# Patient Record
Sex: Female | Born: 1950 | Race: Black or African American | Hispanic: No | Marital: Single | Smoking: Never smoker
Health system: Southern US, Community
[De-identification: ages and names within clinical notes are randomized; demographics above are authoritative.]

## PROBLEM LIST (undated history)

## (undated) DIAGNOSIS — N183 Chronic kidney disease, stage 3 unspecified: Secondary | ICD-10-CM

## (undated) DIAGNOSIS — J302 Other seasonal allergic rhinitis: Secondary | ICD-10-CM

## (undated) DIAGNOSIS — K529 Noninfective gastroenteritis and colitis, unspecified: Secondary | ICD-10-CM

## (undated) DIAGNOSIS — E669 Obesity, unspecified: Secondary | ICD-10-CM

## (undated) DIAGNOSIS — Z951 Presence of aortocoronary bypass graft: Secondary | ICD-10-CM

## (undated) DIAGNOSIS — I2510 Atherosclerotic heart disease of native coronary artery without angina pectoris: Secondary | ICD-10-CM

## (undated) DIAGNOSIS — E119 Type 2 diabetes mellitus without complications: Secondary | ICD-10-CM

## (undated) DIAGNOSIS — I10 Essential (primary) hypertension: Secondary | ICD-10-CM

## (undated) DIAGNOSIS — J45909 Unspecified asthma, uncomplicated: Secondary | ICD-10-CM

## (undated) DIAGNOSIS — I214 Non-ST elevation (NSTEMI) myocardial infarction: Secondary | ICD-10-CM

## (undated) DIAGNOSIS — E8881 Metabolic syndrome: Secondary | ICD-10-CM

## (undated) DIAGNOSIS — E785 Hyperlipidemia, unspecified: Secondary | ICD-10-CM

## (undated) DIAGNOSIS — Z794 Long term (current) use of insulin: Secondary | ICD-10-CM

## (undated) HISTORY — DX: Atherosclerotic heart disease of native coronary artery without angina pectoris: I25.10

## (undated) HISTORY — PX: TONSILLECTOMY: SUR1361

## (undated) HISTORY — DX: Non-ST elevation (NSTEMI) myocardial infarction: I21.4

## (undated) HISTORY — DX: Type 2 diabetes mellitus without complications: Z79.4

## (undated) HISTORY — DX: Type 2 diabetes mellitus without complications: E11.9

## (undated) HISTORY — DX: Essential (primary) hypertension: I10

## (undated) HISTORY — DX: Presence of aortocoronary bypass graft: Z95.1

## (undated) HISTORY — PX: CARDIAC SURGERY: SHX584

## (undated) HISTORY — DX: Obesity, unspecified: E66.9

## (undated) HISTORY — DX: Hyperlipidemia, unspecified: E78.5

## (undated) HISTORY — DX: Metabolic syndrome: E88.81

## (undated) HISTORY — PX: ABDOMINAL HYSTERECTOMY: SHX81

---

## 2009-03-16 ENCOUNTER — Emergency Department (HOSPITAL_COMMUNITY): Admission: EM | Admit: 2009-03-16 | Discharge: 2009-03-16 | Payer: Self-pay | Admitting: Family Medicine

## 2009-05-22 ENCOUNTER — Emergency Department (HOSPITAL_COMMUNITY): Admission: EM | Admit: 2009-05-22 | Discharge: 2009-05-22 | Payer: Self-pay | Admitting: Family Medicine

## 2009-06-17 ENCOUNTER — Emergency Department (HOSPITAL_COMMUNITY): Admission: EM | Admit: 2009-06-17 | Discharge: 2009-06-17 | Payer: Self-pay | Admitting: Emergency Medicine

## 2009-07-04 ENCOUNTER — Emergency Department (HOSPITAL_COMMUNITY): Admission: EM | Admit: 2009-07-04 | Discharge: 2009-07-04 | Payer: Self-pay | Admitting: Emergency Medicine

## 2009-10-01 ENCOUNTER — Emergency Department (HOSPITAL_COMMUNITY): Admission: EM | Admit: 2009-10-01 | Discharge: 2009-10-01 | Payer: Self-pay | Admitting: Emergency Medicine

## 2009-12-05 ENCOUNTER — Emergency Department (HOSPITAL_COMMUNITY): Admission: EM | Admit: 2009-12-05 | Discharge: 2009-12-05 | Payer: Self-pay | Admitting: Emergency Medicine

## 2009-12-29 ENCOUNTER — Emergency Department (HOSPITAL_COMMUNITY): Admission: EM | Admit: 2009-12-29 | Discharge: 2009-12-29 | Payer: Self-pay | Admitting: Family Medicine

## 2010-01-13 ENCOUNTER — Ambulatory Visit: Payer: Self-pay | Admitting: Internal Medicine

## 2010-01-15 ENCOUNTER — Ambulatory Visit: Payer: Self-pay | Admitting: Internal Medicine

## 2010-03-01 ENCOUNTER — Emergency Department (HOSPITAL_COMMUNITY): Admission: EM | Admit: 2010-03-01 | Discharge: 2010-03-01 | Payer: Self-pay | Admitting: Family Medicine

## 2010-04-09 HISTORY — PX: NM MYOCAR PERF WALL MOTION: HXRAD629

## 2010-09-12 ENCOUNTER — Emergency Department (HOSPITAL_COMMUNITY): Admission: EM | Admit: 2010-09-12 | Discharge: 2010-09-12 | Payer: Self-pay | Admitting: Family Medicine

## 2011-01-04 ENCOUNTER — Encounter: Payer: Self-pay | Admitting: Internal Medicine

## 2011-01-15 ENCOUNTER — Inpatient Hospital Stay (INDEPENDENT_AMBULATORY_CARE_PROVIDER_SITE_OTHER)
Admission: RE | Admit: 2011-01-15 | Discharge: 2011-01-15 | Disposition: A | Discharge status: Home / Self Care | Payer: BC Managed Care – PPO | Source: Ambulatory Visit | Attending: Family Medicine | Admitting: Family Medicine

## 2011-01-15 ENCOUNTER — Emergency Department (HOSPITAL_COMMUNITY)
Admission: EM | Admit: 2011-01-15 | Discharge: 2011-01-16 | Disposition: A | Discharge status: Home / Self Care | Payer: BC Managed Care – PPO | Attending: Emergency Medicine | Admitting: Emergency Medicine

## 2011-01-15 DIAGNOSIS — E119 Type 2 diabetes mellitus without complications: Secondary | ICD-10-CM | POA: Insufficient documentation

## 2011-01-15 DIAGNOSIS — R198 Other specified symptoms and signs involving the digestive system and abdomen: Secondary | ICD-10-CM

## 2011-01-15 DIAGNOSIS — I10 Essential (primary) hypertension: Secondary | ICD-10-CM | POA: Insufficient documentation

## 2011-01-15 DIAGNOSIS — K921 Melena: Secondary | ICD-10-CM | POA: Insufficient documentation

## 2011-01-15 DIAGNOSIS — K449 Diaphragmatic hernia without obstruction or gangrene: Secondary | ICD-10-CM | POA: Insufficient documentation

## 2011-01-15 DIAGNOSIS — E7800 Pure hypercholesterolemia, unspecified: Secondary | ICD-10-CM | POA: Insufficient documentation

## 2011-01-15 DIAGNOSIS — R197 Diarrhea, unspecified: Secondary | ICD-10-CM | POA: Insufficient documentation

## 2011-01-15 DIAGNOSIS — R109 Unspecified abdominal pain: Secondary | ICD-10-CM | POA: Insufficient documentation

## 2011-01-15 DIAGNOSIS — R110 Nausea: Secondary | ICD-10-CM | POA: Insufficient documentation

## 2011-01-15 DIAGNOSIS — Z79899 Other long term (current) drug therapy: Secondary | ICD-10-CM | POA: Insufficient documentation

## 2011-01-15 LAB — URINE MICROSCOPIC-ADD ON

## 2011-01-15 LAB — DIFFERENTIAL
Basophils Absolute: 0 10*3/uL (ref 0.0–0.1)
Lymphocytes Relative: 34 % (ref 12–46)
Monocytes Absolute: 1 10*3/uL (ref 0.1–1.0)
Neutro Abs: 7.1 10*3/uL (ref 1.7–7.7)
Neutrophils Relative %: 55 % (ref 43–77)

## 2011-01-15 LAB — URINALYSIS, ROUTINE W REFLEX MICROSCOPIC
Hgb urine dipstick: NEGATIVE
Protein, ur: NEGATIVE mg/dL
Specific Gravity, Urine: 1.022 (ref 1.005–1.030)
Urine Glucose, Fasting: NEGATIVE mg/dL

## 2011-01-15 LAB — CBC
HCT: 37.2 % (ref 36.0–46.0)
MCH: 31.9 pg (ref 26.0–34.0)
MCV: 95.6 fL (ref 78.0–100.0)
RBC: 3.89 MIL/uL (ref 3.87–5.11)
WBC: 12.9 10*3/uL — ABNORMAL HIGH (ref 4.0–10.5)

## 2011-01-16 ENCOUNTER — Emergency Department (HOSPITAL_COMMUNITY): Payer: BC Managed Care – PPO

## 2011-01-16 ENCOUNTER — Encounter (HOSPITAL_COMMUNITY): Payer: Self-pay | Admitting: Radiology

## 2011-01-16 LAB — COMPREHENSIVE METABOLIC PANEL
Albumin: 3.7 g/dL (ref 3.5–5.2)
Alkaline Phosphatase: 92 U/L (ref 39–117)
BUN: 14 mg/dL (ref 6–23)
CO2: 27 mEq/L (ref 19–32)
Chloride: 106 mEq/L (ref 96–112)
Creatinine, Ser: 1.18 mg/dL (ref 0.4–1.2)
GFR calc non Af Amer: 47 mL/min — ABNORMAL LOW (ref >60)
Glucose, Bld: 157 mg/dL — ABNORMAL HIGH (ref 70–99)
Potassium: 4.4 mEq/L (ref 3.5–5.1)
Total Bilirubin: 0.2 mg/dL — ABNORMAL LOW (ref 0.3–1.2)

## 2011-01-16 LAB — LIPASE, BLOOD: Lipase: 36 U/L (ref 11–59)

## 2011-01-16 MED ORDER — IOHEXOL 300 MG/ML  SOLN
100.0000 mL | Freq: Once | INTRAMUSCULAR | Status: AC | PRN
  Administered 2011-01-16: 100 mL via INTRAVENOUS

## 2011-02-26 LAB — POCT URINALYSIS DIPSTICK
Bilirubin Urine: NEGATIVE
Glucose, UA: NEGATIVE mg/dL
Hgb urine dipstick: NEGATIVE
Nitrite: NEGATIVE
Urobilinogen, UA: 0.2 mg/dL (ref 0.0–1.0)

## 2011-03-23 LAB — POCT URINALYSIS DIP (DEVICE)
Glucose, UA: NEGATIVE mg/dL
Hgb urine dipstick: NEGATIVE
Nitrite: NEGATIVE
Urobilinogen, UA: 0.2 mg/dL (ref 0.0–1.0)

## 2011-05-06 ENCOUNTER — Other Ambulatory Visit: Payer: Self-pay | Admitting: Internal Medicine

## 2011-05-06 DIAGNOSIS — Z1231 Encounter for screening mammogram for malignant neoplasm of breast: Secondary | ICD-10-CM

## 2011-05-13 ENCOUNTER — Other Ambulatory Visit (HOSPITAL_COMMUNITY): Payer: Self-pay | Admitting: Orthopedic Surgery

## 2011-05-13 DIAGNOSIS — M25512 Pain in left shoulder: Secondary | ICD-10-CM

## 2011-05-17 ENCOUNTER — Inpatient Hospital Stay (INDEPENDENT_AMBULATORY_CARE_PROVIDER_SITE_OTHER)
Admission: RE | Admit: 2011-05-17 | Discharge: 2011-05-17 | Disposition: A | Discharge status: Home / Self Care | Payer: BC Managed Care – PPO | Source: Ambulatory Visit | Attending: Emergency Medicine | Admitting: Emergency Medicine

## 2011-05-17 DIAGNOSIS — J069 Acute upper respiratory infection, unspecified: Secondary | ICD-10-CM

## 2011-05-22 ENCOUNTER — Other Ambulatory Visit (HOSPITAL_COMMUNITY): Payer: BC Managed Care – PPO

## 2011-05-22 ENCOUNTER — Encounter (HOSPITAL_COMMUNITY): Payer: BC Managed Care – PPO

## 2011-05-26 ENCOUNTER — Ambulatory Visit: Payer: BC Managed Care – PPO

## 2011-05-26 ENCOUNTER — Ambulatory Visit (HOSPITAL_COMMUNITY)
Admission: RE | Admit: 2011-05-26 | Discharge: 2011-05-26 | Disposition: A | Discharge status: Home / Self Care | Payer: BC Managed Care – PPO | Source: Ambulatory Visit | Attending: Orthopedic Surgery | Admitting: Orthopedic Surgery

## 2011-05-26 ENCOUNTER — Encounter (HOSPITAL_COMMUNITY)
Admission: RE | Admit: 2011-05-26 | Discharge: 2011-05-26 | Disposition: A | Discharge status: Home / Self Care | Payer: BC Managed Care – PPO | Source: Ambulatory Visit | Attending: Orthopedic Surgery | Admitting: Orthopedic Surgery

## 2011-05-26 DIAGNOSIS — M25512 Pain in left shoulder: Secondary | ICD-10-CM

## 2011-05-26 DIAGNOSIS — M25519 Pain in unspecified shoulder: Secondary | ICD-10-CM | POA: Insufficient documentation

## 2011-05-26 MED ORDER — TECHNETIUM TC 99M MEDRONATE IV KIT
25.0000 | PACK | Freq: Once | INTRAVENOUS | Status: AC | PRN
  Administered 2011-05-26: 25 via INTRAVENOUS

## 2011-05-29 ENCOUNTER — Ambulatory Visit
Admission: RE | Admit: 2011-05-29 | Discharge: 2011-05-29 | Disposition: A | Discharge status: Home / Self Care | Payer: BC Managed Care – PPO | Source: Ambulatory Visit | Attending: Internal Medicine | Admitting: Internal Medicine

## 2011-05-29 ENCOUNTER — Ambulatory Visit: Payer: BC Managed Care – PPO

## 2011-05-29 DIAGNOSIS — Z1231 Encounter for screening mammogram for malignant neoplasm of breast: Secondary | ICD-10-CM

## 2012-05-05 ENCOUNTER — Other Ambulatory Visit: Payer: Self-pay | Admitting: Internal Medicine

## 2012-05-05 DIAGNOSIS — Z1231 Encounter for screening mammogram for malignant neoplasm of breast: Secondary | ICD-10-CM

## 2012-06-01 ENCOUNTER — Ambulatory Visit
Admission: RE | Admit: 2012-06-01 | Discharge: 2012-06-01 | Disposition: A | Discharge status: Home / Self Care | Payer: BC Managed Care – PPO | Source: Ambulatory Visit | Attending: Internal Medicine | Admitting: Internal Medicine

## 2012-06-01 DIAGNOSIS — Z1231 Encounter for screening mammogram for malignant neoplasm of breast: Secondary | ICD-10-CM

## 2013-01-14 ENCOUNTER — Emergency Department (HOSPITAL_COMMUNITY)
Admission: EM | Admit: 2013-01-14 | Discharge: 2013-01-14 | Disposition: A | Discharge status: Home / Self Care | Payer: BC Managed Care – PPO | Attending: Emergency Medicine | Admitting: Emergency Medicine

## 2013-01-14 ENCOUNTER — Encounter (HOSPITAL_COMMUNITY): Payer: Self-pay | Admitting: *Deleted

## 2013-01-14 DIAGNOSIS — R21 Rash and other nonspecific skin eruption: Secondary | ICD-10-CM | POA: Insufficient documentation

## 2013-01-14 DIAGNOSIS — E119 Type 2 diabetes mellitus without complications: Secondary | ICD-10-CM | POA: Insufficient documentation

## 2013-01-14 DIAGNOSIS — L299 Pruritus, unspecified: Secondary | ICD-10-CM | POA: Insufficient documentation

## 2013-01-14 DIAGNOSIS — R0602 Shortness of breath: Secondary | ICD-10-CM | POA: Insufficient documentation

## 2013-01-14 DIAGNOSIS — T4995XA Adverse effect of unspecified topical agent, initial encounter: Secondary | ICD-10-CM | POA: Insufficient documentation

## 2013-01-14 DIAGNOSIS — Z862 Personal history of diseases of the blood and blood-forming organs and certain disorders involving the immune mechanism: Secondary | ICD-10-CM | POA: Insufficient documentation

## 2013-01-14 DIAGNOSIS — I10 Essential (primary) hypertension: Secondary | ICD-10-CM | POA: Insufficient documentation

## 2013-01-14 DIAGNOSIS — Z8639 Personal history of other endocrine, nutritional and metabolic disease: Secondary | ICD-10-CM | POA: Insufficient documentation

## 2013-01-14 DIAGNOSIS — Z90710 Acquired absence of both cervix and uterus: Secondary | ICD-10-CM | POA: Insufficient documentation

## 2013-01-14 DIAGNOSIS — T783XXA Angioneurotic edema, initial encounter: Secondary | ICD-10-CM | POA: Insufficient documentation

## 2013-01-14 DIAGNOSIS — T7840XA Allergy, unspecified, initial encounter: Secondary | ICD-10-CM | POA: Insufficient documentation

## 2013-01-14 MED ORDER — EPINEPHRINE HCL 1 MG/ML IJ SOLN
1.0000 mg | Freq: Once | INTRAMUSCULAR | Status: AC
  Administered 2013-01-14: 1 mg via INTRAMUSCULAR
  Filled 2013-01-14: qty 1

## 2013-01-14 MED ORDER — DIPHENHYDRAMINE HCL 25 MG PO TABS: 25.0000 mg | ORAL_TABLET | Freq: Four times a day (QID) | ORAL | Status: DC

## 2013-01-14 MED ORDER — DIPHENHYDRAMINE HCL 50 MG/ML IJ SOLN
50.0000 mg | Freq: Once | INTRAMUSCULAR | Status: AC
Start: 2013-01-14 — End: 2013-01-14
  Administered 2013-01-14: 50 mg via INTRAVENOUS
  Filled 2013-01-14: qty 1

## 2013-01-14 MED ORDER — PREDNISONE 20 MG PO TABS: 60.0000 mg | ORAL_TABLET | Freq: Every day | ORAL | Status: DC

## 2013-01-14 MED ORDER — FAMOTIDINE 20 MG PO TABS: 20.0000 mg | ORAL_TABLET | Freq: Two times a day (BID) | ORAL | Status: DC

## 2013-01-14 MED ORDER — METHYLPREDNISOLONE SODIUM SUCC 125 MG IJ SOLR
125.0000 mg | Freq: Once | INTRAMUSCULAR | Status: AC
  Administered 2013-01-14: 125 mg via INTRAVENOUS
  Filled 2013-01-14: qty 2

## 2013-01-14 MED ORDER — EPINEPHRINE 0.3 MG/0.3ML IJ DEVI: 0.3000 mg | INTRAMUSCULAR | Status: DC | PRN

## 2013-01-14 MED ORDER — FAMOTIDINE IN NACL 20-0.9 MG/50ML-% IV SOLN
20.0000 mg | Freq: Once | INTRAVENOUS | Status: AC
  Administered 2013-01-14: 20 mg via INTRAVENOUS
  Filled 2013-01-14: qty 50

## 2013-01-14 NOTE — ED Notes (Signed)
Pt. Swelling to her face is significantly reduced from when she arrived. She was able to drink a ginger ale without problems swallowing. She does still have a small amount of angioedema noted to her lower lip but markedly improved from when she arrived.

## 2013-01-14 NOTE — ED Notes (Addendum)
C/o sudden onset of sob around 1600, gradual progressive L sided mouth swelling around 2000. Also c/o rash and itching. Hives noted. Alert, NAD, calm, interactive, speech altered d/t swelling. LS CTA. Difficult to visualize oropahynx/uvula. Cap refill <3sec. No benadryl PTA. Did use inhaler PTA. Husband in w/r. Takes lisinopril.

## 2013-01-14 NOTE — ED Notes (Signed)
Pt. Has noted angioedema to her face and lips. States she feels like her throat might be tightening up on her. She has a red raised rash on both forearms, her neck, her chest, back and her genitalia. She states it itches. She denies using anything new or using anything that she would be allergic too.

## 2013-01-14 NOTE — ED Provider Notes (Addendum)
History     CSN: [id number]  Arrival date & time 01/14/13  0006   First MD Initiated Contact with Patient 01/14/13 0020      Chief Complaint  Patient presents with  . Angioedema  . Shortness of Breath  . Pruritis    (Consider location/radiation/quality/duration/timing/severity/associated sxs/prior treatment) HPI Pt brought to the ED by husband, she reports onset of itching rash to arms and chest about 5 hours prior to arrival which progressed to include swelling of her L upper lip, lower lip and tongue. She denies any SOB. No new exposures to meds, soaps, lotions or foods. She thinks she had a milder reaction to mushrooms previously, but has not had any mushrooms today.  Past Medical History  Diagnosis Date  . Diabetes mellitus   . Hypertension   . Hypercholesterolemia     Past Surgical History  Procedure Date  . Abdominal hysterectomy     No family history on file.  History  Substance Use Topics  . Smoking status: Never Smoker   . Smokeless tobacco: Not on file  . Alcohol Use: No    OB History    Grav Para Term Preterm Abortions TAB SAB Ect Mult Living                  Review of Systems All other systems reviewed and are negative except as noted in HPI.   Allergies  Tetanus toxoids  Home Medications  No current outpatient prescriptions on file.  BP 157/86  Pulse 134  Resp 20  SpO2 97%  Physical Exam  Nursing note and vitals reviewed. Constitutional: She is oriented to person, place, and time. She appears well-developed and well-nourished.  HENT:  Head: Normocephalic and atraumatic.       Angioedema of the L upper and lower lips and L tongue  Eyes: EOM are normal. Pupils are equal, round, and reactive to light.  Neck: Normal range of motion. Neck supple.  Cardiovascular: Normal rate, normal heart sounds and intact distal pulses.   Pulmonary/Chest: Effort normal and breath sounds normal. No stridor.  Abdominal: Bowel sounds are normal. She exhibits  no distension. There is no tenderness.  Musculoskeletal: Normal range of motion. She exhibits no edema and no tenderness.  Neurological: She is alert and oriented to person, place, and time. She has normal strength. No cranial nerve deficit or sensory deficit.  Skin: Skin is warm and dry. Rash (hives to arms and chest) noted.  Psychiatric: She has a normal mood and affect.    ED Course  Procedures (including critical care time)  Labs Reviewed - No data to display No results found.   No diagnosis found.    MDM  Allergic reaction to unknown allergen. Doubt this is due to lisinopril due to presence of cutaneous reaction. Will give IV meds and EpiPen   2:40 AM Pt sleeping comfortably. Still having some angioedema of lips, but no stridor and improved hives. Will continue to monitor.    4:52 AM Angioedema and rash improved, BP and SpO2 remain normal 4hrs post-EpiPen. No stridor Pt feeling better and is ready to go home.   CRITICAL CARE Performed by: Pollyann Savoy   Total critical care time: 45  Critical care time was exclusive of separately billable procedures and treating other patients.  Critical care was necessary to treat or prevent imminent or life-threatening deterioration.  Critical care was time spent personally by me on the following activities: development of treatment plan with patient and/or surrogate as  well as nursing, discussions with consultants, evaluation of patient's response to treatment, examination of patient, obtaining history from patient or surrogate, ordering and performing treatments and interventions, ordering and review of laboratory studies, ordering and review of radiographic studies, pulse oximetry and re-evaluation of patient's condition.   Charles B. Bernette Mayers, MD 01/14/13 [phone]

## 2013-01-15 ENCOUNTER — Inpatient Hospital Stay (HOSPITAL_COMMUNITY)
Admission: EM | Admit: 2013-01-15 | Discharge: 2013-01-18 | DRG: 582 | Disposition: A | Discharge status: Home / Self Care | Payer: BC Managed Care – PPO | Attending: Internal Medicine | Admitting: Internal Medicine

## 2013-01-15 ENCOUNTER — Encounter (HOSPITAL_COMMUNITY): Payer: Self-pay | Admitting: Emergency Medicine

## 2013-01-15 DIAGNOSIS — N289 Disorder of kidney and ureter, unspecified: Secondary | ICD-10-CM | POA: Diagnosis present

## 2013-01-15 DIAGNOSIS — J309 Allergic rhinitis, unspecified: Secondary | ICD-10-CM | POA: Diagnosis present

## 2013-01-15 DIAGNOSIS — T783XXA Angioneurotic edema, initial encounter: Secondary | ICD-10-CM

## 2013-01-15 DIAGNOSIS — Z888 Allergy status to other drugs, medicaments and biological substances status: Secondary | ICD-10-CM

## 2013-01-15 DIAGNOSIS — Z79899 Other long term (current) drug therapy: Secondary | ICD-10-CM

## 2013-01-15 DIAGNOSIS — I10 Essential (primary) hypertension: Secondary | ICD-10-CM | POA: Diagnosis present

## 2013-01-15 DIAGNOSIS — Z90710 Acquired absence of both cervix and uterus: Secondary | ICD-10-CM

## 2013-01-15 DIAGNOSIS — E785 Hyperlipidemia, unspecified: Secondary | ICD-10-CM | POA: Diagnosis present

## 2013-01-15 DIAGNOSIS — I9589 Other hypotension: Secondary | ICD-10-CM | POA: Diagnosis present

## 2013-01-15 DIAGNOSIS — N179 Acute kidney failure, unspecified: Secondary | ICD-10-CM | POA: Diagnosis present

## 2013-01-15 DIAGNOSIS — IMO0001 Reserved for inherently not codable concepts without codable children: Secondary | ICD-10-CM | POA: Diagnosis present

## 2013-01-15 DIAGNOSIS — E1165 Type 2 diabetes mellitus with hyperglycemia: Secondary | ICD-10-CM | POA: Diagnosis present

## 2013-01-15 DIAGNOSIS — Y92009 Unspecified place in unspecified non-institutional (private) residence as the place of occurrence of the external cause: Secondary | ICD-10-CM

## 2013-01-15 DIAGNOSIS — IMO0002 Reserved for concepts with insufficient information to code with codable children: Secondary | ICD-10-CM | POA: Diagnosis present

## 2013-01-15 DIAGNOSIS — I959 Hypotension, unspecified: Secondary | ICD-10-CM | POA: Diagnosis present

## 2013-01-15 DIAGNOSIS — D649 Anemia, unspecified: Secondary | ICD-10-CM | POA: Diagnosis present

## 2013-01-15 DIAGNOSIS — Z887 Allergy status to serum and vaccine status: Secondary | ICD-10-CM

## 2013-01-15 DIAGNOSIS — E860 Dehydration: Secondary | ICD-10-CM | POA: Diagnosis present

## 2013-01-15 DIAGNOSIS — J302 Other seasonal allergic rhinitis: Secondary | ICD-10-CM | POA: Insufficient documentation

## 2013-01-15 DIAGNOSIS — T465X5A Adverse effect of other antihypertensive drugs, initial encounter: Secondary | ICD-10-CM | POA: Diagnosis present

## 2013-01-15 DIAGNOSIS — T886XXA Anaphylactic reaction due to adverse effect of correct drug or medicament properly administered, initial encounter: Secondary | ICD-10-CM | POA: Diagnosis present

## 2013-01-15 DIAGNOSIS — Z23 Encounter for immunization: Secondary | ICD-10-CM

## 2013-01-15 HISTORY — DX: Other seasonal allergic rhinitis: J30.2

## 2013-01-15 LAB — CBC WITH DIFFERENTIAL/PLATELET
Basophils Absolute: 0 10*3/uL (ref 0.0–0.1)
Eosinophils Absolute: 0.1 10*3/uL (ref 0.0–0.7)
Eosinophils Relative: 1 % (ref 0–5)
Lymphocytes Relative: 30 % (ref 12–46)
MCH: 32.3 pg (ref 26.0–34.0)
MCV: 95.5 fL (ref 78.0–100.0)
Neutrophils Relative %: 65 % (ref 43–77)
Platelets: 261 10*3/uL (ref 150–400)
RDW: 12.9 % (ref 11.5–15.5)
WBC: 12 10*3/uL — ABNORMAL HIGH (ref 4.0–10.5)

## 2013-01-15 LAB — COMPREHENSIVE METABOLIC PANEL
ALT: 23 U/L (ref 0–35)
AST: 26 U/L (ref 0–37)
Calcium: 8.8 mg/dL (ref 8.4–10.5)
Sodium: 134 mEq/L — ABNORMAL LOW (ref 135–145)
Total Protein: 6.4 g/dL (ref 6.0–8.3)

## 2013-01-15 MED ORDER — DIPHENHYDRAMINE HCL 12.5 MG/5ML PO ELIX
50.0000 mg | ORAL_SOLUTION | Freq: Once | ORAL | Status: AC
  Administered 2013-01-15: 50 mg via ORAL
  Filled 2013-01-15: qty 20

## 2013-01-15 MED ORDER — SODIUM CHLORIDE 0.9 % IV BOLUS (SEPSIS)
2000.0000 mL | Freq: Once | INTRAVENOUS | Status: AC
  Administered 2013-01-15: 2000 mL via INTRAVENOUS

## 2013-01-15 MED ORDER — PREDNISONE 20 MG PO TABS
60.0000 mg | ORAL_TABLET | Freq: Once | ORAL | Status: AC
  Administered 2013-01-15: 60 mg via ORAL
  Filled 2013-01-15: qty 3

## 2013-01-15 NOTE — ED Notes (Addendum)
Patient reports that she was seen here on Friday for rash and swelling; patient was told that she was probably having an allergic reaction to Lisinopril.  Large red rashes noted to arms; patient reports that rash is all over her body and is really itchy.  Patient also complaining of headache only while coughing.  Patient reports clear sputum production.  Patient reports that she has not been taking her prescriptions prescribed from last visit on regular basis.  HR in 130's in triage; blood pressure 85/59.  Patient denies chest pain, shortness of breath, dizziness, and weakness.

## 2013-01-15 NOTE — ED Notes (Signed)
Blood pressure rechecked three times; systolic pressure between 70-90 with all readings; EDP notified.

## 2013-01-15 NOTE — ED Notes (Signed)
Pt has visible rash all over body red in color and pt is hot to touch. Pt states she was here Friday for the same rash and it has gotten worse. Pt denies sob. C/o severe itching.

## 2013-01-15 NOTE — ED Provider Notes (Addendum)
History     CSN: [id number]  Arrival date & time 01/15/13  2134   First MD Initiated Contact with Patient 01/15/13 2235      Chief Complaint  Patient presents with  . Rash  . Cough    (Consider location/radiation/quality/duration/timing/severity/associated sxs/prior treatment) HPI Presents with diffuse swelling, rash, and cough generalized weakness onset 2 days ago. Patient reports that 2 days ago her tongue and lip were swollen.. Seen here 01/14/2013. Treated with intravenous medication and epinephrine. Observed and discharged home. Continued to take lisinopril the following day. Rash is worsening. Now is more pronounced on trunk and extremities. No longer has swelling of tongue or lips Denies other complaint. No further treatment at home. Past Medical History  Diagnosis Date  . Diabetes mellitus   . Hypertension   . Hypercholesterolemia   . Seasonal allergies     Past Surgical History  Procedure Date  . Abdominal hysterectomy     History reviewed. No pertinent family history.  History  Substance Use Topics  . Smoking status: Never Smoker   . Smokeless tobacco: Not on file  . Alcohol Use: No    OB History    Grav Para Term Preterm Abortions TAB SAB Ect Mult Living                  Review of Systems  HENT: Negative.   Respiratory: Positive for cough.   Cardiovascular: Negative.   Gastrointestinal: Negative.   Musculoskeletal: Negative.   Skin: Positive for rash.  Neurological: Positive for weakness.  Hematological: Negative.   Psychiatric/Behavioral: Negative.   All other systems reviewed and are negative.    Allergies  Mushroom extract complex and Tetanus toxoids  Home Medications   Current Outpatient Rx  Name  Route  Sig  Dispense  Refill  . ALBUTEROL SULFATE HFA 108 (90 BASE) MCG/ACT IN AERS   Inhalation   Inhale 2 puffs into the lungs every 6 (six) hours as needed. For shortness of breath or wheezing         . ADVAIR DISKUS IN    Inhalation   Inhale 1 puff into the lungs daily as needed. For wheezing         . GLIPIZIDE 10 MG PO TABS   Oral   Take 10 mg by mouth 2 (two) times daily before a meal.         . LISINOPRIL 20 MG PO TABS   Oral   Take 20 mg by mouth every evening.         [name] Kitchen LORATADINE 10 MG PO TABS   Oral   Take 10 mg by mouth daily.         [name] Kitchen METFORMIN HCL 500 MG PO TABS   Oral   Take 500 mg by mouth 2 (two) times daily with a meal.         . ADULT MULTIVITAMIN W/MINERALS CH   Oral   Take 1 tablet by mouth daily.         [name] Kitchen OVER THE COUNTER MEDICATION   Oral   Take 1 tablet by mouth daily. Ordered online - medicine to speed up metabolism         . SIMVASTATIN 10 MG PO TABS   Oral   Take 10 mg by mouth at bedtime.         [name] Kitchen PREDNISONE 20 MG PO TABS   Oral   Take 3 tablets (60 mg total) by mouth daily.   15 tablet  0     BP 85/59  Pulse 120  Temp 99.3 F (37.4 C) (Oral)  Resp 20  SpO2 95%  Physical Exam  Nursing note and vitals reviewed. Constitutional: She appears well-developed and well-nourished.  HENT:  Head: Normocephalic and atraumatic.  Right Ear: External ear normal.  Left Ear: External ear normal.       No swelling of tongue lips or uvula. No hoarseness  Eyes: Conjunctivae normal are normal. Pupils are equal, round, and reactive to light.  Neck: Neck supple. No tracheal deviation present. No thyromegaly present.  Cardiovascular: Normal rate and regular rhythm.   No murmur heard. Pulmonary/Chest: Effort normal and breath sounds normal.  Abdominal: Soft. Bowel sounds are normal. She exhibits no distension. There is no tenderness.       Obese  Musculoskeletal: Normal range of motion. She exhibits no edema and no tenderness.  Neurological: She is alert. Coordination normal.  Skin: Skin is warm and dry. Rash noted.       Hive-like rash on trunk and extremities.  Psychiatric: She has a normal mood and affect.    ED Course  Procedures (including  critical care time)   Labs Reviewed  CBC WITH DIFFERENTIAL  COMPREHENSIVE METABOLIC PANEL   No results found.   No diagnosis found.    Date: 01/15/2013  Rate: 130  Rhythm: sinus tachycardia  QRS Axis: left  Intervals: normal  ST/T Wave abnormalities: nonspecific T wave changes  Conduction Disutrbances:none  Narrative Interpretation:   Old EKG Reviewed: none available 12:40 AM patient remained hypotensive after treatment with 2 liters normal saline iv. She is itching diffusely after treatment prednisone and Benadryl. She is alert appropriate  Results for orders placed during the hospital encounter of 01/15/13  CBC WITH DIFFERENTIAL      Component Value Range   WBC 12.0 (*) 4.0 - 10.5 K/uL   RBC 4.64  3.87 - 5.11 MIL/uL   Hemoglobin 15.0  12.0 - 15.0 g/dL   HCT 09.8  11.9 - 14.7 %   MCV 95.5  78.0 - 100.0 fL   MCH 32.3  26.0 - 34.0 pg   MCHC 33.9  30.0 - 36.0 g/dL   RDW 82.9  56.2 - 13.0 %   Platelets 261  150 - 400 K/uL   Neutrophils Relative 65  43 - 77 %   Neutro Abs 7.8 (*) 1.7 - 7.7 K/uL   Lymphocytes Relative 30  12 - 46 %   Lymphs Abs 3.6  0.7 - 4.0 K/uL   Monocytes Relative 4  3 - 12 %   Monocytes Absolute 0.5  0.1 - 1.0 K/uL   Eosinophils Relative 1  0 - 5 %   Eosinophils Absolute 0.1  0.0 - 0.7 K/uL   Basophils Relative 0  0 - 1 %   Basophils Absolute 0.0  0.0 - 0.1 K/uL  COMPREHENSIVE METABOLIC PANEL      Component Value Range   Sodium 134 (*) 135 - 145 mEq/L   Potassium 4.0  3.5 - 5.1 mEq/L   Chloride 104  96 - 112 mEq/L   CO2 16 (*) 19 - 32 mEq/L   Glucose, Bld 351 (*) 70 - 99 mg/dL   BUN 27 (*) 6 - 23 mg/dL   Creatinine, Ser 8.65 (*) 0.50 - 1.10 mg/dL   Calcium 8.8  8.4 - 78.4 mg/dL   Total Protein 6.4  6.0 - 8.3 g/dL   Albumin 3.3 (*) 3.5 - 5.2 g/dL   AST  26  0 - 37 U/L   ALT 23  0 - 35 U/L   Alkaline Phosphatase 61  39 - 117 U/L   Total Bilirubin 0.4  0.3 - 1.2 mg/dL   GFR calc non Af Amer 31 (*) >90 mL/min   GFR calc Af Amer 36 (*) >90  mL/min   No results found. Old charts reviewed MDM  Patient will be placed since that down unit for supportive care Suspect angioedema. Lisinopril possible culprit Diagnosis#1angioedema #2 hyperglycemia #3rena insufficiency #4hypotension CRITICAL CARE Performed by: Doug Sou   Total critical care time: 30 minute  Critical care time was exclusive of separately billable procedures and treating other patients.  Critical care was necessary to treat or prevent imminent or life-threatening deterioration.  Critical care was time spent personally by me on the following activities: development of treatment plan with patient and/or surrogate as well as nursing, discussions with consultants, evaluation of patient's response to treatment, examination of patient, obtaining history from patient or surrogate, ordering and performing treatments and interventions, ordering and review of laboratory studies, ordering and review of radiographic studies, pulse oximetry and re-evaluation of patient's condition. Doug Sou, MD 01/16/13 1610  Doug Sou, MD 01/16/13 9604  Doug Sou, MD 01/16/13 [phone]

## 2013-01-16 DIAGNOSIS — I10 Essential (primary) hypertension: Secondary | ICD-10-CM | POA: Diagnosis present

## 2013-01-16 DIAGNOSIS — I959 Hypotension, unspecified: Secondary | ICD-10-CM | POA: Diagnosis present

## 2013-01-16 DIAGNOSIS — T783XXA Angioneurotic edema, initial encounter: Principal | ICD-10-CM

## 2013-01-16 DIAGNOSIS — T886XXA Anaphylactic reaction due to adverse effect of correct drug or medicament properly administered, initial encounter: Secondary | ICD-10-CM | POA: Diagnosis present

## 2013-01-16 DIAGNOSIS — N289 Disorder of kidney and ureter, unspecified: Secondary | ICD-10-CM | POA: Diagnosis present

## 2013-01-16 DIAGNOSIS — E1165 Type 2 diabetes mellitus with hyperglycemia: Secondary | ICD-10-CM | POA: Diagnosis present

## 2013-01-16 HISTORY — DX: Anaphylactic reaction due to adverse effect of correct drug or medicament properly administered, initial encounter: T88.6XXA

## 2013-01-16 LAB — GLUCOSE, CAPILLARY
Glucose-Capillary: 330 mg/dL — ABNORMAL HIGH (ref 70–99)
Glucose-Capillary: 337 mg/dL — ABNORMAL HIGH (ref 70–99)
Glucose-Capillary: 435 mg/dL — ABNORMAL HIGH (ref 70–99)
Glucose-Capillary: 338 mg/dL — ABNORMAL HIGH (ref 70–99)

## 2013-01-16 LAB — BASIC METABOLIC PANEL
Calcium: 7.6 mg/dL — ABNORMAL LOW (ref 8.4–10.5)
GFR calc Af Amer: 41 mL/min — ABNORMAL LOW (ref >90)
GFR calc non Af Amer: 35 mL/min — ABNORMAL LOW (ref >90)
Glucose, Bld: 407 mg/dL — ABNORMAL HIGH (ref 70–99)
Potassium: 4.9 mEq/L (ref 3.5–5.1)
Sodium: 133 mEq/L — ABNORMAL LOW (ref 135–145)

## 2013-01-16 LAB — HEMOGLOBIN A1C
Hgb A1c MFr Bld: 9.3 % — ABNORMAL HIGH (ref <5.7)
Mean Plasma Glucose: 220 mg/dL — ABNORMAL HIGH (ref <117)

## 2013-01-16 LAB — LIPID PANEL
Cholesterol: 81 mg/dL (ref 0–200)
Total CHOL/HDL Ratio: 2.6 RATIO

## 2013-01-16 LAB — MRSA PCR SCREENING: MRSA by PCR: NEGATIVE

## 2013-01-16 MED ORDER — INSULIN ASPART 100 UNIT/ML ~~LOC~~ SOLN: 0.0000 [IU] | Freq: Every day | SUBCUTANEOUS | Status: DC

## 2013-01-16 MED ORDER — ACETAMINOPHEN 325 MG PO TABS: 650.0000 mg | ORAL_TABLET | Freq: Four times a day (QID) | ORAL | Status: DC | PRN

## 2013-01-16 MED ORDER — SODIUM CHLORIDE 0.9 % IV SOLN
INTRAVENOUS | Status: DC
  Administered 2013-01-16: 1000 mL via INTRAVENOUS

## 2013-01-16 MED ORDER — LORATADINE 10 MG PO TABS
10.0000 mg | ORAL_TABLET | Freq: Every day | ORAL | Status: DC
  Administered 2013-01-16 – 2013-01-18 (×3): 10 mg via ORAL
  Filled 2013-01-16 (×3): qty 1

## 2013-01-16 MED ORDER — ONDANSETRON HCL 4 MG PO TABS: 4.0000 mg | ORAL_TABLET | Freq: Four times a day (QID) | ORAL | Status: DC | PRN

## 2013-01-16 MED ORDER — MOMETASONE FURO-FORMOTEROL FUM 100-5 MCG/ACT IN AERO
2.0000 | INHALATION_SPRAY | Freq: Two times a day (BID) | RESPIRATORY_TRACT | Status: DC
  Administered 2013-01-16 – 2013-01-18 (×5): 2 via RESPIRATORY_TRACT
  Filled 2013-01-16: qty 8.8

## 2013-01-16 MED ORDER — SODIUM CHLORIDE 0.9 % IV SOLN
INTRAVENOUS | Status: DC
  Administered 2013-01-16 – 2013-01-17 (×3): via INTRAVENOUS

## 2013-01-16 MED ORDER — DIPHENHYDRAMINE HCL 50 MG/ML IJ SOLN: 12.5000 mg | Freq: Four times a day (QID) | INTRAMUSCULAR | Status: DC | PRN

## 2013-01-16 MED ORDER — PREDNISONE 50 MG PO TABS
50.0000 mg | ORAL_TABLET | Freq: Every day | ORAL | Status: DC
  Administered 2013-01-16 – 2013-01-18 (×3): 50 mg via ORAL
  Filled 2013-01-16 (×4): qty 1

## 2013-01-16 MED ORDER — SODIUM CHLORIDE 0.9 % IV SOLN: INTRAVENOUS | Status: DC

## 2013-01-16 MED ORDER — ENOXAPARIN SODIUM 40 MG/0.4ML ~~LOC~~ SOLN
40.0000 mg | SUBCUTANEOUS | Status: DC
  Administered 2013-01-16 – 2013-01-17 (×2): 40 mg via SUBCUTANEOUS
  Filled 2013-01-16 (×3): qty 0.4

## 2013-01-16 MED ORDER — ADULT MULTIVITAMIN W/MINERALS CH
1.0000 | ORAL_TABLET | Freq: Every day | ORAL | Status: DC
  Administered 2013-01-16 – 2013-01-18 (×3): 1 via ORAL
  Filled 2013-01-16 (×3): qty 1

## 2013-01-16 MED ORDER — ALBUTEROL SULFATE HFA 108 (90 BASE) MCG/ACT IN AERS: 2.0000 | INHALATION_SPRAY | Freq: Four times a day (QID) | RESPIRATORY_TRACT | Status: DC | PRN

## 2013-01-16 MED ORDER — METFORMIN HCL 500 MG PO TABS: 500.0000 mg | ORAL_TABLET | Freq: Two times a day (BID) | ORAL | Status: DC

## 2013-01-16 MED ORDER — INSULIN ASPART 100 UNIT/ML ~~LOC~~ SOLN
0.0000 [IU] | Freq: Every day | SUBCUTANEOUS | Status: DC
  Administered 2013-01-16: 4 [IU] via SUBCUTANEOUS
  Administered 2013-01-17: 2 [IU] via SUBCUTANEOUS

## 2013-01-16 MED ORDER — SODIUM CHLORIDE 0.9 % IV BOLUS (SEPSIS)
250.0000 mL | Freq: Once | INTRAVENOUS | Status: AC
  Administered 2013-01-16: 250 mL via INTRAVENOUS

## 2013-01-16 MED ORDER — INSULIN DETEMIR 100 UNIT/ML ~~LOC~~ SOLN
24.0000 [IU] | Freq: Every day | SUBCUTANEOUS | Status: DC
  Administered 2013-01-16 – 2013-01-17 (×2): 24 [IU] via SUBCUTANEOUS
  Filled 2013-01-16: qty 10

## 2013-01-16 MED ORDER — ALBUTEROL SULFATE (5 MG/ML) 0.5% IN NEBU
2.5000 mg | INHALATION_SOLUTION | RESPIRATORY_TRACT | Status: DC | PRN
Start: 2013-01-16 — End: 2013-01-18

## 2013-01-16 MED ORDER — SODIUM CHLORIDE 0.9 % IJ SOLN
3.0000 mL | Freq: Two times a day (BID) | INTRAMUSCULAR | Status: DC
  Administered 2013-01-16: 3 mL via INTRAVENOUS

## 2013-01-16 MED ORDER — INSULIN DETEMIR 100 UNIT/ML ~~LOC~~ SOLN
18.0000 [IU] | Freq: Every day | SUBCUTANEOUS | Status: DC
  Filled 2013-01-16: qty 10

## 2013-01-16 MED ORDER — INSULIN ASPART 100 UNIT/ML ~~LOC~~ SOLN
0.0000 [IU] | Freq: Three times a day (TID) | SUBCUTANEOUS | Status: DC
  Administered 2013-01-16 (×2): 20 [IU] via SUBCUTANEOUS
  Administered 2013-01-17 (×2): 11 [IU] via SUBCUTANEOUS
  Administered 2013-01-17: 20 [IU] via SUBCUTANEOUS
  Administered 2013-01-18: 4 [IU] via SUBCUTANEOUS

## 2013-01-16 MED ORDER — ONDANSETRON HCL 4 MG/2ML IJ SOLN: 4.0000 mg | Freq: Four times a day (QID) | INTRAMUSCULAR | Status: DC | PRN

## 2013-01-16 MED ORDER — DIPHENHYDRAMINE HCL 25 MG PO CAPS
25.0000 mg | ORAL_CAPSULE | Freq: Four times a day (QID) | ORAL | Status: DC
  Administered 2013-01-16 – 2013-01-18 (×8): 25 mg via ORAL
  Filled 2013-01-16 (×16): qty 1

## 2013-01-16 MED ORDER — GLIPIZIDE 10 MG PO TABS
10.0000 mg | ORAL_TABLET | Freq: Two times a day (BID) | ORAL | Status: DC
  Administered 2013-01-16 – 2013-01-18 (×4): 10 mg via ORAL
  Filled 2013-01-16 (×6): qty 1

## 2013-01-16 MED ORDER — SIMVASTATIN 10 MG PO TABS
10.0000 mg | ORAL_TABLET | Freq: Every day | ORAL | Status: DC
  Administered 2013-01-16 – 2013-01-17 (×2): 10 mg via ORAL
  Filled 2013-01-16 (×3): qty 1

## 2013-01-16 MED ORDER — INSULIN ASPART 100 UNIT/ML ~~LOC~~ SOLN
0.0000 [IU] | Freq: Three times a day (TID) | SUBCUTANEOUS | Status: DC
  Administered 2013-01-16: 7 [IU] via SUBCUTANEOUS

## 2013-01-16 MED ORDER — FAMOTIDINE 20 MG PO TABS
20.0000 mg | ORAL_TABLET | Freq: Two times a day (BID) | ORAL | Status: DC
  Administered 2013-01-16 – 2013-01-18 (×5): 20 mg via ORAL
  Filled 2013-01-16 (×6): qty 1

## 2013-01-16 MED ORDER — FLUTICASONE-SALMETEROL 250-50 MCG/DOSE IN AEPB: 1.0000 | INHALATION_SPRAY | Freq: Two times a day (BID) | RESPIRATORY_TRACT | Status: DC

## 2013-01-16 NOTE — Progress Notes (Signed)
[facility] Progress Note [place] TEAM 1 - Stepdown/ICU TEAM   Giannamarie Paulus ZOX:[id number] DOB: 1951-03-23 DOA: 01/15/2013 PCP: Dorrene German, MD  Brief narrative: 62 year old patient -past medical history of diabetes and hypertension. Presented to the emergency department on 01/14/2013 with urticarial rash associated with angioedema of the tongue and lips. Was treated for an allergic reaction of unknown etiology. Presented back to the emergency department on 01/15/2013 because of persistent allergic reaction symptoms. She had been given IV steroids in the emergency department and because she was hemodynamically and respiratory stable she was discharged home with an EpiPen. When she read presented she was no longer had any angioedema of the face but had significant cutaneous rash which has not improved. She was also continuing to take her usual ACE inhibitor and the admitting physician felt this was the likely culprit to her allergic reaction so this was discontinued at admission. Because of her borderline blood pressure in the hypertensive range she was subsequently admitted to the step down unit.  Assessment/Plan:  Anaphylactic reaction, due to adverse effect of correct medicinal substance properly administered -Suspected secondary to ACE inhibitor so have permanently discontinued -Continue high-dose prednisone and will continue scheduled Benadryl with H2 blocker -Will need to follow up w/Allergist after dc-already has Epipen  Hypotension -Likely related to recent allergic reaction and subsequent histamine response -Initiate IV fluids (see below) -Blood pressure low but consistently greater than 90  Acute renal insufficiency/Dehydration -BUN likely up from steroids but also could be indicative of volume depletion and hypoperfusion from recent allergic reaction -Continue IV fluids -Electrolytes in a.m.  Diabetes mellitus type 2, uncontrolled, without complications -CBGs  persistently greater than 300 on steroid -Resume home Glucotrol -Metformin on hold due to to acute renal insufficiency -Increase sliding scale insulin to resistant  HTN (hypertension) -Blood pressure soft so antihypertensive medications on hold  DVT prophylaxis: Lovenox Code Status: Full Family Communication: Patient Disposition Plan: Floor  Consultants: None  Procedures: None  Antibiotics: None  HPI/Subjective: Alert without any complaints of shortness of breath or difficulty swallowing. Previous pruritus controlled. Patient reports rash resolved.   Objective: Blood pressure 107/69, pulse 96, temperature 97.5 F (36.4 C), temperature source Oral, resp. rate 24, weight 93.1 kg (205 lb 4 oz), SpO2 98.00%.  Intake/Output Summary (Last 24 hours) at 01/16/13 1238 Last data filed at 01/16/13 1000  Gross per 24 hour  Intake    875 ml  Output      0 ml  Net    875 ml     Exam: Follow up exam completed  Data Reviewed: Basic Metabolic Panel:  Lab 01/16/13 8119 01/15/13 2211  NA 133* 134*  K 4.9 4.0  CL 106 104  CO2 18* 16*  GLUCOSE 407* 351*  BUN 25* 27*  CREATININE 1.53* 1.71*  CALCIUM 7.6* 8.8  MG -- --  PHOS -- --   Liver Function Tests:  Lab 01/15/13 2211  AST 26  ALT 23  ALKPHOS 61  BILITOT 0.4  PROT 6.4  ALBUMIN 3.3*   CBC:  Lab 01/15/13 2211  WBC 12.0*  NEUTROABS 7.8*  HGB 15.0  HCT 44.3  MCV 95.5  PLT 261   CBG:  Lab 01/16/13 1123 01/16/13 0824 01/16/13 0240  GLUCAP 435* 337* 330*    Recent Results (from the past 240 hour(s))  MRSA PCR SCREENING     Status: Normal   Collection Time   01/16/13  2:56 AM      Component Value Range Status Comment  MRSA by PCR NEGATIVE  NEGATIVE Final      Studies:  Recent x-ray studies have been reviewed in detail by the Attending Physician  Scheduled Meds:  Reviewed in detail by the Attending Physician   Junious Silk, ANP [facility] Office  [phone] Pager  [phone]  On-Call/Text Page:      [email or web address]      password [email or web address]  If 7PM-7AM, please contact night-coverage [email or web address] Password [email or web address] 01/16/2013, 12:38 PM   LOS: 1 day   I have personally examined this patient and reviewed the entire database. I have reviewed the above note, made any necessary editorial changes, and agree with its content.  Lonia Blood, MD [facility]

## 2013-01-16 NOTE — H&P (Signed)
History and Physical  Cheryl Gill ZOX:[id number] DOB: [birth year] DOA: 01/15/2013  Referring physician: Dr. Ethelda Chick  PCP: Dorrene German, MD   Chief Complaint: worsening of Rash and hypotension  HPI:62 year old woman with past medical history significant for DM, HTN presents to the ER with worsening rash. She was seen in the ER - 2 days ago for similar complaints of generalized body rash, swelling of tongue and lips , cough that was attributed to allergic reaction to unknown allergen. She was treated with steroids and IV medications and sent back home. She continued to take lisinopril the following day and now returns with worsening rash , more pronounced on trunk and extremities. No longer has swelling of tongue or lips. Was also found to be hypotensive. Denies other complaint. No further treatment at home. Given that patient's BP was on the low side (which is unusual for her) it was decided to admit the patient.   Review of Systems:  HENT: Negative.  Respiratory: Positive for cough.  Cardiovascular: Negative.  Gastrointestinal: Negative.  Musculoskeletal: Negative.  Skin: Positive for rash.  Neurological: Positive for weakness.  Hematological: Negative.  Psychiatric/Behavioral: Negative.  All other systems reviewed and are negative   Past Medical History  Diagnosis Date  . Diabetes mellitus   . Hypertension   . Hypercholesterolemia   . Seasonal allergies     Past Surgical History  Procedure Date  . Abdominal hysterectomy     Social History:  reports that she has never smoked. She does not have any smokeless tobacco history on file. She reports that she does not drink alcohol or use illicit drugs.  Allergies  Allergen Reactions  . Ace Inhibitors Anaphylaxis  . Mushroom Extract Complex Swelling  . Tetanus Toxoids Nausea And Vomiting and Swelling    History reviewed. No pertinent family history.   Prior to Admission medications   Medication Sig Start Date End  Date Taking? Authorizing Provider  albuterol (PROVENTIL HFA;VENTOLIN HFA) 108 (90 BASE) MCG/ACT inhaler Inhale 2 puffs into the lungs every 6 (six) hours as needed. For shortness of breath or wheezing   Yes Historical Provider, MD  Fluticasone-Salmeterol (ADVAIR DISKUS IN) Inhale 1 puff into the lungs daily as needed. For wheezing   Yes Historical Provider, MD  glipiZIDE (GLUCOTROL) 10 MG tablet Take 10 mg by mouth 2 (two) times daily before a meal.   Yes Historical Provider, MD  lisinopril (PRINIVIL,ZESTRIL) 20 MG tablet Take 20 mg by mouth every evening.   Yes Historical Provider, MD  loratadine (CLARITIN) 10 MG tablet Take 10 mg by mouth daily.   Yes Historical Provider, MD  metFORMIN (GLUCOPHAGE) 500 MG tablet Take 500 mg by mouth 2 (two) times daily with a meal.   Yes Historical Provider, MD  Multiple Vitamin (MULTIVITAMIN WITH MINERALS) TABS Take 1 tablet by mouth daily.   Yes Historical Provider, MD  OVER THE COUNTER MEDICATION Take 1 tablet by mouth daily. Ordered online - medicine to speed up metabolism   Yes Historical Provider, MD  simvastatin (ZOCOR) 10 MG tablet Take 10 mg by mouth at bedtime.   Yes Historical Provider, MD  predniSONE (DELTASONE) 20 MG tablet Take 3 tablets (60 mg total) by mouth daily. 01/14/13   Charles B. Bernette Mayers, MD   Physical Exam: Filed Vitals:   01/15/13 2146 01/16/13 0000 01/16/13 0030  BP: 85/59 98/57 92/59   Pulse: 120 103 102  Temp: 99.3 F (37.4 C)    TempSrc: Oral    Resp: 20  SpO2: 95% 97% 97%     General:  Alert, cooperative, NAD  Eyes: PERRLA, EOMI, no scleral icterus or pallor  Neck: supple , no lymphadenopathy  Cardiovascular: Tachycardia, regular rhythm, no murmur , rubs or gallop  Respiratory: normal respiratory effort, no rales, rhonchi or wheezing.   Abdomen: soft, non - tender, positive bowel sounds, no organomegaly  Skin: maculopapular rash present all over the body, more prominent on arms and chest  Musculoskeletal: moving  all four extremities  Psychiatric: She has normal mood and affect  Neurologic: Grossly normal.     Labs on Admission:  Basic Metabolic Panel:  Lab 01/15/13 1610  NA 134*  K 4.0  CL 104  CO2 16*  GLUCOSE 351*  BUN 27*  CREATININE 1.71*  CALCIUM 8.8  MG --  PHOS --    Liver Function Tests:  Lab 01/15/13 2211  AST 26  ALT 23  ALKPHOS 61  BILITOT 0.4  PROT 6.4  ALBUMIN 3.3*    CBC:  Lab 01/15/13 2211  WBC 12.0*  NEUTROABS 7.8*  HGB 15.0  HCT 44.3  MCV 95.5  PLT 261       EKG: sinus tachycardia , HR ~ 140 bpm, left axis deviation, no acute ST- T wave changes   Principal Problem:  *Anaphylactic reaction, due to adverse effect of correct medicinal substance properly administered Active Problems:  Hypotension  Acute renal insufficiency   Assessment/Plan  # Anaphylactic reaction: likely 2/2 lisinopril . She was also found to be hypotensive. Admit to step down bed for observation overnight Continue steroids at 50 mg daily. Benadryl  IVF  Continue home inhaler- albuterol and adviar.  Epi- pen at d/c.   # Hypotension: see #1 IVF and monitor in Stepdown  # Leucocytosis: likely stress de margination vs steroid use.  Continue to monitor  # Acute renal failure: likely pre- renal 2/2 decreased intake. IVF D/C lisinopril Check BMET in AM.   # DM: Check Hb AIC. SSI Hold glipizide and metformin  # HLD: check FLP. Continue zocor.   Code Status: Full Family Communication: Patient and the family at the bedside were updated on the plan of care.  Disposition Plan/Anticipated LOS: pending until further improvement of rash. Likely in 2-3 days.   Time spent: 70 minutes  Lars Mage, MD  [facility] Team 10  If 7PM-7AM, please contact night-coverage at [email or web address], password [email or web address] 01/16/2013, 1:34 AM

## 2013-01-16 NOTE — Progress Notes (Signed)
Patient transferred to [facility] room 28. Alert and oriented. No resp distress. No c/o's itching. Oriented to room. Call bell in reach.

## 2013-01-16 NOTE — Progress Notes (Signed)
CBG >400 (407). Dr. Sharon Seller notified. Orders received.

## 2013-01-17 ENCOUNTER — Encounter (HOSPITAL_COMMUNITY): Payer: Self-pay | Admitting: General Practice

## 2013-01-17 DIAGNOSIS — T782XXA Anaphylactic shock, unspecified, initial encounter: Secondary | ICD-10-CM

## 2013-01-17 DIAGNOSIS — N289 Disorder of kidney and ureter, unspecified: Secondary | ICD-10-CM

## 2013-01-17 DIAGNOSIS — I10 Essential (primary) hypertension: Secondary | ICD-10-CM

## 2013-01-17 LAB — GLUCOSE, CAPILLARY
Glucose-Capillary: 271 mg/dL — ABNORMAL HIGH (ref 70–99)
Glucose-Capillary: 286 mg/dL — ABNORMAL HIGH (ref 70–99)
Glucose-Capillary: 224 mg/dL — ABNORMAL HIGH (ref 70–99)

## 2013-01-17 LAB — BASIC METABOLIC PANEL
BUN: 23 mg/dL (ref 6–23)
Chloride: 113 mEq/L — ABNORMAL HIGH (ref 96–112)
GFR calc non Af Amer: 44 mL/min — ABNORMAL LOW (ref >90)
Glucose, Bld: 349 mg/dL — ABNORMAL HIGH (ref 70–99)
Potassium: 4.6 mEq/L (ref 3.5–5.1)
Sodium: 141 mEq/L (ref 135–145)

## 2013-01-17 MED ORDER — INFLUENZA VIRUS VACC SPLIT PF IM SUSP
0.5000 mL | INTRAMUSCULAR | Status: DC
  Filled 2013-01-17: qty 0.5

## 2013-01-17 MED ORDER — PNEUMOCOCCAL VAC POLYVALENT 25 MCG/0.5ML IJ INJ
0.5000 mL | INJECTION | INTRAMUSCULAR | Status: DC
  Filled 2013-01-17: qty 0.5

## 2013-01-17 MED ORDER — METFORMIN HCL 500 MG PO TABS
500.0000 mg | ORAL_TABLET | Freq: Two times a day (BID) | ORAL | Status: DC
  Administered 2013-01-17 – 2013-01-18 (×3): 500 mg via ORAL
  Filled 2013-01-17 (×5): qty 1

## 2013-01-17 NOTE — Progress Notes (Signed)
[facility] Progress Note    Shayleigh Bouldin WUJ:[id number] DOB: [birth year] DOA: 01/15/2013 PCP: Dorrene German, MD  Brief narrative: 62 year old patient -past medical history of diabetes and hypertension. Presented to the emergency department on 01/14/2013 with urticarial rash associated with angioedema of the tongue and lips. Was treated for an allergic reaction of unknown etiology. Presented back to the emergency department on 01/15/2013 because of persistent allergic reaction symptoms. She had been given IV steroids in the emergency department and because she was hemodynamically and respiratory stable she was discharged home with an EpiPen. When she re presented she  no longer had any angioedema of the face but had significant cutaneous rash which has not improved. She was also continuing to take her usual ACE inhibitor and the admitting physician felt this was the likely culprit to her allergic reaction so this was discontinued at admission. Because of her borderline blood pressure in the hypertensive range she was subsequently admitted to the step down unit.  Assessment/Plan:  Anaphylactic reaction, due to adverse effect of correct medicinal substance properly administered -Suspected secondary to ACE inhibitor so have permanently discontinued -Continue high-dose prednisone today and can considering tapering in the am. -Will continue scheduled Benadryl with H2 blocker -Will need to follow up w/Allergist after dc-already has Epipen  Hypotension -Likely related to recent allergic reaction and subsequent histamine response -Initiate IV fluids (see below) -Blood pressure low but consistently greater than 90 -Continue IVF today.  Acute renal insufficiency/Dehydration -BUN likely up from steroids but also could be indicative of volume depletion and hypoperfusion from recent allergic reaction -Continue IV fluids -Cr improving.  Diabetes mellitus type 2, uncontrolled, without  complications -CBGs persistently greater than 300 on steroid -Resume home Glucotrol -Metformin on hold due to to acute renal insufficiency -Increase sliding scale insulin to resistant  HTN (hypertension) -Blood pressure soft so antihypertensive medications on hold  DVT prophylaxis: Lovenox Code Status: Full Family Communication: Patient Disposition Plan: Possibly home in am.  Consultants: None  Procedures: None  Antibiotics: None  HPI/Subjective: Alert without any complaints of shortness of breath or difficulty swallowing. Previous pruritus controlled. Patient reports rash resolved. Very pleasant and cooperative with exam.   Objective: Blood pressure 143/85, pulse 92, temperature 97.8 F (36.6 C), temperature source Oral, resp. rate 18, height 5\' 1"  (1.549 m), weight 90.719 kg (200 lb), SpO2 95.00%.  Intake/Output Summary (Last 24 hours) at 01/17/13 1755 Last data filed at 01/17/13 1355  Gross per 24 hour  Intake   2516 ml  Output      0 ml  Net   2516 ml     Exam: Follow up exam completed  Data Reviewed: Basic Metabolic Panel:  Lab 01/17/13 9562 01/16/13 1824 01/16/13 0505 01/15/13 2211  NA 141 -- 133* 134*  K 4.6 -- 4.9 4.0  CL 113* -- 106 104  CO2 20 -- 18* 16*  GLUCOSE 349* 433* 407* 351*  BUN 23 -- 25* 27*  CREATININE 1.28* -- 1.53* 1.71*  CALCIUM 7.5* -- 7.6* 8.8  MG -- -- -- --  PHOS -- -- -- --   Liver Function Tests:  Lab 01/15/13 2211  AST 26  ALT 23  ALKPHOS 61  BILITOT 0.4  PROT 6.4  ALBUMIN 3.3*   CBC:  Lab 01/15/13 2211  WBC 12.0*  NEUTROABS 7.8*  HGB 15.0  HCT 44.3  MCV 95.5  PLT 261   CBG:  Lab 01/17/13 1654 01/17/13 1201 01/17/13 0729 01/16/13 2140 01/16/13 1633  GLUCAP 373* 286*  271* 338* 407*    Recent Results (from the past 240 hour(s))  MRSA PCR SCREENING     Status: Normal   Collection Time   01/16/13  2:56 AM      Component Value Range Status Comment   MRSA by PCR NEGATIVE  NEGATIVE Final        Peggye Pitt, MD [facility] Pager: [phone]   01/17/2013, 5:55 PM   LOS: 2 days

## 2013-01-18 DIAGNOSIS — D649 Anemia, unspecified: Secondary | ICD-10-CM | POA: Diagnosis present

## 2013-01-18 LAB — CBC
MCH: 31.4 pg (ref 26.0–34.0)
MCHC: 33.1 g/dL (ref 30.0–36.0)
Platelets: 184 10*3/uL (ref 150–400)
RBC: 3.22 MIL/uL — ABNORMAL LOW (ref 3.87–5.11)

## 2013-01-18 LAB — BASIC METABOLIC PANEL
BUN: 20 mg/dL (ref 6–23)
Calcium: 8 mg/dL — ABNORMAL LOW (ref 8.4–10.5)
GFR calc Af Amer: 57 mL/min — ABNORMAL LOW (ref >90)
GFR calc non Af Amer: 50 mL/min — ABNORMAL LOW (ref >90)
Glucose, Bld: 212 mg/dL — ABNORMAL HIGH (ref 70–99)
Potassium: 4.4 mEq/L (ref 3.5–5.1)
Sodium: 142 mEq/L (ref 135–145)

## 2013-01-18 LAB — GLUCOSE, CAPILLARY: Glucose-Capillary: 173 mg/dL — ABNORMAL HIGH (ref 70–99)

## 2013-01-18 MED ORDER — PREDNISONE (PAK) 10 MG PO TABS: ORAL_TABLET | ORAL | Status: DC

## 2013-01-18 MED ORDER — DIPHENHYDRAMINE HCL 25 MG PO CAPS: 25.0000 mg | ORAL_CAPSULE | ORAL | Status: DC | PRN

## 2013-01-18 MED ORDER — MUCINEX DM 30-600 MG PO TB12: 1.0000 | ORAL_TABLET | Freq: Two times a day (BID) | ORAL | Status: DC

## 2013-01-18 NOTE — Discharge Summary (Addendum)
Physician Discharge Summary  Cheryl Gill ZOX:[id number] DOB: 10/19/1951 DOA: 01/15/2013  PCP: Dorrene German, MD  Admit date: 01/15/2013 Discharge date: 01/18/2013  Recommendations for Outpatient Follow-up:  1. Recommend outpatient followup with an allergist for allergy testing. 2. Recommend close followup for a recheck of blood pressure. 3. Recommend close followup for evaluation of uncontrolled diabetes given her hemoglobin A1c of 9.3%. 4. Recommend outpatient followup of anemia.  Discharge Diagnoses:  Principal Problem:  *Anaphylactic reaction, due to adverse effect of correct medicinal substance properly administered Active Problems:   Hypotension   Acute renal insufficiency   Dehydration   HTN (hypertension)   Diabetes mellitus type 2, uncontrolled, without complications   Normocytic anemia   Discharge Condition: Improved.  Diet recommendation:  Low-sodium, heart healthy, carbohydrate modified.  History of present illness:  62 year old patient -past medical history of diabetes and hypertension. Presented to the emergency department on 01/14/2013 with urticarial rash associated with angioedema of the tongue and lips. Was treated for an allergic reaction of unknown etiology. Presented back to the emergency department on 01/15/2013 because of persistent allergic reaction symptoms. She had been given IV steroids in the emergency department and because she was hemodynamically and respiratory stable she was discharged home with an EpiPen. When she re presented she no longer had any angioedema of the face but had significant cutaneous rash which has not improved. She was also continuing to take her usual ACE inhibitor and the admitting physician felt this was the likely culprit to her allergic reaction so this was discontinued at admission. Because of her borderline blood pressure in the hypertensive range she was subsequently admitted to the step down unit.  Hospital Course by  problem:  Anaphylactic reaction, due to adverse effect of correct medicinal substance properly administered  -Suspected secondary to ACE inhibitor so have permanently discontinued. -Continue high-dose prednisone today and can considering tapering in the am.  -Treated with scheduled Benadryl with H2 blocker. Can discontinue H2 blocker on discharge and continue Benadryl as   needed for pruritus. -Will need to follow up w/Allergist after dc-already has Epipen.  Hypotension  -Likely related to recent allergic reaction and subsequent histamine response. -Treated with IV fluids. Blood pressure now stable. Acute renal insufficiency/Dehydration  -Creatinine improved with IV fluids.  Diabetes mellitus type 2, uncontrolled, without complications  -Resume metformin at discharge. Elevated glucoses reflective of steroid use. Should improve with taper. -Recommend close followup with primary care physician for reassessment of glycemic control. Hemoglobin A1c 9.3% HTN (hypertension)  -Resume antihypertensives at discharge. Normocytic anemia -Drop in hemoglobin likely dilutional. Recommend followup with PCP for further evaluation.  Procedures:  None.  Consultations:  None.  Discharge Exam: Filed Vitals:   01/18/13 0600  BP: 133/77  Pulse: 73  Temp: 98.3 F (36.8 C)  Resp: 20   Filed Vitals:   01/17/13 1004 01/17/13 1354 01/17/13 2100 01/18/13 0600  BP: 119/74 143/85 114/71 133/77  Pulse: 100 92 85 73  Temp: 97.9 F (36.6 C) 97.8 F (36.6 C) 98.3 F (36.8 C) 98.3 F (36.8 C)  TempSrc: Oral Oral Oral Oral  Resp: 18 18 20 20   Height: 5\' 1"  (1.549 m)     Weight: 90.719 kg (200 lb)     SpO2: 95% 95% 100% 98%    Gen:  NAD Skin: Resolving rash. Cardiovascular:  RRR, No M/R/G Respiratory: Lungs CTAB Gastrointestinal: Abdomen soft, NT/ND with normal active bowel sounds. Extremities: No C/E/C   Discharge Instructions      Discharge Orders  Future Orders Please Complete By  Expires   Diet - low sodium heart healthy      Diet Carb Modified      Activity as tolerated - No restrictions      Call MD for:  hives      Call MD for:      Scheduling Instructions:   Recurrent swelling of the face, lips, or tongue, difficulty breathing, or worsening rash.       Medication List     As of 01/18/2013  9:19 AM    STOP taking these medications         lisinopril 20 MG tablet   Commonly known as: PRINIVIL,ZESTRIL      predniSONE 20 MG tablet   Commonly known as: DELTASONE      TAKE these medications         ADVAIR DISKUS IN   Inhale 1 puff into the lungs daily as needed. For wheezing      albuterol 108 (90 BASE) MCG/ACT inhaler   Commonly known as: PROVENTIL HFA;VENTOLIN HFA   Inhale 2 puffs into the lungs every 6 (six) hours as needed. For shortness of breath or wheezing      diphenhydrAMINE 25 mg capsule   Commonly known as: BENADRYL   Take 1 capsule (25 mg total) by mouth every 4 (four) hours as needed for itching.      glipiZIDE 10 MG tablet   Commonly known as: GLUCOTROL   Take 10 mg by mouth 2 (two) times daily before a meal.      loratadine 10 MG tablet   Commonly known as: CLARITIN   Take 10 mg by mouth daily.      metFORMIN 500 MG tablet   Commonly known as: GLUCOPHAGE   Take 500 mg by mouth 2 (two) times daily with a meal.      multivitamin with minerals Tabs   Take 1 tablet by mouth daily.      OVER THE COUNTER MEDICATION   Take 1 tablet by mouth daily. Ordered online - medicine to speed up metabolism      predniSONE 10 MG tablet   Commonly known as: STERAPRED UNI-PAK   Take 6 tablets 01/20/12 and decrease by 10 mg daily until off.      simvastatin 10 MG tablet   Commonly known as: ZOCOR   Take 10 mg by mouth at bedtime.         Follow-up Information    Follow up with [name],[name], MD. Schedule an appointment as soon as possible for a visit in 1 week. [facility] followup)    Contact information:   [address] [place] [address] [phone]       Follow up with An allergist of your choice. Schedule an appointment as soon as possible for a visit in 2 weeks. (For allergy testing.)           The results of significant diagnostics from this hospitalization (including imaging, microbiology, ancillary and laboratory) are listed below for reference.    Significant Diagnostic Studies: No results found.  Microbiology: Recent Results (from the past 240 hour(s))  MRSA PCR SCREENING     Status: Normal   Collection Time   01/16/13  2:56 AM      Component Value Range Status Comment   MRSA by PCR NEGATIVE  NEGATIVE Final      Labs:  Basic Metabolic Panel:  Lab 01/18/13 2130 01/17/13 0545 01/16/13 1824 01/16/13 0505 01/15/13 2211  NA 142 141 -- 133* 134*  K 4.4 4.6 -- -- --  CL 114* 113* -- 106 104  CO2 19 20 -- 18* 16*  GLUCOSE 212* 349* 433* 407* 351*  BUN 20 23 -- 25* 27*  CREATININE 1.15* 1.28* -- 1.53* 1.71*  CALCIUM 8.0* 7.5* -- 7.6* 8.8  MG -- -- -- -- --  PHOS -- -- -- -- --   GFR Estimated Creatinine Clearance: 51.4 ml/min (by C-G formula based on Cr of 1.15). Liver Function Tests:  Lab 01/15/13 2211  AST 26  ALT 23  ALKPHOS 61  BILITOT 0.4  PROT 6.4  ALBUMIN 3.3*   CBC:  Lab 01/18/13 0610 01/15/13 2211  WBC 10.7* 12.0*  NEUTROABS -- 7.8*  HGB 10.1* 15.0  HCT 30.5* 44.3  MCV 94.7 95.5  PLT 184 261   CBG:  Lab 01/18/13 0740 01/17/13 2153 01/17/13 1654 01/17/13 1201 01/17/13 0729  GLUCAP 173* 224* 373* 286* 271*   Hgb A1c  Basename 01/16/13 0505  HGBA1C 9.3*   Lipid Profile  Basename 01/16/13 0505  CHOL 81  HDL 31*  LDLCALC 36  TRIG 70  CHOLHDL 2.6  LDLDIRECT --   Microbiology Recent Results (from the past 240 hour(s))  MRSA PCR SCREENING     Status: Normal   Collection Time   01/16/13  2:56 AM      Component Value Range Status Comment   MRSA by PCR NEGATIVE  NEGATIVE Final     Time coordinating discharge: 35  minutes.  Signed:  Bryceton Hantz  Pager [phone] [facility] 01/18/2013, 9:19 AM

## 2013-01-27 ENCOUNTER — Emergency Department (INDEPENDENT_AMBULATORY_CARE_PROVIDER_SITE_OTHER)
Admission: EM | Admit: 2013-01-27 | Discharge: 2013-01-27 | Disposition: A | Discharge status: Home / Self Care | Payer: BC Managed Care – PPO | Source: Home / Self Care | Attending: Family Medicine | Admitting: Family Medicine

## 2013-01-27 ENCOUNTER — Encounter (HOSPITAL_COMMUNITY): Payer: Self-pay | Admitting: *Deleted

## 2013-01-27 DIAGNOSIS — I10 Essential (primary) hypertension: Secondary | ICD-10-CM

## 2013-01-27 MED ORDER — HYDROCHLOROTHIAZIDE 25 MG PO TABS: 25.0000 mg | ORAL_TABLET | Freq: Every day | ORAL | Status: DC

## 2013-01-27 NOTE — ED Notes (Signed)
Patient states she was in hospital last week due to allergy to Lisinopril and has not had medication in past week. She is now having dizziness and fatigue with elevated blood pressure. She was instructed have records faxed to Dr. Dione Plover.

## 2013-01-27 NOTE — ED Provider Notes (Signed)
History     CSN: [id number]  Arrival date & time 01/27/13  4098   First MD Initiated Contact with Patient 01/27/13 2001      Chief Complaint  Patient presents with  . Hypertension    (Consider location/radiation/quality/duration/timing/severity/associated sxs/prior treatment) HPI Comments: Pt reports was in hospital until about 1.5 weeks ago because of allergic reaction to lisinopril.  Was d/c from hospital on no htn meds and was supposed to f/u with pcp.  Has appt next week.  In the meantime has been monitoring bp at home and it has been getting higher. Pt also reports feeling dizzy and tired at times which is what happens when her bp is high.  Requests new medicine for bp until she can get to appt with pcp next week.   Patient is a 62 y.o. female presenting with hypertension. The history is provided by the patient.  Hypertension This is a chronic problem. Episode onset: 5 days ago. The problem occurs constantly. The problem has been gradually worsening. Pertinent negatives include no chest pain, no headaches and no shortness of breath. Nothing aggravates the symptoms. Nothing relieves the symptoms. She has tried nothing for the symptoms.    Past Medical History  Diagnosis Date  . Diabetes mellitus   . Hypertension   . Hypercholesterolemia   . Seasonal allergies     Past Surgical History  Procedure Laterality Date  . Abdominal hysterectomy      History reviewed. No pertinent family history.  History  Substance Use Topics  . Smoking status: Never Smoker   . Smokeless tobacco: Never Used  . Alcohol Use: No    OB History   Grav Para Term Preterm Abortions TAB SAB Ect Mult Living                  Review of Systems  Constitutional: Positive for fatigue. Negative for fever and chills.  Respiratory: Negative for shortness of breath.   Cardiovascular: Negative for chest pain, palpitations and leg swelling.  Neurological: Positive for dizziness. Negative for headaches.     Allergies  Ace inhibitors; Lisinopril; Mushroom extract complex; and Tetanus toxoids  Home Medications   Current Outpatient Rx  Name  Route  Sig  Dispense  Refill  . albuterol (PROVENTIL HFA;VENTOLIN HFA) 108 (90 BASE) MCG/ACT inhaler   Inhalation   Inhale 2 puffs into the lungs every 6 (six) hours as needed. For shortness of breath or wheezing         . Dextromethorphan-Guaifenesin (MUCINEX DM) 30-600 MG TB12   Oral   Take 1 tablet by mouth 2 (two) times daily.   28 each   0   . diphenhydrAMINE (BENADRYL) 25 mg capsule   Oral   Take 1 capsule (25 mg total) by mouth every 4 (four) hours as needed for itching.   30 capsule   0   . Fluticasone-Salmeterol (ADVAIR DISKUS IN)   Inhalation   Inhale 1 puff into the lungs daily as needed. For wheezing         . glipiZIDE (GLUCOTROL) 10 MG tablet   Oral   Take 10 mg by mouth 2 (two) times daily before a meal.         . hydrochlorothiazide (HYDRODIURIL) 25 MG tablet   Oral   Take 1 tablet (25 mg total) by mouth daily.   30 tablet   0   . loratadine (CLARITIN) 10 MG tablet   Oral   Take 10 mg by mouth daily.         [name] Kitchen  metFORMIN (GLUCOPHAGE) 500 MG tablet   Oral   Take 500 mg by mouth 2 (two) times daily with a meal.         . Multiple Vitamin (MULTIVITAMIN WITH MINERALS) TABS   Oral   Take 1 tablet by mouth daily.         [name] Kitchen OVER THE COUNTER MEDICATION   Oral   Take 1 tablet by mouth daily. Ordered online - medicine to speed up metabolism         . predniSONE (STERAPRED UNI-PAK) 10 MG tablet      Take 6 tablets 01/20/12 and decrease by 10 mg daily until off.   21 tablet   0   . simvastatin (ZOCOR) 10 MG tablet   Oral   Take 10 mg by mouth at bedtime.           BP 148/97  Pulse 99  Temp(Src) 98.6 F (37 C) (Oral)  Resp 18  SpO2 97%  Physical Exam  Constitutional: She is oriented to person, place, and time. She appears well-developed and well-nourished. No distress.  Cardiovascular:  Normal rate and regular rhythm.   Pulmonary/Chest: Effort normal and breath sounds normal.  Neurological: She is alert and oriented to person, place, and time.    ED Course  Procedures (including critical care time)  Labs Reviewed - No data to display No results found.   1. Hypertension       MDM  Pt to monitor bp and sx at home. If develops concerning sx as discussed advised to go to ER.          Cathlyn Parsons, NP 01/27/13 2045

## 2013-01-27 NOTE — ED Notes (Signed)
Notes faxed to Dr. Concepcion Elk per patient's request.

## 2013-01-28 NOTE — ED Provider Notes (Signed)
Medical screening examination/treatment/procedure(s) were performed by non-physician practitioner and as supervising physician I was immediately available for consultation/collaboration.   MORENO-COLL,Alizae Bechtel; MD  Sumaya Riedesel Moreno-Coll, MD 01/28/13 0916 

## 2013-12-16 ENCOUNTER — Emergency Department (HOSPITAL_COMMUNITY)
Admission: EM | Admit: 2013-12-16 | Discharge: 2013-12-16 | Disposition: A | Discharge status: Home / Self Care | Payer: No Typology Code available for payment source | Source: Home / Self Care

## 2013-12-16 ENCOUNTER — Encounter (HOSPITAL_COMMUNITY): Payer: Self-pay | Admitting: Emergency Medicine

## 2013-12-16 DIAGNOSIS — J069 Acute upper respiratory infection, unspecified: Secondary | ICD-10-CM

## 2013-12-16 DIAGNOSIS — R0982 Postnasal drip: Secondary | ICD-10-CM

## 2013-12-16 MED ORDER — PHENYLEPH-CPM-DM-APAP 5-2-10-250 MG PO TBEF: EFFERVESCENT_TABLET | ORAL | Status: DC

## 2013-12-16 NOTE — ED Provider Notes (Signed)
CSN: [id number]     Arrival date & time 12/16/13  4098 History   First MD Initiated Contact with Patient 12/16/13 1013     Chief Complaint  Patient presents with  . Cough   (Consider location/radiation/quality/duration/timing/severity/associated sxs/prior Treatment) HPI Comments: 63 year old female having chest and head congestion for 2-3 weeks. He is complaining of copious amount of PND. Denies fever or facial pain. She is not taking medications for her symptoms.  Patient is a 63 y.o. female presenting with cough.  Cough Associated symptoms: ear pain, rhinorrhea and sore throat   Associated symptoms: no chills, no fever, no rash, no shortness of breath and no wheezing     Past Medical History  Diagnosis Date  . Diabetes mellitus   . Hypertension   . Hypercholesterolemia   . Seasonal allergies    Past Surgical History  Procedure Laterality Date  . Abdominal hysterectomy     No family history on file. History  Substance Use Topics  . Smoking status: Never Smoker   . Smokeless tobacco: Never Used  . Alcohol Use: No   OB History   Grav Para Term Preterm Abortions TAB SAB Ect Mult Living                 Review of Systems  Constitutional: Negative for fever, chills, activity change, appetite change and fatigue.  HENT: Positive for congestion, ear pain, postnasal drip, rhinorrhea and sore throat. Negative for facial swelling.   Eyes: Negative.   Respiratory: Positive for cough. Negative for shortness of breath and wheezing.   Cardiovascular: Negative.   Musculoskeletal: Negative for neck pain and neck stiffness.  Skin: Negative for pallor and rash.  Neurological: Negative.     Allergies  Ace inhibitors; Lisinopril; Mushroom extract complex; and Tetanus toxoids  Home Medications   Current Outpatient Rx  Name  Route  Sig  Dispense  Refill  . albuterol (PROVENTIL HFA;VENTOLIN HFA) 108 (90 BASE) MCG/ACT inhaler   Inhalation   Inhale 2 puffs into the lungs every 6 (six)  hours as needed. For shortness of breath or wheezing         . aspirin 81 MG tablet   Oral   Take 81 mg by mouth daily.         [name] Kitchen Dextromethorphan-Guaifenesin (MUCINEX DM) 30-600 MG TB12   Oral   Take 1 tablet by mouth 2 (two) times daily.   28 each   0   . Fluticasone-Salmeterol (ADVAIR DISKUS IN)   Inhalation   Inhale 1 puff into the lungs daily as needed. For wheezing         . glipiZIDE (GLUCOTROL) 10 MG tablet   Oral   Take 10 mg by mouth 2 (two) times daily before a meal.         . hydrochlorothiazide (HYDRODIURIL) 25 MG tablet   Oral   Take 1 tablet (25 mg total) by mouth daily.   30 tablet   0   . loratadine (CLARITIN) 10 MG tablet   Oral   Take 10 mg by mouth daily.         . metFORMIN (GLUCOPHAGE) 500 MG tablet   Oral   Take 500 mg by mouth 2 (two) times daily with a meal.         . Multiple Vitamin (MULTIVITAMIN WITH MINERALS) TABS   Oral   Take 1 tablet by mouth daily.         . simvastatin (ZOCOR) 10 MG tablet  Oral   Take 10 mg by mouth at bedtime.         . diphenhydrAMINE (BENADRYL) 25 mg capsule   Oral   Take 1 capsule (25 mg total) by mouth every 4 (four) hours as needed for itching.   30 capsule   0   . OVER THE COUNTER MEDICATION   Oral   Take 1 tablet by mouth daily. Ordered online - medicine to speed up metabolism         . Phenyleph-CPM-DM-APAP 04-14-09-250 MG TBEF      1 po q 4 h prn cough and cold   1 tablet   0    BP 110/69  Pulse 18  Temp(Src) 98 F (36.7 C) (Oral)  Resp 18  SpO2 97% Physical Exam  Nursing note and vitals reviewed. Constitutional: She is oriented to person, place, and time. She appears well-developed and well-nourished. No distress.  HENT:  Mouth/Throat: No oropharyngeal exudate.  Bilateral TMs are normal Oropharynx with cobblestoning minor erythema and thin clear to pale green PND.  Eyes: Conjunctivae and EOM are normal.  Neck: Normal range of motion. Neck supple.  Cardiovascular:  Normal rate, regular rhythm and normal heart sounds.   Pulmonary/Chest: Effort normal and breath sounds normal. No respiratory distress. She has no wheezes. She has no rales.  Musculoskeletal: Normal range of motion. She exhibits no edema.  Lymphadenopathy:    She has no cervical adenopathy.  Neurological: She is alert and oriented to person, place, and time.  Skin: Skin is warm and dry. No rash noted.  Psychiatric: She has a normal mood and affect.    ED Course  Procedures (including critical care time) Labs Review Labs Reviewed - No data to display Imaging Review No results found.    MDM   1. URI (upper respiratory infection)   2. PND (post-nasal drip)      Seltzer cold and flu nighttime medication Drink plenty of fluids Read the URI infection instructions.    Hayden Rasmussenavid Zenith Lamphier, NP 12/16/13 1118  Hayden Rasmussenavid Yancey Pedley, NP 12/16/13 1121

## 2013-12-16 NOTE — Discharge Instructions (Signed)
Cough, Adult  A cough is a reflex that helps clear your throat and airways. It can help heal the body or may be a reaction to an irritated airway. A cough may only last 2 or 3 weeks (acute) or may last more than 8 weeks (chronic).  CAUSES Acute cough:  Viral or bacterial infections. Chronic cough:  Infections.  Allergies.  Asthma.  Post-nasal drip.  Smoking.  Heartburn or acid reflux.  Some medicines.  Chronic lung problems (COPD).  Cancer. SYMPTOMS   Cough.  Fever.  Chest pain.  Increased breathing rate.  High-pitched whistling sound when breathing (wheezing).  Colored mucus that you cough up (sputum). TREATMENT   A bacterial cough may be treated with antibiotic medicine.  A viral cough must run its course and will not respond to antibiotics.  Your caregiver may recommend other treatments if you have a chronic cough. HOME CARE INSTRUCTIONS   Only take over-the-counter or prescription medicines for pain, discomfort, or fever as directed by your caregiver. Use cough suppressants only as directed by your caregiver.  Use a cold steam vaporizer or humidifier in your bedroom or home to help loosen secretions.  Sleep in a semi-upright position if your cough is worse at night.  Rest as needed.  Stop smoking if you smoke. SEEK IMMEDIATE MEDICAL CARE IF:   You have pus in your sputum.  Your cough starts to worsen.  You cannot control your cough with suppressants and are losing sleep.  You begin coughing up blood.  You have difficulty breathing.  You develop pain which is getting worse or is uncontrolled with medicine.  You have a fever. MAKE SURE YOU:   Understand these instructions.  Will watch your condition.  Will get help right away if you are not doing well or get worse. Document Released: 05/29/2011 Document Revised: 02/22/2012 Document Reviewed: 05/29/2011 Exit[employer] Patient Information 2014 [employer]  Upper Respiratory Infection,  Adult An upper respiratory infection (URI) is also sometimes known as the common cold. The upper respiratory tract includes the nose, sinuses, throat, trachea, and bronchi. Bronchi are the airways leading to the lungs. Most people improve within 1 week, but symptoms can last up to 2 weeks. A residual cough may last even longer.  CAUSES Many different viruses can infect the tissues lining the upper respiratory tract. The tissues become irritated and inflamed and often become very moist. Mucus production is also common. A cold is contagious. You can easily spread the virus to others by oral contact. This includes kissing, sharing a glass, coughing, or sneezing. Touching your mouth or nose and then touching a surface, which is then touched by another person, can also spread the virus. SYMPTOMS  Symptoms typically develop 1 to 3 days after you come in contact with a cold virus. Symptoms vary from person to person. They may include:  Runny nose.  Sneezing.  Nasal congestion.  Sinus irritation.  Sore throat.  Loss of voice (laryngitis).  Cough.  Fatigue.  Muscle aches.  Loss of appetite.  Headache.  Low-grade fever. DIAGNOSIS  You might diagnose your own cold based on familiar symptoms, since most people get a cold 2 to 3 times a year. Your caregiver can confirm this based on your exam. Most importantly, your caregiver can check that your symptoms are not due to another disease such as strep throat, sinusitis, pneumonia, asthma, or epiglottitis. Blood tests, throat tests, and X-rays are not necessary to diagnose a common cold, but they may sometimes be helpful   in excluding other more serious diseases. Your caregiver will decide if any further tests are required. RISKS AND COMPLICATIONS  You may be at risk for a more severe case of the common cold if you smoke cigarettes, have chronic heart disease (such as heart failure) or lung disease (such as asthma), or if you have a weakened immune  system. The very young and very old are also at risk for more serious infections. Bacterial sinusitis, middle ear infections, and bacterial pneumonia can complicate the common cold. The common cold can worsen asthma and chronic obstructive pulmonary disease (COPD). Sometimes, these complications can require emergency medical care and may be life-threatening. PREVENTION  The best way to protect against getting a cold is to practice good hygiene. Avoid oral or hand contact with people with cold symptoms. Wash your hands often if contact occurs. There is no clear evidence that vitamin C, vitamin E, echinacea, or exercise reduces the chance of developing a cold. However, it is always recommended to get plenty of rest and practice good nutrition. TREATMENT  Treatment is directed at relieving symptoms. There is no cure. Antibiotics are not effective, because the infection is caused by a virus, not by bacteria. Treatment may include:  Increased fluid intake. Sports drinks offer valuable electrolytes, sugars, and fluids.  Breathing heated mist or steam (vaporizer or shower).  Eating chicken soup or other clear broths, and maintaining good nutrition.  Getting plenty of rest.  Using gargles or lozenges for comfort.  Controlling fevers with ibuprofen or acetaminophen as directed by your caregiver.  Increasing usage of your inhaler if you have asthma. Zinc gel and zinc lozenges, taken in the first 24 hours of the common cold, can shorten the duration and lessen the severity of symptoms. Pain medicines may help with fever, muscle aches, and throat pain. A variety of non-prescription medicines are available to treat congestion and runny nose. Your caregiver can make recommendations and may suggest nasal or lung inhalers for other symptoms.  HOME CARE INSTRUCTIONS   Only take over-the-counter or prescription medicines for pain, discomfort, or fever as directed by your caregiver.  Use a warm mist humidifier  or inhale steam from a shower to increase air moisture. This may keep secretions moist and make it easier to breathe.  Drink enough water and fluids to keep your urine clear or pale yellow.  Rest as needed.  Return to work when your temperature has returned to normal or as your caregiver advises. You may need to stay home longer to avoid infecting others. You can also use a face mask and careful hand washing to prevent spread of the virus. SEEK MEDICAL CARE IF:   After the first few days, you feel you are getting worse rather than better.  You need your caregiver's advice about medicines to control symptoms.  You develop chills, worsening shortness of breath, or brown or red sputum. These may be signs of pneumonia.  You develop yellow or brown nasal discharge or pain in the face, especially when you bend forward. These may be signs of sinusitis.  You develop a fever, swollen neck glands, pain with swallowing, or white areas in the back of your throat. These may be signs of strep throat. SEEK IMMEDIATE MEDICAL CARE IF:   You have a fever.  You develop severe or persistent headache, ear pain, sinus pain, or chest pain.  You develop wheezing, a prolonged cough, cough up blood, or have a change in your usual mucus (if you have chronic   lung disease).  You develop sore muscles or a stiff neck. Document Released: 05/26/2001 Document Revised: 02/22/2012 Document Reviewed: 04/03/2011 Exit[employer] Patient Information 2014 [employer].  

## 2013-12-16 NOTE — ED Notes (Signed)
3 weeks of cough, sore throat and right ear pain.  Now she is has a thick green productive cough.  Denies fever at home.

## 2013-12-19 NOTE — ED Provider Notes (Signed)
Medical screening examination/treatment/procedure(s) were performed by resident physician or non-physician practitioner and as supervising physician I was immediately available for consultation/collaboration.   [name] [name].   [name] D [name], MD 12/19/13 0847 

## 2014-01-28 ENCOUNTER — Emergency Department (HOSPITAL_COMMUNITY): Payer: BC Managed Care – PPO

## 2014-01-28 ENCOUNTER — Encounter (HOSPITAL_COMMUNITY): Payer: Self-pay | Admitting: Emergency Medicine

## 2014-01-28 ENCOUNTER — Inpatient Hospital Stay (HOSPITAL_COMMUNITY)
Admission: EM | Admit: 2014-01-28 | Discharge: 2014-01-30 | DRG: 690 | Disposition: A | Discharge status: Home / Self Care | Payer: BC Managed Care – PPO | Attending: Internal Medicine | Admitting: Internal Medicine

## 2014-01-28 DIAGNOSIS — IMO0001 Reserved for inherently not codable concepts without codable children: Secondary | ICD-10-CM

## 2014-01-28 DIAGNOSIS — D72829 Elevated white blood cell count, unspecified: Secondary | ICD-10-CM | POA: Diagnosis present

## 2014-01-28 DIAGNOSIS — N179 Acute kidney failure, unspecified: Secondary | ICD-10-CM | POA: Diagnosis present

## 2014-01-28 DIAGNOSIS — Z7982 Long term (current) use of aspirin: Secondary | ICD-10-CM

## 2014-01-28 DIAGNOSIS — J45909 Unspecified asthma, uncomplicated: Secondary | ICD-10-CM | POA: Diagnosis present

## 2014-01-28 DIAGNOSIS — N390 Urinary tract infection, site not specified: Principal | ICD-10-CM

## 2014-01-28 DIAGNOSIS — I959 Hypotension, unspecified: Secondary | ICD-10-CM

## 2014-01-28 DIAGNOSIS — Z23 Encounter for immunization: Secondary | ICD-10-CM

## 2014-01-28 DIAGNOSIS — N183 Chronic kidney disease, stage 3 unspecified: Secondary | ICD-10-CM | POA: Diagnosis present

## 2014-01-28 DIAGNOSIS — E872 Acidosis, unspecified: Secondary | ICD-10-CM | POA: Diagnosis present

## 2014-01-28 DIAGNOSIS — E860 Dehydration: Secondary | ICD-10-CM | POA: Diagnosis present

## 2014-01-28 DIAGNOSIS — E7800 Pure hypercholesterolemia, unspecified: Secondary | ICD-10-CM | POA: Diagnosis present

## 2014-01-28 DIAGNOSIS — I10 Essential (primary) hypertension: Secondary | ICD-10-CM | POA: Diagnosis present

## 2014-01-28 DIAGNOSIS — IMO0002 Reserved for concepts with insufficient information to code with codable children: Secondary | ICD-10-CM | POA: Diagnosis present

## 2014-01-28 DIAGNOSIS — Z833 Family history of diabetes mellitus: Secondary | ICD-10-CM

## 2014-01-28 DIAGNOSIS — Z888 Allergy status to other drugs, medicaments and biological substances status: Secondary | ICD-10-CM

## 2014-01-28 DIAGNOSIS — R109 Unspecified abdominal pain: Secondary | ICD-10-CM

## 2014-01-28 DIAGNOSIS — R1115 Cyclical vomiting syndrome unrelated to migraine: Secondary | ICD-10-CM | POA: Diagnosis present

## 2014-01-28 DIAGNOSIS — I129 Hypertensive chronic kidney disease with stage 1 through stage 4 chronic kidney disease, or unspecified chronic kidney disease: Secondary | ICD-10-CM | POA: Diagnosis present

## 2014-01-28 DIAGNOSIS — E785 Hyperlipidemia, unspecified: Secondary | ICD-10-CM

## 2014-01-28 DIAGNOSIS — E118 Type 2 diabetes mellitus with unspecified complications: Secondary | ICD-10-CM

## 2014-01-28 DIAGNOSIS — Z91018 Allergy to other foods: Secondary | ICD-10-CM

## 2014-01-28 DIAGNOSIS — N289 Disorder of kidney and ureter, unspecified: Secondary | ICD-10-CM

## 2014-01-28 DIAGNOSIS — E1169 Type 2 diabetes mellitus with other specified complication: Secondary | ICD-10-CM | POA: Diagnosis present

## 2014-01-28 DIAGNOSIS — E1165 Type 2 diabetes mellitus with hyperglycemia: Secondary | ICD-10-CM | POA: Diagnosis present

## 2014-01-28 DIAGNOSIS — D649 Anemia, unspecified: Secondary | ICD-10-CM

## 2014-01-28 DIAGNOSIS — J302 Other seasonal allergic rhinitis: Secondary | ICD-10-CM

## 2014-01-28 DIAGNOSIS — T886XXA Anaphylactic reaction due to adverse effect of correct drug or medicament properly administered, initial encounter: Secondary | ICD-10-CM

## 2014-01-28 HISTORY — DX: Hyperlipidemia, unspecified: E78.5

## 2014-01-28 LAB — CBC WITH DIFFERENTIAL/PLATELET
BASOS PCT: 0 % (ref 0–1)
Basophils Absolute: 0 10*3/uL (ref 0.0–0.1)
EOS ABS: 0.1 10*3/uL (ref 0.0–0.7)
Eosinophils Relative: 0 % (ref 0–5)
HCT: 38.9 % (ref 36.0–46.0)
Hemoglobin: 13.7 g/dL (ref 12.0–15.0)
Lymphocytes Relative: 11 % — ABNORMAL LOW (ref 12–46)
Lymphs Abs: 2.1 10*3/uL (ref 0.7–4.0)
MCH: 32.9 pg (ref 26.0–34.0)
MCHC: 35.2 g/dL (ref 30.0–36.0)
MCV: 93.5 fL (ref 78.0–100.0)
MONOS PCT: 8 % (ref 3–12)
Monocytes Absolute: 1.4 10*3/uL — ABNORMAL HIGH (ref 0.1–1.0)
NEUTROS PCT: 81 % — AB (ref 43–77)
Neutro Abs: 14.7 10*3/uL — ABNORMAL HIGH (ref 1.7–7.7)
PLATELETS: 286 10*3/uL (ref 150–400)
RBC: 4.16 MIL/uL (ref 3.87–5.11)
RDW: 12.6 % (ref 11.5–15.5)
WBC: 18.2 10*3/uL — ABNORMAL HIGH (ref 4.0–10.5)

## 2014-01-28 LAB — COMPREHENSIVE METABOLIC PANEL
ALT: 27 U/L (ref 0–35)
AST: 23 U/L (ref 0–37)
Albumin: 4.3 g/dL (ref 3.5–5.2)
Alkaline Phosphatase: 90 U/L (ref 39–117)
BUN: 24 mg/dL — ABNORMAL HIGH (ref 6–23)
CALCIUM: 9.5 mg/dL (ref 8.4–10.5)
CO2: 20 meq/L (ref 19–32)
Chloride: 99 mEq/L (ref 96–112)
Creatinine, Ser: 1.35 mg/dL — ABNORMAL HIGH (ref 0.50–1.10)
GFR, EST AFRICAN AMERICAN: 47 mL/min — AB (ref >90)
GFR, EST NON AFRICAN AMERICAN: 41 mL/min — AB (ref >90)
GLUCOSE: 270 mg/dL — AB (ref 70–99)
Potassium: 4 mEq/L (ref 3.7–5.3)
Sodium: 141 mEq/L (ref 137–147)
Total Bilirubin: 0.5 mg/dL (ref 0.3–1.2)
Total Protein: 8 g/dL (ref 6.0–8.3)

## 2014-01-28 LAB — URINALYSIS, ROUTINE W REFLEX MICROSCOPIC
Bilirubin Urine: NEGATIVE
Glucose, UA: NEGATIVE mg/dL
Hgb urine dipstick: NEGATIVE
Ketones, ur: >80 mg/dL — AB
NITRITE: NEGATIVE
Protein, ur: NEGATIVE mg/dL
Specific Gravity, Urine: 1.022 (ref 1.005–1.030)
UROBILINOGEN UA: 0.2 mg/dL (ref 0.0–1.0)
pH: 8.5 — ABNORMAL HIGH (ref 5.0–8.0)

## 2014-01-28 LAB — LIPASE, BLOOD: LIPASE: 45 U/L (ref 11–59)

## 2014-01-28 LAB — URINE MICROSCOPIC-ADD ON

## 2014-01-28 LAB — GLUCOSE, CAPILLARY
GLUCOSE-CAPILLARY: 115 mg/dL — AB (ref 70–99)
Glucose-Capillary: 130 mg/dL — ABNORMAL HIGH (ref 70–99)

## 2014-01-28 LAB — HEMOGLOBIN A1C
Hgb A1c MFr Bld: 8.4 % — ABNORMAL HIGH (ref <5.7)
Mean Plasma Glucose: 194 mg/dL — ABNORMAL HIGH (ref <117)

## 2014-01-28 LAB — MAGNESIUM: Magnesium: 1.9 mg/dL (ref 1.5–2.5)

## 2014-01-28 LAB — CG4 I-STAT (LACTIC ACID)
LACTIC ACID, VENOUS: 2.92 mmol/L — AB (ref 0.5–2.2)
Lactic Acid, Venous: 3.72 mmol/L — ABNORMAL HIGH (ref 0.5–2.2)

## 2014-01-28 MED ORDER — SODIUM CHLORIDE 0.9 % IV BOLUS (SEPSIS)
1000.0000 mL | Freq: Once | INTRAVENOUS | Status: AC
  Administered 2014-01-28: 1000 mL via INTRAVENOUS

## 2014-01-28 MED ORDER — SODIUM CHLORIDE 0.9 % IV SOLN
INTRAVENOUS | Status: AC
  Administered 2014-01-28: 12:00:00 via INTRAVENOUS

## 2014-01-28 MED ORDER — ACETAMINOPHEN 650 MG RE SUPP: 650.0000 mg | Freq: Four times a day (QID) | RECTAL | Status: DC | PRN

## 2014-01-28 MED ORDER — ONDANSETRON HCL 4 MG/2ML IJ SOLN
4.0000 mg | Freq: Four times a day (QID) | INTRAMUSCULAR | Status: DC | PRN
Start: 2014-01-28 — End: 2014-01-30

## 2014-01-28 MED ORDER — INSULIN DETEMIR 100 UNIT/ML ~~LOC~~ SOLN
5.0000 [IU] | Freq: Every day | SUBCUTANEOUS | Status: DC
  Administered 2014-01-28 – 2014-01-29 (×2): 5 [IU] via SUBCUTANEOUS
  Filled 2014-01-28 (×3): qty 0.05

## 2014-01-28 MED ORDER — ASPIRIN 81 MG PO CHEW
81.0000 mg | CHEWABLE_TABLET | Freq: Every day | ORAL | Status: DC
  Administered 2014-01-29 – 2014-01-30 (×2): 81 mg via ORAL
  Filled 2014-01-28 (×2): qty 1

## 2014-01-28 MED ORDER — LORATADINE 10 MG PO TABS
10.0000 mg | ORAL_TABLET | Freq: Every day | ORAL | Status: DC
  Administered 2014-01-29 – 2014-01-30 (×2): 10 mg via ORAL
  Filled 2014-01-28 (×2): qty 1

## 2014-01-28 MED ORDER — ONDANSETRON HCL 4 MG/2ML IJ SOLN
4.0000 mg | Freq: Three times a day (TID) | INTRAMUSCULAR | Status: DC | PRN
Start: 2014-01-28 — End: 2014-01-28

## 2014-01-28 MED ORDER — ACETAMINOPHEN 325 MG PO TABS: 650.0000 mg | ORAL_TABLET | Freq: Four times a day (QID) | ORAL | Status: DC | PRN

## 2014-01-28 MED ORDER — MOMETASONE FURO-FORMOTEROL FUM 100-5 MCG/ACT IN AERO
2.0000 | INHALATION_SPRAY | Freq: Two times a day (BID) | RESPIRATORY_TRACT | Status: DC
  Administered 2014-01-28 – 2014-01-30 (×3): 2 via RESPIRATORY_TRACT
  Filled 2014-01-28 (×3): qty 8.8

## 2014-01-28 MED ORDER — ENOXAPARIN SODIUM 40 MG/0.4ML ~~LOC~~ SOLN
40.0000 mg | SUBCUTANEOUS | Status: DC
  Administered 2014-01-28 – 2014-01-29 (×2): 40 mg via SUBCUTANEOUS
  Filled 2014-01-28 (×4): qty 0.4

## 2014-01-28 MED ORDER — MORPHINE SULFATE 2 MG/ML IJ SOLN: 1.0000 mg | INTRAMUSCULAR | Status: DC | PRN

## 2014-01-28 MED ORDER — ALLOPURINOL 100 MG PO TABS
100.0000 mg | ORAL_TABLET | Freq: Every day | ORAL | Status: DC
  Administered 2014-01-29 – 2014-01-30 (×2): 100 mg via ORAL
  Filled 2014-01-28 (×2): qty 1

## 2014-01-28 MED ORDER — DEXTROSE 5 % IV SOLN
1.0000 g | Freq: Once | INTRAVENOUS | Status: AC
  Administered 2014-01-28: 1 g via INTRAVENOUS
  Filled 2014-01-28: qty 10

## 2014-01-28 MED ORDER — AMLODIPINE BESYLATE 5 MG PO TABS
5.0000 mg | ORAL_TABLET | Freq: Every day | ORAL | Status: DC
  Administered 2014-01-29 – 2014-01-30 (×2): 5 mg via ORAL
  Filled 2014-01-28 (×2): qty 1

## 2014-01-28 MED ORDER — ALBUTEROL SULFATE (2.5 MG/3ML) 0.083% IN NEBU: 3.0000 mL | INHALATION_SOLUTION | Freq: Four times a day (QID) | RESPIRATORY_TRACT | Status: DC | PRN

## 2014-01-28 MED ORDER — IOHEXOL 300 MG/ML  SOLN
100.0000 mL | Freq: Once | INTRAMUSCULAR | Status: AC | PRN
  Administered 2014-01-28: 100 mL via INTRAVENOUS

## 2014-01-28 MED ORDER — ADULT MULTIVITAMIN W/MINERALS CH
1.0000 | ORAL_TABLET | Freq: Every day | ORAL | Status: DC
  Administered 2014-01-29 – 2014-01-30 (×2): 1 via ORAL
  Filled 2014-01-28 (×2): qty 1

## 2014-01-28 MED ORDER — DEXTROSE 5 % IV SOLN
1.0000 g | INTRAVENOUS | Status: DC
  Administered 2014-01-29: 1 g via INTRAVENOUS
  Filled 2014-01-28 (×2): qty 10

## 2014-01-28 MED ORDER — OXYCODONE HCL 5 MG PO TABS: 5.0000 mg | ORAL_TABLET | ORAL | Status: DC | PRN

## 2014-01-28 MED ORDER — MORPHINE SULFATE 4 MG/ML IJ SOLN
4.0000 mg | INTRAMUSCULAR | Status: DC | PRN
  Administered 2014-01-28 (×2): 4 mg via INTRAVENOUS
  Filled 2014-01-28 (×2): qty 1

## 2014-01-28 MED ORDER — SIMVASTATIN 10 MG PO TABS
10.0000 mg | ORAL_TABLET | Freq: Every day | ORAL | Status: DC
  Administered 2014-01-28 – 2014-01-29 (×2): 10 mg via ORAL
  Filled 2014-01-28 (×4): qty 1

## 2014-01-28 MED ORDER — INSULIN ASPART 100 UNIT/ML ~~LOC~~ SOLN
0.0000 [IU] | Freq: Three times a day (TID) | SUBCUTANEOUS | Status: DC
  Administered 2014-01-29 (×3): 2 [IU] via SUBCUTANEOUS
  Administered 2014-01-30: 3 [IU] via SUBCUTANEOUS

## 2014-01-28 MED ORDER — PANTOPRAZOLE SODIUM 40 MG PO TBEC
40.0000 mg | DELAYED_RELEASE_TABLET | Freq: Every day | ORAL | Status: DC
  Administered 2014-01-29: 40 mg via ORAL
  Filled 2014-01-28: qty 1

## 2014-01-28 MED ORDER — CEFTRIAXONE SODIUM 1 G IJ SOLR
1.0000 g | INTRAMUSCULAR | Status: DC
  Filled 2014-01-28: qty 10

## 2014-01-28 MED ORDER — ONDANSETRON HCL 4 MG/2ML IJ SOLN
4.0000 mg | Freq: Once | INTRAMUSCULAR | Status: AC
  Administered 2014-01-28: 4 mg via INTRAVENOUS
  Filled 2014-01-28: qty 2

## 2014-01-28 MED ORDER — ONDANSETRON HCL 4 MG/2ML IJ SOLN
4.0000 mg | Freq: Four times a day (QID) | INTRAMUSCULAR | Status: DC | PRN
  Administered 2014-01-28: 4 mg via INTRAVENOUS
  Filled 2014-01-28: qty 2

## 2014-01-28 MED ORDER — DEXTROSE 5 % IV BOLUS
500.0000 mL | Freq: Once | INTRAVENOUS | Status: AC
  Administered 2014-01-28: 500 mL via INTRAVENOUS

## 2014-01-28 NOTE — ED Notes (Signed)
Report to Brittney, rn 

## 2014-01-28 NOTE — ED Notes (Signed)
Per md order pt given gingerale to drink on.  Also pt sts that pain has returned some so prn pain med given.

## 2014-01-28 NOTE — H&P (Signed)
[facility] History and Physical  Cheryl Gill WGN:[id number] DOB: 1951/12/02 DOA: 01/28/2014  Referring physician: Dr. Oletta Lamas PCP: Dorrene German, MD   Chief Complaint: Nausea, vomiting, abdominal pain  HPI: Cheryl Gill is a 63 y.o. female past medical history significant for diabetes, hypertension, hypercholesterolemia, chronic asthma and seasonal allergies; came to the hospital complaining of suprapubic abdominal pain, nausea/vomiting and difficulty keeping things down for the last 2-3 days. Patient reports strong urine odor as well. She denies CP, palpitations, hematemesis, melena, hematochezia or any other acute complaints. in in the ED urinalysis have suggested UTI, patient with WBCs of 18,000 and lactic acid of 2.9. Urine culture taken, patient is started on Rocephin, IV fluid resuscitation initiated on [facility] contacted 20 patient for further evaluation and treatment.    Review of Systems:  Negative except as otherwise mentioned on history of present illness  Past Medical History  Diagnosis Date  . Diabetes mellitus   . Hypertension   . Hypercholesterolemia   . Seasonal allergies    Past Surgical History  Procedure Laterality Date  . Abdominal hysterectomy     Social History:  reports that she has never smoked. She has never used smokeless tobacco. She reports that she does not drink alcohol or use illicit drugs.  Allergies  Allergen Reactions  . Ace Inhibitors Anaphylaxis  . Lisinopril Anaphylaxis  . Mushroom Extract Complex Swelling  . Tetanus Toxoids Nausea And Vomiting and Swelling    Family history: Diabetes and hypertension  Prior to Admission medications   Medication Sig Start Date End Date Taking? Authorizing Provider  allopurinol (ZYLOPRIM) 100 MG tablet Take 100 mg by mouth daily.   Yes Historical Provider, MD  aspirin 81 MG tablet Take 81 mg by mouth daily.   Yes Historical Provider, MD  azithromycin (ZITHROMAX) 250 MG tablet  Take 250 mg by mouth daily. 01/25/14 01/30/14 Yes Historical Provider, MD  Fluticasone-Salmeterol (ADVAIR) 250-50 MCG/DOSE AEPB Inhale 1 puff into the lungs daily as needed (for shortness of breath).   Yes Historical Provider, MD  glipiZIDE (GLUCOTROL) 10 MG tablet Take 10 mg by mouth 2 (two) times daily before a meal.   Yes Historical Provider, MD  hydrochlorothiazide (HYDRODIURIL) 25 MG tablet Take 1 tablet (25 mg total) by mouth daily. 01/27/13  Yes Cathlyn Parsons, NP  loratadine (CLARITIN) 10 MG tablet Take 10 mg by mouth daily.   Yes Historical Provider, MD  metFORMIN (GLUCOPHAGE) 500 MG tablet Take 500 mg by mouth 2 (two) times daily with a meal.   Yes Historical Provider, MD  Multiple Vitamin (MULTIVITAMIN WITH MINERALS) TABS Take 1 tablet by mouth daily.   Yes Historical Provider, MD  pseudoephedrine-guaifenesin (MUCINEX D) 60-600 MG per tablet Take 1 tablet by mouth daily as needed for congestion.   Yes Historical Provider, MD  simvastatin (ZOCOR) 10 MG tablet Take 10 mg by mouth at bedtime.   Yes Historical Provider, MD  albuterol (PROVENTIL HFA;VENTOLIN HFA) 108 (90 BASE) MCG/ACT inhaler Inhale 2 puffs into the lungs every 6 (six) hours as needed. For shortness of breath or wheezing    Historical Provider, MD   Physical Exam: Filed Vitals:   01/28/14 1210  BP: 123/73  Pulse: 89  Temp: 98.3 F (36.8 C)  Resp: 19    BP 123/73  Pulse 89  Temp(Src) 98.3 F (36.8 C) (Oral)  Resp 19  Ht 5' 1.81" (1.57 m)  Wt 90.7 kg (199 lb 15.3 oz)  BMI 36.80 kg/m2  SpO2 97%  General:  Appears calm and in no acute distress; afebrile Eyes: PERRL, normal lids, irises & conjunctiva ENT: grossly normal hearing, no erythema or exudates; dry MM, no drainage out of ears or nostrils Neck: no LAD, masses or thyromegaly; no JVD Cardiovascular: RRR, no m/r/g. No LE edema. Respiratory: CTA bilaterally, no w/r/r. Normal respiratory effort. Abdomen: soft, nd; positive suprapubic pain; positive BS Skin: no  rash, petechiae or induration seen on exam Musculoskeletal: grossly normal tone BUE/BLE Psychiatric: grossly normal mood and affect, speech fluent and appropriate Neurologic: grossly non-focal.          Labs on Admission:  Basic Metabolic Panel:  Recent Labs Lab 01/28/14 0337  NA 141  K 4.0  CL 99  CO2 20  GLUCOSE 270*  BUN 24*  CREATININE 1.35*  CALCIUM 9.5   Liver Function Tests:  Recent Labs Lab 01/28/14 0337  AST 23  ALT 27  ALKPHOS 90  BILITOT 0.5  PROT 8.0  ALBUMIN 4.3    Recent Labs Lab 01/28/14 0337  LIPASE 45   CBC:  Recent Labs Lab 01/28/14 0337  WBC 18.2*  NEUTROABS 14.7*  HGB 13.7  HCT 38.9  MCV 93.5  PLT 286   Radiological Exams on Admission: Ct Abdomen Pelvis W Contrast  01/28/2014   CLINICAL DATA:  63 year old female epigastric pain nausea vomiting. Initial encounter.  EXAM: CT ABDOMEN AND PELVIS WITH CONTRAST  TECHNIQUE: Multidetector CT imaging of the abdomen and pelvis was performed using the standard protocol following bolus administration of intravenous contrast.  CONTRAST:  OMNIPAQUE IOHEXOL 300 MG/ML  SOLN  COMPARISON:  CT Abdomen and Pelvis 01/16/2011.  FINDINGS: No pericardial or pleural effusion.  Minor lung base atelectasis.  No acute osseous abnormality identified. Degenerative changes in the lumbar spine.  No pelvic free fluid. Negative bladder. Decompressed rectum. Uterus and adnexa appear surgically absent.  Negative sigmoid colon except for occasional diverticula. Negative left colon. Small calcification probably of an epiploic appendage, unchanged. Negative transverse colon. Negative right colon. Appendix not identified, favor surgically absent. No dilated small bowel. Negative terminal ileum. Small hiatal hernia. Largely decompressed stomach. Negative duodenum.  Further decreased density throughout the liver. Negative gallbladder, spleen, pancreas, adrenal glands, portal venous system, and major arterial structures in the  abdomen and pelvis. Kidneys appear stable and within normal limits. No abdominal free fluid. No lymphadenopathy. Large body habitus. No focal inflammatory stranding.  IMPRESSION: No acute or inflammatory process identified in the abdomen or pelvis. Progressed hepatic steatosis.   Electronically Signed   By: Augusto Gamble M.D.   On: 01/28/2014 07:05   Dg Abd Acute W/chest  01/28/2014   CLINICAL DATA:  63 year old female abdominal pain nausea vomiting. Initial encounter.  EXAM: ACUTE ABDOMEN SERIES (ABDOMEN 2 VIEW & CHEST 1 VIEW)  COMPARISON:  CT Abdomen and Pelvis to 02/2011 and earlier.  FINDINGS: Low lung volumes. Normal cardiac size and mediastinal contours. No pneumothorax or pneumoperitoneum. No confluent pulmonary opacity. Suggestion of a small hiatal hernia.  Non obstructed bowel gas pattern. Small calcified epiploic appendage is unchanged and inconsequential. Negative abdominal and pelvic visceral contours. Pelvic phleboliths. No acute osseous abnormality identified.  IMPRESSION: Non obstructed bowel gas pattern, no free air.  Low lung volumes.   Electronically Signed   By: Augusto Gamble M.D.   On: 01/28/2014 04:56    EKG:  Ordered and pending  Assessment/Plan 1-UTI (lower urinary tract infection):  -Will admit to MedSurg -Start empirical IV Rocephin and follow urine cultures -Provide IV fluid resuscitation -As  needed antiemetics and advanced diet slowly -Supportive care and as needed antipyretics  2-HTN (hypertension): Stable. Will stop hydrochlorothiazide given ongoing dehydration and mild elevation of her creatinine.  -Will start low-dose amlodipine instead to continue providing control of her blood pressure.  3-Diabetes mellitus type 2, uncontrolled: last hemoglobin A1c (01/16/13) 9.3. -Will stop oral hypoglycemic agents, especially metformin, given ongoing lactic acidosis. -Start sliding scale insulin and low dose Levemir; patient on CLD -check A1 andC  4-Persistent vomiting and Abdominal  pain: Secondary to problem #1. Other considerations could be viral gastroenteritis. -Will provide treatment as mentioned above for her UTI -PPIs -Fluid resuscitation -PRN antiemetics and analgesics.  5-Lactic acidosis: Due to dehydration with ongoing persistent nausea and vomiting. Also due to continuation of metformin in settings of volume depletion. -Provide IV fluid resuscitation -Will repeat lactic acid in the morning  6-acute on chronic renal failure: Patient's chronic kidney disease stage III at baseline. Continue use of metformin and also HCT C. in the settings of dehydration, plus ongoing UTI to be responsible for exacerbation in her kidney function. -stop nephrotoxic agents -provide IVF's -treat UTI -follow BMET in am  7-Asthma chronic: continue when necessary albuterol and also schedule dulera. -patient denies SOB and currently no wheezing.     8-HLD (hyperlipidemia): Will check lipid profile and continue statins.  9-Leukocytosis, unspecified: Secondary to infection and demargination from dehydration.  -Provide IV fluids -Treatment for UTI as mentioned above -A CBC in the morning to follow wbc's trend.    DVT: Lovenox   Code Status: Full Family Communication: husband at bedside Disposition Plan: LOS > 2 midnights, med-surg, inpatient  Time spent: 50 minutes  Tyce Delcid [facility] Pager [phone]

## 2014-01-28 NOTE — ED Notes (Signed)
Dr. Oletta LamasGhim at bedside to speak with pt. Pt sts now that she would like to be admitted since she is not feeling better.

## 2014-01-28 NOTE — ED Provider Notes (Signed)
Received pt in sign out.  Pt with abd pain, N/V, leukocytosis.  Pt with h/o type 2 DM, HTN, hypercholesterolemia.  CT scan shows no acute process.  Pt felt improved initially, but has failed oral trial twice, continues to have mild abd pain.  Pt is agreeable for admission due to continued symptoms that cannot be controlled.  Will discuss with [facility].    Gavin PoundMichael Y. Trisha Morandi, MD 01/28/14 1029

## 2014-01-28 NOTE — ED Notes (Signed)
Pt sts still feeling nauseated and did vomit x1. Dr at bedside to discuss with pt and sts we will give a hour and see how she feels then.

## 2014-01-28 NOTE — ED Notes (Signed)
Provider in room  

## 2014-01-28 NOTE — ED Notes (Signed)
Lactic acid results shown to Dr. Oletta LamasGhim

## 2014-01-28 NOTE — ED Notes (Signed)
1st contact with pt. Pt resting at this time with husband at bedside. Nothing needed.

## 2014-01-28 NOTE — ED Notes (Signed)
Pt taken to xray 

## 2014-01-28 NOTE — ED Notes (Signed)
Patient with abdominal pain, nausea and vomiting since 2300 last night.  Patient denies any diarrhea.  Patient is CAOx3.

## 2014-01-28 NOTE — Discharge Instructions (Signed)
Metformin and X-ray Contrast Studies °For some X-ray exams, a contrast dye is used. Contrast dye is a type of medicine used to make the X-ray image clearer. The contrast dye is given to the patient through a vein (intravenously). If you need to have this type of X-ray exam and you take a medication called metformin, your caregiver may have you stop taking metformin before the exam.  °LACTIC ACIDOSIS °In rare cases, a serious medical condition called lactic acidosis can develop in people who take metformin and receive contrast dye. The following conditions can increase the risk of this complication:  °· Kidney failure. °· Liver problems. °· Certain types of heart problems such as: °· Heart failure. °· Heart attack. °· Heart infection. °· Heart valve problems. °· Alcohol abuse. °If left untreated, lactic acidosis can lead to coma.  °SYMPTOMS OF LACTIC ACIDOSIS °Symptoms of lactic acidosis can include: °· Rapid breathing (hyperventilation). °· Neurologic symptoms such as: °· Headaches. °· Confusion. °· Dizziness. °· Excessive sweating. °· Feeling sick to your stomach (nauseous) or throwing up (vomiting). °AFTER THE X-RAY EXAM °· Stay well-hydrated. Drink fluids as instructed by your caregiver. °· If you have a risk of developing lactic acidosis, blood tests may be done to make sure your kidney function is okay. °· Metformin is usually stopped for 48 hours after the X-ray exam. Ask your caregiver when you can start taking metformin again. °SEEK MEDICAL CARE IF:  °· You have shortness of breath or difficulty breathing. °· You develop a headache that does not go away. °· You have nausea or vomiting. °· You urinate more than normal. °· You develop a skin rash and have: °· Redness. °· Swelling. °· Itching. °Document Released: 11/18/2009 Document Revised: 02/22/2012 Document Reviewed: 11/18/2009 °[employer]® Patient Information ©2014 [employer]. ° °

## 2014-01-28 NOTE — ED Provider Notes (Signed)
CSN: [id number]     Arrival date & time 01/28/14  [id number] History   First MD Initiated Contact with Patient 01/28/14 [phone]     Chief Complaint  Patient presents with  . Nausea  . Emesis  . Abdominal Pain     (Consider location/radiation/quality/duration/timing/severity/associated sxs/prior Treatment) HPI Comments: 63 y/o Pt with hx of DM, HTN, HL, sp hysterectomy comes in with cc of abd pain. Pt's abd pain, along with nausea and emesis started at 11:00 pm. Pain is epigastric, periumbilical - and got suddenly worse. She has had several episodes of emesis. No hx of abd surgery. Patient reports strong urine odor as well. She denies CP, palpitations, hematemesis, melena, hematochezia or any other acute complaints.   Patient is a 63 y.o. female presenting with vomiting and abdominal pain. The history is provided by the patient.  Emesis Associated symptoms: abdominal pain   Associated symptoms: no headaches   Abdominal Pain Associated symptoms: nausea and vomiting   Associated symptoms: no chest pain, no dysuria and no shortness of breath     Past Medical History  Diagnosis Date  . Diabetes mellitus   . Hypertension   . Hypercholesterolemia   . Seasonal allergies    Past Surgical History  Procedure Laterality Date  . Abdominal hysterectomy     No family history on file. History  Substance Use Topics  . Smoking status: Never Smoker   . Smokeless tobacco: Never Used  . Alcohol Use: No   OB History   Grav Para Term Preterm Abortions TAB SAB Ect Mult Living                 Review of Systems  Constitutional: Positive for activity change. Negative for appetite change.  Respiratory: Negative for shortness of breath.   Cardiovascular: Negative for chest pain.  Gastrointestinal: Positive for nausea, vomiting and abdominal pain.  Genitourinary: Negative for dysuria.  Musculoskeletal: Negative for neck pain.  Neurological: Negative for headaches.  All other systems reviewed and  are negative.      Allergies  Ace inhibitors; Lisinopril; Mushroom extract complex; and Tetanus toxoids  Home Medications   Current Outpatient Rx  Name  Route  Sig  Dispense  Refill  . allopurinol (ZYLOPRIM) 100 MG tablet   Oral   Take 100 mg by mouth daily.         [name] Kitchen. aspirin 81 MG tablet   Oral   Take 81 mg by mouth daily.         [name] Kitchen. azithromycin (ZITHROMAX) 250 MG tablet   Oral   Take 250 mg by mouth daily.         . Fluticasone-Salmeterol (ADVAIR) 250-50 MCG/DOSE AEPB   Inhalation   Inhale 1 puff into the lungs daily as needed (for shortness of breath).         [name] Kitchen. glipiZIDE (GLUCOTROL) 10 MG tablet   Oral   Take 10 mg by mouth 2 (two) times daily before a meal.         . hydrochlorothiazide (HYDRODIURIL) 25 MG tablet   Oral   Take 1 tablet (25 mg total) by mouth daily.   30 tablet   0   . loratadine (CLARITIN) 10 MG tablet   Oral   Take 10 mg by mouth daily.         . metFORMIN (GLUCOPHAGE) 500 MG tablet   Oral   Take 500 mg by mouth 2 (two) times daily with a meal.         .  Multiple Vitamin (MULTIVITAMIN WITH MINERALS) TABS   Oral   Take 1 tablet by mouth daily.         . pseudoephedrine-guaifenesin (MUCINEX D) 60-600 MG per tablet   Oral   Take 1 tablet by mouth daily as needed for congestion.         . simvastatin (ZOCOR) 10 MG tablet   Oral   Take 10 mg by mouth at bedtime.         [name] Kitchen albuterol (PROVENTIL HFA;VENTOLIN HFA) 108 (90 BASE) MCG/ACT inhaler   Inhalation   Inhale 2 puffs into the lungs every 6 (six) hours as needed. For shortness of breath or wheezing          BP 132/83  Pulse 101  Temp(Src) 98.3 F (36.8 C) (Oral)  Resp 16  Ht 5\' 2"  (1.575 m)  SpO2 98% Physical Exam  Nursing note and vitals reviewed. Constitutional: She is oriented to person, place, and time. She appears well-developed and well-nourished.  HENT:  Head: Normocephalic and atraumatic.  Eyes: EOM are normal. Pupils are equal, round, and  reactive to light.  Neck: Neck supple.  Cardiovascular: Normal rate, regular rhythm and normal heart sounds.   No murmur heard. Pulmonary/Chest: Effort normal. No respiratory distress.  Abdominal: Soft. She exhibits no distension. There is tenderness. There is guarding. There is no rebound.  Neurological: She is alert and oriented to person, place, and time.  Skin: Skin is warm and dry.    ED Course  Procedures (including critical care time) Labs Review Labs Reviewed  CBC WITH DIFFERENTIAL - Abnormal; Notable for the following:    WBC 18.2 (*)    Neutrophils Relative % 81 (*)    Neutro Abs 14.7 (*)    Lymphocytes Relative 11 (*)    Monocytes Absolute 1.4 (*)    All other components within normal limits  COMPREHENSIVE METABOLIC PANEL - Abnormal; Notable for the following:    Glucose, Bld 270 (*)    BUN 24 (*)    Creatinine, Ser 1.35 (*)    GFR calc non Af Amer 41 (*)    GFR calc Af Amer 47 (*)    All other components within normal limits  URINALYSIS, ROUTINE W REFLEX MICROSCOPIC - Abnormal; Notable for the following:    APPearance CLOUDY (*)    pH 8.5 (*)    Ketones, ur >80 (*)    Leukocytes, UA MODERATE (*)    All other components within normal limits  URINE MICROSCOPIC-ADD ON - Abnormal; Notable for the following:    Squamous Epithelial / LPF MANY (*)    Bacteria, UA FEW (*)    All other components within normal limits  CG4 I-STAT (LACTIC ACID) - Abnormal; Notable for the following:    Lactic Acid, Venous 3.72 (*)    All other components within normal limits  LIPASE, BLOOD   Imaging Review Ct Abdomen Pelvis W Contrast  01/28/2014   CLINICAL DATA:  63 year old female epigastric pain nausea vomiting. Initial encounter.  EXAM: CT ABDOMEN AND PELVIS WITH CONTRAST  TECHNIQUE: Multidetector CT imaging of the abdomen and pelvis was performed using the standard protocol following bolus administration of intravenous contrast.  CONTRAST:  OMNIPAQUE IOHEXOL 300 MG/ML  SOLN   COMPARISON:  CT Abdomen and Pelvis 01/16/2011.  FINDINGS: No pericardial or pleural effusion.  Minor lung base atelectasis.  No acute osseous abnormality identified. Degenerative changes in the lumbar spine.  No pelvic free fluid. Negative bladder. Decompressed rectum. Uterus and  adnexa appear surgically absent.  Negative sigmoid colon except for occasional diverticula. Negative left colon. Small calcification probably of an epiploic appendage, unchanged. Negative transverse colon. Negative right colon. Appendix not identified, favor surgically absent. No dilated small bowel. Negative terminal ileum. Small hiatal hernia. Largely decompressed stomach. Negative duodenum.  Further decreased density throughout the liver. Negative gallbladder, spleen, pancreas, adrenal glands, portal venous system, and major arterial structures in the abdomen and pelvis. Kidneys appear stable and within normal limits. No abdominal free fluid. No lymphadenopathy. Large body habitus. No focal inflammatory stranding.  IMPRESSION: No acute or inflammatory process identified in the abdomen or pelvis. Progressed hepatic steatosis.   Electronically Signed   By: Augusto Gamble M.D.   On: 01/28/2014 07:05   Dg Abd Acute W/chest  01/28/2014   CLINICAL DATA:  63 year old female abdominal pain nausea vomiting. Initial encounter.  EXAM: ACUTE ABDOMEN SERIES (ABDOMEN 2 VIEW & CHEST 1 VIEW)  COMPARISON:  CT Abdomen and Pelvis to 02/2011 and earlier.  FINDINGS: Low lung volumes. Normal cardiac size and mediastinal contours. No pneumothorax or pneumoperitoneum. No confluent pulmonary opacity. Suggestion of a small hiatal hernia.  Non obstructed bowel gas pattern. Small calcified epiploic appendage is unchanged and inconsequential. Negative abdominal and pelvic visceral contours. Pelvic phleboliths. No acute osseous abnormality identified.  IMPRESSION: Non obstructed bowel gas pattern, no free air.  Low lung volumes.   Electronically Signed   By: Augusto Gamble  M.D.   On: 01/28/2014 04:56      MDM   Final diagnoses:  Persistent vomiting  Abdominal pain  UTI (lower urinary tract infection)    Pt comes in with cc of abd pain, nausea and emesis. Exam in tenderness with guarding. CT ordered - and is neg for acute process. PO challenge initiated. UTI seen, antibiotics started. If po challenge fails - will admit.  Derwood Kaplan, MD 01/30/14 [phone]

## 2014-01-29 LAB — GLUCOSE, CAPILLARY
Glucose-Capillary: 143 mg/dL — ABNORMAL HIGH (ref 70–99)
Glucose-Capillary: 139 mg/dL — ABNORMAL HIGH (ref 70–99)
Glucose-Capillary: 144 mg/dL — ABNORMAL HIGH (ref 70–99)
Glucose-Capillary: 178 mg/dL — ABNORMAL HIGH (ref 70–99)

## 2014-01-29 LAB — LIPID PANEL
CHOL/HDL RATIO: 3 ratio
Cholesterol: 105 mg/dL (ref 0–200)
HDL: 35 mg/dL — ABNORMAL LOW (ref >39)
LDL Cholesterol: 53 mg/dL (ref 0–99)
Triglycerides: 85 mg/dL (ref <150)
VLDL: 17 mg/dL (ref 0–40)

## 2014-01-29 LAB — BASIC METABOLIC PANEL
BUN: 19 mg/dL (ref 6–23)
CALCIUM: 8 mg/dL — AB (ref 8.4–10.5)
CO2: 23 mEq/L (ref 19–32)
CREATININE: 1.42 mg/dL — AB (ref 0.50–1.10)
Chloride: 104 mEq/L (ref 96–112)
GFR calc Af Amer: 44 mL/min — ABNORMAL LOW (ref >90)
GFR, EST NON AFRICAN AMERICAN: 38 mL/min — AB (ref >90)
GLUCOSE: 154 mg/dL — AB (ref 70–99)
Potassium: 3.7 mEq/L (ref 3.7–5.3)
Sodium: 141 mEq/L (ref 137–147)

## 2014-01-29 LAB — URINE CULTURE
Colony Count: 15000
SPECIAL REQUESTS: NORMAL

## 2014-01-29 LAB — CBC
HCT: 34.9 % — ABNORMAL LOW (ref 36.0–46.0)
HEMOGLOBIN: 11.6 g/dL — AB (ref 12.0–15.0)
MCH: 31.9 pg (ref 26.0–34.0)
MCHC: 33.2 g/dL (ref 30.0–36.0)
MCV: 95.9 fL (ref 78.0–100.0)
PLATELETS: 228 10*3/uL (ref 150–400)
RBC: 3.64 MIL/uL — ABNORMAL LOW (ref 3.87–5.11)
RDW: 13 % (ref 11.5–15.5)
WBC: 7.9 10*3/uL (ref 4.0–10.5)

## 2014-01-29 LAB — LACTIC ACID, PLASMA: LACTIC ACID, VENOUS: 1.3 mmol/L (ref 0.5–2.2)

## 2014-01-29 MED ORDER — SODIUM CHLORIDE 0.9 % IV SOLN
INTRAVENOUS | Status: DC
  Administered 2014-01-29 (×2): via INTRAVENOUS

## 2014-01-29 MED ORDER — "BD GETTING STARTED TAKE HOME KIT: 1ML X 30 G SYRINGES, "
1.0000 | Freq: Once | Status: AC
  Administered 2014-01-29: 1
  Filled 2014-01-29: qty 1

## 2014-01-29 MED ORDER — BD GETTING STARTED TAKE HOME KIT: 1/2ML X 30G SYRINGES: 1.0000 | Freq: Once | Status: DC

## 2014-01-29 NOTE — Progress Notes (Signed)
Nutrition Brief Note  Patient identified on the Malnutrition Screening Tool (MST) Report  Wt Readings from Last 15 Encounters:  01/28/14 199 lb 15.3 oz (90.7 kg)  01/17/13 200 lb (90.719 kg)    Body mass index is 36.8 kg/(m^2). Patient meets criteria for Obesity based on current BMI.   Current diet order is Carb Modified, patient is consuming approximately 50-75% of meals at this time. Pt denies any recent unintentional weight loss. She states that she lost 24 lbs over the past year intentionally. Pt reports eating well PTA. Decreased appetite today but, she reports nausea has resolved. Encouraged PO intake. Labs and medications reviewed.   No nutrition interventions warranted at this time. If nutrition issues arise, please consult RD.   Ian Malkineanne Barnett RD, LDN Inpatient Clinical Dietitian Pager: [phone] After Hours Pager: [phone]

## 2014-01-29 NOTE — Progress Notes (Signed)
[facility] PROGRESS NOTE  Cheryl Gill ZOX:[id number] DOB: 04-12-1951 DOA: 01/28/2014 PCP: Dorrene GermanAVBUERE,[name], MD  Assessment/Plan: 63 y.o. female past medical history significant for diabetes, hypertension, hypercholesterolemia, chronic asthma, CKD, DM and seasonal allergies; came to the hospital complaining of suprapubic abdominal pain, nausea/vomiting admitted with UTI  1. UTI/leukocytosis; started on atx; pend cultures  2. HTN, started on amlodipine;  hold hctz;   3. Nausea, vomiting, diarrhea and Abdominal pain; CT abd: No acute or inflammatory process identified in the abdomen or pelvis -symptoms improving; advance diet; check c diff recent atx exposure prior to admission   4. DM HA1C-8.4; d/c metformin due to progressive CKD/acidosis;  -ISS, lantus; monitor   5. Asthma stable; cont bronchodilators   Code Status: full Family Communication: d/w aptient (indicate person spoken with, relationship, and if by phone, the number) Disposition Plan: home in 24-*48 hours    Consultants:  None   Procedures:  None   Antibiotics:  Ceftriaxone 2/15<<<<<   (indicate start date, and stop date if known)  HPI/Subjective: alert  Objective: Filed Vitals:   01/29/14 1006  BP: 120/70  Pulse:   Temp:   Resp:     Intake/Output Summary (Last 24 hours) at 01/29/14 1019 Last data filed at 01/28/14 1800  Gross per 24 hour  Intake    575 ml  Output    325 ml  Net    250 ml   Filed Weights   01/28/14 1130 01/28/14 1210  Weight: 90.7 kg (199 lb 15.3 oz) 90.7 kg (199 lb 15.3 oz)    Exam:   General:  alert  Cardiovascular: s1,s2 rrr  Respiratory: CTA BL  Abdomen: soft, nt, nd   Musculoskeletal: no LE edema   Data Reviewed: Basic Metabolic Panel:  Recent Labs Lab 01/28/14 0337 01/28/14 1429 01/29/14 0716  NA 141  --  141  K 4.0  --  3.7  CL 99  --  104  CO2 20  --  23  GLUCOSE 270*  --  154*  BUN 24*  --  19  CREATININE 1.35*  --  1.42*  CALCIUM  9.5  --  8.0*  MG  --  1.9  --    Liver Function Tests:  Recent Labs Lab 01/28/14 0337  AST 23  ALT 27  ALKPHOS 90  BILITOT 0.5  PROT 8.0  ALBUMIN 4.3    Recent Labs Lab 01/28/14 0337  LIPASE 45   No results found for this basename: AMMONIA,  in the last 168 hours CBC:  Recent Labs Lab 01/28/14 0337 01/29/14 0716  WBC 18.2* 7.9  NEUTROABS 14.7*  --   HGB 13.7 11.6*  HCT 38.9 34.9*  MCV 93.5 95.9  PLT 286 228   Cardiac Enzymes: No results found for this basename: CKTOTAL, CKMB, CKMBINDEX, TROPONINI,  in the last 168 hours BNP (last 3 results) No results found for this basename: PROBNP,  in the last 8760 hours CBG:  Recent Labs Lab 01/28/14 1726 01/28/14 2208 01/29/14 0746  GLUCAP 130* 115* 143*    No results found for this or any previous visit (from the past 240 hour(s)).   Studies: Ct Abdomen Pelvis W Contrast  01/28/2014   CLINICAL DATA:  [age] year old female epigastric pain nausea vomiting. Initial encounter.  EXAM: CT ABDOMEN AND PELVIS WITH CONTRAST  TECHNIQUE: Multidetector CT imaging of the abdomen and pelvis was performed using the standard protocol following bolus administration of intravenous contrast.  CONTRAST:  100mL OMNIPAQUE IOHEXOL 300 MG/ML  SOLN  COMPARISON:  CT Abdomen and Pelvis 01/16/2011.  FINDINGS: No pericardial or pleural effusion.  Minor lung base atelectasis.  No acute osseous abnormality identified. Degenerative changes in the lumbar spine.  No pelvic free fluid. Negative bladder. Decompressed rectum. Uterus and adnexa appear surgically absent.  Negative sigmoid colon except for occasional diverticula. Negative left colon. Small calcification probably of an epiploic appendage, unchanged. Negative transverse colon. Negative right colon. Appendix not identified, favor surgically absent. No dilated small bowel. Negative terminal ileum. Small hiatal hernia. Largely decompressed stomach. Negative duodenum.  Further decreased density throughout  the liver. Negative gallbladder, spleen, pancreas, adrenal glands, portal venous system, and major arterial structures in the abdomen and pelvis. Kidneys appear stable and within normal limits. No abdominal free fluid. No lymphadenopathy. Large body habitus. No focal inflammatory stranding.  IMPRESSION: No acute or inflammatory process identified in the abdomen or pelvis. Progressed hepatic steatosis.   Electronically Signed   By: Augusto Gamble M.D.   On: 01/28/2014 07:05   Dg Abd Acute W/chest  01/28/2014   CLINICAL DATA:  63 year old female abdominal pain nausea vomiting. Initial encounter.  EXAM: ACUTE ABDOMEN SERIES (ABDOMEN 2 VIEW & CHEST 1 VIEW)  COMPARISON:  CT Abdomen and Pelvis to 02/2011 and earlier.  FINDINGS: Low lung volumes. Normal cardiac size and mediastinal contours. No pneumothorax or pneumoperitoneum. No confluent pulmonary opacity. Suggestion of a small hiatal hernia.  Non obstructed bowel gas pattern. Small calcified epiploic appendage is unchanged and inconsequential. Negative abdominal and pelvic visceral contours. Pelvic phleboliths. No acute osseous abnormality identified.  IMPRESSION: Non obstructed bowel gas pattern, no free air.  Low lung volumes.   Electronically Signed   By: Augusto Gamble M.D.   On: 01/28/2014 04:56    Scheduled Meds: . allopurinol  100 mg Oral Daily  . amLODipine  5 mg Oral Daily  . aspirin  81 mg Oral Daily  . cefTRIAXone (ROCEPHIN)  IV  1 g Intravenous Q24H  . enoxaparin (LOVENOX) injection  40 mg Subcutaneous Q24H  . insulin aspart  0-15 Units Subcutaneous TID WC  . insulin detemir  5 Units Subcutaneous QHS  . loratadine  10 mg Oral Daily  . mometasone-formoterol  2 puff Inhalation BID  . multivitamin with minerals  1 tablet Oral Daily  . pantoprazole  40 mg Oral Q1200  . simvastatin  10 mg Oral QHS   Continuous Infusions:   Principal Problem:   UTI (lower urinary tract infection) Active Problems:   HTN (hypertension)   Diabetes mellitus type 2,  uncontrolled, without complications   Persistent vomiting   Abdominal pain   Lactic acidosis   Asthma, chronic   HLD (hyperlipidemia)   Leukocytosis, unspecified    Time spent: >35 minutes     Esperanza Sheets  [facility] Pager [phone]. If 7PM-7AM, please contact night-coverage at [email or web address], password [facility] 01/29/2014, 10:19 AM  LOS: 1 day

## 2014-01-29 NOTE — Progress Notes (Signed)
Inpatient Diabetes Program Recommendations  AACE/ADA: New Consensus Statement on Inpatient Glycemic Control (2013)  Target Ranges:  Prepandial:   less than 140 mg/dL      Peak postprandial:   less than 180 mg/dL (1-2 hours)      Critically ill patients:  140 - 180 mg/dL     Results for Cheryl Gill, Cheryl Gill (MRN [id number]) as of 01/29/2014 11:20  Ref. Range 01/28/2014 17:26 01/28/2014 22:08 01/29/2014 07:46  Glucose-Capillary Latest Range: 70-99 mg/dL 409130 (H) 811115 (H) 914143 (H)    Results for Cheryl Gill, Cheryl Gill (MRN [id number]) as of 01/29/2014 11:20  Ref. Range 01/28/2014 16:20  Hemoglobin A1C Latest Range: <5.7 % 8.4 (H)    Diabetes history: Type 2 DM Outpatient Diabetes medications: Glipizide 10 mg bid + Metformin 500 mg bid Current orders for Inpatient glycemic control: Levemir 5 units QHS + Novolog Moderate SSI   **Noted from MD notes that Metformin will be stopped from patient's home medications at time of d/c due to patient having issues with lactic acidosis/CKD  **Will have RNs begin insulin instruction with patient in case she is d/c'd home on insulin  **Note that patient does not have insurance and is self-pay.  Will not be able to afford Lantus or Levemir or Novolog at d/c.  If decision made to send patient home on insulin, recommend NPH and Regular or 70/30 insulin.  These three insulins (NPH, Regular, and 70/30 insulins) [facility].   Will follow. Ambrose FinlandJeannine Johnston Davielle Lingelbach RN, MSN, CDE Diabetes Coordinator Inpatient Diabetes Program Team Pager: [phone] (8a-10p)

## 2014-01-30 LAB — GLUCOSE, CAPILLARY: Glucose-Capillary: 157 mg/dL — ABNORMAL HIGH (ref 70–99)

## 2014-01-30 LAB — CLOSTRIDIUM DIFFICILE BY PCR: Toxigenic C. Difficile by PCR: NEGATIVE

## 2014-01-30 MED ORDER — LEVOFLOXACIN 750 MG PO TABS: 750.0000 mg | ORAL_TABLET | Freq: Every day | ORAL | Status: DC

## 2014-01-30 MED ORDER — AMLODIPINE BESYLATE 5 MG PO TABS
5.0000 mg | ORAL_TABLET | Freq: Every day | ORAL | Status: DC
Start: 2014-01-30 — End: 2014-01-30

## 2014-01-30 NOTE — Progress Notes (Signed)
DC home with family, verbally understood dc instructions, no questions asked 

## 2014-01-30 NOTE — Discharge Summary (Signed)
Physician Discharge Summary  Cheryl Gill ZDG:[id number] DOB: 08/09/1951 DOA: 01/28/2014  PCP: Dorrene German, MD  Admit date: 01/28/2014 Discharge date: 01/30/2014  Time spent:>35 minutes  Recommendations for Outpatient Follow-up:  F/u with PCP in 1 week  Discharge Diagnoses:  Principal Problem:   UTI (lower urinary tract infection) Active Problems:   HTN (hypertension)   Diabetes mellitus type 2, uncontrolled, without complications   Persistent vomiting   Abdominal pain   Lactic acidosis   Asthma, chronic   HLD (hyperlipidemia)   Leukocytosis, unspecified   Discharge Condition: stable   Diet recommendation: DM, heart healthy   Filed Weights   01/28/14 1130 01/28/14 1210  Weight: 90.7 kg (199 lb 15.3 oz) 90.7 kg (199 lb 15.3 oz)    History of present illness:  63 y.o. female past medical history significant for diabetes, hypertension, hypercholesterolemia, chronic asthma, CKD, DM and seasonal allergies; came to the hospital complaining of suprapubic abdominal pain, nausea/vomiting admitted with UTI   Hospital Course:  1. UTI/leukocytosis; cultures multiple organisms  -afebrile, improved on IV atx; changed to PO ayx to compete outpatient treatment  2. HTN, hold hctz due to mild AKI; BP is stable off meds; recommended to f/u with PCP next week to reevaluate for medication treatemtnas needed  3. Nausea, vomiting, diarrhea and Abdominal pain; CT abd: No acute or inflammatory process identified in the abdomen or pelvis  -symptoms resolved; initial two episodes of diarrhea which resolved completely; pend  c diff  4. DM HA1C-8.4; d/c metformin due to progressive CKD/acidosis;  -cont glipizide; recommended to f/uw ith PCP next week  5. Asthma stable; cont bronchodilators    Procedures:  None  (i.e. Studies not automatically included, echos, thoracentesis, etc; not x-rays)  Consultations:  None   Discharge Exam: Filed Vitals:   01/30/14 0607  BP: 107/66  Pulse: 92   Temp: 98.5 F (36.9 C)  Resp: 18    General: alert Cardiovascular: s1,s2 rrr Respiratory: CTA BL  Discharge Instructions  Discharge Orders   Future Orders Complete By Expires   Diet - low sodium heart healthy  As directed    Discharge instructions  As directed    Comments:     Please follow up with primary care doctor in 1-2 weeks   Increase activity slowly  As directed        Medication List    STOP taking these medications       azithromycin 250 MG tablet  Commonly known as:  ZITHROMAX     hydrochlorothiazide 25 MG tablet  Commonly known as:  HYDRODIURIL     metFORMIN 500 MG tablet  Commonly known as:  GLUCOPHAGE      TAKE these medications       albuterol 108 (90 BASE) MCG/ACT inhaler  Commonly known as:  PROVENTIL HFA;VENTOLIN HFA  Inhale 2 puffs into the lungs every 6 (six) hours as needed. For shortness of breath or wheezing     allopurinol 100 MG tablet  Commonly known as:  ZYLOPRIM  Take 100 mg by mouth daily.     aspirin 81 MG tablet  Take 81 mg by mouth daily.     Fluticasone-Salmeterol 250-50 MCG/DOSE Aepb  Commonly known as:  ADVAIR  Inhale 1 puff into the lungs daily as needed (for shortness of breath).     glipiZIDE 10 MG tablet  Commonly known as:  GLUCOTROL  Take 10 mg by mouth 2 (two) times daily before a meal.     levofloxacin 750 MG  tablet  Commonly known as:  LEVAQUIN  Take 1 tablet (750 mg total) by mouth daily.     loratadine 10 MG tablet  Commonly known as:  CLARITIN  Take 10 mg by mouth daily.     multivitamin with minerals Tabs tablet  Take 1 tablet by mouth daily.     pseudoephedrine-guaifenesin 60-600 MG per tablet  Commonly known as:  MUCINEX D  Take 1 tablet by mouth daily as needed for congestion.     simvastatin 10 MG tablet  Commonly known as:  ZOCOR  Take 10 mg by mouth at bedtime.       Allergies  Allergen Reactions  . Ace Inhibitors Anaphylaxis  . Lisinopril Anaphylaxis  . Mushroom Extract Complex  Swelling  . Tetanus Toxoids Nausea And Vomiting and Swelling       Follow-up Information   Follow up with [name],[name], MD In 1 week.   Specialty:  Internal Medicine   Contact information:   [address] [place] [address] [phone]        The results of significant diagnostics from this hospitalization (including imaging, microbiology, ancillary and laboratory) are listed below for reference.    Significant Diagnostic Studies: Ct Abdomen Pelvis W Contrast  01/28/2014   CLINICAL DATA:  63 year old female epigastric pain nausea vomiting. Initial encounter.  EXAM: CT ABDOMEN AND PELVIS WITH CONTRAST  TECHNIQUE: Multidetector CT imaging of the abdomen and pelvis was performed using the standard protocol following bolus administration of intravenous contrast.  CONTRAST:  100mL OMNIPAQUE IOHEXOL 300 MG/ML  SOLN  COMPARISON:  CT Abdomen and Pelvis 01/16/2011.  FINDINGS: No pericardial or pleural effusion.  Minor lung base atelectasis.  No acute osseous abnormality identified. Degenerative changes in the lumbar spine.  No pelvic free fluid. Negative bladder. Decompressed rectum. Uterus and adnexa appear surgically absent.  Negative sigmoid colon except for occasional diverticula. Negative left colon. Small calcification probably of an epiploic appendage, unchanged. Negative transverse colon. Negative right colon. Appendix not identified, favor surgically absent. No dilated small bowel. Negative terminal ileum. Small hiatal hernia. Largely decompressed stomach. Negative duodenum.  Further decreased density throughout the liver. Negative gallbladder, spleen, pancreas, adrenal glands, portal venous system, and major arterial structures in the abdomen and pelvis. Kidneys appear stable and within normal limits. No abdominal free fluid. No lymphadenopathy. Large body habitus. No focal inflammatory stranding.  IMPRESSION: No acute or inflammatory process identified in the abdomen or pelvis.  Progressed hepatic steatosis.   Electronically Signed   By: Augusto GambleLee  Hall M.D.   On: 01/28/2014 07:05   Dg Abd Acute W/chest  01/28/2014   CLINICAL DATA:  63 year old female abdominal pain nausea vomiting. Initial encounter.  EXAM: ACUTE ABDOMEN SERIES (ABDOMEN 2 VIEW & CHEST 1 VIEW)  COMPARISON:  CT Abdomen and Pelvis to 02/2011 and earlier.  FINDINGS: Low lung volumes. Normal cardiac size and mediastinal contours. No pneumothorax or pneumoperitoneum. No confluent pulmonary opacity. Suggestion of a small hiatal hernia.  Non obstructed bowel gas pattern. Small calcified epiploic appendage is unchanged and inconsequential. Negative abdominal and pelvic visceral contours. Pelvic phleboliths. No acute osseous abnormality identified.  IMPRESSION: Non obstructed bowel gas pattern, no free air.  Low lung volumes.   Electronically Signed   By: Augusto GambleLee  Hall M.D.   On: 01/28/2014 04:56    Microbiology: Recent Results (from the past 240 hour(s))  URINE CULTURE     Status: None   Collection Time    01/28/14  4:15 AM  Result Value Ref Range Status   Specimen Description URINE, RANDOM   Final   Special Requests Normal   Final   Culture  Setup Time     Final   Value: 01/29/2014 01:47     Performed at [facility]   Colony Count     Final   Value: 15,000 COLONIES/ML     Performed at [facility]   Culture     Final   Value: Multiple bacterial morphotypes present, none predominant. Suggest appropriate recollection if clinically indicated.     Performed at [facility]   Report Status 01/29/2014 FINAL   Final     Labs: Basic Metabolic Panel:  Recent Labs Lab 01/28/14 0337 01/28/14 1429 01/29/14 0716  NA 141  --  141  K 4.0  --  3.7  CL 99  --  104  CO2 20  --  23  GLUCOSE 270*  --  154*  BUN 24*  --  19  CREATININE 1.35*  --  1.42*  CALCIUM 9.5  --  8.0*  MG  --  1.9  --    Liver Function Tests:  Recent Labs Lab 01/28/14 0337  AST 23  ALT 27  ALKPHOS 90   BILITOT 0.5  PROT 8.0  ALBUMIN 4.3    Recent Labs Lab 01/28/14 0337  LIPASE 45   No results found for this basename: AMMONIA,  in the last 168 hours CBC:  Recent Labs Lab 01/28/14 0337 01/29/14 0716  WBC 18.2* 7.9  NEUTROABS 14.7*  --   HGB 13.7 11.6*  HCT 38.9 34.9*  MCV 93.5 95.9  PLT 286 228   Cardiac Enzymes: No results found for this basename: CKTOTAL, CKMB, CKMBINDEX, TROPONINI,  in the last 168 hours BNP: BNP (last 3 results) No results found for this basename: PROBNP,  in the last 8760 hours CBG:  Recent Labs Lab 01/28/14 2208 01/29/14 0746 01/29/14 1201 01/29/14 1649 01/29/14 2310  GLUCAP 115* 143* 139* 144* 178*       Signed:  Aimee Timmons N  [facility] 01/30/2014, 10:03 AM

## 2014-01-31 LAB — GLUCOSE, CAPILLARY: Glucose-Capillary: 152 mg/dL — ABNORMAL HIGH (ref 70–99)

## 2014-03-15 ENCOUNTER — Other Ambulatory Visit: Payer: Self-pay

## 2014-03-15 DIAGNOSIS — Z1231 Encounter for screening mammogram for malignant neoplasm of breast: Secondary | ICD-10-CM

## 2014-03-23 ENCOUNTER — Ambulatory Visit: Payer: BC Managed Care – PPO

## 2014-04-03 ENCOUNTER — Other Ambulatory Visit (HOSPITAL_COMMUNITY): Payer: Self-pay | Admitting: Internal Medicine

## 2014-04-03 ENCOUNTER — Ambulatory Visit (HOSPITAL_COMMUNITY)
Admission: RE | Admit: 2014-04-03 | Discharge: 2014-04-03 | Disposition: A | Discharge status: Home / Self Care | Payer: No Typology Code available for payment source | Source: Ambulatory Visit | Attending: Internal Medicine | Admitting: Internal Medicine

## 2014-04-03 DIAGNOSIS — R52 Pain, unspecified: Secondary | ICD-10-CM

## 2014-04-03 DIAGNOSIS — M25579 Pain in unspecified ankle and joints of unspecified foot: Secondary | ICD-10-CM | POA: Insufficient documentation

## 2014-04-17 ENCOUNTER — Encounter (INDEPENDENT_AMBULATORY_CARE_PROVIDER_SITE_OTHER): Payer: Self-pay

## 2014-04-17 ENCOUNTER — Ambulatory Visit
Admission: RE | Admit: 2014-04-17 | Discharge: 2014-04-17 | Disposition: A | Discharge status: Home / Self Care | Payer: No Typology Code available for payment source | Source: Ambulatory Visit

## 2014-04-17 DIAGNOSIS — Z1231 Encounter for screening mammogram for malignant neoplasm of breast: Secondary | ICD-10-CM

## 2014-05-22 ENCOUNTER — Encounter (INDEPENDENT_AMBULATORY_CARE_PROVIDER_SITE_OTHER): Payer: Self-pay

## 2014-05-22 ENCOUNTER — Ambulatory Visit (HOSPITAL_COMMUNITY)
Admission: RE | Admit: 2014-05-22 | Discharge: 2014-05-22 | Disposition: A | Discharge status: Home / Self Care | Payer: No Typology Code available for payment source | Source: Ambulatory Visit | Attending: Internal Medicine | Admitting: Internal Medicine

## 2014-05-22 ENCOUNTER — Other Ambulatory Visit (HOSPITAL_COMMUNITY): Payer: Self-pay | Admitting: Internal Medicine

## 2014-05-22 DIAGNOSIS — R079 Chest pain, unspecified: Secondary | ICD-10-CM

## 2014-05-22 DIAGNOSIS — I10 Essential (primary) hypertension: Secondary | ICD-10-CM | POA: Insufficient documentation

## 2014-06-13 ENCOUNTER — Emergency Department (INDEPENDENT_AMBULATORY_CARE_PROVIDER_SITE_OTHER)
Admission: EM | Admit: 2014-06-13 | Discharge: 2014-06-13 | Disposition: A | Discharge status: Home / Self Care | Payer: Medicare Other | Source: Home / Self Care | Attending: Emergency Medicine | Admitting: Emergency Medicine

## 2014-06-13 ENCOUNTER — Encounter (HOSPITAL_COMMUNITY): Payer: Self-pay | Admitting: Emergency Medicine

## 2014-06-13 DIAGNOSIS — J309 Allergic rhinitis, unspecified: Secondary | ICD-10-CM

## 2014-06-13 LAB — POCT RAPID STREP A: Streptococcus, Group A Screen (Direct): NEGATIVE

## 2014-06-13 MED ORDER — IPRATROPIUM BROMIDE 0.06 % NA SOLN: 2.0000 | Freq: Four times a day (QID) | NASAL | Status: DC

## 2014-06-13 NOTE — ED Provider Notes (Signed)
CSN: [id number]     Arrival date & time 06/13/14  [id number] History   First MD Initiated Contact with Patient 06/13/14 [phone]     Chief Complaint  Patient presents with  . URI   (Consider location/radiation/quality/duration/timing/severity/associated sxs/prior Treatment) HPI Comments: Patient reports 3-4 weeks of nasal congestion with associated rhinorrhea. States she has to blow her nose so often she sometimes sees bloody mucous. Has been taking her Zyrtec as prescribed and was advised by her PCP to begin using Flonase, but she has not done so as of yet.    The history is provided by the patient.    Past Medical History  Diagnosis Date  . Diabetes mellitus   . Hypertension   . Hypercholesterolemia   . Seasonal allergies    Past Surgical History  Procedure Laterality Date  . Abdominal hysterectomy     No family history on file. History  Substance Use Topics  . Smoking status: Never Smoker   . Smokeless tobacco: Never Used  . Alcohol Use: No   OB History   Grav Para Term Preterm Abortions TAB SAB Ect Mult Living                 Review of Systems  Constitutional: Negative for fever, chills and fatigue.  HENT: Positive for congestion, postnasal drip and rhinorrhea. Negative for ear discharge, ear pain, sneezing, sore throat, tinnitus, trouble swallowing and voice change.   Respiratory: Negative.   Cardiovascular: Negative.   Gastrointestinal: Negative.     Allergies  Ace inhibitors; Lisinopril; Mushroom extract complex; and Tetanus toxoids  Home Medications   Prior to Admission medications   Medication Sig Start Date End Date Taking? Authorizing Provider  glipiZIDE (GLUCOTROL) 10 MG tablet Take 10 mg by mouth 2 (two) times daily before a meal.   Yes Historical Provider, MD  levofloxacin (LEVAQUIN) 750 MG tablet Take 1 tablet (750 mg total) by mouth daily. 01/30/14  Yes Esperanza SheetsUlugbek N Buriev, MD  metFORMIN (GLUCOPHAGE) 500 MG tablet Take by mouth 2 (two) times daily with a meal.    Yes Historical Provider, MD  albuterol (PROVENTIL HFA;VENTOLIN HFA) 108 (90 BASE) MCG/ACT inhaler Inhale 2 puffs into the lungs every 6 (six) hours as needed. For shortness of breath or wheezing    Historical Provider, MD  allopurinol (ZYLOPRIM) 100 MG tablet Take 100 mg by mouth daily.    Historical Provider, MD  aspirin 81 MG tablet Take 81 mg by mouth daily.    Historical Provider, MD  Fluticasone-Salmeterol (ADVAIR) 250-50 MCG/DOSE AEPB Inhale 1 puff into the lungs daily as needed (for shortness of breath).    Historical Provider, MD  ipratropium (ATROVENT) 0.06 % nasal spray Place 2 sprays into both nostrils 4 (four) times daily. 06/13/14   Ardis RowanJennifer Lee Presson, PA  loratadine (CLARITIN) 10 MG tablet Take 10 mg by mouth daily.    Historical Provider, MD  Multiple Vitamin (MULTIVITAMIN WITH MINERALS) TABS Take 1 tablet by mouth daily.    Historical Provider, MD  pseudoephedrine-guaifenesin (MUCINEX D) 60-600 MG per tablet Take 1 tablet by mouth daily as needed for congestion.    Historical Provider, MD  simvastatin (ZOCOR) 10 MG tablet Take 10 mg by mouth at bedtime.    Historical Provider, MD   BP 151/79  Pulse 78  Temp(Src) 98.4 F (36.9 C) (Oral)  Resp 18  SpO2 100% Physical Exam  Nursing note and vitals reviewed. Constitutional: She is oriented to person, place, and time. She appears well-developed and well-nourished.  No distress.  overweight  HENT:  Head: Normocephalic and atraumatic.  Right Ear: Hearing, tympanic membrane, external ear and ear canal normal.  Left Ear: Hearing, tympanic membrane, external ear and ear canal normal.  Nose: Mucosal edema and rhinorrhea present. No nose lacerations, sinus tenderness or nasal deformity. No epistaxis.  No foreign bodies.  Mouth/Throat: Uvula is midline, oropharynx is clear and moist and mucous membranes are normal. No oral lesions. No trismus in the jaw.  Eyes: Conjunctivae are normal. No scleral icterus.  Neck: Normal range of motion.  Neck supple.  Cardiovascular: Normal rate, regular rhythm and normal heart sounds.   Pulmonary/Chest: Effort normal and breath sounds normal.  Musculoskeletal: Normal range of motion.  Lymphadenopathy:    She has no cervical adenopathy.  Neurological: She is alert and oriented to person, place, and time.  Skin: Skin is warm and dry.  Psychiatric: She has a normal mood and affect. Her behavior is normal.    ED Course  Procedures (including critical care time) Labs Review Labs Reviewed  CULTURE, GROUP A STREP  POCT RAPID STREP A ([facility] CARE ONLY)    Imaging Review No results found.   MDM   1. Allergic rhinitis, unspecified allergic rhinitis type   Given that patient is already experience some bloody mucous when she blows her nose, will treat nasal congestion with Atrovent nasal spray as opposed to Flonase. Advised to continue her Zyrtec and follow up with her PCP is no improvement.   Jess BartersJennifer Lee ImbaryPresson, [place] 06/13/14 1204

## 2014-06-13 NOTE — ED Notes (Signed)
Pt c/o cold sx onset 2 weeks Sx include: HA, congestion, SOB, cough, blood when she blows her nose Denies f/v/n/d, wheezing Alert w/no signs of acute distress.

## 2014-06-13 NOTE — Discharge Instructions (Signed)

## 2014-06-15 LAB — CULTURE, GROUP A STREP

## 2014-06-15 NOTE — ED Provider Notes (Signed)
Medical screening examination/treatment/procedure(s) were performed by non-physician practitioner and as supervising physician I was immediately available for consultation/collaboration.  Nuvia Hileman, M.D.  Odelle Kosier C Cy Bresee, MD 06/15/14 0752 

## 2014-07-09 ENCOUNTER — Ambulatory Visit: Payer: Medicaid Other | Admitting: Obstetrics

## 2014-07-17 ENCOUNTER — Encounter: Payer: Self-pay | Admitting: Obstetrics

## 2014-07-17 ENCOUNTER — Telehealth: Payer: Self-pay

## 2014-07-17 ENCOUNTER — Ambulatory Visit (INDEPENDENT_AMBULATORY_CARE_PROVIDER_SITE_OTHER): Payer: Medicare Other | Admitting: Obstetrics

## 2014-07-17 VITALS — BP 123/81 | HR 81 | Temp 97.9°F | Ht 62.0 in | Wt 195.0 lb

## 2014-07-17 DIAGNOSIS — R351 Nocturia: Secondary | ICD-10-CM | POA: Insufficient documentation

## 2014-07-17 DIAGNOSIS — R350 Frequency of micturition: Secondary | ICD-10-CM | POA: Insufficient documentation

## 2014-07-17 DIAGNOSIS — R339 Retention of urine, unspecified: Secondary | ICD-10-CM

## 2014-07-17 NOTE — Addendum Note (Signed)
Addended by: Coral CeoHARPER, CHARLES A on: 07/17/2014 02:57 PM   Modules accepted: Orders

## 2014-07-17 NOTE — Telephone Encounter (Signed)
confirmed patient appt with Dr Lorin PicketScott MacDiarned [facility] for 07/27/14 at 1:15pm - left message for patient

## 2014-07-17 NOTE — Addendum Note (Signed)
Addended by: Odessa FlemingBOHNE, Charlottie Peragine M on: 07/17/2014 04:54 PM   Modules accepted: Orders

## 2014-07-17 NOTE — Progress Notes (Signed)
Subjective:     Cheryl Gill is a 63 y.o. female here for a routine exam.  Current complaints: C/O urinary frequency and not being able to completely empty bladder.    Personal health questionnaire:  Is patient Ashkenazi Jewish, have a family history of breast and/or ovarian cancer: yes Is there a family history of uterine cancer diagnosed at age < 62, gastrointestinal cancer, urinary tract cancer, family member who is a Personnel officer syndrome-associated carrier: no Is the patient overweight and hypertensive, family history of diabetes, personal history of gestational diabetes or PCOS: no Is patient over 19, have PCOS,  family history of premature CHD under age 106, diabetes, smoke, have hypertension or peripheral artery disease:  no At any time, has a partner hit, kicked or otherwise hurt or frightened you?: no Over the past 2 weeks, have you felt down, depressed or hopeless?: no Over the past 2 weeks, have you felt little interest or pleasure in doing things?:no   Gynecologic History No LMP recorded. Patient has had a hysterectomy. Contraception: status post hysterectomy Last Pap: 2013. Results were: normal Last mammogram: 2015. Results were: normal  Obstetric History OB History  Gravida Para Term Preterm AB SAB TAB Ectopic Multiple Living  5 4 4  1 1    4     # Outcome Date GA Lbr Len/2nd Weight Sex Delivery Anes PTL Lv  5 SAB 1982        N  4 TRM 09/05/78   6 lb (2.722 kg) F SVD None  Y  3 TRM 06/10/72 [redacted]w[redacted]d  6 lb 8 oz (2.948 kg) M SVD Spinal  Y  2 TRM 12/18/66 [redacted]w[redacted]d  6 lb 7 oz (2.92 kg) M SVD Spinal  Y  1 TRM 02/27/65   6 lb 2 oz (2.778 kg) M SVD Spinal  Y      Past Medical History  Diagnosis Date  . Diabetes mellitus   . Hypertension   . Hypercholesterolemia   . Seasonal allergies     Past Surgical History  Procedure Laterality Date  . Abdominal hysterectomy      Current outpatient prescriptions:albuterol (PROVENTIL HFA;VENTOLIN HFA) 108 (90 BASE) MCG/ACT inhaler, Inhale  2 puffs into the lungs every 6 (six) hours as needed. For shortness of breath or wheezing, Disp: , Rfl: ;  aspirin 81 MG tablet, Take 81 mg by mouth daily., Disp: , Rfl: ;  colchicine 0.6 MG tablet, Take 0.6 mg by mouth 2 (two) times daily. As needed., Disp: , Rfl:  Fluticasone-Salmeterol (ADVAIR) 250-50 MCG/DOSE AEPB, Inhale 1 puff into the lungs daily as needed (for shortness of breath)., Disp: , Rfl: ;  glipiZIDE (GLUCOTROL) 10 MG tablet, Take 10 mg by mouth 2 (two) times daily before a meal., Disp: , Rfl: ;  hydrochlorothiazide (HYDRODIURIL) 25 MG tablet, Take 25 mg by mouth daily., Disp: , Rfl: ;  indapamide (LOZOL) 1.25 MG tablet, Take 1.25 mg by mouth daily., Disp: , Rfl:  ipratropium (ATROVENT) 0.06 % nasal spray, Place 2 sprays into both nostrils 4 (four) times daily., Disp: 15 mL, Rfl: 1;  linagliptin (TRADJENTA) 5 MG TABS tablet, Take 5 mg by mouth daily., Disp: , Rfl: ;  meloxicam (MOBIC) 7.5 MG tablet, Take 7.5 mg by mouth daily., Disp: , Rfl: ;  metFORMIN (GLUCOPHAGE) 500 MG tablet, Take by mouth 2 (two) times daily with a meal., Disp: , Rfl:  Multiple Vitamin (MULTIVITAMIN WITH MINERALS) TABS, Take 1 tablet by mouth daily., Disp: , Rfl: ;  pseudoephedrine-guaifenesin (MUCINEX D)  60-600 MG per tablet, Take 1 tablet by mouth daily as needed for congestion., Disp: , Rfl: ;  simvastatin (ZOCOR) 10 MG tablet, Take 10 mg by mouth at bedtime., Disp: , Rfl:  Allergies  Allergen Reactions  . Ace Inhibitors Anaphylaxis  . Lisinopril Anaphylaxis  . Mushroom Extract Complex Swelling  . Tetanus Toxoids Nausea And Vomiting and Swelling    History  Substance Use Topics  . Smoking status: Never Smoker   . Smokeless tobacco: Never Used  . Alcohol Use: Yes     Comment: Rarely     History reviewed. No pertinent family history.    Review of Systems  Constitutional: negative for fatigue and weight loss Respiratory: negative for cough and wheezing Cardiovascular: negative for chest pain, fatigue  and palpitations Gastrointestinal: negative for abdominal pain and change in bowel habits Musculoskeletal:negative for myalgias Neurological: negative for gait problems and tremors Behavioral/Psych: negative for abusive relationship, depression Endocrine: negative for temperature intolerance   Genitourinary:negative for abnormal menstrual periods, genital lesions, hot flashes, sexual problems and vaginal discharge.  Positive for urinary frequency feeling that bladder not empty after voiding.  Nocturia.  Integument/breast: negative for breast lump, breast tenderness, nipple discharge and skin lesion(s)    Objective:       BP 123/81  Pulse 81  Temp(Src) 97.9 F (36.6 C)  Ht 5\' 2"  (1.575 m)  Wt 195 lb (88.451 kg)  BMI 35.66 kg/m2 General:   alert  Skin:   no rash or abnormalities  Lungs:   clear to auscultation bilaterally  Heart:   regular rate and rhythm, S1, S2 normal, no murmur, click, rub or gallop  Breasts:   normal without suspicious masses, skin or nipple changes or axillary nodes  Abdomen:  normal findings: no organomegaly, soft, non-tender and no hernia  Pelvis:  External genitalia: normal general appearance Urinary system: urethral meatus normal and bladder without fullness, nontender Vaginal: normal without tenderness, induration or masses Cervix: absent Adnexa: not felt Uterus: absent   Lab Review Urine pregnancy test Labs reviewed no Radiologic studies reviewed no    Assessment:    Urinary frequency  Nocturia  Incomplete bladder emptying   Plan:   Referred to Urology   Education reviewed: calcium supplements, low fat, low cholesterol diet, self breast exams, weight bearing exercise and management of urinary tract problems. Follow up in: 2 years.   Meds ordered this encounter  Medications  . meloxicam (MOBIC) 7.5 MG tablet    Sig: Take 7.5 mg by mouth daily.  . hydrochlorothiazide (HYDRODIURIL) 25 MG tablet    Sig: Take 25 mg by mouth daily.  .  indapamide (LOZOL) 1.25 MG tablet    Sig: Take 1.25 mg by mouth daily.  . colchicine 0.6 MG tablet    Sig: Take 0.6 mg by mouth 2 (two) times daily. As needed.  . linagliptin (TRADJENTA) 5 MG TABS tablet    Sig: Take 5 mg by mouth daily.   No orders of the defined types were placed in this encounter.

## 2014-07-18 LAB — WET PREP BY MOLECULAR PROBE
Candida species: NEGATIVE
Gardnerella vaginalis: NEGATIVE
Trichomonas vaginosis: NEGATIVE

## 2014-07-19 LAB — URINE CULTURE

## 2014-09-10 ENCOUNTER — Ambulatory Visit: Payer: Self-pay

## 2014-09-10 DIAGNOSIS — Z006 Encounter for examination for normal comparison and control in clinical research program: Secondary | ICD-10-CM

## 2014-09-11 NOTE — Progress Notes (Signed)
[facility] EKG 

## 2014-10-15 ENCOUNTER — Encounter: Payer: Self-pay | Admitting: Obstetrics

## 2014-12-14 HISTORY — PX: TRANSTHORACIC ECHOCARDIOGRAM: SHX275

## 2014-12-17 ENCOUNTER — Inpatient Hospital Stay (HOSPITAL_COMMUNITY)
Admission: EM | Admit: 2014-12-17 | Discharge: 2014-12-27 | DRG: 234 | Disposition: A | Discharge status: Home / Self Care | Payer: Medicare Other | Attending: Thoracic Surgery (Cardiothoracic Vascular Surgery) | Admitting: Thoracic Surgery (Cardiothoracic Vascular Surgery)

## 2014-12-17 ENCOUNTER — Emergency Department (HOSPITAL_COMMUNITY): Payer: Medicare Other

## 2014-12-17 ENCOUNTER — Encounter (HOSPITAL_COMMUNITY): Payer: Self-pay | Admitting: Emergency Medicine

## 2014-12-17 DIAGNOSIS — E8770 Fluid overload, unspecified: Secondary | ICD-10-CM | POA: Diagnosis not present

## 2014-12-17 DIAGNOSIS — Z951 Presence of aortocoronary bypass graft: Secondary | ICD-10-CM

## 2014-12-17 DIAGNOSIS — N183 Chronic kidney disease, stage 3 (moderate): Secondary | ICD-10-CM | POA: Diagnosis not present

## 2014-12-17 DIAGNOSIS — E1165 Type 2 diabetes mellitus with hyperglycemia: Secondary | ICD-10-CM | POA: Diagnosis not present

## 2014-12-17 DIAGNOSIS — Z23 Encounter for immunization: Secondary | ICD-10-CM | POA: Diagnosis not present

## 2014-12-17 DIAGNOSIS — E785 Hyperlipidemia, unspecified: Secondary | ICD-10-CM | POA: Diagnosis not present

## 2014-12-17 DIAGNOSIS — Z01810 Encounter for preprocedural cardiovascular examination: Secondary | ICD-10-CM | POA: Diagnosis not present

## 2014-12-17 DIAGNOSIS — I2510 Atherosclerotic heart disease of native coronary artery without angina pectoris: Secondary | ICD-10-CM | POA: Diagnosis not present

## 2014-12-17 DIAGNOSIS — R079 Chest pain, unspecified: Secondary | ICD-10-CM | POA: Diagnosis present

## 2014-12-17 DIAGNOSIS — I214 Non-ST elevation (NSTEMI) myocardial infarction: Secondary | ICD-10-CM | POA: Diagnosis present

## 2014-12-17 DIAGNOSIS — M109 Gout, unspecified: Secondary | ICD-10-CM | POA: Diagnosis present

## 2014-12-17 DIAGNOSIS — I129 Hypertensive chronic kidney disease with stage 1 through stage 4 chronic kidney disease, or unspecified chronic kidney disease: Secondary | ICD-10-CM | POA: Diagnosis not present

## 2014-12-17 DIAGNOSIS — E8881 Metabolic syndrome: Secondary | ICD-10-CM | POA: Diagnosis present

## 2014-12-17 DIAGNOSIS — Z7982 Long term (current) use of aspirin: Secondary | ICD-10-CM | POA: Diagnosis not present

## 2014-12-17 DIAGNOSIS — E780 Pure hypercholesterolemia: Secondary | ICD-10-CM | POA: Diagnosis not present

## 2014-12-17 DIAGNOSIS — J9811 Atelectasis: Secondary | ICD-10-CM | POA: Diagnosis not present

## 2014-12-17 DIAGNOSIS — IMO0002 Reserved for concepts with insufficient information to code with codable children: Secondary | ICD-10-CM

## 2014-12-17 DIAGNOSIS — I249 Acute ischemic heart disease, unspecified: Secondary | ICD-10-CM

## 2014-12-17 DIAGNOSIS — D62 Acute posthemorrhagic anemia: Secondary | ICD-10-CM | POA: Diagnosis not present

## 2014-12-17 DIAGNOSIS — E669 Obesity, unspecified: Secondary | ICD-10-CM

## 2014-12-17 DIAGNOSIS — I25119 Atherosclerotic heart disease of native coronary artery with unspecified angina pectoris: Secondary | ICD-10-CM

## 2014-12-17 DIAGNOSIS — J45909 Unspecified asthma, uncomplicated: Secondary | ICD-10-CM | POA: Diagnosis present

## 2014-12-17 DIAGNOSIS — I25110 Atherosclerotic heart disease of native coronary artery with unstable angina pectoris: Secondary | ICD-10-CM | POA: Diagnosis not present

## 2014-12-17 DIAGNOSIS — Z6835 Body mass index (BMI) 35.0-35.9, adult: Secondary | ICD-10-CM | POA: Diagnosis not present

## 2014-12-17 DIAGNOSIS — E66812 Obesity, class 2: Secondary | ICD-10-CM

## 2014-12-17 DIAGNOSIS — I200 Unstable angina: Secondary | ICD-10-CM | POA: Diagnosis present

## 2014-12-17 DIAGNOSIS — I10 Essential (primary) hypertension: Secondary | ICD-10-CM

## 2014-12-17 DIAGNOSIS — E11649 Type 2 diabetes mellitus with hypoglycemia without coma: Secondary | ICD-10-CM | POA: Diagnosis present

## 2014-12-17 DIAGNOSIS — E1169 Type 2 diabetes mellitus with other specified complication: Secondary | ICD-10-CM | POA: Diagnosis present

## 2014-12-17 HISTORY — DX: Chronic kidney disease, stage 3 (moderate): N18.3

## 2014-12-17 HISTORY — DX: Unspecified asthma, uncomplicated: J45.909

## 2014-12-17 HISTORY — DX: Chronic kidney disease, stage 3 unspecified: N18.30

## 2014-12-17 LAB — CBC
HEMATOCRIT: 37.5 % (ref 36.0–46.0)
Hemoglobin: 12.5 g/dL (ref 12.0–15.0)
MCH: 31 pg (ref 26.0–34.0)
MCHC: 33.3 g/dL (ref 30.0–36.0)
MCV: 93.1 fL (ref 78.0–100.0)
Platelets: 212 10*3/uL (ref 150–400)
RBC: 4.03 MIL/uL (ref 3.87–5.11)
RDW: 12.6 % (ref 11.5–15.5)
WBC: 11.5 10*3/uL — AB (ref 4.0–10.5)

## 2014-12-17 LAB — BASIC METABOLIC PANEL
ANION GAP: 10 (ref 5–15)
BUN: 18 mg/dL (ref 6–23)
CALCIUM: 9.2 mg/dL (ref 8.4–10.5)
CO2: 21 mmol/L (ref 19–32)
Chloride: 107 mEq/L (ref 96–112)
Creatinine, Ser: 1.49 mg/dL — ABNORMAL HIGH (ref 0.50–1.10)
GFR calc Af Amer: 41 mL/min — ABNORMAL LOW (ref >90)
GFR calc non Af Amer: 36 mL/min — ABNORMAL LOW (ref >90)
Glucose, Bld: 351 mg/dL — ABNORMAL HIGH (ref 70–99)
Potassium: 4.1 mmol/L (ref 3.5–5.1)
Sodium: 138 mmol/L (ref 135–145)

## 2014-12-17 LAB — HEPATIC FUNCTION PANEL
ALT: 27 U/L (ref 0–35)
AST: 29 U/L (ref 0–37)
Albumin: 3.8 g/dL (ref 3.5–5.2)
Alkaline Phosphatase: 93 U/L (ref 39–117)
BILIRUBIN DIRECT: 0.1 mg/dL (ref 0.0–0.3)
BILIRUBIN INDIRECT: 0.2 mg/dL — AB (ref 0.3–0.9)
Total Bilirubin: 0.3 mg/dL (ref 0.3–1.2)
Total Protein: 6.8 g/dL (ref 6.0–8.3)

## 2014-12-17 LAB — I-STAT TROPONIN, ED: TROPONIN I, POC: 0.06 ng/mL (ref 0.00–0.08)

## 2014-12-17 LAB — PROTIME-INR
INR: 1.18 (ref 0.00–1.49)
INR: 1.1 (ref 0.00–1.49)
PROTHROMBIN TIME: 14.3 s (ref 11.6–15.2)
Prothrombin Time: 15.1 seconds (ref 11.6–15.2)

## 2014-12-17 LAB — CBG MONITORING, ED: Glucose-Capillary: 271 mg/dL — ABNORMAL HIGH (ref 70–99)

## 2014-12-17 LAB — TROPONIN I: TROPONIN I: 0.68 ng/mL — AB (ref <0.031)

## 2014-12-17 LAB — TSH: TSH: 2.125 u[IU]/mL (ref 0.350–4.500)

## 2014-12-17 MED ORDER — HEPARIN BOLUS VIA INFUSION
4000.0000 [IU] | Freq: Once | INTRAVENOUS | Status: AC
  Administered 2014-12-17: 4000 [IU] via INTRAVENOUS
  Filled 2014-12-17: qty 4000

## 2014-12-17 MED ORDER — NITROGLYCERIN 0.4 MG SL SUBL
0.4000 mg | SUBLINGUAL_TABLET | SUBLINGUAL | Status: DC | PRN
  Administered 2014-12-18: 0.4 mg via SUBLINGUAL
  Filled 2014-12-17: qty 1

## 2014-12-17 MED ORDER — ATORVASTATIN CALCIUM 80 MG PO TABS
80.0000 mg | ORAL_TABLET | Freq: Every day | ORAL | Status: DC
  Administered 2014-12-17 – 2014-12-26 (×9): 80 mg via ORAL
  Filled 2014-12-17 (×12): qty 1

## 2014-12-17 MED ORDER — ONDANSETRON HCL 4 MG/2ML IJ SOLN: 4.0000 mg | Freq: Four times a day (QID) | INTRAMUSCULAR | Status: DC | PRN

## 2014-12-17 MED ORDER — SODIUM CHLORIDE 0.9 % IV SOLN
INTRAVENOUS | Status: DC
  Administered 2014-12-17: 17:00:00 via INTRAVENOUS

## 2014-12-17 MED ORDER — INSULIN ASPART 100 UNIT/ML ~~LOC~~ SOLN
0.0000 [IU] | Freq: Three times a day (TID) | SUBCUTANEOUS | Status: DC
Start: 2014-12-18 — End: 2014-12-18
  Administered 2014-12-18 (×2): 3 [IU] via SUBCUTANEOUS
  Filled 2014-12-17: qty 1

## 2014-12-17 MED ORDER — ALBUTEROL SULFATE HFA 108 (90 BASE) MCG/ACT IN AERS: 2.0000 | INHALATION_SPRAY | Freq: Four times a day (QID) | RESPIRATORY_TRACT | Status: DC | PRN

## 2014-12-17 MED ORDER — HEPARIN (PORCINE) IN NACL 100-0.45 UNIT/ML-% IJ SOLN
900.0000 [IU]/h | INTRAMUSCULAR | Status: DC
  Administered 2014-12-17: 900 [IU]/h via INTRAVENOUS
  Filled 2014-12-17: qty 250

## 2014-12-17 MED ORDER — LINAGLIPTIN 5 MG PO TABS
5.0000 mg | ORAL_TABLET | Freq: Every day | ORAL | Status: DC
  Administered 2014-12-19 – 2014-12-20 (×2): 5 mg via ORAL
  Filled 2014-12-17 (×3): qty 1

## 2014-12-17 MED ORDER — NITROGLYCERIN IN D5W 200-5 MCG/ML-% IV SOLN
0.0000 ug/min | INTRAVENOUS | Status: DC
  Administered 2014-12-17: 10 ug/min via INTRAVENOUS
  Administered 2014-12-18 (×2): 3 ug/min via INTRAVENOUS
  Filled 2014-12-17 (×2): qty 250

## 2014-12-17 MED ORDER — METOPROLOL TARTRATE 1 MG/ML IV SOLN
5.0000 mg | Freq: Once | INTRAVENOUS | Status: AC
  Administered 2014-12-17: 5 mg via INTRAVENOUS
  Filled 2014-12-17: qty 5

## 2014-12-17 MED ORDER — GLIPIZIDE 10 MG PO TABS
10.0000 mg | ORAL_TABLET | Freq: Two times a day (BID) | ORAL | Status: DC
  Administered 2014-12-18 – 2014-12-20 (×5): 10 mg via ORAL
  Filled 2014-12-17 (×10): qty 1

## 2014-12-17 MED ORDER — ASPIRIN 81 MG PO CHEW
81.0000 mg | CHEWABLE_TABLET | ORAL | Status: AC
  Administered 2014-12-18: 81 mg via ORAL
  Filled 2014-12-17: qty 1

## 2014-12-17 MED ORDER — MOMETASONE FURO-FORMOTEROL FUM 100-5 MCG/ACT IN AERO: 2.0000 | INHALATION_SPRAY | Freq: Every day | RESPIRATORY_TRACT | Status: DC | PRN

## 2014-12-17 MED ORDER — NITROGLYCERIN 2 % TD OINT
1.0000 [in_us] | TOPICAL_OINTMENT | Freq: Once | TRANSDERMAL | Status: AC
  Administered 2014-12-17: 1 [in_us] via TOPICAL
  Filled 2014-12-17: qty 1

## 2014-12-17 MED ORDER — METOPROLOL TARTRATE 12.5 MG HALF TABLET
12.5000 mg | ORAL_TABLET | Freq: Two times a day (BID) | ORAL | Status: DC
  Administered 2014-12-18 – 2014-12-19 (×4): 12.5 mg via ORAL
  Filled 2014-12-17 (×5): qty 1

## 2014-12-17 MED ORDER — ACETAMINOPHEN 325 MG PO TABS: 650.0000 mg | ORAL_TABLET | ORAL | Status: DC | PRN

## 2014-12-17 MED ORDER — ASPIRIN EC 81 MG PO TBEC
81.0000 mg | DELAYED_RELEASE_TABLET | Freq: Every day | ORAL | Status: DC
  Administered 2014-12-19 – 2014-12-20 (×2): 81 mg via ORAL
  Filled 2014-12-17 (×3): qty 1

## 2014-12-17 NOTE — ED Provider Notes (Signed)
CSN: [id number]     Arrival date & time 12/17/14  1301 History   First MD Initiated Contact with Patient 12/17/14 1303     Chief Complaint  Patient presents with  . Chest Pain     (Consider location/radiation/quality/duration/timing/severity/associated sxs/prior Treatment) Patient is a 64 y.o. female presenting with chest pain. The history is provided by the patient.  Chest Pain Pain location:  Substernal area and L chest Pain quality: pressure   Pain radiates to:  R shoulder Pain radiates to the back: no   Pain severity:  Moderate Onset quality:  Gradual Timing:  Intermittent Progression:  Worsening Chronicity:  Recurrent Context: at rest   Context comment:  Walking Relieved by:  Nitroglycerin and aspirin Worsened by:  Exertion Associated symptoms: nausea and shortness of breath   Associated symptoms: no cough and no fever     Past Medical History  Diagnosis Date  . Diabetes mellitus   . Hypertension   . Hypercholesterolemia   . Seasonal allergies    Past Surgical History  Procedure Laterality Date  . Abdominal hysterectomy     History reviewed. No pertinent family history. History  Substance Use Topics  . Smoking status: Never Smoker   . Smokeless tobacco: Never Used  . Alcohol Use: Yes     Comment: Rarely    OB History    Gravida Para Term Preterm AB TAB SAB Ectopic Multiple Living   Review of Systems  Constitutional: Negative for fever.  Respiratory: Positive for shortness of breath. Negative for cough.   Cardiovascular: Positive for chest pain.  Gastrointestinal: Positive for nausea.  All other systems reviewed and are negative.     Allergies  Ace inhibitors; Lisinopril; Mushroom extract complex; and Tetanus toxoids  Home Medications   Prior to Admission medications   Medication Sig Start Date End Date Taking? Authorizing Provider  albuterol (PROVENTIL HFA;VENTOLIN HFA) 108 (90 BASE) MCG/ACT inhaler Inhale 2 puffs into the  lungs every 6 (six) hours as needed. For shortness of breath or wheezing    Historical Provider, MD  aspirin 81 MG tablet Take 81 mg by mouth daily.    Historical Provider, MD  colchicine 0.6 MG tablet Take 0.6 mg by mouth 2 (two) times daily. As needed.    Historical Provider, MD  Fluticasone-Salmeterol (ADVAIR) 250-50 MCG/DOSE AEPB Inhale 1 puff into the lungs daily as needed (for shortness of breath).    Historical Provider, MD  glipiZIDE (GLUCOTROL) 10 MG tablet Take 10 mg by mouth 2 (two) times daily before a meal.    Historical Provider, MD  hydrochlorothiazide (HYDRODIURIL) 25 MG tablet Take 25 mg by mouth daily.    Historical Provider, MD  indapamide (LOZOL) 1.25 MG tablet Take 1.25 mg by mouth daily.    Historical Provider, MD  ipratropium (ATROVENT) 0.06 % nasal spray Place 2 sprays into both nostrils 4 (four) times daily. 06/13/14   Mathis Fare Presson, PA  linagliptin (TRADJENTA) 5 MG TABS tablet Take 5 mg by mouth daily.    Historical Provider, MD  meloxicam (MOBIC) 7.5 MG tablet Take 7.5 mg by mouth daily.    Historical Provider, MD  metFORMIN (GLUCOPHAGE) 500 MG tablet Take by mouth 2 (two) times daily with a meal.    Historical Provider, MD  Multiple Vitamin (MULTIVITAMIN WITH MINERALS) TABS Take 1 tablet by mouth daily.    Historical Provider, MD  pseudoephedrine-guaifenesin [facility] D) 60-600 MG  per tablet Take 1 tablet by mouth daily as needed for congestion.    Historical Provider, MD  simvastatin (ZOCOR) 10 MG tablet Take 10 mg by mouth at bedtime.    Historical Provider, MD   BP 105/79 mmHg  Pulse 76  Resp 20  SpO2 99% Physical Exam  Constitutional: She is oriented to person, place, and time. She appears well-developed and well-nourished. No distress.  HENT:  Head: Normocephalic and atraumatic.  Mouth/Throat: Oropharynx is clear and moist.  Eyes: EOM are normal. Pupils are equal, round, and reactive to light.  Neck: Normal range of motion. Neck supple.   Cardiovascular: Normal rate and regular rhythm.  Exam reveals no friction rub.   No murmur heard. Pulmonary/Chest: Effort normal and breath sounds normal. No respiratory distress. She has no wheezes. She has no rales.  Abdominal: Soft. She exhibits no distension. There is no tenderness. There is no rebound.  Musculoskeletal: Normal range of motion. She exhibits no edema.  Neurological: She is alert and oriented to person, place, and time.  Skin: No rash noted. She is not diaphoretic.  Nursing note and vitals reviewed.   ED Course  Procedures (including critical care time) Labs Review Labs Reviewed  CBC  BASIC METABOLIC PANEL  I-STAT TROPOININ, ED    Imaging Review Dg Chest Portable 1 View  12/17/2014   CLINICAL DATA:  Right chest pain.  EXAM: PORTABLE CHEST - 1 VIEW  COMPARISON:  PA and lateral chest 05/22/2014.  FINDINGS: Heart size and mediastinal contours are within normal limits. Both lungs are clear. Visualized skeletal structures are unremarkable.  IMPRESSION: Negative exam.   Electronically Signed   By: Drusilla Kanner M.D.   On: 12/17/2014 13:46     EKG Interpretation   Date/Time:  Monday December 17 2014 13:15:16 EST Ventricular Rate:  76 PR Interval:  163 QRS Duration: 78 QT Interval:  438 QTC Calculation: 492 R Axis:   -36 Text Interpretation:  Sinus rhythm Left axis deviation Low voltage,  precordial leads Consider anterior infarct Abnrm T, consider ischemia,  anterolateral lds T wave inversion/flattening laterally T wave flattening  anteriorly Confirmed by Gwendolyn Grant  MD, Deveion Denz ([id number]) on 12/17/2014 1:44:24 PM      MDM   Final diagnoses:  Chest pain    77F presents with unstable angina. Has had exertional chest pain for past couple of months, today it did not go away after she sat down. States this was the worst she ever had. Relieved with NTG from EMS. No hx of a heart cath.  EKG with some ischemic T wave changes, no ST elevation. CP free at this time, will put  on NTG paste. Plan for Cardiology consult.   Elwin Mocha, MD 12/17/14 [phone]

## 2014-12-17 NOTE — ED Notes (Signed)
Ordered meal tray for pt 

## 2014-12-17 NOTE — Progress Notes (Addendum)
With regard to DM meds, I wrote to hold Trajenta in AM in prep for cath, and to hold glipizide any time patient is NPO. Metformin held. SSI ordered.  Patient is now pain free with the aforementioned measures. Will allow her to eat. She is on the cath board for tomorrow. I asked her to please notify her nurse promptly if pain returns. Dayna Dunn PA-C

## 2014-12-17 NOTE — H&P (Signed)
History and Physical  Patient ID: Undine Nealis MRN: [id number], DOB: [birth year] Date of Encounter: 12/17/2014, 4:33 PM Primary Physician: Dorrene German, MD Primary Cardiologist: new to [facility]  Chief Complaint: chest pain Reason for Admission: unstable angina  HPI: Ms. Esselman is a 64 y/o F with history of HTN, DM (10-15 years), HLD, seasonal allergies, asthma, CKD stage III by labs, and no prior cardiac history who presented to [facility] today with chest pain. For the last 2 months she has had intermittent chest discomfort. Previously it would exclusively occur when she would exert herself such as walking to her car. This would always go away with rest. It has been getting worse recently. Today while driving she noticed an episode of chest pain coming on at rest which was unusual. It radiated to her right arm and she vomited once. Despite resting or trying to remain inactive the pain persisted prompting her to seek care in the ED. She received  ASA prior to arrival. She was given 2 SL with relief of her pain. It has been on/off since that time. She denies fever, chills, cough, syncope, palpitations, LEE, orthopnea, BRBPR, hematemesis, hematuria and melena. She has gained about 10lbs in 2 months gradually which she feels is due to the holidays. No h/o bleeding problems. No h/o TIA or CVA.  Workup in ED reveals Cr 1.49, glu 351 (which patient states is unusual), WBC 11.5, troponin neg x 1. CXR negative. EKG abnormal with dynamic anterior TW changes in V2 as well as I, avL.  Past Medical History  Diagnosis Date  . Diabetes mellitus   . Hypertension   . Hypercholesterolemia   . Seasonal allergies   . CKD (chronic kidney disease), stage III   . Asthma      Most Recent Cardiac Studies: None    Surgical History:  Past Surgical History  Procedure Laterality Date  . Abdominal hysterectomy    . Tonsillectomy       Home Meds: Prior to Admission medications     Medication Sig Start Date End Date Taking? Authorizing Provider  albuterol (PROVENTIL HFA;VENTOLIN HFA) 108 (90 BASE) MCG/ACT inhaler Inhale 2 puffs into the lungs every 6 (six) hours as needed. For shortness of breath or wheezing   Yes Historical Provider, MD  aspirin 81 MG tablet Take 81 mg by mouth daily.   Yes Historical Provider, MD  colchicine 0.6 MG tablet Take 0.6 mg by mouth 2 (two) times daily. As needed.   Yes Historical Provider, MD  Fluticasone-Salmeterol (ADVAIR) 250-50 MCG/DOSE AEPB Inhale 1 puff into the lungs daily as needed (for shortness of breath).   Yes Historical Provider, MD  glipiZIDE (GLUCOTROL) 10 MG tablet Take 10 mg by mouth 2 (two) times daily before a meal.   Yes Historical Provider, MD  hydrochlorothiazide (HYDRODIURIL) 25 MG tablet Take 25 mg by mouth daily.   Yes Historical Provider, MD  linagliptin (TRADJENTA) 5 MG TABS tablet Take 5 mg by mouth daily.   Yes Historical Provider, MD  metFORMIN (GLUCOPHAGE) 500 MG tablet Take by mouth 2 (two) times daily with a meal.   Yes Historical Provider, MD  simvastatin (ZOCOR) 10 MG tablet Take 10 mg by mouth at bedtime.   Yes Historical Provider, MD  ipratropium (ATROVENT) 0.06 % nasal spray Place 2 sprays into both nostrils 4 (four) times daily. Patient not taking: Reported on 12/17/2014 06/13/14   Ria Clock, PA    Allergies:  Allergies  Allergen Reactions  .  Ace Inhibitors Anaphylaxis  . Lisinopril Anaphylaxis  . Mushroom Extract Complex Swelling  . Tetanus Toxoids Nausea And Vomiting and Swelling    History   Social History  . Marital Status: Single    Spouse Name: N/A    Number of Children: N/A  . Years of Education: N/A   Occupational History  .      Retired Midwife   Social History Main Topics  . Smoking status: Never Smoker   . Smokeless tobacco: Never Used  . Alcohol Use: No  . Drug Use: No  . Sexual Activity: Not Currently    Birth Control/ Protection: Surgical   Other Topics  Concern  . Not on file   Social History Narrative     Family History  Problem Relation Age of Onset  . Pancreatic cancer Mother   . Stroke Father   . CAD Mother     Open heart surgery 60s-70s  . Heart disease Sister     Unclear details    Review of Systems: General: negative for chills, fever. Occasional night sweats --> no relationship to chest pain (advised patient to f/u PCP regarding this) Cardiovascular: see above Dermatological: negative for rash Respiratory: negative for cough or wheezing Urologic: negative for hematuria Abdominal: negative for diarrhea, bright red blood per rectum, melena, or hematemesis Neurologic: negative for visual changes, syncope, or dizziness All other systems reviewed and are otherwise negative except as noted above.  Labs:   Lab Results  Component Value Date   WBC 11.5* 12/17/2014   HGB 12.5 12/17/2014   HCT 37.5 12/17/2014   MCV 93.1 12/17/2014   PLT 212 12/17/2014     Recent Labs Lab 12/17/14 1413  NA 138  K 4.1  CL 107  CO2 21  BUN 18  CREATININE 1.49*  CALCIUM 9.2  GLUCOSE 351*    Lab Results  Component Value Date   CHOL 105 01/29/2014   HDL 35* 01/29/2014   LDLCALC 53 01/29/2014   TRIG 85 01/29/2014   Radiology/Studies:  Dg Chest Portable 1 View  12/17/2014   CLINICAL DATA:  Right chest pain.  EXAM: PORTABLE CHEST - 1 VIEW  COMPARISON:  PA and lateral chest 05/22/2014.  FINDINGS: Heart size and mediastinal contours are within normal limits. Both lungs are clear. Visualized skeletal structures are unremarkable.  IMPRESSION: Negative exam.   Electronically Signed   By: Drusilla Kanner M.D.   On: 12/17/2014 13:46    EKG: initial: NSR 76bpm, left axis deviation, abnormal ST-T segment in V2 and I, TWI avL These changes appear pseudonormalized on f/u tracing  Physical Exam: Blood pressure 140/88, pulse 79, resp. rate 33, SpO2 98 %. General: Well developed, well nourished AAF, in no acute distress. Head: Normocephalic,  atraumatic, sclera non-icteric, no xanthomas, nares are without discharge.  Neck: Negative for carotid bruits. JVD not elevated. Mild thyroid fullness. Lungs: Clear bilaterally to auscultation without wheezes, rales, or rhonchi. Breathing is unlabored. Heart: RRR with S1 S2. No murmurs, rubs, or gallops appreciated. Abdomen: Soft, non-tender, non-distended with normoactive bowel sounds. No hepatomegaly. No rebound/guarding. No obvious abdominal masses. Msk:  Strength and tone appear normal for age. Extremities: No clubbing or cyanosis. No edema.  Distal pedal pulses are 2+ and equal bilaterally. Neuro: Alert and oriented X 3. No focal deficit. No facial asymmetry. Moves all extremities spontaneously. Psych:  Responds to questions appropriately with a normal affect.    ASSESSMENT AND PLAN:  1. Chest pain concerning for [place] - story and  EKG concerning for angina. Will attempt to get pain free in the ED with addition of IV Lopressor, IV heparin, and changing NTG paste to NTG gtt. Tentatively plan cath tomorrow but if unable to cool her off this evening we may need to take her to the lab tonight. Risks and benefits of cardiac catheterization have been discussed with the patient. These include bleeding, infection, kidney damage, stroke, heart attack, death. The patient understands these risks and is willing to proceed. Given renal insufficiency we have started IV fluids. Would avoid LV gram to conserve contrast - we can obtain a 2D echo for LV function assessment. 2. Diabetes mellitus with elevated CBG - hold Metformin, continue other home regimen and add SSI. Check A1C. 3. CKD stage III - start IV hydration at 100cc/hr. Hold HCTZ. She is not on ACEI due to h/o anaphylaxis. 4. HTN - follow BP with current regimen. 5. Hyperlipidemia - check LFTs, Escalate statin to  qpm. 6. ?Thyroid fullness - she notes mild glandular tenderness as well. Check thyroid function. She will need to f/u PCP for further  evaluation. 7. H/o asthma - no wheezing. Continue current regimen.  Signed, Dayna Dunn PA-C 12/17/2014, 4:33 PM  As above, patient seen and examined. Briefly she is a 64 year old female with a past medical history of diabetes mellitus, hypertension, hyperlipidemia, renal insufficiency for evaluation of unstable angina. Over the past 2 months the patient has developed substernal chest pressure radiating to her right upper extremity with exertion relieved with rest. The pain increased today after ambulating to a van. It also persisted and she was brought to the emergency room. She presently is experiencing 4/10 chest pain. Initial electrocardiogram showed sinus rhythm with T-wave inversion in V1 and V3 as well as lateral leads. Initial troponin 0.06. Creatinine 1.49. Symptoms are classic for unstable angina. Plan to admit to CCU. Cycle enzymes. Treat with aspirin, heparin, nitroglycerin and metoprolol. Change statin to Lipitor 80 mg daily. She will require cardiac catheterization. The risks and benefits including myocardial infarction, death and CVA were discussed and she agrees to proceed. I also explained the risk of contrast nephropathy given baseline renal insufficiency and diabetes mellitus. I would like to hydrate prior to her procedure. However if we cannot make pain-free this evening with above medications we will proceed urgently. Hold Glucophage for 48 hours following procedure. No ventriculogram but instead we will assess LV function with echo. Olga Millers

## 2014-12-17 NOTE — ED Notes (Signed)
Cardiologist at bedside.  

## 2014-12-17 NOTE — ED Notes (Addendum)
Per EMS, pt comes from home with c/o right chest pain radiating to right shoulder. Pt notes heaviness and sharp pain when ambulating. At rest pt has no pain. Pt had vomitted x1. Pt A&OX4, NAD noted. Pt given  ASA and 2 nitro were given en route. Chest pain was relieved. VSS, sinus tachycardia 12 lead. 20G IV in left AC.

## 2014-12-17 NOTE — ED Notes (Signed)
Radiology at bedside

## 2014-12-17 NOTE — Progress Notes (Signed)
ANTICOAGULATION CONSULT NOTE - Initial Consult  Pharmacy Consult for heparin gtt Indication: chest pain/ACS  Allergies  Allergen Reactions  . Ace Inhibitors Anaphylaxis  . Lisinopril Anaphylaxis  . Mushroom Extract Complex Swelling  . Tetanus Toxoids Nausea And Vomiting and Swelling    Patient Measurements:    IBW: 50.1 kg Heparin Dosing Weight: 71.5 kg  Vital Signs: Temp Source: Oral (01/04 1313) BP: 131/88 mmHg (01/04 1645) Pulse Rate: 68 (01/04 1645)  Labs:  Recent Labs  12/17/14 1413  HGB 12.5  HCT 37.5  PLT 212  CREATININE 1.49*    CrCl cannot be calculated (Unknown ideal weight.).   Medical History: Past Medical History  Diagnosis Date  . Diabetes mellitus   . Hypertension   . Hypercholesterolemia   . Seasonal allergies   . CKD (chronic kidney disease), stage III   . Asthma     Medications:   (Not in a hospital admission)  Assessment: 64 yo female admitted with chest pain, complains of intermittent chest pain for the past 2 months. Initial troponin negative, initiating heparin gtt for rule out ACS. Anticipate cath tomorrow. Pt is not on any anticoagulants PTA, h/h 12.5/37.5, plts wnl, INR 1.1.   Goal of Therapy:  Heparin level 0.3-0.7 units/ml Monitor platelets by anticoagulation protocol: Yes   Plan:  Heparin bolus 4000 units x1 Heparin 900 units/hr Daily HL, CBC Check HL this evening Monitor for s/sx bleeding   Agapito Games, PharmD, BCPS Clinical Pharmacist Pager: [phone] 12/17/2014 9:27 PM

## 2014-12-17 NOTE — ED Notes (Signed)
Pt began to have chest pain 3/10, nitro drip restarted. EKG done, cardiology paged.

## 2014-12-18 ENCOUNTER — Other Ambulatory Visit: Payer: Self-pay | Admitting: *Deleted

## 2014-12-18 ENCOUNTER — Encounter (HOSPITAL_COMMUNITY)
Admission: EM | Disposition: A | Discharge status: Home / Self Care | Payer: Self-pay | Source: Home / Self Care | Attending: Thoracic Surgery (Cardiothoracic Vascular Surgery)

## 2014-12-18 DIAGNOSIS — I2510 Atherosclerotic heart disease of native coronary artery without angina pectoris: Secondary | ICD-10-CM

## 2014-12-18 DIAGNOSIS — E669 Obesity, unspecified: Secondary | ICD-10-CM | POA: Diagnosis present

## 2014-12-18 DIAGNOSIS — E8881 Metabolic syndrome: Secondary | ICD-10-CM

## 2014-12-18 DIAGNOSIS — E1165 Type 2 diabetes mellitus with hyperglycemia: Secondary | ICD-10-CM

## 2014-12-18 DIAGNOSIS — I214 Non-ST elevation (NSTEMI) myocardial infarction: Secondary | ICD-10-CM

## 2014-12-18 DIAGNOSIS — E785 Hyperlipidemia, unspecified: Secondary | ICD-10-CM

## 2014-12-18 DIAGNOSIS — E66812 Obesity, class 2: Secondary | ICD-10-CM | POA: Diagnosis present

## 2014-12-18 DIAGNOSIS — J4520 Mild intermittent asthma, uncomplicated: Secondary | ICD-10-CM

## 2014-12-18 DIAGNOSIS — Z01810 Encounter for preprocedural cardiovascular examination: Secondary | ICD-10-CM

## 2014-12-18 DIAGNOSIS — R072 Precordial pain: Secondary | ICD-10-CM

## 2014-12-18 HISTORY — PX: LEFT HEART CATHETERIZATION WITH CORONARY ANGIOGRAM: SHX5451

## 2014-12-18 HISTORY — DX: Metabolic syndrome: E88.81

## 2014-12-18 HISTORY — DX: Non-ST elevation (NSTEMI) myocardial infarction: I21.4

## 2014-12-18 HISTORY — DX: Metabolic syndrome: E88.810

## 2014-12-18 LAB — CBC
HEMATOCRIT: 34.5 % — AB (ref 36.0–46.0)
Hemoglobin: 11.5 g/dL — ABNORMAL LOW (ref 12.0–15.0)
MCH: 30.9 pg (ref 26.0–34.0)
MCHC: 33.3 g/dL (ref 30.0–36.0)
MCV: 92.7 fL (ref 78.0–100.0)
Platelets: 228 10*3/uL (ref 150–400)
RBC: 3.72 MIL/uL — AB (ref 3.87–5.11)
RDW: 12.8 % (ref 11.5–15.5)
WBC: 13.4 10*3/uL — AB (ref 4.0–10.5)

## 2014-12-18 LAB — LIPID PANEL
CHOL/HDL RATIO: 3 ratio
CHOLESTEROL: 110 mg/dL (ref 0–200)
HDL: 37 mg/dL — ABNORMAL LOW (ref >39)
LDL Cholesterol: 62 mg/dL (ref 0–99)
Triglycerides: 56 mg/dL (ref <150)
VLDL: 11 mg/dL (ref 0–40)

## 2014-12-18 LAB — GLUCOSE, CAPILLARY
GLUCOSE-CAPILLARY: 194 mg/dL — AB (ref 70–99)
GLUCOSE-CAPILLARY: 202 mg/dL — AB (ref 70–99)
Glucose-Capillary: 207 mg/dL — ABNORMAL HIGH (ref 70–99)
Glucose-Capillary: 176 mg/dL — ABNORMAL HIGH (ref 70–99)

## 2014-12-18 LAB — HEMOGLOBIN A1C
Hgb A1c MFr Bld: 9 % — ABNORMAL HIGH (ref <5.7)
MEAN PLASMA GLUCOSE: 212 mg/dL — AB (ref <117)

## 2014-12-18 LAB — BASIC METABOLIC PANEL
ANION GAP: 9 (ref 5–15)
BUN: 17 mg/dL (ref 6–23)
CHLORIDE: 108 meq/L (ref 96–112)
CO2: 23 mmol/L (ref 19–32)
Calcium: 8.9 mg/dL (ref 8.4–10.5)
Creatinine, Ser: 1.26 mg/dL — ABNORMAL HIGH (ref 0.50–1.10)
GFR calc non Af Amer: 44 mL/min — ABNORMAL LOW (ref >90)
GFR, EST AFRICAN AMERICAN: 51 mL/min — AB (ref >90)
Glucose, Bld: 211 mg/dL — ABNORMAL HIGH (ref 70–99)
Potassium: 3.7 mmol/L (ref 3.5–5.1)
Sodium: 140 mmol/L (ref 135–145)

## 2014-12-18 LAB — TROPONIN I
Troponin I: 1.62 ng/mL (ref <0.031)
Troponin I: 1.59 ng/mL (ref <0.031)

## 2014-12-18 LAB — HEPARIN LEVEL (UNFRACTIONATED)
HEPARIN UNFRACTIONATED: 0.37 [IU]/mL (ref 0.30–0.70)
Heparin Unfractionated: 0.35 IU/mL (ref 0.30–0.70)

## 2014-12-18 LAB — T4, FREE: Free T4: 0.95 ng/dL (ref 0.80–1.80)

## 2014-12-18 LAB — CBG MONITORING, ED: GLUCOSE-CAPILLARY: 237 mg/dL — AB (ref 70–99)

## 2014-12-18 LAB — MRSA PCR SCREENING: MRSA by PCR: NEGATIVE

## 2014-12-18 SURGERY — LEFT HEART CATHETERIZATION WITH CORONARY ANGIOGRAM

## 2014-12-18 MED ORDER — PNEUMOCOCCAL VAC POLYVALENT 25 MCG/0.5ML IJ INJ
0.5000 mL | INJECTION | INTRAMUSCULAR | Status: AC
  Administered 2014-12-19: 0.5 mL via INTRAMUSCULAR
  Filled 2014-12-18: qty 0.5

## 2014-12-18 MED ORDER — INFLUENZA VAC SPLIT QUAD 0.5 ML IM SUSY
0.5000 mL | PREFILLED_SYRINGE | INTRAMUSCULAR | Status: AC
  Administered 2014-12-19: 0.5 mL via INTRAMUSCULAR
  Filled 2014-12-18: qty 0.5

## 2014-12-18 MED ORDER — ALBUTEROL SULFATE (2.5 MG/3ML) 0.083% IN NEBU
2.5000 mg | INHALATION_SOLUTION | Freq: Four times a day (QID) | RESPIRATORY_TRACT | Status: DC | PRN
  Administered 2014-12-23: 2.5 mg via RESPIRATORY_TRACT
  Filled 2014-12-18: qty 3

## 2014-12-18 MED ORDER — LIDOCAINE HCL (PF) 1 % IJ SOLN
INTRAMUSCULAR | Status: AC
  Filled 2014-12-18: qty 30

## 2014-12-18 MED ORDER — NITROGLYCERIN 1 MG/10 ML FOR IR/CATH LAB
INTRA_ARTERIAL | Status: AC
  Filled 2014-12-18: qty 10

## 2014-12-18 MED ORDER — SODIUM CHLORIDE 0.9 % IV SOLN
1.0000 mL/kg/h | INTRAVENOUS | Status: AC
  Administered 2014-12-18 (×2): 1 mL/kg/h via INTRAVENOUS

## 2014-12-18 MED ORDER — HEPARIN SODIUM (PORCINE) 1000 UNIT/ML IJ SOLN
INTRAMUSCULAR | Status: AC
  Filled 2014-12-18: qty 1

## 2014-12-18 MED ORDER — MIDAZOLAM HCL 2 MG/2ML IJ SOLN
INTRAMUSCULAR | Status: AC
  Filled 2014-12-18: qty 2

## 2014-12-18 MED ORDER — FENTANYL CITRATE 0.05 MG/ML IJ SOLN
INTRAMUSCULAR | Status: AC
  Filled 2014-12-18: qty 2

## 2014-12-18 MED ORDER — HEPARIN (PORCINE) IN NACL 100-0.45 UNIT/ML-% IJ SOLN
1100.0000 [IU]/h | INTRAMUSCULAR | Status: DC
  Administered 2014-12-18: 900 [IU]/h via INTRAVENOUS
  Administered 2014-12-19 – 2014-12-20 (×2): 1100 [IU]/h via INTRAVENOUS
  Filled 2014-12-18 (×5): qty 250

## 2014-12-18 MED ORDER — VERAPAMIL HCL 2.5 MG/ML IV SOLN
INTRAVENOUS | Status: AC
  Filled 2014-12-18: qty 2

## 2014-12-18 MED ORDER — HEPARIN (PORCINE) IN NACL 2-0.9 UNIT/ML-% IJ SOLN
INTRAMUSCULAR | Status: AC
  Filled 2014-12-18: qty 1500

## 2014-12-18 MED ORDER — INSULIN ASPART 100 UNIT/ML ~~LOC~~ SOLN
0.0000 [IU] | Freq: Three times a day (TID) | SUBCUTANEOUS | Status: DC
  Administered 2014-12-18 – 2014-12-19 (×5): 4 [IU] via SUBCUTANEOUS
  Administered 2014-12-20: 7 [IU] via SUBCUTANEOUS
  Administered 2014-12-20: 4 [IU] via SUBCUTANEOUS
  Administered 2014-12-20: 11 [IU] via SUBCUTANEOUS

## 2014-12-18 NOTE — Progress Notes (Signed)
TR band to right radial removed and noted to start bleeding in minimal amount controlled with manual pressure for 15 min. Pressure dressing applied. Continue to monitor.

## 2014-12-18 NOTE — Progress Notes (Signed)
  Echocardiogram 2D Echocardiogram has been performed.  Cheryl Gill FRANCES 12/18/2014, 3:58 PM

## 2014-12-18 NOTE — ED Notes (Signed)
Pt ambulatory to restroom with steady gait; denies chest pain or shortness of breath with exertion

## 2014-12-18 NOTE — Progress Notes (Addendum)
ANTICOAGULATION CONSULT NOTE - Follow Up Consult  Pharmacy Consult for Heparin Indication: NSTEMI  Allergies  Allergen Reactions  . Ace Inhibitors Anaphylaxis  . Lisinopril Anaphylaxis  . Mushroom Extract Complex Swelling  . Tetanus Toxoids Nausea And Vomiting and Swelling    Patient Measurements: Height: 5\' 2"  (157.5 cm) Weight: 195 lb 12.3 oz (88.8 kg) IBW/kg (Calculated) : 50.1 Heparin Dosing Weight: 70kg Vital Signs: Temp: 98.4 F (36.9 C) (01/05 1200) Temp Source: Oral (01/05 1200) BP: 121/85 mmHg (01/05 1200) Pulse Rate: 74 (01/05 1200)  Labs:  Recent Labs  12/17/14 1413 12/17/14 1808 12/17/14 2121 12/18/14 0120 12/18/14 0340 12/18/14 0743  HGB 12.5  --   --   --  11.5*  --   HCT 37.5  --   --   --  34.5*  --   PLT 212  --   --   --  228  --   LABPROT  --  15.1 14.3  --   --   --   INR  --  1.18 1.10  --   --   --   HEPARINUNFRC  --   --   --  0.37  --  0.35  CREATININE 1.49*  --   --   --  1.26*  --   TROPONINI  --   --  0.68* 1.62*  --  1.59*    Estimated Creatinine Clearance: 46.1 mL/min (by C-G formula based on Cr of 1.26).   Medications:  Heparin @ 900 units/hr  Assessment: 63yof continues on heparin for NSTEMI with plans for cath today. Confirmatory heparin level is therapeutic. CBC is stable. No bleeding reported.  Goal of Therapy:  Heparin level 0.3-0.7 units/ml Monitor platelets by anticoagulation protocol: Yes   Plan:  1) Continue heparin at 900 units/hr 2) Follow up after cath  Fredrik RiggerMarkle, Darral Rishel Sue 12/18/2014,1:49 PM  Addendum: Patient is now s/p cath. 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 She will need CABG. Heparin to resume 8 hours post-sheath removal. Radial sheath removed at 14:26 and TR band applied.  Plan: 1) At ~2230 tonight, begin heparin at 900 units/hr, no bolus 2) Follow up daily heparin level, CBC 3) Follow up plans for CABG  Fredrik RiggerMarkle, Rosemond Lyttle Sue 12/18/2014, 3:13 PM

## 2014-12-18 NOTE — Interval H&P Note (Signed)
History and Physical Interval Note:  12/18/2014 1:53 PM  Cheryl Gill  has presented today for surgery, with the diagnosis of CP  The various methods of treatment have been discussed with the patient and family. After consideration of risks, benefits and other options for treatment, the patient has consented to  Procedure(s): LEFT HEART CATHETERIZATION WITH CORONARY ANGIOGRAM (N/A) as a surgical intervention .  The patient's history has been reviewed, patient examined, no change in status, stable for surgery.  I have reviewed the patient's chart and labs.  Questions were answered to the patient's satisfaction.   Cath Lab Visit (complete for each Cath Lab visit)  Clinical Evaluation Leading to the Procedure:   ACS: Yes.    Non-ACS:    Anginal Classification: CCS IV  Anti-ischemic medical therapy: Minimal Therapy (1 class of medications)  Non-Invasive Test Results: No non-invasive testing performed  Prior CABG: No previous CABG        Theron Aristaeter Canyon Surgery CenterJordanMD,FACC 12/18/2014 1:53 PM

## 2014-12-18 NOTE — CV Procedure (Signed)
    Cardiac Catheterization Procedure Note  Name: Cheryl HasteGlenda Gill MRN: [id number] DOB: 04/14/1951  Procedure: Left Heart Cath, Selective Coronary Angiography, LV angiography  Indication: 64 yo BF with multiple cardiac risk factors presents with an NSTEMI.   Procedural Details: The right wrist was prepped, draped, and anesthetized with 1% lidocaine. Using the modified Seldinger technique, a 6 French slender sheath was introduced into the right radial artery. 3 mg of verapamil was administered through the sheath, weight-based unfractionated heparin was administered intravenously. Standard Judkins catheters were used for selective coronary angiography and left ventriculography. Catheter exchanges were performed over an exchange length guidewire. There were no immediate procedural complications. A TR band was used for radial hemostasis at the completion of the procedure.  The patient was transferred to the post catheterization recovery area for further monitoring.  Procedural Findings: Hemodynamics: AO 115/67 mean 85 mm Hg LV 117/15 mm Hg  Coronary angiography: Coronary dominance: right  Left mainstem: Normal.  Left anterior descending (LAD):  There is a 90-95% ostial LAD stenosis. The mid LAD has sequential 80-90% and 90-95% stenoses. The first diagonal is a moderately large branch without significant disease.  Left circumflex (LCx): The LCx is a single large OM and is normal.   Right coronary artery (RCA): Moderately calcified with 30-40% mid vessel lesion.   Left ventriculography: Left ventricular systolic function is abnormal, there is anterior hypokinesis.  LVEF is estimated at 45-50%, there is no significant mitral regurgitation   Final Conclusions:   1. 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 2. Mild LV dysfunction.  Recommendations: The ostial LAD is poorly suited for PCI. I would recommend CABG with bypass of the diagonal and LAD.  Sylvain Hasten [name],  MDFACC  12/18/2014, 2:27 PM

## 2014-12-18 NOTE — Consult Note (Signed)
Reason for Consult:Single vessel CAD s/p NSTEMI Referring Physician: Dr. Martinique  Cheryl Gill is an 64 y.o. female.  HPI: Cheryl Gill is a 64 yo woman with a history of hypertension, type II diabetes, hyperlipidemia, asthma, and stage III chronic kidney disease. She says her doctor told her she had a slight heart attack in the past, but she has no significant prior history of CAD.   She has been having exertional chest pain for the past few months. This was relieved by rest. Recently she has noticed it was becoming more frequent and more severe. Yesterday she had an episode while driving her car. She described as a pressure in the middle of her chest with radiation to the right arm. She had nausea and vomited once. She tried to relax but the pain persisted. She came to the ED. She was given ASA and NTG x 2 and her pain resolved. EKG was abnormal with dynamic anterior TW changes in V2 as well as I, avL. Her initial troponin was negative but she later ruled in for a non-STEMI.   Today she had cardiac catheterization which revealed severe single vessel CAD with a tight ostial LAD lesion with additional lesions further down in the LAD. She had mild disease, 30- 40%, in the mid RCA. Her left circumflex was free of disease.  She has not had any chest pain since admission.  Of note her creatinine was 1.49 on admission   Past Medical History    Past Medical History  Diagnosis Date  . Diabetes mellitus   . Hypertension   . Hypercholesterolemia   . Seasonal allergies   . CKD (chronic kidney disease), stage III   . Asthma     Past Surgical History  Procedure Laterality Date  . Abdominal hysterectomy    . Tonsillectomy      Family History  Problem Relation Age of Onset  . Pancreatic cancer Mother   . Stroke Father   . CAD Mother     Open heart surgery 60s-70s  . Heart disease Sister     Unclear details    Social History:  reports that she has never smoked. She has never used  smokeless tobacco. She reports that she does not drink alcohol or use illicit drugs.  Allergies:  Allergies  Allergen Reactions  . Ace Inhibitors Anaphylaxis  . Lisinopril Anaphylaxis  . Mushroom Extract Complex Swelling  . Tetanus Toxoids Nausea And Vomiting and Swelling    Medications:  Prior to Admission:  Prescriptions prior to admission  Medication Sig Dispense Refill Last Dose  . albuterol (PROVENTIL HFA;VENTOLIN HFA) 108 (90 BASE) MCG/ACT inhaler Inhale 2 puffs into the lungs every 6 (six) hours as needed. For shortness of breath or wheezing   12/16/2014 at Unknown time  . aspirin 81 MG tablet Take 81 mg by mouth daily.   12/16/2014 at Unknown time  . colchicine 0.6 MG tablet Take 0.6 mg by mouth 2 (two) times daily. As needed.   12/16/2014 at Unknown time  . Fluticasone-Salmeterol (ADVAIR) 250-50 MCG/DOSE AEPB Inhale 1 puff into the lungs daily as needed (for shortness of breath).   Past Week at Unknown time  . glipiZIDE (GLUCOTROL) 10 MG tablet Take 10 mg by mouth 2 (two) times daily before a meal.   12/16/2014 at Unknown time  . hydrochlorothiazide (HYDRODIURIL) 25 MG tablet Take 25 mg by mouth daily.   12/16/2014 at Unknown time  . linagliptin (TRADJENTA) 5 MG TABS tablet Take 5 mg by mouth  daily.   12/16/2014 at Unknown time  . metFORMIN (GLUCOPHAGE) 500 MG tablet Take by mouth 2 (two) times daily with a meal.   12/16/2014 at Unknown time  . simvastatin (ZOCOR) 10 MG tablet Take 10 mg by mouth at bedtime.   12/16/2014 at Unknown time  . ipratropium (ATROVENT) 0.06 % nasal spray Place 2 sprays into both nostrils 4 (four) times daily. (Patient not taking: Reported on 12/17/2014) 15 mL 1 Not Taking at Unknown time    Results for orders placed or performed during the hospital encounter of 12/17/14 (from the past 48 hour(s))  CBC     Status: Abnormal   Collection Time: 12/17/14  2:13 PM  Result Value Ref Range   WBC 11.5 (H) 4.0 - 10.5 K/uL   RBC 4.03 3.87 - 5.11 MIL/uL   Hemoglobin 12.5 12.0 -  15.0 g/dL   HCT 33.5 77.1 - 70.4 %   MCV 93.1 78.0 - 100.0 fL   MCH 31.0 26.0 - 34.0 pg   MCHC 33.3 30.0 - 36.0 g/dL   RDW 40.4 56.4 - 34.8 %   Platelets 212 150 - 400 K/uL  Basic metabolic panel     Status: Abnormal   Collection Time: 12/17/14  2:13 PM  Result Value Ref Range   Sodium 138 135 - 145 mmol/L    Comment: Please note change in reference range.   Potassium 4.1 3.5 - 5.1 mmol/L    Comment: Please note change in reference range.   Chloride 107 96 - 112 mEq/L   CO2 21 19 - 32 mmol/L   Glucose, Bld 351 (H) 70 - 99 mg/dL   BUN 18 6 - 23 mg/dL   Creatinine, Ser 5.78 (H) 0.50 - 1.10 mg/dL   Calcium 9.2 8.4 - 63.3 mg/dL   GFR calc non Af Amer 36 (L) >90 mL/min   GFR calc Af Amer 41 (L) >90 mL/min    Comment: (NOTE) The eGFR has been calculated using the CKD EPI equation. This calculation has not been validated in all clinical situations. eGFR's persistently <90 mL/min signify possible Chronic Kidney Disease.    Anion gap 10 5 - 15  Hepatic function panel     Status: Abnormal   Collection Time: 12/17/14  2:15 PM  Result Value Ref Range   Total Protein 6.8 6.0 - 8.3 g/dL   Albumin 3.8 3.5 - 5.2 g/dL   AST 29 0 - 37 U/L   ALT 27 0 - 35 U/L   Alkaline Phosphatase 93 39 - 117 U/L   Total Bilirubin 0.3 0.3 - 1.2 mg/dL   Bilirubin, Direct 0.1 0.0 - 0.3 mg/dL   Indirect Bilirubin 0.2 (L) 0.3 - 0.9 mg/dL  I-stat troponin, ED     Status: None   Collection Time: 12/17/14  2:22 PM  Result Value Ref Range   Troponin i, poc 0.06 0.00 - 0.08 ng/mL   Comment 3            Comment: Due to the release kinetics of cTnI, a negative result within the first hours of the onset of symptoms does not rule out myocardial infarction with certainty. If myocardial infarction is still suspected, repeat the test at appropriate intervals.   CBG monitoring, ED     Status: Abnormal   Collection Time: 12/17/14  4:54 PM  Result Value Ref Range   Glucose-Capillary 271 (H) 70 - 99 mg/dL   Protime-INR     Status: None   Collection Time: 12/17/14  6:08 PM  Result Value Ref Range   Prothrombin Time 15.1 11.6 - 15.2 seconds   INR 1.18 0.00 - 1.49  TSH     Status: None   Collection Time: 12/17/14  9:20 PM  Result Value Ref Range   TSH 2.125 0.350 - 4.500 uIU/mL  Troponin I-(serum)     Status: Abnormal   Collection Time: 12/17/14  9:21 PM  Result Value Ref Range   Troponin I 0.68 (HH) <0.031 ng/mL    Comment:        POSSIBLE MYOCARDIAL ISCHEMIA. SERIAL TESTING RECOMMENDED. Please note change in reference range. CRITICAL RESULT CALLED TO, READ BACK BY AND VERIFIED WITH: SMITH,B RN 12/17/2014 2251 JORDANS REPEATED TO VERIFY   Hemoglobin A1c     Status: Abnormal   Collection Time: 12/17/14  9:21 PM  Result Value Ref Range   Hgb A1c MFr Bld 9.0 (H) <5.7 %    Comment: (NOTE)                                                                       According to the ADA Clinical Practice Recommendations for 2011, when HbA1c is used as a screening test:  >=6.5%   Diagnostic of Diabetes Mellitus           (if abnormal result is confirmed) 5.7-6.4%   Increased risk of developing Diabetes Mellitus References:Diagnosis and Classification of Diabetes Mellitus,Diabetes OZDG,6440,34(VQQVZ 1):S62-S69 and Standards of Medical Care in         Diabetes - 2011,Diabetes Care,2011,34 (Suppl 1):S11-S61.    Mean Plasma Glucose 212 (H) <117 mg/dL    Comment: Performed at [facility]     Status: None   Collection Time: 12/17/14  9:21 PM  Result Value Ref Range   Prothrombin Time 14.3 11.6 - 15.2 seconds   INR 1.10 0.00 - 1.49  CBG monitoring, ED     Status: Abnormal   Collection Time: 12/18/14 12:18 AM  Result Value Ref Range   Glucose-Capillary 237 (H) 70 - 99 mg/dL  Heparin level (unfractionated)     Status: None   Collection Time: 12/18/14  1:20 AM  Result Value Ref Range   Heparin Unfractionated 0.37 0.30 - 0.70 IU/mL    Comment:        IF HEPARIN RESULTS  ARE BELOW EXPECTED VALUES, AND PATIENT DOSAGE HAS BEEN CONFIRMED, SUGGEST FOLLOW UP TESTING OF ANTITHROMBIN III LEVELS.   Troponin I-(serum)     Status: Abnormal   Collection Time: 12/18/14  1:20 AM  Result Value Ref Range   Troponin I 1.62 (HH) <0.031 ng/mL    Comment:        POSSIBLE MYOCARDIAL ISCHEMIA. SERIAL TESTING RECOMMENDED. Please note change in reference range. CRITICAL VALUE NOTED.  VALUE IS CONSISTENT WITH PREVIOUSLY REPORTED AND CALLED VALUE. REPEATED TO VERIFY   CBC     Status: Abnormal   Collection Time: 12/18/14  3:40 AM  Result Value Ref Range   WBC 13.4 (H) 4.0 - 10.5 K/uL   RBC 3.72 (L) 3.87 - 5.11 MIL/uL   Hemoglobin 11.5 (L) 12.0 - 15.0 g/dL   HCT 34.5 (L) 36.0 - 46.0 %   MCV 92.7 78.0 - 100.0 fL   MCH 30.9  26.0 - 34.0 pg   MCHC 33.3 30.0 - 36.0 g/dL   RDW 12.8 11.5 - 15.5 %   Platelets 228 150 - 400 K/uL  Lipid panel     Status: Abnormal   Collection Time: 12/18/14  3:40 AM  Result Value Ref Range   Cholesterol 110 0 - 200 mg/dL   Triglycerides 56 <150 mg/dL   HDL 37 (L) >39 mg/dL   Total CHOL/HDL Ratio 3.0 RATIO   VLDL 11 0 - 40 mg/dL   LDL Cholesterol 62 0 - 99 mg/dL    Comment:        Total Cholesterol/HDL:CHD Risk Coronary Heart Disease Risk Table                     Men   Women  1/2 Average Risk   3.4   3.3  Average Risk       5.0   4.4  2 X Average Risk   9.6   7.1  3 X Average Risk  23.4   11.0        Use the calculated Patient Ratio above and the CHD Risk Table to determine the patient's CHD Risk.        ATP III CLASSIFICATION (LDL):  <100     mg/dL   Optimal  100-129  mg/dL   Near or Above                    Optimal  130-159  mg/dL   Borderline  160-189  mg/dL   High  >190     mg/dL   Very High   Basic metabolic panel     Status: Abnormal   Collection Time: 12/18/14  3:40 AM  Result Value Ref Range   Sodium 140 135 - 145 mmol/L    Comment: Please note change in reference range.   Potassium 3.7 3.5 - 5.1 mmol/L     Comment: Please note change in reference range.   Chloride 108 96 - 112 mEq/L   CO2 23 19 - 32 mmol/L   Glucose, Bld 211 (H) 70 - 99 mg/dL   BUN 17 6 - 23 mg/dL   Creatinine, Ser 1.26 (H) 0.50 - 1.10 mg/dL   Calcium 8.9 8.4 - 10.5 mg/dL   GFR calc non Af Amer 44 (L) >90 mL/min   GFR calc Af Amer 51 (L) >90 mL/min    Comment: (NOTE) The eGFR has been calculated using the CKD EPI equation. This calculation has not been validated in all clinical situations. eGFR's persistently <90 mL/min signify possible Chronic Kidney Disease.    Anion gap 9 5 - 15  MRSA PCR Screening     Status: None   Collection Time: 12/18/14  4:43 AM  Result Value Ref Range   MRSA by PCR NEGATIVE NEGATIVE    Comment:        The GeneXpert MRSA Assay (FDA approved for NASAL specimens only), is one component of a comprehensive MRSA colonization surveillance program. It is not intended to diagnose MRSA infection nor to guide or monitor treatment for MRSA infections.   Troponin I-(serum)     Status: Abnormal   Collection Time: 12/18/14  7:43 AM  Result Value Ref Range   Troponin I 1.59 (HH) <0.031 ng/mL    Comment:        POSSIBLE MYOCARDIAL ISCHEMIA. SERIAL TESTING RECOMMENDED. Please note change in reference range. CRITICAL VALUE NOTED.  VALUE IS CONSISTENT WITH PREVIOUSLY REPORTED  AND CALLED VALUE. REPEATED TO VERIFY   Heparin level (unfractionated)     Status: None   Collection Time: 12/18/14  7:43 AM  Result Value Ref Range   Heparin Unfractionated 0.35 0.30 - 0.70 IU/mL    Comment:        IF HEPARIN RESULTS ARE BELOW EXPECTED VALUES, AND PATIENT DOSAGE HAS BEEN CONFIRMED, SUGGEST FOLLOW UP TESTING OF ANTITHROMBIN III LEVELS.   Glucose, capillary     Status: Abnormal   Collection Time: 12/18/14  7:51 AM  Result Value Ref Range   Glucose-Capillary 207 (H) 70 - 99 mg/dL   Comment 1 Capillary Sample   Glucose, capillary     Status: Abnormal   Collection Time: 12/18/14 12:01 PM  Result Value  Ref Range   Glucose-Capillary 176 (H) 70 - 99 mg/dL   Comment 1 Capillary Sample   Glucose, capillary     Status: Abnormal   Collection Time: 12/18/14  4:54 PM  Result Value Ref Range   Glucose-Capillary 194 (H) 70 - 99 mg/dL   Comment 1 Capillary Sample     Dg Chest Portable 1 View  12/17/2014   CLINICAL DATA:  Right chest pain.  EXAM: PORTABLE CHEST - 1 VIEW  COMPARISON:  PA and lateral chest 05/22/2014.  FINDINGS: Heart size and mediastinal contours are within normal limits. Both lungs are clear. Visualized skeletal structures are unremarkable.  IMPRESSION: Negative exam.   Electronically Signed   By: Inge Rise M.D.   On: 12/17/2014 13:46    Review of Systems  Constitutional: Positive for malaise/fatigue and diaphoresis. Negative for fever and chills.  Respiratory: Positive for shortness of breath.   Cardiovascular: Positive for chest pain. Negative for orthopnea, claudication and leg swelling.  Gastrointestinal: Positive for nausea and vomiting.  All other systems reviewed and are negative.  Blood pressure 124/68, pulse 75, temperature 98.1 F (36.7 C), temperature source Oral, resp. rate 17, height $RemoveBe'5\' 2"'rMitYYkWG$  (1.575 m), weight 195 lb 12.3 oz (88.8 kg), SpO2 96 %. Physical Exam  Vitals reviewed. Constitutional: She is oriented to person, place, and time. No distress.  obese  HENT:  Head: Normocephalic and atraumatic.  Eyes: EOM are normal. Pupils are equal, round, and reactive to light.  Neck: Neck supple. No thyromegaly present.  No carotid bruits  Cardiovascular: Normal rate, regular rhythm, normal heart sounds and intact distal pulses.  Exam reveals no gallop and no friction rub.   No murmur heard. Respiratory: Effort normal and breath sounds normal. She has no wheezes. She has no rales.  GI: Soft. There is no tenderness.  Musculoskeletal: She exhibits no edema.  Lymphadenopathy:    She has no cervical adenopathy.  Neurological: She is alert and oriented to person,  place, and time. No cranial nerve deficit.  No focal motor deficit  Skin: Skin is warm and dry.   CARDIAC CATHETERIZATION Coronary angiography: Coronary dominance: right  Left mainstem: Normal.  Left anterior descending (LAD): There is a 90-95% ostial LAD stenosis. The mid LAD has sequential 80-90% and 90-95% stenoses. The first diagonal is a moderately large branch without significant disease.  Left circumflex (LCx): The LCx is a single large OM and is normal.   Right coronary artery (RCA): Moderately calcified with 30-40% mid vessel lesion.   Left ventriculography: Left ventricular systolic function is abnormal, there is anterior hypokinesis. LVEF is estimated at 45-50%, there is no significant mitral regurgitation   Final Conclusions:  1. Single vessel obstructive CAD involving the ostium and mid  LAD. 2. Mild LV dysfunction.  Recommendations: The ostial LAD is poorly suited for PCI. I would recommend CABG with bypass of the diagonal and LAD.  Peter Martinique, MDFACC  ECHOCARDIOGRAM Study Conclusions  - Left ventricle: The cavity size was normal. Wall thickness was normal. Systolic function was normal. The estimated ejection fraction was in the range of 60% to 65%. Wall motion was normal; there were no regional wall motion abnormalities. Doppler parameters are consistent with abnormal left ventricular relaxation (grade 1 diastolic dysfunction). The E/e&' ratio is between 8-15, suggesting indeterminate LV filling pressure. - Left atrium: The atrium was normal in size. - Tricuspid valve: There was trivial regurgitation. - Pulmonary arteries: PA peak pressure: 23 mm Hg (S).  Impressions:  - LVEF 60-65%, normal wall thickness, normal wall motion, diastolic dysfunction, indeterminate LV filling pressure, normal LA size.  Assessment/Plan: 64 yo woman with multiple CRF presented with an unstable coronary syndrome and ruled in for a non-STEMI. At cath she has  severe single vessel CAD involving the ostium of her LAD that is not amenable to PCI. CABG is indicated for survival benefit and relief of symptoms.  I have discussed the general nature of the procedure, the need for general anesthesia, and the incisions to be used. We discussed the expected hospital stay, overall recovery and short and long term outcomes. I reviewed the indications, risks, benefits and alternatives. She understands the risks include, but are not limited to death, stroke, MI, DVT/PE, bleeding, possible need for transfusion, infections, irregular heart rhythms, and other organ system dysfunction including respiratory, renal, or GI complications.   She accepts these risks and agrees to proceed.  Currently first available OR time is Friday morning. Will plan for CABG x 2 then. Will move up if time becomes available.  Kathlen Sakurai C 12/18/2014, 6:10 PM

## 2014-12-18 NOTE — ED Notes (Signed)
Report called to 2H 

## 2014-12-18 NOTE — Progress Notes (Addendum)
VASCULAR LAB PRELIMINARY  PRELIMINARY  PRELIMINARY  PRELIMINARY  Pre-op Cardiac Surgery  Carotid Findings:  Bilateral:  1-39% ICA stenosis.  Vertebral artery flow is antegrade.     Upper Extremity Right Left  Brachial Pressures Triphasic - Radial access 112 Triphasic  Radial Waveforms Triphasic Triphasic  Ulnar Waveforms Triphasic Triphasic  Palmar Arch (Allen's Test) Normal Abnormal   Findings:  Doppler waveforms remained normal with both radial and ulnar compressions on the right. Left Doppler waveforms remained diminished greater that 50% with radial compression and remained normal with ulnar compression.    Lower  Extremity Right Left  Dorsalis Pedis 125 Triphasic 117 Triphasic  Posterior Tibial 126 Triphasic 142 Triphasic  Ankle/Brachial Indices 1.13 1.27    Findings:  ABIs and Doppler waveforms are within normal limits bilaterally at rest.   Keauna Brasel, RVS 12/18/2014, 4:41 PM

## 2014-12-18 NOTE — Progress Notes (Signed)
Subjective/Objective No subjective & objective note has been filed under this hospital service for this encounter.   Scheduled Meds: . [START ON 12/19/2014] aspirin EC  81 mg Oral Daily  . atorvastatin  80 mg Oral q1800  . glipiZIDE  10 mg Oral BID AC  . [START ON 12/19/2014] Influenza vac split quadrivalent PF  0.5 mL Intramuscular Tomorrow-1000  . insulin aspart  0-20 Units Subcutaneous TID WC  . [START ON 12/19/2014] linagliptin  5 mg Oral Daily  . metoprolol tartrate  12.5 mg Oral BID  . [START ON 12/19/2014] pneumococcal 23 valent vaccine  0.5 mL Intramuscular Tomorrow-1000   Continuous Infusions: . sodium chloride 100 mL/hr at 12/17/14 1702  . heparin 900 Units/hr (12/18/14 0700)  . nitroGLYCERIN Stopped (12/18/14 0732)   PRN Meds:acetaminophen, albuterol, mometasone-formoterol, nitroGLYCERIN, ondansetron (ZOFRAN) IV  Vital signs in last 24 hours: Temp:  [97.7 F (36.5 C)-98.1 F (36.7 C)] 97.7 F (36.5 C) (01/05 0730) Pulse Rate:  [53-90] 77 (01/05 1000) Resp:  [9-33] 14 (01/05 1000) BP: (94-140)/(52-96) 116/64 mmHg (01/05 1000) SpO2:  [82 %-100 %] 99 % (01/05 1000) Weight:  [195 lb 12.3 oz (88.8 kg)-203 lb (92.08 kg)] 195 lb 12.3 oz (88.8 kg) (01/05 0500)  Intake/Output last 3 shifts: I/O last 3 completed shifts: In: 70.1 [I.V.:70.1] Out: 200 [Urine:200] Intake/Output this shift: Total I/O In: 327 [I.V.:327] Out: -   Problem Assessment/Plan No new assessment & plan notes have been filed under this hospital service since the last note was generated.

## 2014-12-18 NOTE — Progress Notes (Signed)
ANTICOAGULATION CONSULT NOTE - Follow Up Consult  Pharmacy Consult for heparin gtt Indication: chest pain/ACS  Allergies  Allergen Reactions  . Ace Inhibitors Anaphylaxis  . Lisinopril Anaphylaxis  . Mushroom Extract Complex Swelling  . Tetanus Toxoids Nausea And Vomiting and Swelling    Patient Measurements: Weight: 203 lb (92.08 kg)  IBW: 50.1 kg Heparin Dosing Weight: 71.5 kg  Vital Signs: BP: 112/64 mmHg (01/05 0315) Pulse Rate: 69 (01/05 0315)  Labs:  Recent Labs  12/17/14 1413 12/17/14 1808 12/17/14 2121 12/18/14 0120  HGB 12.5  --   --   --   HCT 37.5  --   --   --   PLT 212  --   --   --   LABPROT  --  15.1 14.3  --   INR  --  1.18 1.10  --   HEPARINUNFRC  --   --   --  0.37  CREATININE 1.49*  --   --   --   TROPONINI  --   --  0.68* 1.62*    Estimated Creatinine Clearance: 39.8 mL/min (by C-G formula based on Cr of 1.49).   Medical History: Past Medical History  Diagnosis Date  . Diabetes mellitus   . Hypertension   . Hypercholesterolemia   . Seasonal allergies   . CKD (chronic kidney disease), stage III   . Asthma     Medications:   (Not in a hospital admission)  Assessment: 64 yo female admitted with chest pain, complains of intermittent chest pain for the past 2 months. Initial troponin negative but has trended up to 1.62. Currently on heparin infusion at 900 units/hr. Anticipate cath tomorrow. F/u HL therapeutic at 0.37. No bleeding noted.   Goal of Therapy:  Heparin level 0.3-0.7 units/ml Monitor platelets by anticoagulation protocol: Yes   Plan:  Continue heparin 900 units/hr Daily HL, CBC Check 6 hr confirmatory HL  Monitor for s/sx bleeding   Vinnie LevelBenjamin Braiden Presutti, PharmD., BCPS Clinical Pharmacist Pager [phone]

## 2014-12-18 NOTE — Care Management Note (Addendum)
    Page 1 of 1   12/27/2014     2:39:54 PM CARE MANAGEMENT NOTE 12/27/2014  Patient:  Cheryl Gill,Cheryl Gill   Account Number:  [id number]  Date Initiated:  12/18/2014  Documentation initiated by:  Junius CreamerWELL,DEBBIE  Subjective/Objective Assessment:   adm w angina     Action/Plan:   lives w fam, pcp dr Concepcion Elkavbuere   Anticipated DC Date:  12/27/2014   Anticipated DC Plan:  HOME/SELF CARE      DC Planning Services  CM consult      PAC Choice  DURABLE MEDICAL EQUIPMENT   Choice offered to / List presented to:     DME arranged  [facility]      DME agency  [facility]        Status of service:  Completed, signed off Medicare Important Message given?  YES (If response is "NO", the following Medicare IM given date fields will be blank) Date Medicare IM given:  12/20/2014 Medicare IM given by:  Junius CreamerWELL,DEBBIE Date Additional Medicare IM given:  12/25/2014 Additional Medicare IM given by:  Donn PieriniKRISTI Willies Laviolette  Discharge Disposition:  HOME/SELF CARE  Per UR Regulation:  Reviewed for med. necessity/level of care/duration of stay  If discussed at Long Length of Stay Meetings, dates discussed:    Comments:  12/27/14- 1100- Donn PieriniKristi Shaquitta Burbridge RN, BSN [phone] Pt for d/c home today, RW ordered- [facility]- RW to be delivered to room prior to discharge.

## 2014-12-18 NOTE — Progress Notes (Addendum)
   64 y/o woman with HTN, HLD, DM-2, Moderate Obesity (Metabolic Syndrome) who [facility]14 with persistent SSCP -(Angina) & has ruled in for NSTEMI.  Has note d~2 months of intermittent chest discomfort.  Subjective:  No more CP; Headache  Objective:  Vital Signs in the last 24 hours: Temp:  [97.7 F (36.5 C)-98.1 F (36.7 C)] 97.7 F (36.5 C) (01/05 0730) Pulse Rate:  [53-90] 77 (01/05 1000) Resp:  [9-33] 14 (01/05 1000) BP: (94-140)/(52-96) 116/64 mmHg (01/05 1000) SpO2:  [82 %-100 %] 99 % (01/05 1000) Weight:  [195 lb 12.3 oz (88.8 kg)-203 lb (92.08 kg)] 195 lb 12.3 oz (88.8 kg) (01/05 0500); Body mass index is 35.8 kg/(m^2).   Intake/Output from previous day: 01/04 0701 - 01/05 0700 In: 70.1 [I.V.:70.1] Out: 200 [Urine:200] Intake/Output from this shift: Total I/O In: 327 [I.V.:327] Out: -   Physical Exam: General appearance: alert, cooperative, appears stated age, no distress and moderately obese Neck: no adenopathy, no carotid bruit and no JVD Lungs: clear to auscultation bilaterally and normal percussion bilaterally Heart: regular rate and rhythm, S1, S2 normal, no murmur, click, rub or gallop and normal apical impulse Abdomen: soft, non-tender; bowel sounds normal; no masses,  no organomegaly Extremities: extremities normal, atraumatic, no cyanosis or edema Pulses: 2+ and symmetric Neurologic: Grossly normal  Lab Results:  Recent Labs  12/17/14 1413 12/18/14 0340  WBC 11.5* 13.4*  HGB 12.5 11.5*  PLT 212 228    Recent Labs  12/17/14 1413 12/18/14 0340  NA 138 140  K 4.1 3.7  CL 107 108  CO2 21 23  GLUCOSE 351* 211*  BUN 18 17  CREATININE 1.49* 1.26*    Recent Labs  12/18/14 0120 12/18/14 0743  TROPONINI 1.62* 1.59*   Hepatic Function Panel  Recent Labs  12/17/14 1415  PROT 6.8  ALBUMIN 3.8  AST 29  ALT 27  ALKPHOS 93  BILITOT 0.3  BILIDIR 0.1  IBILI 0.2*    Recent Labs  12/18/14 0340  CHOL 110   No  results for input(s): PROTIME in the last 72 hours.  Imaging: Imaging results have been reviewed   Cardiac Studies: EKGs with "dynamic ST-T changes/ TWI in precordial leads  Assessment/Plan:  Principal Problem:   NSTEMI (non-ST elevated myocardial infarction) Active Problems:   Unstable angina   Metabolic syndrome: DM, HTN, Obesity as well as HLD   Essential hypertension   Diabetes mellitus type 2, uncontrolled   Asthma, chronic   HLD (hyperlipidemia)   Obesity, Class II, BMI 35-39.9  NSTEMI - no further anginal pain, NTG gtt off  On IV Heparin & ASA  Posted for LHC-Angio +/- PCI this PM; consent obtained   On BB & statin  Was on Lisinopril + HCTZ @ home - would restart (likely @ lower dose) post cath.  Borderline glycemic control on Tradjenta & PO meds - covered with SSI (change to "Resistant)  No active Asthma Sx - PNA vaccine ordered.  PLAN: CATH today +/- PCI.  Further plan per Cath note.   LOS: 1 day    Lilyth Lawyer W 12/18/2014, 10:43 AM

## 2014-12-18 NOTE — ED Notes (Signed)
Pt reports pain in R arm has been relieved; no chest pain at this time

## 2014-12-18 NOTE — H&P (View-Only) (Signed)
   63 y/o woman with HTN, HLD, DM-2, Moderate Obesity (Metabolic Syndrome) who presented to [facility] ER on 12/17/14 with persistent SSCP -(Angina) & has ruled in for NSTEMI.  Has note d~2 months of intermittent chest discomfort.  Subjective:  No more CP; Headache  Objective:  Vital Signs in the last 24 hours: Temp:  [97.7 F (36.5 C)-98.1 F (36.7 C)] 97.7 F (36.5 C) (01/05 0730) Pulse Rate:  [53-90] 77 (01/05 1000) Resp:  [9-33] 14 (01/05 1000) BP: (94-140)/(52-96) 116/64 mmHg (01/05 1000) SpO2:  [82 %-100 %] 99 % (01/05 1000) Weight:  [195 lb 12.3 oz (88.8 kg)-203 lb (92.08 kg)] 195 lb 12.3 oz (88.8 kg) (01/05 0500); Body mass index is 35.8 kg/(m^2).   Intake/Output from previous day: 01/04 0701 - 01/05 0700 In: 70.1 [I.V.:70.1] Out: 200 [Urine:200] Intake/Output from this shift: Total I/O In: 327 [I.V.:327] Out: -   Physical Exam: General appearance: alert, cooperative, appears stated age, no distress and moderately obese Neck: no adenopathy, no carotid bruit and no JVD Lungs: clear to auscultation bilaterally and normal percussion bilaterally Heart: regular rate and rhythm, S1, S2 normal, no murmur, click, rub or gallop and normal apical impulse Abdomen: soft, non-tender; bowel sounds normal; no masses,  no organomegaly Extremities: extremities normal, atraumatic, no cyanosis or edema Pulses: 2+ and symmetric Neurologic: Grossly normal  Lab Results:  Recent Labs  12/17/14 1413 12/18/14 0340  WBC 11.5* 13.4*  HGB 12.5 11.5*  PLT 212 228    Recent Labs  12/17/14 1413 12/18/14 0340  NA 138 140  K 4.1 3.7  CL 107 108  CO2 21 23  GLUCOSE 351* 211*  BUN 18 17  CREATININE 1.49* 1.26*    Recent Labs  12/18/14 0120 12/18/14 0743  TROPONINI 1.62* 1.59*   Hepatic Function Panel  Recent Labs  12/17/14 1415  PROT 6.8  ALBUMIN 3.8  AST 29  ALT 27  ALKPHOS 93  BILITOT 0.3  BILIDIR 0.1  IBILI 0.2*    Recent Labs  12/18/14 0340  CHOL 110   No  results for input(s): PROTIME in the last 72 hours.  Imaging: Imaging results have been reviewed   Cardiac Studies: EKGs with "dynamic ST-T changes/ TWI in precordial leads  Assessment/Plan:  Principal Problem:   NSTEMI (non-ST elevated myocardial infarction) Active Problems:   Unstable angina   Metabolic syndrome: DM, HTN, Obesity as well as HLD   Essential hypertension   Diabetes mellitus type 2, uncontrolled   Asthma, chronic   HLD (hyperlipidemia)   Obesity, Class II, BMI 35-39.9  NSTEMI - no further anginal pain, NTG gtt off  On IV Heparin & ASA  Posted for LHC-Angio +/- PCI this PM; consent obtained   On BB & statin  Was on Lisinopril + HCTZ @ home - would restart (likely @ lower dose) post cath.  Borderline glycemic control on Tradjenta & PO meds - covered with SSI (change to "Resistant)  No active Asthma Sx - PNA vaccine ordered.  PLAN: CATH today +/- PCI.  Further plan per Cath note.   LOS: 1 day    Earle Troiano W 12/18/2014, 10:43 AM    

## 2014-12-18 NOTE — ED Notes (Signed)
CBG 237.[name] Kitchen.[name] Kitchen.Nurse notified

## 2014-12-19 ENCOUNTER — Encounter (HOSPITAL_COMMUNITY): Payer: Self-pay | Admitting: Cardiology

## 2014-12-19 ENCOUNTER — Inpatient Hospital Stay (HOSPITAL_COMMUNITY): Payer: Medicare Other

## 2014-12-19 DIAGNOSIS — I214 Non-ST elevation (NSTEMI) myocardial infarction: Secondary | ICD-10-CM | POA: Diagnosis not present

## 2014-12-19 DIAGNOSIS — E669 Obesity, unspecified: Secondary | ICD-10-CM

## 2014-12-19 DIAGNOSIS — E8881 Metabolic syndrome: Secondary | ICD-10-CM

## 2014-12-19 DIAGNOSIS — I10 Essential (primary) hypertension: Secondary | ICD-10-CM

## 2014-12-19 DIAGNOSIS — I25119 Atherosclerotic heart disease of native coronary artery with unspecified angina pectoris: Secondary | ICD-10-CM

## 2014-12-19 DIAGNOSIS — I2510 Atherosclerotic heart disease of native coronary artery without angina pectoris: Secondary | ICD-10-CM | POA: Clinically undetermined

## 2014-12-19 LAB — PULMONARY FUNCTION TEST
FEF 25-75 Post: 2.83 L/sec
FEF 25-75 Pre: 1.5 L/sec
FEF2575-%CHANGE-POST: 88 %
FEF2575-%PRED-POST: 163 %
FEF2575-%Pred-Pre: 86 %
FEV1-%Change-Post: 13 %
FEV1-%Pred-Post: 95 %
FEV1-%Pred-Pre: 83 %
FEV1-POST: 1.69 L
FEV1-Pre: 1.49 L
FEV1FVC-%CHANGE-POST: 3 %
FEV1FVC-%PRED-PRE: 108 %
FEV6-%Change-Post: 10 %
FEV6-%PRED-POST: 88 %
FEV6-%PRED-PRE: 80 %
FEV6-PRE: 1.77 L
FEV6-Post: 1.95 L
FEV6FVC-%Pred-Post: 104 %
FEV6FVC-%Pred-Pre: 104 %
FVC-%Change-Post: 10 %
FVC-%Pred-Post: 84 %
FVC-%Pred-Pre: 77 %
FVC-Post: 1.95 L
FVC-Pre: 1.77 L
POST FEV1/FVC RATIO: 87 %
POST FEV6/FVC RATIO: 100 %
PRE FEV6/FVC RATIO: 100 %
Pre FEV1/FVC ratio: 84 %

## 2014-12-19 LAB — CBC
HCT: 33.9 % — ABNORMAL LOW (ref 36.0–46.0)
HEMOGLOBIN: 11.5 g/dL — AB (ref 12.0–15.0)
MCH: 32.3 pg (ref 26.0–34.0)
MCHC: 33.9 g/dL (ref 30.0–36.0)
MCV: 95.2 fL (ref 78.0–100.0)
Platelets: 209 10*3/uL (ref 150–400)
RBC: 3.56 MIL/uL — ABNORMAL LOW (ref 3.87–5.11)
RDW: 12.8 % (ref 11.5–15.5)
WBC: 11 10*3/uL — ABNORMAL HIGH (ref 4.0–10.5)

## 2014-12-19 LAB — HEPARIN LEVEL (UNFRACTIONATED)
Heparin Unfractionated: 0.13 IU/mL — ABNORMAL LOW (ref 0.30–0.70)
Heparin Unfractionated: 0.55 IU/mL (ref 0.30–0.70)
Heparin Unfractionated: 0.62 IU/mL (ref 0.30–0.70)

## 2014-12-19 LAB — GLUCOSE, CAPILLARY
GLUCOSE-CAPILLARY: 194 mg/dL — AB (ref 70–99)
GLUCOSE-CAPILLARY: 272 mg/dL — AB (ref 70–99)
Glucose-Capillary: 175 mg/dL — ABNORMAL HIGH (ref 70–99)
Glucose-Capillary: 231 mg/dL — ABNORMAL HIGH (ref 70–99)

## 2014-12-19 MED ORDER — METOPROLOL TARTRATE 50 MG PO TABS
50.0000 mg | ORAL_TABLET | Freq: Two times a day (BID) | ORAL | Status: DC
Start: 2014-12-20 — End: 2014-12-21
  Administered 2014-12-20 (×2): 50 mg via ORAL
  Filled 2014-12-19 (×3): qty 1
  Filled 2014-12-19: qty 2
  Filled 2014-12-19: qty 1

## 2014-12-19 MED ORDER — METOPROLOL TARTRATE 25 MG PO TABS
25.0000 mg | ORAL_TABLET | Freq: Two times a day (BID) | ORAL | Status: DC
Start: 2014-12-19 — End: 2014-12-19
  Administered 2014-12-19: 25 mg via ORAL
  Filled 2014-12-19: qty 1

## 2014-12-19 MED ORDER — AMIODARONE HCL IN DEXTROSE 360-4.14 MG/200ML-% IV SOLN
INTRAVENOUS | Status: AC
  Filled 2014-12-19: qty 200

## 2014-12-19 MED ORDER — ALBUTEROL SULFATE (2.5 MG/3ML) 0.083% IN NEBU
2.5000 mg | INHALATION_SOLUTION | Freq: Once | RESPIRATORY_TRACT | Status: AC
  Administered 2014-12-19: 2.5 mg via RESPIRATORY_TRACT

## 2014-12-19 MED ORDER — METOPROLOL TARTRATE 12.5 MG HALF TABLET
12.5000 mg | ORAL_TABLET | Freq: Once | ORAL | Status: AC
  Administered 2014-12-19: 12.5 mg via ORAL
  Filled 2014-12-19: qty 1

## 2014-12-19 NOTE — Progress Notes (Signed)
[phone] Did not walk with pt as she had CP this morning. Gave [facility] booklet and care guide. Wrote down how to watch preop video. Discussed sternal precautions and getting up and down without use of arms. Pt has IS and stated she can get it almost to the top. Encouraged her to use today unless if gives her CP. Husband states he and children will be available after discharge to care for pt. Will follow up after surgery unless order given to walk prior to surgery. Luetta NuttingCharlene Abdulah Iqbal RN BSN 12/19/2014 9:18 AM

## 2014-12-19 NOTE — Progress Notes (Signed)
Inpatient Diabetes Program Recommendations  AACE/ADA: New Consensus Statement on Inpatient Glycemic Control (2013)  Target Ranges:  Prepandial:   less than 140 mg/dL      Peak postprandial:   less than 180 mg/dL (1-2 hours)      Critically ill patients:  140 - 180 mg/dL   Inpatient Diabetes Program Recommendations Insulin - Basal: add Lantus 10 units  A1C=9 12/17/14 Thank you  Piedad ClimesGina Rosella Crandell BSN, RN,CDE Inpatient Diabetes Coordinator [phone] (team pager)

## 2014-12-19 NOTE — Progress Notes (Signed)
Pt had sudden onset of 5/10 chest pain at 22:28. Denied nausea, shortness of breath, or radiating pain. BP slightly elevated to 151/91. EKG performed and sublingual nitro given.  MD paged and order for IV nitro. IV nitro delayed start due to patient's low BP at the time post sublingual nitro. Patient started on IV nitro at 2350. Patient now denies pain and is resting.

## 2014-12-19 NOTE — Progress Notes (Signed)
   64 y/o woman with HTN, HLD, DM-2, Moderate Obesity (Metabolic Syndrome) who presented to [facility] on 12/17/14 with persistent SSCP -(Angina) & has ruled in for NSTEMI.  Has note d~2 months of intermittent chest discomfort.  Heart Position revealed severe ostial LAD disease. CT surgical consultation yesterday with Dr. Dorris FetchHendrickson. Planned CABG Friday January 8  Subjective:  No more CP; Headache She did have some chest tightness last night treated with nitroglycerin.  Objective:  Vital Signs in the last 24 hours: Temp:  [97.6 F (36.4 C)-98.7 F (37.1 C)] 97.8 F (36.6 C) (01/06 1200) Pulse Rate:  [70-96] 78 (01/06 1200) Resp:  [13-23] 20 (01/06 1200) BP: (94-151)/(47-95) 113/71 mmHg (01/06 1200) SpO2:  [92 %-98 %] 95 % (01/06 1200) Weight:  [199 lb (90.266 kg)] 199 lb (90.266 kg) (01/06 0358); Body mass index is 36.39 kg/(m^2).   Intake/Output from previous day: 01/05 0701 - 01/06 0700 In: 1822.2 [P.O.:150; I.V.:1672.2] Out: 1350 [Urine:1350] Intake/Output from this shift: Total I/O In: 50 [I.V.:50] Out: 175 [Urine:175]  Physical Exam: General appearance: alert, cooperative, appears stated age, no distress and moderately obese Neck: no adenopathy, no carotid bruit and no JVD Lungs: clear to auscultation bilaterally and normal percussion bilaterally Heart: regular rate and rhythm, S1, S2 normal, no murmur, click, rub or gallop and normal apical impulse Abdomen: soft, non-tender; bowel sounds normal; no masses,  no organomegaly Extremities: extremities normal, atraumatic, no cyanosis or edema Pulses: 2+ and symmetric Neurologic: Grossly normal  Lab Results:  Recent Labs  12/18/14 0340 12/19/14 0214  WBC 13.4* 11.0*  HGB 11.5* 11.5*  PLT 228 209    Recent Labs  12/17/14 1413 12/18/14 0340  NA 138 140  K 4.1 3.7  CL 107 108  CO2 21 23  GLUCOSE 351* 211*  BUN 18 17  CREATININE 1.49* 1.26*    Recent Labs  12/18/14 0120 12/18/14 0743  TROPONINI 1.62* 1.59*    Hepatic Function Panel  Recent Labs  12/17/14 1415  PROT 6.8  ALBUMIN 3.8  AST 29  ALT 27  ALKPHOS 93  BILITOT 0.3  BILIDIR 0.1  IBILI 0.2*    Recent Labs  12/18/14 0340  CHOL 110   No results for input(s): PROTIME in the last 72 hours.  Imaging: Imaging results have been reviewed   Cardiac Studies: EKGs with "dynamic ST-T changes/ TWI in precordial leads  Assessment/Plan:  Principal Problem:   NSTEMI (non-ST elevated myocardial infarction) Active Problems:   Unstable angina   Metabolic syndrome: DM, HTN, Obesity as well as HLD   Essential hypertension   Diabetes mellitus type 2, uncontrolled   Asthma, chronic   HLD (hyperlipidemia)   Obesity, Class II, BMI 35-39.9  NSTEMI - no further anginal pain, NTG gtt at low-dose  Severe ostial LAD disease. Planned two-vessel CABG on Friday - medical management until then.  On IV Heparin & ASA  On BB & statin  Was on Lisinopril + HCTZ @ home - would restart (likely @ lower dose) post CABG as we are currently using nitroglycerin which is keeping her blood pressure controlled  Borderline glycemic control on Tradjenta & PO meds - covered with SSI (changed to "Resistant)  No active Asthma Sx - PNA vaccine ordered.  PLAN: CABG on Friday. We'll keep mostly bed rest with bathroom privileges   LOS: 2 days    Jossue Rubenstein W 12/19/2014, 3:50 PM

## 2014-12-19 NOTE — Progress Notes (Signed)
ANTICOAGULATION CONSULT NOTE - Follow Up Consult  Pharmacy Consult for Heparin  Indication: Awaiting CABG  Allergies  Allergen Reactions  . Ace Inhibitors Anaphylaxis  . Lisinopril Anaphylaxis  . Mushroom Extract Complex Swelling  . Tetanus Toxoids Nausea And Vomiting and Swelling    Patient Measurements: Height: 5\' 2"  (157.5 cm) Weight: 199 lb (90.266 kg) IBW/kg (Calculated) : 50.1  Vital Signs: Temp: 98.7 F (37.1 C) (01/06 0357) Temp Source: Oral (01/06 0357) BP: 121/77 mmHg (01/06 0300) Pulse Rate: 75 (01/06 0357)  Labs:  Recent Labs  12/17/14 1413 12/17/14 1808 12/17/14 2121 12/18/14 0120 12/18/14 0340 12/18/14 0743 12/19/14 0214  HGB 12.5  --   --   --  11.5*  --  11.5*  HCT 37.5  --   --   --  34.5*  --  33.9*  PLT 212  --   --   --  228  --  209  LABPROT  --  15.1 14.3  --   --   --   --   INR  --  1.18 1.10  --   --   --   --   HEPARINUNFRC  --   --   --  0.37  --  0.35 0.13*  CREATININE 1.49*  --   --   --  1.26*  --   --   TROPONINI  --   --  0.68* 1.62*  --  1.59*  --     Estimated Creatinine Clearance: 46.5 mL/min (by C-G formula based on Cr of 1.26).   Assessment: Sub-therapeutic heparin level x 1 after re-start s/p cath. No issues per RN.   Goal of Therapy:  Heparin level 0.3-0.7 units/ml Monitor platelets by anticoagulation protocol: Yes   Plan:  -Increase heparin to 1100 units/hr -1200 HL -Daily CBC/HL -Monitor for bleeding  Abran DukeLedford, Madinah Quarry 12/19/2014,4:09 AM

## 2014-12-19 NOTE — Progress Notes (Signed)
ANTICOAGULATION CONSULT NOTE - Follow Up Consult  Pharmacy Consult for Heparin Indication: NSTEMI, CABG Friday  Allergies  Allergen Reactions  . Ace Inhibitors Anaphylaxis  . Lisinopril Anaphylaxis  . Mushroom Extract Complex Swelling  . Tetanus Toxoids Nausea And Vomiting and Swelling    Patient Measurements: Height: 5\' 2"  (157.5 cm) Weight: 199 lb (90.266 kg) IBW/kg (Calculated) : 50.1 Heparin Dosing Weight: 70kg Vital Signs: Temp: 97.8 F (36.6 C) (01/06 1200) Temp Source: Oral (01/06 1200) BP: 113/71 mmHg (01/06 1200) Pulse Rate: 78 (01/06 1200)  Labs:  Recent Labs  12/17/14 1413 12/17/14 1808 12/17/14 2121  12/18/14 0120 12/18/14 0340 12/18/14 0743 12/19/14 0214 12/19/14 1155  HGB 12.5  --   --   --   --  11.5*  --  11.5*  --   HCT 37.5  --   --   --   --  34.5*  --  33.9*  --   PLT 212  --   --   --   --  228  --  209  --   LABPROT  --  15.1 14.3  --   --   --   --   --   --   INR  --  1.18 1.10  --   --   --   --   --   --   HEPARINUNFRC  --   --   --   < > 0.37  --  0.35 0.13* 0.55  CREATININE 1.49*  --   --   --   --  1.26*  --   --   --   TROPONINI  --   --  0.68*  --  1.62*  --  1.59*  --   --   < > = values in this interval not displayed.  Estimated Creatinine Clearance: 46.5 mL/min (by C-G formula based on Cr of 1.26).   Medications:  . heparin 1,100 Units/hr (12/19/14 0424)  . nitroGLYCERIN 5 mcg/min (12/19/14 0055)    Assessment: 63 yof continues on heparin for NSTEMI, s/p cath, awaiting CABG on Friday.  Heparin level is therapeutic on heparin at 1100 units/hr.  No bleeding or complications noted.  CBC stable.  Goal of Therapy:  Heparin level 0.3-0.7 units/ml Monitor platelets by anticoagulation protocol: Yes   Plan:  1) Continue IV heparin at 1100 units/hr. 2) Confirm heparin level in 6 hrs. 3) Daily heparin level and CBC.  Tad MooreJessica Rio Kidane, Pharm D, BCPS  Clinical Pharmacist Pager [phone]  12/19/2014 1:23 PM

## 2014-12-19 NOTE — Progress Notes (Signed)
ANTICOAGULATION CONSULT NOTE - Follow Up Consult  Pharmacy Consult for Heparin Indication: NSTEMI, CABG Friday  Allergies  Allergen Reactions  . Ace Inhibitors Anaphylaxis  . Lisinopril Anaphylaxis  . Mushroom Extract Complex Swelling  . Tetanus Toxoids Nausea And Vomiting and Swelling    Patient Measurements: Height: 5\' 2"  (157.5 cm) Weight: 199 lb (90.266 kg) IBW/kg (Calculated) : 50.1 Heparin Dosing Weight: 70kg Vital Signs: Temp: 98.2 F (36.8 C) (01/06 1701) Temp Source: Oral (01/06 1701) BP: 113/69 mmHg (01/06 1701) Pulse Rate: 76 (01/06 1701)  Labs:  Recent Labs  12/17/14 1413 12/17/14 1808 12/17/14 2121  12/18/14 0120 12/18/14 0340 12/18/14 0743 12/19/14 0214 12/19/14 1155 12/19/14 1830  HGB 12.5  --   --   --   --  11.5*  --  11.5*  --   --   HCT 37.5  --   --   --   --  34.5*  --  33.9*  --   --   PLT 212  --   --   --   --  228  --  209  --   --   LABPROT  --  15.1 14.3  --   --   --   --   --   --   --   INR  --  1.18 1.10  --   --   --   --   --   --   --   HEPARINUNFRC  --   --   --   < > 0.37  --  0.35 0.13* 0.55 0.62  CREATININE 1.49*  --   --   --   --  1.26*  --   --   --   --   TROPONINI  --   --  0.68*  --  1.62*  --  1.59*  --   --   --   < > = values in this interval not displayed.  Estimated Creatinine Clearance: 46.5 mL/min (by C-G formula based on Cr of 1.26).   Medications:  . heparin 1,100 Units/hr (12/19/14 0424)  . nitroGLYCERIN 5 mcg/min (12/19/14 0055)    Assessment: Cheryl Gill continues on heparin for NSTEMI, s/p cath, awaiting CABG on Friday.  Heparin level is therapeutic on heparin at 1100 units/hr.  No bleeding or complications noted.  CBC stable.  Confirmatory HL remains therapeutic at 0.62 on heparin 1100 units/hr. No bleeding noted.  Goal of Therapy:  Heparin level 0.3-0.7 units/ml Monitor platelets by anticoagulation protocol: Yes   Plan:  1) Continue IV heparin at 1100 units/hr 2) Daily heparin level and CBC 3)  Monitor s/sx of bleeding  Arlean Hoppingorey M. Newman PiesBall, PharmD Clinical Pharmacist Pager [phone]  12/19/2014 7:34 PM

## 2014-12-20 LAB — GLUCOSE, CAPILLARY
GLUCOSE-CAPILLARY: 193 mg/dL — AB (ref 70–99)
GLUCOSE-CAPILLARY: 215 mg/dL — AB (ref 70–99)
Glucose-Capillary: 213 mg/dL — ABNORMAL HIGH (ref 70–99)
Glucose-Capillary: 245 mg/dL — ABNORMAL HIGH (ref 70–99)

## 2014-12-20 LAB — URINALYSIS, ROUTINE W REFLEX MICROSCOPIC
Bilirubin Urine: NEGATIVE
Glucose, UA: 100 mg/dL — AB
HGB URINE DIPSTICK: NEGATIVE
Ketones, ur: NEGATIVE mg/dL
Nitrite: NEGATIVE
Protein, ur: NEGATIVE mg/dL
Specific Gravity, Urine: 1.013 (ref 1.005–1.030)
UROBILINOGEN UA: 0.2 mg/dL (ref 0.0–1.0)
pH: 6.5 (ref 5.0–8.0)

## 2014-12-20 LAB — BLOOD GAS, ARTERIAL
ACID-BASE DEFICIT: 2.9 mmol/L — AB (ref 0.0–2.0)
BICARBONATE: 20.5 meq/L (ref 20.0–24.0)
Drawn by: 39898
FIO2: 0.21 %
O2 SAT: 98.2 %
PATIENT TEMPERATURE: 98.6
PH ART: 7.447 (ref 7.350–7.450)
TCO2: 21.4 mmol/L (ref 0–100)
pCO2 arterial: 30.1 mmHg — ABNORMAL LOW (ref 35.0–45.0)
pO2, Arterial: 112 mmHg — ABNORMAL HIGH (ref 80.0–100.0)

## 2014-12-20 LAB — CBC
HCT: 33.3 % — ABNORMAL LOW (ref 36.0–46.0)
HEMOGLOBIN: 11.1 g/dL — AB (ref 12.0–15.0)
MCH: 31.4 pg (ref 26.0–34.0)
MCHC: 33.3 g/dL (ref 30.0–36.0)
MCV: 94.1 fL (ref 78.0–100.0)
PLATELETS: 199 10*3/uL (ref 150–400)
RBC: 3.54 MIL/uL — ABNORMAL LOW (ref 3.87–5.11)
RDW: 12.7 % (ref 11.5–15.5)
WBC: 11.9 10*3/uL — ABNORMAL HIGH (ref 4.0–10.5)

## 2014-12-20 LAB — URINE MICROSCOPIC-ADD ON

## 2014-12-20 LAB — HEPARIN LEVEL (UNFRACTIONATED): HEPARIN UNFRACTIONATED: 0.45 [IU]/mL (ref 0.30–0.70)

## 2014-12-20 MED ORDER — PHENYLEPHRINE HCL 10 MG/ML IJ SOLN
30.0000 ug/min | INTRAVENOUS | Status: AC
  Administered 2014-12-21: 20 ug/min via INTRAVENOUS
  Filled 2014-12-20: qty 2

## 2014-12-20 MED ORDER — DEXMEDETOMIDINE HCL IN NACL 400 MCG/100ML IV SOLN
0.1000 ug/kg/h | INTRAVENOUS | Status: AC
  Administered 2014-12-21: 0.2 ug/kg/h via INTRAVENOUS
  Filled 2014-12-20: qty 100

## 2014-12-20 MED ORDER — NITROGLYCERIN IN D5W 200-5 MCG/ML-% IV SOLN
2.0000 ug/min | INTRAVENOUS | Status: DC
  Filled 2014-12-20: qty 250

## 2014-12-20 MED ORDER — ALPRAZOLAM 0.25 MG PO TABS
0.2500 mg | ORAL_TABLET | ORAL | Status: DC | PRN
  Administered 2014-12-26: 0.25 mg via ORAL
  Filled 2014-12-20: qty 1

## 2014-12-20 MED ORDER — DEXTROSE 5 % IV SOLN
750.0000 mg | INTRAVENOUS | Status: DC
  Filled 2014-12-20: qty 750

## 2014-12-20 MED ORDER — CHLORHEXIDINE GLUCONATE CLOTH 2 % EX PADS
6.0000 | MEDICATED_PAD | Freq: Once | CUTANEOUS | Status: AC
  Administered 2014-12-20: 6 via TOPICAL

## 2014-12-20 MED ORDER — DIAZEPAM 5 MG PO TABS
5.0000 mg | ORAL_TABLET | Freq: Once | ORAL | Status: AC
  Administered 2014-12-21: 5 mg via ORAL
  Filled 2014-12-20: qty 1

## 2014-12-20 MED ORDER — SODIUM CHLORIDE 0.9 % IV SOLN
INTRAVENOUS | Status: AC
  Administered 2014-12-21: 69.8 mL/h via INTRAVENOUS
  Filled 2014-12-20: qty 40

## 2014-12-20 MED ORDER — POTASSIUM CHLORIDE 2 MEQ/ML IV SOLN
80.0000 meq | INTRAVENOUS | Status: DC
  Filled 2014-12-20: qty 40

## 2014-12-20 MED ORDER — VANCOMYCIN HCL 10 G IV SOLR
1500.0000 mg | INTRAVENOUS | Status: AC
  Administered 2014-12-21: 1500 mg via INTRAVENOUS
  Filled 2014-12-20: qty 1500

## 2014-12-20 MED ORDER — SODIUM CHLORIDE 0.9 % IV SOLN
INTRAVENOUS | Status: AC
  Administered 2014-12-21: 4.8 [IU]/h via INTRAVENOUS
  Filled 2014-12-20: qty 2.5

## 2014-12-20 MED ORDER — DOPAMINE-DEXTROSE 3.2-5 MG/ML-% IV SOLN
0.0000 ug/kg/min | INTRAVENOUS | Status: AC
  Administered 2014-12-21: 3 ug/kg/min via INTRAVENOUS
  Filled 2014-12-20: qty 250

## 2014-12-20 MED ORDER — SODIUM CHLORIDE 0.9 % IV SOLN
INTRAVENOUS | Status: DC
  Filled 2014-12-20: qty 30

## 2014-12-20 MED ORDER — METOPROLOL TARTRATE 12.5 MG HALF TABLET
12.5000 mg | ORAL_TABLET | Freq: Once | ORAL | Status: AC
  Administered 2014-12-21: 12.5 mg via ORAL
  Filled 2014-12-20: qty 1

## 2014-12-20 MED ORDER — CHLORHEXIDINE GLUCONATE CLOTH 2 % EX PADS: 6.0000 | MEDICATED_PAD | Freq: Once | CUTANEOUS | Status: AC

## 2014-12-20 MED ORDER — BISACODYL 5 MG PO TBEC
5.0000 mg | DELAYED_RELEASE_TABLET | Freq: Once | ORAL | Status: AC
  Administered 2014-12-20: 5 mg via ORAL
  Filled 2014-12-20: qty 1

## 2014-12-20 MED ORDER — DEXTROSE 5 % IV SOLN
1.5000 g | INTRAVENOUS | Status: AC
  Administered 2014-12-21: .75 g via INTRAVENOUS
  Administered 2014-12-21: 1.5 g via INTRAVENOUS
  Filled 2014-12-20: qty 1.5

## 2014-12-20 MED ORDER — MAGNESIUM SULFATE 50 % IJ SOLN
40.0000 meq | INTRAMUSCULAR | Status: DC
  Filled 2014-12-20: qty 10

## 2014-12-20 MED ORDER — TEMAZEPAM 15 MG PO CAPS
15.0000 mg | ORAL_CAPSULE | Freq: Once | ORAL | Status: AC | PRN
  Administered 2014-12-20: 15 mg via ORAL
  Filled 2014-12-20: qty 1

## 2014-12-20 MED ORDER — PLASMA-LYTE 148 IV SOLN
INTRAVENOUS | Status: AC
  Administered 2014-12-21: 09:00:00
  Filled 2014-12-20: qty 2.5

## 2014-12-20 MED ORDER — ZOLPIDEM TARTRATE 5 MG PO TABS: 5.0000 mg | ORAL_TABLET | Freq: Every evening | ORAL | Status: DC | PRN

## 2014-12-20 MED ORDER — EPINEPHRINE HCL 1 MG/ML IJ SOLN
0.0000 ug/min | INTRAVENOUS | Status: DC
  Filled 2014-12-20: qty 4

## 2014-12-20 NOTE — Progress Notes (Signed)
64 y/o woman with HTN, HLD, DM-2, Moderate Obesity (Metabolic Syndrome) who presented to [facility] ER on 12/17/14 with persistent SSCP -(Angina) & has ruled in for NSTEMI.  Has note d~2 months of intermittent chest discomfort.  Heart Position revealed severe ostial LAD disease. CT surgical consultation yesterday with Dr. Dorris Fetch. Planned CABG Friday January 8  Subjective:  Did relatively well overnight, but did note having chest discomfort with getting up to use the restroom. Bedside commode ordered..  Objective:  Vital Signs in the last 24 hours: Temp:  [97.4 F (36.3 C)-98.5 F (36.9 C)] 98.3 F (36.8 C) (01/07 1211) Pulse Rate:  [68-107] 78 (01/07 1211) Resp:  [10-25] 25 (01/07 1211) BP: (108-158)/(60-96) 115/68 mmHg (01/07 1211) SpO2:  [93 %-99 %] 98 % (01/07 1211) Weight:  [198 lb 9.6 oz (90.084 kg)] 198 lb 9.6 oz (90.084 kg) (01/07 0800); Body mass index is 36.32 kg/(m^2).   Intake/Output from previous day: 01/06 0701 - 01/07 0700 In: 300.4 [I.V.:300.4] Out: 475 [Urine:475] Intake/Output from this shift: Total I/O In: 302.5 [P.O.:240; I.V.:62.5] Out: 200 [Urine:200]  Physical Exam: General appearance: alert, cooperative, appears stated age, no distress and moderately obese Neck: no adenopathy, no carotid bruit and no JVD Lungs: clear to auscultation bilaterally and normal percussion bilaterally Heart: regular rate and rhythm, S1, S2 normal, no murmur, click, rub or gallop and normal apical impulse Abdomen: soft, non-tender; bowel sounds normal; no masses,  no organomegaly Extremities: extremities normal, atraumatic, no cyanosis or edema Pulses: 2+ and symmetric Neurologic: Grossly normal  Lab Results:  Recent Labs  12/19/14 0214 12/20/14 0307  WBC 11.0* 11.9*  HGB 11.5* 11.1*  PLT 209 199    Recent Labs  12/17/14 1413 12/18/14 0340  NA 138 140  K 4.1 3.7  CL 107 108  CO2 21 23  GLUCOSE 351* 211*  BUN 18 17  CREATININE 1.49* 1.26*    Recent Labs  12/18/14 0120 12/18/14 0743  TROPONINI 1.62* 1.59*   Hepatic Function Panel  Recent Labs  12/17/14 1415  PROT 6.8  ALBUMIN 3.8  AST 29  ALT 27  ALKPHOS 93  BILITOT 0.3  BILIDIR 0.1  IBILI 0.2*    Recent Labs  12/18/14 0340  CHOL 110   No results for input(s): PROTIME in the last 72 hours.  Imaging: Imaging results have been reviewed   Cardiac Studies:   Echocardiogram: EF 60-65%, no regional WMA, grade 1 DD  Cardiac Catheterization: 90-95% ostial LAD, mid LAD sequential 80 and 90 and 90-95%. D1 moderate sized without significant disease but jeopardized by proximal lesion. Otherwise mild disease in RCA and circumflex.  Based on ostial LAD lesion, recommended CABG  Assessment/Plan:  Principal Problem:   NSTEMI (non-ST elevated myocardial infarction) Active Problems:   Unstable angina   Metabolic syndrome: DM, HTN, Obesity as well as HLD   Ostial LAD disease   Essential hypertension   Diabetes mellitus type 2, uncontrolled   Asthma, chronic   HLD (hyperlipidemia)   Obesity, Class II, BMI 35-39.9  NSTEMI - no further anginal pain, NTG gtt at low-dose  Severe ostial LAD disease. Planned two-vessel CABG on Friday - medical management until then.  If she does have more chest pain with simply going to bedside commode, may need to make complete bed rest.  On IV Heparin & ASA  On BB & statin  Was on Lisinopril + HCTZ @ home - would restart (likely @ lower dose) post CABG as we are currently using nitroglycerin which  is keeping her blood pressure controlled  Borderline glycemic control on Tradjenta & PO meds - covered with SSI (changed to "Resistant)  No active Asthma Sx - PNA vaccine ordered.  PLAN: CABG on Friday. We'll keep mostly bed rest with bedside commode  Would recommend cardiac rehabilitation on discharge in order to work on dietary modifications and exercise to ensure weight loss.   LOS: 3 days    HARDING, DAVID W 12/20/2014, 12:39 PM

## 2014-12-20 NOTE — Progress Notes (Signed)
2 Days Post-Op Procedure(s) (LRB): LEFT HEART CATHETERIZATION WITH CORONARY ANGIOGRAM (N/A) Subjective: No complaints at present bu is still having CP when she is up and around  Objective: Vital signs in last 24 hours: Temp:  [97.4 F (36.3 C)-98.5 F (36.9 C)] 98.3 F (36.8 C) (01/07 1211) Pulse Rate:  [68-107] 78 (01/07 1211) Cardiac Rhythm:  [-] Normal sinus rhythm (01/07 0800) Resp:  [10-25] 25 (01/07 1211) BP: (108-158)/(60-96) 115/68 mmHg (01/07 1211) SpO2:  [93 %-99 %] 98 % (01/07 1211) Weight:  [198 lb 9.6 oz (90.084 kg)] 198 lb 9.6 oz (90.084 kg) (01/07 0800)  Hemodynamic parameters for last 24 hours:    Intake/Output from previous day: 01/06 0701 - 01/07 0700 In: 300.4 [I.V.:300.4] Out: 475 [Urine:475] Intake/Output this shift: Total I/O In: 302.5 [P.O.:240; I.V.:62.5] Out: 200 [Urine:200]  General appearance: alert and no distress Heart: regular rate and rhythm Lungs: clear to auscultation bilaterally  Lab Results:  Recent Labs  12/19/14 0214 12/20/14 0307  WBC 11.0* 11.9*  HGB 11.5* 11.1*  HCT 33.9* 33.3*  PLT 209 199   BMET:  Recent Labs  12/17/14 1413 12/18/14 0340  NA 138 140  K 4.1 3.7  CL 107 108  CO2 21 23  GLUCOSE 351* 211*  BUN 18 17  CREATININE 1.49* 1.26*  CALCIUM 9.2 8.9    PT/INR:  Recent Labs  12/17/14 2121  LABPROT 14.3  INR 1.10   ABG No results found for: PHART, HCO3, TCO2, ACIDBASEDEF, O2SAT CBG (last 3)   Recent Labs  12/19/14 2116 12/20/14 0813 12/20/14 1212  GLUCAP 231* 213* 193*    Assessment/Plan: S/P Procedure(s) (LRB): LEFT HEART CATHETERIZATION WITH CORONARY ANGIOGRAM (N/A) Severe single vessel CAD For CABG x 2 in Am All questions answered   LOS: 3 days    Cheryl Gill C 12/20/2014

## 2014-12-20 NOTE — Progress Notes (Addendum)
ANTICOAGULATION CONSULT NOTE - Follow Up Consult  Pharmacy Consult for Heparin Indication: NSTEMI, CABG Friday  Allergies  Allergen Reactions  . Ace Inhibitors Anaphylaxis  . Lisinopril Anaphylaxis  . Mushroom Extract Complex Swelling  . Tetanus Toxoids Nausea And Vomiting and Swelling    Patient Measurements: Height: 5\' 2"  (157.5 cm) Weight: 199 lb (90.266 kg) IBW/kg (Calculated) : 50.1 Heparin Dosing Weight: 70kg Vital Signs: Temp: 98.2 F (36.8 C) (01/07 0300) Temp Source: Oral (01/07 0300) BP: 114/76 mmHg (01/07 0400) Pulse Rate: 68 (01/07 0400)  Labs:  Recent Labs  12/17/14 1413 12/17/14 1808 12/17/14 2121  12/18/14 0120 12/18/14 0340 12/18/14 0743 12/19/14 0214 12/19/14 1155 12/19/14 1830 12/20/14 0307  HGB 12.5  --   --   --   --  11.5*  --  11.5*  --   --  11.1*  HCT 37.5  --   --   --   --  34.5*  --  33.9*  --   --  33.3*  PLT 212  --   --   --   --  228  --  209  --   --  199  LABPROT  --  15.1 14.3  --   --   --   --   --   --   --   --   INR  --  1.18 1.10  --   --   --   --   --   --   --   --   HEPARINUNFRC  --   --   --   < > 0.37  --  0.35 0.13* 0.55 0.62 0.45  CREATININE 1.49*  --   --   --   --  1.26*  --   --   --   --   --   TROPONINI  --   --  0.68*  --  1.62*  --  1.59*  --   --   --   --   < > = values in this interval not displayed.  Estimated Creatinine Clearance: 46.5 mL/min (by C-G formula based on Cr of 1.26).   Medications:  . heparin 1,100 Units/hr (12/19/14 2140)  . nitroGLYCERIN 5 mcg/min (12/19/14 2015)    Assessment: 63 yof continues on heparin for NSTEMI, s/p cath, awaiting CABG on Friday.  Heparin level is therapeutic on heparin at 1100 units/hr.  No bleeding or complications noted.  CBC stable.  HL remains therapeutic this AM\ at 0.45 on heparin 1100 units/hr. No bleeding noted.  Goal of Therapy:  Heparin level 0.3-0.7 units/ml Monitor platelets by anticoagulation protocol: Yes   Plan:  1) Continue IV heparin at  1100 units/hr. 2) AM heparin level and CBC. 3) F/u after CABG Friday.  Tad MooreJessica Urie Loughner, Pharm D, BCPS  Clinical Pharmacist Pager [phone]  12/20/2014 7:17 AM

## 2014-12-20 NOTE — Progress Notes (Signed)
Pt had sudden increase in CP with pain down right arm. Nitro was increased, but pt felt worsening pain. Stat EKG was complete and Cards fellow aware. Nitro increased again to 30 mcgs. Pt had sudden decrease in pain and resting comfortably. Will continue to monitor pt. No new orders placed at this time.

## 2014-12-21 ENCOUNTER — Inpatient Hospital Stay (HOSPITAL_COMMUNITY): Payer: Medicare Other | Admitting: Certified Registered Nurse Anesthetist

## 2014-12-21 ENCOUNTER — Encounter (HOSPITAL_COMMUNITY): Payer: Self-pay | Admitting: Certified Registered Nurse Anesthetist

## 2014-12-21 ENCOUNTER — Encounter (HOSPITAL_COMMUNITY)
Admission: EM | Disposition: A | Discharge status: Home / Self Care | Payer: Medicare Other | Source: Home / Self Care | Attending: Thoracic Surgery (Cardiothoracic Vascular Surgery)

## 2014-12-21 ENCOUNTER — Inpatient Hospital Stay (HOSPITAL_COMMUNITY): Payer: Medicare Other

## 2014-12-21 DIAGNOSIS — Z951 Presence of aortocoronary bypass graft: Secondary | ICD-10-CM

## 2014-12-21 DIAGNOSIS — I2510 Atherosclerotic heart disease of native coronary artery without angina pectoris: Secondary | ICD-10-CM

## 2014-12-21 HISTORY — PX: TEE WITHOUT CARDIOVERSION: SHX5443

## 2014-12-21 HISTORY — DX: Presence of aortocoronary bypass graft: Z95.1

## 2014-12-21 HISTORY — PX: CORONARY ARTERY BYPASS GRAFT: SHX141

## 2014-12-21 LAB — POCT I-STAT, CHEM 8
BUN: 13 mg/dL (ref 6–23)
BUN: 11 mg/dL (ref 6–23)
BUN: 11 mg/dL (ref 6–23)
BUN: 10 mg/dL (ref 6–23)
BUN: 11 mg/dL (ref 6–23)
BUN: 9 mg/dL (ref 6–23)
CALCIUM ION: 0.99 mmol/L — AB (ref 1.13–1.30)
CALCIUM ION: 1.23 mmol/L (ref 1.13–1.30)
CHLORIDE: 108 meq/L (ref 96–112)
CHLORIDE: 102 meq/L (ref 96–112)
CHLORIDE: 111 meq/L (ref 96–112)
CREATININE: 1.1 mg/dL (ref 0.50–1.10)
Calcium, Ion: 1.2 mmol/L (ref 1.13–1.30)
Calcium, Ion: 1.18 mmol/L (ref 1.13–1.30)
Calcium, Ion: 1.01 mmol/L — ABNORMAL LOW (ref 1.13–1.30)
Calcium, Ion: 1.04 mmol/L — ABNORMAL LOW (ref 1.13–1.30)
Chloride: 107 mEq/L (ref 96–112)
Chloride: 101 mEq/L (ref 96–112)
Chloride: 101 mEq/L (ref 96–112)
Creatinine, Ser: 1.3 mg/dL — ABNORMAL HIGH (ref 0.50–1.10)
Creatinine, Ser: 1.2 mg/dL — ABNORMAL HIGH (ref 0.50–1.10)
Creatinine, Ser: 1 mg/dL (ref 0.50–1.10)
Creatinine, Ser: 1.1 mg/dL (ref 0.50–1.10)
Creatinine, Ser: 1.3 mg/dL — ABNORMAL HIGH (ref 0.50–1.10)
GLUCOSE: 217 mg/dL — AB (ref 70–99)
GLUCOSE: 182 mg/dL — AB (ref 70–99)
GLUCOSE: 163 mg/dL — AB (ref 70–99)
Glucose, Bld: 223 mg/dL — ABNORMAL HIGH (ref 70–99)
Glucose, Bld: 176 mg/dL — ABNORMAL HIGH (ref 70–99)
Glucose, Bld: 196 mg/dL — ABNORMAL HIGH (ref 70–99)
HCT: 29 % — ABNORMAL LOW (ref 36.0–46.0)
HCT: 20 % — ABNORMAL LOW (ref 36.0–46.0)
HCT: 20 % — ABNORMAL LOW (ref 36.0–46.0)
HEMATOCRIT: 33 % — AB (ref 36.0–46.0)
HEMATOCRIT: 27 % — AB (ref 36.0–46.0)
HEMATOCRIT: 27 % — AB (ref 36.0–46.0)
HEMOGLOBIN: 11.2 g/dL — AB (ref 12.0–15.0)
Hemoglobin: 9.9 g/dL — ABNORMAL LOW (ref 12.0–15.0)
Hemoglobin: 6.8 g/dL — CL (ref 12.0–15.0)
Hemoglobin: 6.8 g/dL — CL (ref 12.0–15.0)
Hemoglobin: 9.2 g/dL — ABNORMAL LOW (ref 12.0–15.0)
Hemoglobin: 9.2 g/dL — ABNORMAL LOW (ref 12.0–15.0)
POTASSIUM: 4.8 mmol/L (ref 3.5–5.1)
POTASSIUM: 4.7 mmol/L (ref 3.5–5.1)
POTASSIUM: 3.9 mmol/L (ref 3.5–5.1)
Potassium: 4 mmol/L (ref 3.5–5.1)
Potassium: 3.7 mmol/L (ref 3.5–5.1)
Potassium: 5.1 mmol/L (ref 3.5–5.1)
SODIUM: 138 mmol/L (ref 135–145)
Sodium: 141 mmol/L (ref 135–145)
Sodium: 141 mmol/L (ref 135–145)
Sodium: 133 mmol/L — ABNORMAL LOW (ref 135–145)
Sodium: 136 mmol/L (ref 135–145)
Sodium: 142 mmol/L (ref 135–145)
TCO2: 19 mmol/L (ref 0–100)
TCO2: 18 mmol/L (ref 0–100)
TCO2: 18 mmol/L (ref 0–100)
TCO2: 19 mmol/L (ref 0–100)
TCO2: 22 mmol/L (ref 0–100)
TCO2: 19 mmol/L (ref 0–100)

## 2014-12-21 LAB — POCT I-STAT 3, VENOUS BLOOD GAS (G3P V)
Acid-base deficit: 7 mmol/L — ABNORMAL HIGH (ref 0.0–2.0)
BICARBONATE: 17.4 meq/L — AB (ref 20.0–24.0)
O2 Saturation: 76 %
Patient temperature: 34
TCO2: 18 mmol/L (ref 0–100)
pCO2, Ven: 24.8 mmHg — ABNORMAL LOW (ref 45.0–50.0)
pH, Ven: 7.442 — ABNORMAL HIGH (ref 7.250–7.300)
pO2, Ven: 32 mmHg (ref 30.0–45.0)

## 2014-12-21 LAB — POCT I-STAT 3, ART BLOOD GAS (G3+)
Acid-base deficit: 3 mmol/L — ABNORMAL HIGH (ref 0.0–2.0)
Acid-base deficit: 3 mmol/L — ABNORMAL HIGH (ref 0.0–2.0)
Acid-base deficit: 6 mmol/L — ABNORMAL HIGH (ref 0.0–2.0)
BICARBONATE: 19.8 meq/L — AB (ref 20.0–24.0)
BICARBONATE: 22.3 meq/L (ref 20.0–24.0)
Bicarbonate: 19.5 mEq/L — ABNORMAL LOW (ref 20.0–24.0)
O2 SAT: 94 %
O2 Saturation: 100 %
O2 Saturation: 100 %
PH ART: 7.527 — AB (ref 7.350–7.450)
Patient temperature: 34
Patient temperature: 37.2
Patient temperature: 35.6
TCO2: 21 mmol/L (ref 0–100)
TCO2: 23 mmol/L (ref 0–100)
TCO2: 21 mmol/L (ref 0–100)
pCO2 arterial: 23.2 mmHg — ABNORMAL LOW (ref 35.0–45.0)
pCO2 arterial: 38.3 mmHg (ref 35.0–45.0)
pCO2 arterial: 34 mmHg — ABNORMAL LOW (ref 35.0–45.0)
pH, Arterial: 7.374 (ref 7.350–7.450)
pH, Arterial: 7.36 (ref 7.350–7.450)
pO2, Arterial: 298 mmHg — ABNORMAL HIGH (ref 80.0–100.0)
pO2, Arterial: 240 mmHg — ABNORMAL HIGH (ref 80.0–100.0)
pO2, Arterial: 69 mmHg — ABNORMAL LOW (ref 80.0–100.0)

## 2014-12-21 LAB — CBC
HEMATOCRIT: 34.2 % — AB (ref 36.0–46.0)
HEMATOCRIT: 29.7 % — AB (ref 36.0–46.0)
HEMATOCRIT: 27.2 % — AB (ref 36.0–46.0)
Hemoglobin: 11.4 g/dL — ABNORMAL LOW (ref 12.0–15.0)
Hemoglobin: 10 g/dL — ABNORMAL LOW (ref 12.0–15.0)
Hemoglobin: 9.4 g/dL — ABNORMAL LOW (ref 12.0–15.0)
MCH: 31.4 pg (ref 26.0–34.0)
MCH: 31.3 pg (ref 26.0–34.0)
MCH: 32.5 pg (ref 26.0–34.0)
MCHC: 33.3 g/dL (ref 30.0–36.0)
MCHC: 33.7 g/dL (ref 30.0–36.0)
MCHC: 34.6 g/dL (ref 30.0–36.0)
MCV: 94.2 fL (ref 78.0–100.0)
MCV: 93.1 fL (ref 78.0–100.0)
MCV: 94.1 fL (ref 78.0–100.0)
PLATELETS: 124 10*3/uL — AB (ref 150–400)
Platelets: 198 10*3/uL (ref 150–400)
Platelets: 120 10*3/uL — ABNORMAL LOW (ref 150–400)
RBC: 3.63 MIL/uL — AB (ref 3.87–5.11)
RBC: 3.19 MIL/uL — AB (ref 3.87–5.11)
RBC: 2.89 MIL/uL — ABNORMAL LOW (ref 3.87–5.11)
RDW: 12.8 % (ref 11.5–15.5)
RDW: 12.8 % (ref 11.5–15.5)
RDW: 13 % (ref 11.5–15.5)
WBC: 11.7 10*3/uL — ABNORMAL HIGH (ref 4.0–10.5)
WBC: 13.5 10*3/uL — ABNORMAL HIGH (ref 4.0–10.5)
WBC: 10.5 10*3/uL (ref 4.0–10.5)

## 2014-12-21 LAB — APTT
aPTT: 49 seconds — ABNORMAL HIGH (ref 24–37)
aPTT: 90 seconds — ABNORMAL HIGH (ref 24–37)

## 2014-12-21 LAB — TYPE AND SCREEN
ABO/RH(D): O POS
ANTIBODY SCREEN: NEGATIVE

## 2014-12-21 LAB — COMPREHENSIVE METABOLIC PANEL
ALT: 42 U/L — AB (ref 0–35)
AST: 65 U/L — AB (ref 0–37)
Albumin: 3.4 g/dL — ABNORMAL LOW (ref 3.5–5.2)
Alkaline Phosphatase: 77 U/L (ref 39–117)
Anion gap: 11 (ref 5–15)
BILIRUBIN TOTAL: 0.4 mg/dL (ref 0.3–1.2)
BUN: 15 mg/dL (ref 6–23)
CHLORIDE: 111 meq/L (ref 96–112)
CO2: 17 mmol/L — AB (ref 19–32)
Calcium: 8.4 mg/dL (ref 8.4–10.5)
Creatinine, Ser: 1.34 mg/dL — ABNORMAL HIGH (ref 0.50–1.10)
GFR calc Af Amer: 47 mL/min — ABNORMAL LOW (ref >90)
GFR, EST NON AFRICAN AMERICAN: 41 mL/min — AB (ref >90)
GLUCOSE: 201 mg/dL — AB (ref 70–99)
Potassium: 3.9 mmol/L (ref 3.5–5.1)
Sodium: 139 mmol/L (ref 135–145)
Total Protein: 6.3 g/dL (ref 6.0–8.3)

## 2014-12-21 LAB — HEMOGLOBIN AND HEMATOCRIT, BLOOD
HCT: 19.4 % — ABNORMAL LOW (ref 36.0–46.0)
Hemoglobin: 6.6 g/dL — CL (ref 12.0–15.0)

## 2014-12-21 LAB — GLUCOSE, CAPILLARY: Glucose-Capillary: 203 mg/dL — ABNORMAL HIGH (ref 70–99)

## 2014-12-21 LAB — POCT I-STAT 4, (NA,K, GLUC, HGB,HCT)
GLUCOSE: 175 mg/dL — AB (ref 70–99)
HCT: 31 % — ABNORMAL LOW (ref 36.0–46.0)
Hemoglobin: 10.5 g/dL — ABNORMAL LOW (ref 12.0–15.0)
Potassium: 3.9 mmol/L (ref 3.5–5.1)
SODIUM: 141 mmol/L (ref 135–145)

## 2014-12-21 LAB — CREATININE, SERUM
Creatinine, Ser: 1.29 mg/dL — ABNORMAL HIGH (ref 0.50–1.10)
GFR, EST AFRICAN AMERICAN: 49 mL/min — AB (ref >90)
GFR, EST NON AFRICAN AMERICAN: 42 mL/min — AB (ref >90)

## 2014-12-21 LAB — PROTIME-INR
INR: 1.4 (ref 0.00–1.49)
Prothrombin Time: 17.3 seconds — ABNORMAL HIGH (ref 11.6–15.2)

## 2014-12-21 LAB — SURGICAL PCR SCREEN
MRSA, PCR: NEGATIVE
STAPHYLOCOCCUS AUREUS: NEGATIVE

## 2014-12-21 LAB — ABO/RH: ABO/RH(D): O POS

## 2014-12-21 LAB — MAGNESIUM: Magnesium: 3.1 mg/dL — ABNORMAL HIGH (ref 1.5–2.5)

## 2014-12-21 LAB — PLATELET COUNT: Platelets: 115 10*3/uL — ABNORMAL LOW (ref 150–400)

## 2014-12-21 LAB — HEPARIN LEVEL (UNFRACTIONATED): Heparin Unfractionated: 0.52 IU/mL (ref 0.30–0.70)

## 2014-12-21 SURGERY — CORONARY ARTERY BYPASS GRAFTING (CABG)
Anesthesia: General | Site: Chest

## 2014-12-21 MED ORDER — ARTIFICIAL TEARS OP OINT
TOPICAL_OINTMENT | OPHTHALMIC | Status: AC
  Filled 2014-12-21: qty 3.5

## 2014-12-21 MED ORDER — 0.9 % SODIUM CHLORIDE (POUR BTL) OPTIME
TOPICAL | Status: DC | PRN
  Administered 2014-12-21: 6000 mL

## 2014-12-21 MED ORDER — VECURONIUM BROMIDE 10 MG IV SOLR
INTRAVENOUS | Status: AC
  Filled 2014-12-21: qty 10

## 2014-12-21 MED ORDER — FENTANYL CITRATE 0.05 MG/ML IJ SOLN
INTRAMUSCULAR | Status: AC
  Filled 2014-12-21: qty 5

## 2014-12-21 MED ORDER — DEXMEDETOMIDINE HCL IN NACL 200 MCG/50ML IV SOLN: 0.0000 ug/kg/h | INTRAVENOUS | Status: DC

## 2014-12-21 MED ORDER — METOPROLOL TARTRATE 12.5 MG HALF TABLET
12.5000 mg | ORAL_TABLET | Freq: Two times a day (BID) | ORAL | Status: DC
  Administered 2014-12-22 – 2014-12-23 (×3): 12.5 mg via ORAL
  Filled 2014-12-21 (×7): qty 1

## 2014-12-21 MED ORDER — NITROGLYCERIN IN D5W 200-5 MCG/ML-% IV SOLN: 0.0000 ug/min | INTRAVENOUS | Status: DC

## 2014-12-21 MED ORDER — ALBUMIN HUMAN 5 % IV SOLN
250.0000 mL | INTRAVENOUS | Status: AC | PRN
  Administered 2014-12-21 (×4): 250 mL via INTRAVENOUS
  Filled 2014-12-21 (×2): qty 250

## 2014-12-21 MED ORDER — PROTAMINE SULFATE 10 MG/ML IV SOLN
INTRAVENOUS | Status: AC
  Filled 2014-12-21: qty 25

## 2014-12-21 MED ORDER — PANTOPRAZOLE SODIUM 40 MG PO TBEC
40.0000 mg | DELAYED_RELEASE_TABLET | Freq: Every day | ORAL | Status: DC
  Administered 2014-12-23 – 2014-12-27 (×5): 40 mg via ORAL
  Filled 2014-12-21 (×4): qty 1

## 2014-12-21 MED ORDER — HEPARIN SODIUM (PORCINE) 1000 UNIT/ML IJ SOLN
INTRAMUSCULAR | Status: DC | PRN
  Administered 2014-12-21: 5000 [IU] via INTRAVENOUS
  Administered 2014-12-21: 2000 [IU] via INTRAVENOUS
  Administered 2014-12-21 (×5): 5000 [IU] via INTRAVENOUS

## 2014-12-21 MED ORDER — ALBUMIN HUMAN 5 % IV SOLN: 12.5000 g | Freq: Once | INTRAVENOUS | Status: AC | PRN

## 2014-12-21 MED ORDER — OXYCODONE HCL 5 MG PO TABS
5.0000 mg | ORAL_TABLET | ORAL | Status: DC | PRN
  Administered 2014-12-22 (×2): 5 mg via ORAL
  Administered 2014-12-22 – 2014-12-26 (×2): 10 mg via ORAL
  Filled 2014-12-21: qty 1
  Filled 2014-12-21 (×2): qty 2
  Filled 2014-12-21: qty 1

## 2014-12-21 MED ORDER — ACETAMINOPHEN 160 MG/5ML PO SOLN
1000.0000 mg | Freq: Four times a day (QID) | ORAL | Status: AC
  Filled 2014-12-21: qty 40

## 2014-12-21 MED ORDER — BISACODYL 5 MG PO TBEC
10.0000 mg | DELAYED_RELEASE_TABLET | Freq: Every day | ORAL | Status: DC
  Administered 2014-12-22 – 2014-12-25 (×3): 10 mg via ORAL
  Filled 2014-12-21 (×4): qty 2

## 2014-12-21 MED ORDER — DOPAMINE-DEXTROSE 3.2-5 MG/ML-% IV SOLN: 3.0000 ug/kg/min | INTRAVENOUS | Status: DC

## 2014-12-21 MED ORDER — TRAMADOL HCL 50 MG PO TABS: 50.0000 mg | ORAL_TABLET | ORAL | Status: DC | PRN

## 2014-12-21 MED ORDER — SODIUM BICARBONATE 8.4 % IV SOLN
50.0000 meq | Freq: Once | INTRAVENOUS | Status: AC
  Administered 2014-12-21: 50 meq via INTRAVENOUS

## 2014-12-21 MED ORDER — LIDOCAINE HCL (CARDIAC) 20 MG/ML IV SOLN
INTRAVENOUS | Status: AC
  Filled 2014-12-21: qty 5

## 2014-12-21 MED ORDER — VANCOMYCIN HCL IN DEXTROSE 1-5 GM/200ML-% IV SOLN
1000.0000 mg | Freq: Once | INTRAVENOUS | Status: AC
  Administered 2014-12-21: 1000 mg via INTRAVENOUS
  Filled 2014-12-21: qty 200

## 2014-12-21 MED ORDER — ALBUMIN HUMAN 5 % IV SOLN
INTRAVENOUS | Status: DC | PRN
  Administered 2014-12-21 (×2): via INTRAVENOUS

## 2014-12-21 MED ORDER — CALCIUM CHLORIDE 10 % IV SOLN
1.0000 g | Freq: Once | INTRAVENOUS | Status: DC
  Administered 2014-12-21: 1 g via INTRAVENOUS

## 2014-12-21 MED ORDER — METOPROLOL TARTRATE 25 MG/10 ML ORAL SUSPENSION
12.5000 mg | Freq: Two times a day (BID) | ORAL | Status: DC
  Filled 2014-12-21 (×7): qty 5

## 2014-12-21 MED ORDER — SODIUM CHLORIDE 0.9 % IJ SOLN
INTRAMUSCULAR | Status: DC | PRN
  Administered 2014-12-21 (×3): via TOPICAL

## 2014-12-21 MED ORDER — LACTATED RINGERS IV SOLN: 500.0000 mL | Freq: Once | INTRAVENOUS | Status: AC | PRN

## 2014-12-21 MED ORDER — MIDAZOLAM HCL 5 MG/5ML IJ SOLN
INTRAMUSCULAR | Status: DC | PRN
  Administered 2014-12-21 (×2): 3 mg via INTRAVENOUS
  Administered 2014-12-21 (×2): 2 mg via INTRAVENOUS

## 2014-12-21 MED ORDER — SODIUM CHLORIDE 0.9 % IV SOLN
1.0000 g | Freq: Once | INTRAVENOUS | Status: DC
  Filled 2014-12-21: qty 10

## 2014-12-21 MED ORDER — PROPOFOL 10 MG/ML IV BOLUS
INTRAVENOUS | Status: DC | PRN
  Administered 2014-12-21: 110 mg via INTRAVENOUS

## 2014-12-21 MED ORDER — MAGNESIUM SULFATE 4 GM/100ML IV SOLN
4.0000 g | Freq: Once | INTRAVENOUS | Status: AC
Start: 2014-12-21 — End: 2014-12-21
  Administered 2014-12-21: 4 g via INTRAVENOUS
  Filled 2014-12-21: qty 100

## 2014-12-21 MED ORDER — HEMOSTATIC AGENTS (NO CHARGE) OPTIME
TOPICAL | Status: DC | PRN
  Administered 2014-12-21: 1 via TOPICAL

## 2014-12-21 MED ORDER — BISACODYL 10 MG RE SUPP: 10.0000 mg | Freq: Every day | RECTAL | Status: DC

## 2014-12-21 MED ORDER — LACTATED RINGERS IV SOLN
INTRAVENOUS | Status: DC | PRN
  Administered 2014-12-21 (×4): via INTRAVENOUS

## 2014-12-21 MED ORDER — PROTAMINE SULFATE 10 MG/ML IV SOLN
INTRAVENOUS | Status: AC
  Filled 2014-12-21: qty 5

## 2014-12-21 MED ORDER — MIDAZOLAM HCL 10 MG/2ML IJ SOLN
INTRAMUSCULAR | Status: AC
  Filled 2014-12-21: qty 2

## 2014-12-21 MED ORDER — STERILE WATER FOR INJECTION IJ SOLN
INTRAMUSCULAR | Status: AC
  Filled 2014-12-21: qty 10

## 2014-12-21 MED ORDER — SODIUM CHLORIDE 0.9 % IJ SOLN: 3.0000 mL | INTRAMUSCULAR | Status: DC | PRN

## 2014-12-21 MED ORDER — ROCURONIUM BROMIDE 100 MG/10ML IV SOLN
INTRAVENOUS | Status: DC | PRN
  Administered 2014-12-21: 50 mg via INTRAVENOUS

## 2014-12-21 MED ORDER — PROTAMINE SULFATE 10 MG/ML IV SOLN
INTRAVENOUS | Status: DC | PRN
  Administered 2014-12-21: 10 mg via INTRAVENOUS
  Administered 2014-12-21 (×6): 50 mg via INTRAVENOUS

## 2014-12-21 MED ORDER — DEXTROSE 5 % IV SOLN
1.5000 g | Freq: Two times a day (BID) | INTRAVENOUS | Status: AC
  Administered 2014-12-21 – 2014-12-23 (×4): 1.5 g via INTRAVENOUS
  Filled 2014-12-21 (×4): qty 1.5

## 2014-12-21 MED ORDER — SODIUM CHLORIDE 0.9 % IV SOLN
INTRAVENOUS | Status: DC | PRN
  Administered 2014-12-21: 11:00:00 via INTRAVENOUS

## 2014-12-21 MED ORDER — DOCUSATE SODIUM 100 MG PO CAPS
200.0000 mg | ORAL_CAPSULE | Freq: Every day | ORAL | Status: DC
  Administered 2014-12-22 – 2014-12-27 (×5): 200 mg via ORAL
  Filled 2014-12-21 (×6): qty 2

## 2014-12-21 MED ORDER — ACETAMINOPHEN 500 MG PO TABS
1000.0000 mg | ORAL_TABLET | Freq: Four times a day (QID) | ORAL | Status: AC
  Administered 2014-12-22 – 2014-12-26 (×19): 1000 mg via ORAL
  Filled 2014-12-21 (×20): qty 2

## 2014-12-21 MED ORDER — SODIUM CHLORIDE 0.9 % IV SOLN
INTRAVENOUS | Status: DC
  Administered 2014-12-21: 12:00:00 via INTRAVENOUS

## 2014-12-21 MED ORDER — SODIUM CHLORIDE 0.45 % IV SOLN
INTRAVENOUS | Status: DC
  Administered 2014-12-21: 13:00:00 via INTRAVENOUS

## 2014-12-21 MED ORDER — ASPIRIN 81 MG PO CHEW
324.0000 mg | CHEWABLE_TABLET | Freq: Every day | ORAL | Status: DC
  Administered 2014-12-27: 324 mg
  Filled 2014-12-21 (×2): qty 4

## 2014-12-21 MED ORDER — MIDAZOLAM HCL 2 MG/2ML IJ SOLN: 2.0000 mg | INTRAMUSCULAR | Status: DC | PRN

## 2014-12-21 MED ORDER — METOCLOPRAMIDE HCL 5 MG/ML IJ SOLN
10.0000 mg | Freq: Four times a day (QID) | INTRAMUSCULAR | Status: DC
  Administered 2014-12-21 – 2014-12-24 (×11): 10 mg via INTRAVENOUS
  Filled 2014-12-21 (×16): qty 2

## 2014-12-21 MED ORDER — POTASSIUM CHLORIDE 10 MEQ/50ML IV SOLN
10.0000 meq | INTRAVENOUS | Status: AC
  Administered 2014-12-21 (×3): 10 meq via INTRAVENOUS

## 2014-12-21 MED ORDER — PHENYLEPHRINE HCL 10 MG/ML IJ SOLN
10.0000 mg | INTRAMUSCULAR | Status: DC | PRN
  Administered 2014-12-21: 25 ug/min via INTRAVENOUS

## 2014-12-21 MED ORDER — FAMOTIDINE IN NACL 20-0.9 MG/50ML-% IV SOLN
20.0000 mg | Freq: Two times a day (BID) | INTRAVENOUS | Status: AC
  Administered 2014-12-21: 20 mg via INTRAVENOUS

## 2014-12-21 MED ORDER — LACTATED RINGERS IV SOLN: INTRAVENOUS | Status: DC

## 2014-12-21 MED ORDER — MORPHINE SULFATE 2 MG/ML IJ SOLN
1.0000 mg | INTRAMUSCULAR | Status: AC | PRN
  Administered 2014-12-21: 2 mg via INTRAVENOUS

## 2014-12-21 MED ORDER — SODIUM CHLORIDE 0.9 % IV SOLN: INTRAVENOUS | Status: DC

## 2014-12-21 MED ORDER — ACETAMINOPHEN 650 MG RE SUPP
650.0000 mg | Freq: Once | RECTAL | Status: AC
  Administered 2014-12-21: 650 mg via RECTAL

## 2014-12-21 MED ORDER — PROPOFOL 10 MG/ML IV BOLUS
INTRAVENOUS | Status: AC
  Filled 2014-12-21: qty 20

## 2014-12-21 MED ORDER — ASPIRIN EC 325 MG PO TBEC
325.0000 mg | DELAYED_RELEASE_TABLET | Freq: Every day | ORAL | Status: DC
  Administered 2014-12-22 – 2014-12-26 (×5): 325 mg via ORAL
  Filled 2014-12-21 (×6): qty 1

## 2014-12-21 MED ORDER — PHENYLEPHRINE HCL 10 MG/ML IJ SOLN
0.0000 ug/min | INTRAVENOUS | Status: DC
  Filled 2014-12-21: qty 2

## 2014-12-21 MED ORDER — HEPARIN SODIUM (PORCINE) 1000 UNIT/ML IJ SOLN
INTRAMUSCULAR | Status: AC
  Filled 2014-12-21: qty 1

## 2014-12-21 MED ORDER — VECURONIUM BROMIDE 10 MG IV SOLR
INTRAVENOUS | Status: DC | PRN
  Administered 2014-12-21 (×3): 5 mg via INTRAVENOUS

## 2014-12-21 MED ORDER — MORPHINE SULFATE 2 MG/ML IJ SOLN
2.0000 mg | INTRAMUSCULAR | Status: DC | PRN
  Administered 2014-12-22 (×2): 1 mg via INTRAVENOUS
  Filled 2014-12-21 (×2): qty 1

## 2014-12-21 MED ORDER — CALCIUM CHLORIDE 10 % IV SOLN
1.0000 g | Freq: Once | INTRAVENOUS | Status: AC
  Filled 2014-12-21: qty 10

## 2014-12-21 MED ORDER — ACETAMINOPHEN 160 MG/5ML PO SOLN: 650.0000 mg | Freq: Once | ORAL | Status: AC

## 2014-12-21 MED ORDER — FENTANYL CITRATE 0.05 MG/ML IJ SOLN
INTRAMUSCULAR | Status: DC | PRN
  Administered 2014-12-21: 100 ug via INTRAVENOUS
  Administered 2014-12-21: 250 ug via INTRAVENOUS
  Administered 2014-12-21: 100 ug via INTRAVENOUS
  Administered 2014-12-21: 50 ug via INTRAVENOUS
  Administered 2014-12-21 (×3): 150 ug via INTRAVENOUS
  Administered 2014-12-21 (×4): 100 ug via INTRAVENOUS
  Administered 2014-12-21: 150 ug via INTRAVENOUS

## 2014-12-21 MED ORDER — SODIUM CHLORIDE 0.9 % IV SOLN: 250.0000 mL | INTRAVENOUS | Status: DC

## 2014-12-21 MED ORDER — SODIUM CHLORIDE 0.9 % IV SOLN
INTRAVENOUS | Status: DC
  Administered 2014-12-21: 21:00:00 via INTRAVENOUS
  Filled 2014-12-21 (×2): qty 2.5

## 2014-12-21 MED ORDER — INSULIN REGULAR BOLUS VIA INFUSION
0.0000 [IU] | Freq: Three times a day (TID) | INTRAVENOUS | Status: DC
  Filled 2014-12-21: qty 10

## 2014-12-21 MED ORDER — ROCURONIUM BROMIDE 50 MG/5ML IV SOLN
INTRAVENOUS | Status: AC
  Filled 2014-12-21: qty 1

## 2014-12-21 MED ORDER — SODIUM CHLORIDE 0.9 % IJ SOLN
3.0000 mL | Freq: Two times a day (BID) | INTRAMUSCULAR | Status: DC
  Administered 2014-12-22 – 2014-12-23 (×3): 3 mL via INTRAVENOUS

## 2014-12-21 MED ORDER — METOPROLOL TARTRATE 1 MG/ML IV SOLN: 2.5000 mg | INTRAVENOUS | Status: DC | PRN

## 2014-12-21 MED ORDER — ONDANSETRON HCL 4 MG/2ML IJ SOLN
4.0000 mg | Freq: Four times a day (QID) | INTRAMUSCULAR | Status: DC | PRN
  Administered 2014-12-22: 4 mg via INTRAVENOUS
  Filled 2014-12-21: qty 2

## 2014-12-21 MED FILL — Electrolyte-R (PH 7.4) Solution: INTRAVENOUS | Qty: 3000 | Status: AC

## 2014-12-21 MED FILL — Heparin Sodium (Porcine) Inj 1000 Unit/ML: INTRAMUSCULAR | Qty: 10 | Status: AC

## 2014-12-21 MED FILL — Lidocaine HCl IV Inj 20 MG/ML: INTRAVENOUS | Qty: 5 | Status: AC

## 2014-12-21 MED FILL — Heparin Sodium (Porcine) Inj 1000 Unit/ML: INTRAMUSCULAR | Qty: 30 | Status: AC

## 2014-12-21 MED FILL — Sodium Bicarbonate IV Soln 8.4%: INTRAVENOUS | Qty: 50 | Status: AC

## 2014-12-21 MED FILL — Sodium Chloride IV Soln 0.9%: INTRAVENOUS | Qty: 1000 | Status: AC

## 2014-12-21 MED FILL — Mannitol IV Soln 20%: INTRAVENOUS | Qty: 500 | Status: AC

## 2014-12-21 SURGICAL SUPPLY — 91 items
ATTRACTOMAT 16X20 MAGNETIC DRP (DRAPES) IMPLANT
BAG DECANTER FOR FLEXI CONT (MISCELLANEOUS) ×4 IMPLANT
BANDAGE ELASTIC 4 VELCRO ST LF (GAUZE/BANDAGES/DRESSINGS) ×4 IMPLANT
BANDAGE ELASTIC 6 VELCRO ST LF (GAUZE/BANDAGES/DRESSINGS) ×4 IMPLANT
BASKET HEART  (ORDER IN 25'S) (MISCELLANEOUS) ×1
BASKET HEART (ORDER IN 25'S) (MISCELLANEOUS) ×1
BASKET HEART (ORDER IN 25S) (MISCELLANEOUS) ×2 IMPLANT
BLADE STERNUM SYSTEM 6 (BLADE) ×4 IMPLANT
BLADE SURG 11 STRL SS (BLADE) ×4 IMPLANT
BNDG GAUZE ELAST 4 BULKY (GAUZE/BANDAGES/DRESSINGS) ×4 IMPLANT
CANISTER SUCTION 2500CC (MISCELLANEOUS) ×4 IMPLANT
CANNULA EZ GLIDE AORTIC 21FR (CANNULA) ×4 IMPLANT
CARDIAC SUCTION (MISCELLANEOUS) ×4 IMPLANT
CATH CPB KIT HENDRICKSON (MISCELLANEOUS) ×4 IMPLANT
CATH ROBINSON RED A/P 18FR (CATHETERS) ×4 IMPLANT
CATH THORACIC 36FR (CATHETERS) ×4 IMPLANT
CATH THORACIC 36FR RT ANG (CATHETERS) ×4 IMPLANT
CLIP TI MEDIUM 24 (CLIP) IMPLANT
CLIP TI WIDE RED SMALL 24 (CLIP) ×20 IMPLANT
COVER SURGICAL LIGHT HANDLE (MISCELLANEOUS) IMPLANT
CRADLE DONUT ADULT HEAD (MISCELLANEOUS) ×4 IMPLANT
DERMABOND ADHESIVE PROPEN (GAUZE/BANDAGES/DRESSINGS) ×2
DERMABOND ADVANCED .7 DNX6 (GAUZE/BANDAGES/DRESSINGS) ×2 IMPLANT
DRAPE CARDIOVASCULAR INCISE (DRAPES) ×2
DRAPE SLUSH/WARMER DISC (DRAPES) ×4 IMPLANT
DRAPE SRG 135X102X78XABS (DRAPES) ×2 IMPLANT
DRSG COVADERM 4X14 (GAUZE/BANDAGES/DRESSINGS) ×4 IMPLANT
ELECT REM PT RETURN 9FT ADLT (ELECTROSURGICAL) ×8
ELECTRODE REM PT RTRN 9FT ADLT (ELECTROSURGICAL) ×4 IMPLANT
GAUZE SPONGE 4X4 12PLY STRL (GAUZE/BANDAGES/DRESSINGS) ×8 IMPLANT
GLOVE BIO SURGEON STRL SZ 6 (GLOVE) ×16 IMPLANT
GLOVE BIO SURGEON STRL SZ 6.5 (GLOVE) ×12 IMPLANT
GLOVE BIO SURGEON STRL SZ7 (GLOVE) ×4 IMPLANT
GLOVE BIO SURGEONS STRL SZ 6.5 (GLOVE) ×4
GLOVE BIOGEL PI IND STRL 9 (GLOVE) ×2 IMPLANT
GLOVE BIOGEL PI INDICATOR 9 (GLOVE) ×2
GLOVE SURG SIGNA 7.5 PF LTX (GLOVE) ×12 IMPLANT
GOWN STRL REUS W/ TWL LRG LVL3 (GOWN DISPOSABLE) ×12 IMPLANT
GOWN STRL REUS W/ TWL XL LVL3 (GOWN DISPOSABLE) ×4 IMPLANT
GOWN STRL REUS W/TWL LRG LVL3 (GOWN DISPOSABLE) ×12
GOWN STRL REUS W/TWL XL LVL3 (GOWN DISPOSABLE) ×4
HEMOSTAT POWDER SURGIFOAM 1G (HEMOSTASIS) ×12 IMPLANT
HEMOSTAT SURGICEL 2X14 (HEMOSTASIS) ×4 IMPLANT
INSERT FOGARTY XLG (MISCELLANEOUS) IMPLANT
IV CATH 22GX1 FEP (IV SOLUTION) ×4 IMPLANT
KIT BASIN OR (CUSTOM PROCEDURE TRAY) ×4 IMPLANT
KIT ROOM TURNOVER OR (KITS) ×4 IMPLANT
KIT SUCTION CATH 14FR (SUCTIONS) ×8 IMPLANT
KIT VASOVIEW W/TROCAR VH 2000 (KITS) ×4 IMPLANT
MARKER GRAFT CORONARY BYPASS (MISCELLANEOUS) ×12 IMPLANT
NS IRRIG 1000ML POUR BTL (IV SOLUTION) ×20 IMPLANT
PACK OPEN HEART (CUSTOM PROCEDURE TRAY) ×4 IMPLANT
PAD ARMBOARD 7.5X6 YLW CONV (MISCELLANEOUS) ×8 IMPLANT
PAD ELECT DEFIB RADIOL ZOLL (MISCELLANEOUS) ×4 IMPLANT
PENCIL BUTTON HOLSTER BLD 10FT (ELECTRODE) ×4 IMPLANT
PUNCH AORTIC ROTATE 4.0MM (MISCELLANEOUS) IMPLANT
PUNCH AORTIC ROTATE 4.5MM 8IN (MISCELLANEOUS) ×4 IMPLANT
PUNCH AORTIC ROTATE 5MM 8IN (MISCELLANEOUS) IMPLANT
SET CARDIOPLEGIA MPS 5001102 (MISCELLANEOUS) ×4 IMPLANT
SPONGE GAUZE 4X4 12PLY STER LF (GAUZE/BANDAGES/DRESSINGS) ×8 IMPLANT
SUT BONE WAX W31G (SUTURE) ×4 IMPLANT
SUT MNCRL AB 4-0 PS2 18 (SUTURE) ×4 IMPLANT
SUT PROLENE 3 0 SH DA (SUTURE) ×4 IMPLANT
SUT PROLENE 4 0 RB 1 (SUTURE) ×4
SUT PROLENE 4 0 SH DA (SUTURE) IMPLANT
SUT PROLENE 4-0 RB1 .5 CRCL 36 (SUTURE) ×4 IMPLANT
SUT PROLENE 6 0 C 1 30 (SUTURE) ×8 IMPLANT
SUT PROLENE 7 0 BV1 MDA (SUTURE) ×4 IMPLANT
SUT PROLENE 8 0 BV175 6 (SUTURE) IMPLANT
SUT STEEL 6MS V (SUTURE) ×4 IMPLANT
SUT STEEL STERNAL CCS#1 18IN (SUTURE) IMPLANT
SUT STEEL SZ 6 DBL 3X14 BALL (SUTURE) ×12 IMPLANT
SUT VIC AB 1 CTX 36 (SUTURE) ×4
SUT VIC AB 1 CTX36XBRD ANBCTR (SUTURE) ×4 IMPLANT
SUT VIC AB 2-0 CT1 27 (SUTURE) ×2
SUT VIC AB 2-0 CT1 TAPERPNT 27 (SUTURE) ×2 IMPLANT
SUT VIC AB 2-0 CTX 27 (SUTURE) IMPLANT
SUT VIC AB 3-0 SH 27 (SUTURE)
SUT VIC AB 3-0 SH 27X BRD (SUTURE) IMPLANT
SUT VIC AB 3-0 X1 27 (SUTURE) IMPLANT
SUT VICRYL 4-0 PS2 18IN ABS (SUTURE) IMPLANT
SUTURE E-PAK OPEN HEART (SUTURE) ×4 IMPLANT
SYSTEM SAHARA CHEST DRAIN ATS (WOUND CARE) ×4 IMPLANT
TAPE CLOTH SURG 4X10 WHT LF (GAUZE/BANDAGES/DRESSINGS) ×8 IMPLANT
TOWEL OR 17X24 6PK STRL BLUE (TOWEL DISPOSABLE) ×8 IMPLANT
TOWEL OR 17X26 10 PK STRL BLUE (TOWEL DISPOSABLE) ×8 IMPLANT
TRAY FOLEY IC TEMP SENS 16FR (CATHETERS) ×4 IMPLANT
TUBE FEEDING 8FR 16IN STR KANG (MISCELLANEOUS) ×4 IMPLANT
TUBING INSUFFLATION (TUBING) ×4 IMPLANT
UNDERPAD 30X30 INCONTINENT (UNDERPADS AND DIAPERS) ×4 IMPLANT
WATER STERILE IRR 1000ML POUR (IV SOLUTION) ×8 IMPLANT

## 2014-12-21 NOTE — Progress Notes (Signed)
Echocardiogram Echocardiogram Transesophageal has been performed.  Dorothey BasemanReel, Skyanne Welle M 12/21/2014, 8:34 AM

## 2014-12-21 NOTE — Anesthesia Preprocedure Evaluation (Signed)
Anesthesia Evaluation  Patient identified by MRN, date of birth, ID band Patient awake    Reviewed: Allergy & Precautions, NPO status , Patient's Chart, lab work & pertinent test results  Airway Mallampati: II   Neck ROM: full    Dental   Pulmonary asthma ,          Cardiovascular hypertension, + angina + CAD and + Past MI     Neuro/Psych    GI/Hepatic   Endo/Other  diabetes, Type 2obese  Renal/GU Renal InsufficiencyRenal disease     Musculoskeletal   Abdominal   Peds  Hematology   Anesthesia Other Findings   Reproductive/Obstetrics                             Anesthesia Physical Anesthesia Plan  ASA: III  Anesthesia Plan: General   Post-op Pain Management:    Induction: Intravenous  Airway Management Planned: Oral ETT  Additional Equipment: Arterial line, CVP, PA Cath, TEE and Ultrasound Guidance Line Placement  Intra-op Plan:   Post-operative Plan: Post-operative intubation/ventilation  Informed Consent: I have reviewed the patients History and Physical, chart, labs and discussed the procedure including the risks, benefits and alternatives for the proposed anesthesia with the patient or authorized representative who has indicated his/her understanding and acceptance.     Plan Discussed with: CRNA, Anesthesiologist and Surgeon  Anesthesia Plan Comments:         Anesthesia Quick Evaluation

## 2014-12-21 NOTE — OR Nursing (Signed)
1103 first call made to SICU, 1130 second call made to SICU

## 2014-12-21 NOTE — Anesthesia Postprocedure Evaluation (Signed)
  Anesthesia Post-op Note  Patient: Cheryl Gill  Procedure(s) Performed: Procedure(s): CORONARY ARTERY BYPASS GRAFTING (CABG) TIMES TWO USING LEFT INTERNAL MAMMARY AND RIGHT SAPHENOUS LEG VEIN HARVESTED ENDOSCOPICALLY (N/A) TRANSESOPHAGEAL ECHOCARDIOGRAM (TEE) (N/A)  Patient Location: ICU  Anesthesia Type:General  Level of Consciousness: sedated  Airway and Oxygen Therapy: Patient remains intubated per anesthesia plan  Post-op Pain: none  Post-op Assessment: Post-op Vital signs reviewed, Patient's Cardiovascular Status Stable and Respiratory Function Stable  Post-op Vital Signs: Reviewed and stable  Last Vitals:  Filed Vitals:   12/21/14 1600  BP: 109/71  Pulse: 90  Temp: 37.1 C  Resp: 13    Complications: No apparent anesthesia complications

## 2014-12-21 NOTE — Progress Notes (Signed)
Patient ID: Cheryl Gill, female   DOB: [birth year], 64 y.o.   MRN: [id number]   SICU Evening Rounds:   Hemodynamically stable  CI = 1.59  Has started to wake up on vent. Moves all extremities to command   Urine output good  CT output low  CBC    Component Value Date/Time   WBC 13.5* 12/21/2014 1200   RBC 3.19* 12/21/2014 1200   HGB 10.5* 12/21/2014 1227   HCT 31.0* 12/21/2014 1227   PLT 120* 12/21/2014 1200   MCV 93.1 12/21/2014 1200   MCH 31.3 12/21/2014 1200   MCHC 33.7 12/21/2014 1200   RDW 12.8 12/21/2014 1200   LYMPHSABS 2.1 01/28/2014 0337   MONOABS 1.4* 01/28/2014 0337   EOSABS 0.1 01/28/2014 0337   BASOSABS 0.0 01/28/2014 0337     BMET    Component Value Date/Time   NA 141 12/21/2014 1227   K 3.9 12/21/2014 1227   CL 102 12/21/2014 1118   CO2 17* 12/21/2014 0001   GLUCOSE 175* 12/21/2014 1227   BUN 11 12/21/2014 1118   CREATININE 1.10 12/21/2014 1118   CALCIUM 8.4 12/21/2014 0001   GFRNONAA 41* 12/21/2014 0001   GFRAA 47* 12/21/2014 0001     A/P:  Stable postop course. Continue current plans. Extubate when ready.

## 2014-12-21 NOTE — Procedures (Signed)
Extubation Procedure Note  Patient Details:   Name: Cheryl Gill DOB: 05-May-1951 MRN: [id number]   Evaluation  O2 sats: stable throughout Complications: No apparent complications Patient did tolerate procedure well. Suctioning: Airway  NIF -28, FVC .55L. Pt able to breath around cuff. Pt extubated to 3L N/C with no complications, no stridor. Pt oriented to person and place.  Fredrich BirksMarshburn, Cheryl Gill 12/21/2014, 9:29 PM

## 2014-12-21 NOTE — Interval H&P Note (Signed)
History and Physical Interval Note:  12/21/2014 7:21 AM  Cheryl HasteGlenda Mcclintic  has presented today for surgery, with the diagnosis of CAD  The various methods of treatment have been discussed with the patient and family. After consideration of risks, benefits and other options for treatment, the patient has consented to  Procedure(s): CORONARY ARTERY BYPASS GRAFTING (CABG) (N/A) TRANSESOPHAGEAL ECHOCARDIOGRAM (TEE) (N/A) as a surgical intervention .  The patient's history has been reviewed, patient examined, no change in status, stable for surgery.  I have reviewed the patient's chart and labs.  Questions were answered to the patient's satisfaction.     HENDRICKSON,STEVEN C

## 2014-12-21 NOTE — Transfer of Care (Signed)
Immediate Anesthesia Transfer of Care Note  Patient: Cheryl Gill  Procedure(s) Performed: Procedure(s): CORONARY ARTERY BYPASS GRAFTING (CABG) TIMES TWO USING LEFT INTERNAL MAMMARY AND RIGHT SAPHENOUS LEG VEIN HARVESTED ENDOSCOPICALLY (N/A) TRANSESOPHAGEAL ECHOCARDIOGRAM (TEE) (N/A)  Patient Location: ICU  Anesthesia Type:General  Level of Consciousness: Patient remains intubated per anesthesia plan  Airway & Oxygen Therapy: Patient remains intubated per anesthesia plan and Patient placed on Ventilator (see vital sign flow sheet for setting)  Post-op Assessment: Post -op Vital signs reviewed and stable and report given to KiribatiSamantha, RN  Post vital signs: Reviewed and stable  Complications: No apparent anesthesia complications

## 2014-12-21 NOTE — OR Nursing (Signed)
0825 chest incision

## 2014-12-21 NOTE — Brief Op Note (Addendum)
12/17/2014 - 12/21/2014  10:36 AM  PATIENT:  Cheryl Gill  64 y.o. female  PRE-OPERATIVE DIAGNOSIS:  Coronary Artery Disease  POST-OPERATIVE DIAGNOSIS:  Coronary Artery Disease  PROCEDURE:  Procedure(s):  CORONARY ARTERY BYPASS GRAFTING x 2 -LIMA to LAD - SVG to DIAGONAL  SAPHENOUS LEG VEIN HARVESTED ENDOSCOPICALLY  -Right Thigh  TRANSESOPHAGEAL ECHOCARDIOGRAM (TEE) (N/A)  SURGEON:  Surgeon(s) and Role:    * Loreli SlotSteven C Tyon Cerasoli, MD - Primary  PHYSICIAN ASSISTANT: Erin Barrett PA-C  ANESTHESIA:   general  EBL:  Total I/O In: 1000 [I.V.:1000] Out: 450 [Urine:450]  BLOOD ADMINISTERED: CELLSAVER  DRAINS: Left Pleural Chest Tube, Mediastinal Chest tube   LOCAL MEDICATIONS USED:  NONE  SPECIMEN:  No Specimen  DISPOSITION OF SPECIMEN:  N/A  COUNTS:  YES  PLAN OF CARE: Admit to inpatient   PATIENT DISPOSITION:  ICU - intubated and hemodynamically stable.   Delay start of Pharmacological VTE agent (>24hrs) due to surgical blood loss or risk of bleeding: yes  XC= 40 min CPB= 64 min  Good targets and conduits

## 2014-12-21 NOTE — Progress Notes (Signed)
ET tube retracted 1.5cm per CXR and order. ET tube now secure at about 21cm at the lip. Pt tolerated well, equal, bbs heard. RT will continue to monitor.

## 2014-12-21 NOTE — H&P (View-Only) (Signed)
Reason for Consult:Single vessel CAD s/p NSTEMI Referring Physician: Dr. [place]  Cheryl Gill is an 64 y.o. female.  HPI: Cheryl Gill is a 64 yo woman with a history of hypertension, type II diabetes, hyperlipidemia, asthma, and stage III chronic kidney disease. She says her doctor told her she had a slight heart attack in the past, but she has no significant prior history of CAD.   She has been having exertional chest pain for the past few months. This was relieved by rest. Recently she has noticed it was becoming more frequent and more severe. Yesterday she had an episode while driving her car. She described as a pressure in the middle of her chest with radiation to the right arm. She had nausea and vomited once. She tried to relax but the pain persisted. She came to the ED. She was given ASA and NTG x 2 and her pain resolved. EKG was abnormal with dynamic anterior TW changes in V2 as well as I, avL. Her initial troponin was negative but she later ruled in for a non-STEMI.   Today she had cardiac catheterization which revealed severe single vessel CAD with a tight ostial LAD lesion with additional lesions further down in the LAD. She had mild disease, 30- 40%, in the mid RCA. Her left circumflex was free of disease.  She has not had any chest pain since admission.  Of note her creatinine was 1.49 on admission   Past Medical History    Past Medical History  Diagnosis Date  . Diabetes mellitus   . Hypertension   . Hypercholesterolemia   . Seasonal allergies   . CKD (chronic kidney disease), stage III   . Asthma     Past Surgical History  Procedure Laterality Date  . Abdominal hysterectomy    . Tonsillectomy      Family History  Problem Relation Age of Onset  . Pancreatic cancer Mother   . Stroke Father   . CAD Mother     Open heart surgery 60s-70s  . Heart disease Sister     Unclear details    Social History:  reports that she has never smoked. She has never used  smokeless tobacco. She reports that she does not drink alcohol or use illicit drugs.  Allergies:  Allergies  Allergen Reactions  . Ace Inhibitors Anaphylaxis  . Lisinopril Anaphylaxis  . Mushroom Extract Complex Swelling  . Tetanus Toxoids Nausea And Vomiting and Swelling    Medications:  Prior to Admission:  Prescriptions prior to admission  Medication Sig Dispense Refill Last Dose  . albuterol (PROVENTIL HFA;VENTOLIN HFA) 108 (90 BASE) MCG/ACT inhaler Inhale 2 puffs into the lungs every 6 (six) hours as needed. For shortness of breath or wheezing   12/16/2014 at Unknown time  . aspirin 81 MG tablet Take 81 mg by mouth daily.   12/16/2014 at Unknown time  . colchicine 0.6 MG tablet Take 0.6 mg by mouth 2 (two) times daily. As needed.   12/16/2014 at Unknown time  . Fluticasone-Salmeterol (ADVAIR) 250-50 MCG/DOSE AEPB Inhale 1 puff into the lungs daily as needed (for shortness of breath).   Past Week at Unknown time  . glipiZIDE (GLUCOTROL) 10 MG tablet Take 10 mg by mouth 2 (two) times daily before a meal.   12/16/2014 at Unknown time  . hydrochlorothiazide (HYDRODIURIL) 25 MG tablet Take 25 mg by mouth daily.   12/16/2014 at Unknown time  . linagliptin (TRADJENTA) 5 MG TABS tablet Take 5 mg by mouth  daily.   12/16/2014 at Unknown time  . metFORMIN (GLUCOPHAGE) 500 MG tablet Take by mouth 2 (two) times daily with a meal.   12/16/2014 at Unknown time  . simvastatin (ZOCOR) 10 MG tablet Take 10 mg by mouth at bedtime.   12/16/2014 at Unknown time  . ipratropium (ATROVENT) 0.06 % nasal spray Place 2 sprays into both nostrils 4 (four) times daily. (Patient not taking: Reported on 12/17/2014) 15 mL 1 Not Taking at Unknown time    Results for orders placed or performed during the hospital encounter of 12/17/14 (from the past 48 hour(s))  CBC     Status: Abnormal   Collection Time: 12/17/14  2:13 PM  Result Value Ref Range   WBC 11.5 (H) 4.0 - 10.5 K/uL   RBC 4.03 3.87 - 5.11 MIL/uL   Hemoglobin 12.5 12.0 -  15.0 g/dL   HCT 37.5 36.0 - 46.0 %   MCV 93.1 78.0 - 100.0 fL   MCH 31.0 26.0 - 34.0 pg   MCHC 33.3 30.0 - 36.0 g/dL   RDW 12.6 11.5 - 15.5 %   Platelets 212 150 - 400 K/uL  Basic metabolic panel     Status: Abnormal   Collection Time: 12/17/14  2:13 PM  Result Value Ref Range   Sodium 138 135 - 145 mmol/L    Comment: Please note change in reference range.   Potassium 4.1 3.5 - 5.1 mmol/L    Comment: Please note change in reference range.   Chloride 107 96 - 112 mEq/L   CO2 21 19 - 32 mmol/L   Glucose, Bld 351 (H) 70 - 99 mg/dL   BUN 18 6 - 23 mg/dL   Creatinine, Ser 1.49 (H) 0.50 - 1.10 mg/dL   Calcium 9.2 8.4 - 10.5 mg/dL   GFR calc non Af Amer 36 (L) >90 mL/min   GFR calc Af Amer 41 (L) >90 mL/min    Comment: (NOTE) The eGFR has been calculated using the CKD EPI equation. This calculation has not been validated in all clinical situations. eGFR's persistently <90 mL/min signify possible Chronic Kidney Disease.    Anion gap 10 5 - 15  Hepatic function panel     Status: Abnormal   Collection Time: 12/17/14  2:15 PM  Result Value Ref Range   Total Protein 6.8 6.0 - 8.3 g/dL   Albumin 3.8 3.5 - 5.2 g/dL   AST 29 0 - 37 U/L   ALT 27 0 - 35 U/L   Alkaline Phosphatase 93 39 - 117 U/L   Total Bilirubin 0.3 0.3 - 1.2 mg/dL   Bilirubin, Direct 0.1 0.0 - 0.3 mg/dL   Indirect Bilirubin 0.2 (L) 0.3 - 0.9 mg/dL  I-stat troponin, ED     Status: None   Collection Time: 12/17/14  2:22 PM  Result Value Ref Range   Troponin i, poc 0.06 0.00 - 0.08 ng/mL   Comment 3            Comment: Due to the release kinetics of cTnI, a negative result within the first hours of the onset of symptoms does not rule out myocardial infarction with certainty. If myocardial infarction is still suspected, repeat the test at appropriate intervals.   CBG monitoring, ED     Status: Abnormal   Collection Time: 12/17/14  4:54 PM  Result Value Ref Range   Glucose-Capillary 271 (H) 70 - 99 mg/dL   Protime-INR     Status: None   Collection Time: 12/17/14  6:08 PM  Result Value Ref Range   Prothrombin Time 15.1 11.6 - 15.2 seconds   INR 1.18 0.00 - 1.49  TSH     Status: None   Collection Time: 12/17/14  9:20 PM  Result Value Ref Range   TSH 2.125 0.350 - 4.500 uIU/mL  Troponin I-(serum)     Status: Abnormal   Collection Time: 12/17/14  9:21 PM  Result Value Ref Range   Troponin I 0.68 (HH) <0.031 ng/mL    Comment:        POSSIBLE MYOCARDIAL ISCHEMIA. SERIAL TESTING RECOMMENDED. Please note change in reference range. CRITICAL RESULT CALLED TO, READ BACK BY AND VERIFIED WITH: SMITH,B RN 12/17/2014 2251 JORDANS REPEATED TO VERIFY   Hemoglobin A1c     Status: Abnormal   Collection Time: 12/17/14  9:21 PM  Result Value Ref Range   Hgb A1c MFr Bld 9.0 (H) <5.7 %    Comment: (NOTE)                                                                       According to the ADA Clinical Practice Recommendations for 2011, when HbA1c is used as a screening test:  >=6.5%   Diagnostic of Diabetes Mellitus           (if abnormal result is confirmed) 5.7-6.4%   Increased risk of developing Diabetes Mellitus References:Diagnosis and Classification of Diabetes Mellitus,Diabetes VHQI,6962,95(MWUXL 1):S62-S69 and Standards of Medical Care in         Diabetes - 2011,Diabetes Care,2011,34 (Suppl 1):S11-S61.    Mean Plasma Glucose 212 (H) <117 mg/dL    Comment: Performed at [facility]     Status: None   Collection Time: 12/17/14  9:21 PM  Result Value Ref Range   Prothrombin Time 14.3 11.6 - 15.2 seconds   INR 1.10 0.00 - 1.49  CBG monitoring, ED     Status: Abnormal   Collection Time: 12/18/14 12:18 AM  Result Value Ref Range   Glucose-Capillary 237 (H) 70 - 99 mg/dL  Heparin level (unfractionated)     Status: None   Collection Time: 12/18/14  1:20 AM  Result Value Ref Range   Heparin Unfractionated 0.37 0.30 - 0.70 IU/mL    Comment:        IF HEPARIN RESULTS  ARE BELOW EXPECTED VALUES, AND PATIENT DOSAGE HAS BEEN CONFIRMED, SUGGEST FOLLOW UP TESTING OF ANTITHROMBIN III LEVELS.   Troponin I-(serum)     Status: Abnormal   Collection Time: 12/18/14  1:20 AM  Result Value Ref Range   Troponin I 1.62 (HH) <0.031 ng/mL    Comment:        POSSIBLE MYOCARDIAL ISCHEMIA. SERIAL TESTING RECOMMENDED. Please note change in reference range. CRITICAL VALUE NOTED.  VALUE IS CONSISTENT WITH PREVIOUSLY REPORTED AND CALLED VALUE. REPEATED TO VERIFY   CBC     Status: Abnormal   Collection Time: 12/18/14  3:40 AM  Result Value Ref Range   WBC 13.4 (H) 4.0 - 10.5 K/uL   RBC 3.72 (L) 3.87 - 5.11 MIL/uL   Hemoglobin 11.5 (L) 12.0 - 15.0 g/dL   HCT 34.5 (L) 36.0 - 46.0 %   MCV 92.7 78.0 - 100.0 fL   MCH 30.9  26.0 - 34.0 pg   MCHC 33.3 30.0 - 36.0 g/dL   RDW 12.8 11.5 - 15.5 %   Platelets 228 150 - 400 K/uL  Lipid panel     Status: Abnormal   Collection Time: 12/18/14  3:40 AM  Result Value Ref Range   Cholesterol 110 0 - 200 mg/dL   Triglycerides 56 <150 mg/dL   HDL 37 (L) >39 mg/dL   Total CHOL/HDL Ratio 3.0 RATIO   VLDL 11 0 - 40 mg/dL   LDL Cholesterol 62 0 - 99 mg/dL    Comment:        Total Cholesterol/HDL:CHD Risk Coronary Heart Disease Risk Table                     Men   Women  1/2 Average Risk   3.4   3.3  Average Risk       5.0   4.4  2 X Average Risk   9.6   7.1  3 X Average Risk  23.4   11.0        Use the calculated Patient Ratio above and the CHD Risk Table to determine the patient's CHD Risk.        ATP III CLASSIFICATION (LDL):  <100     mg/dL   Optimal  100-129  mg/dL   Near or Above                    Optimal  130-159  mg/dL   Borderline  160-189  mg/dL   High  >190     mg/dL   Very High   Basic metabolic panel     Status: Abnormal   Collection Time: 12/18/14  3:40 AM  Result Value Ref Range   Sodium 140 135 - 145 mmol/L    Comment: Please note change in reference range.   Potassium 3.7 3.5 - 5.1 mmol/L     Comment: Please note change in reference range.   Chloride 108 96 - 112 mEq/L   CO2 23 19 - 32 mmol/L   Glucose, Bld 211 (H) 70 - 99 mg/dL   BUN 17 6 - 23 mg/dL   Creatinine, Ser 1.26 (H) 0.50 - 1.10 mg/dL   Calcium 8.9 8.4 - 10.5 mg/dL   GFR calc non Af Amer 44 (L) >90 mL/min   GFR calc Af Amer 51 (L) >90 mL/min    Comment: (NOTE) The eGFR has been calculated using the CKD EPI equation. This calculation has not been validated in all clinical situations. eGFR's persistently <90 mL/min signify possible Chronic Kidney Disease.    Anion gap 9 5 - 15  MRSA PCR Screening     Status: None   Collection Time: 12/18/14  4:43 AM  Result Value Ref Range   MRSA by PCR NEGATIVE NEGATIVE    Comment:        The GeneXpert MRSA Assay (FDA approved for NASAL specimens only), is one component of a comprehensive MRSA colonization surveillance program. It is not intended to diagnose MRSA infection nor to guide or monitor treatment for MRSA infections.   Troponin I-(serum)     Status: Abnormal   Collection Time: 12/18/14  7:43 AM  Result Value Ref Range   Troponin I 1.59 (HH) <0.031 ng/mL    Comment:        POSSIBLE MYOCARDIAL ISCHEMIA. SERIAL TESTING RECOMMENDED. Please note change in reference range. CRITICAL VALUE NOTED.  VALUE IS CONSISTENT WITH PREVIOUSLY REPORTED  AND CALLED VALUE. REPEATED TO VERIFY   Heparin level (unfractionated)     Status: None   Collection Time: 12/18/14  7:43 AM  Result Value Ref Range   Heparin Unfractionated 0.35 0.30 - 0.70 IU/mL    Comment:        IF HEPARIN RESULTS ARE BELOW EXPECTED VALUES, AND PATIENT DOSAGE HAS BEEN CONFIRMED, SUGGEST FOLLOW UP TESTING OF ANTITHROMBIN III LEVELS.   Glucose, capillary     Status: Abnormal   Collection Time: 12/18/14  7:51 AM  Result Value Ref Range   Glucose-Capillary 207 (H) 70 - 99 mg/dL   Comment 1 Capillary Sample   Glucose, capillary     Status: Abnormal   Collection Time: 12/18/14 12:01 PM  Result Value  Ref Range   Glucose-Capillary 176 (H) 70 - 99 mg/dL   Comment 1 Capillary Sample   Glucose, capillary     Status: Abnormal   Collection Time: 12/18/14  4:54 PM  Result Value Ref Range   Glucose-Capillary 194 (H) 70 - 99 mg/dL   Comment 1 Capillary Sample     Dg Chest Portable 1 View  12/17/2014   CLINICAL DATA:  Right chest pain.  EXAM: PORTABLE CHEST - 1 VIEW  COMPARISON:  PA and lateral chest 05/22/2014.  FINDINGS: Heart size and mediastinal contours are within normal limits. Both lungs are clear. Visualized skeletal structures are unremarkable.  IMPRESSION: Negative exam.   Electronically Signed   By: Drusilla Kanner M.D.   On: 12/17/2014 13:46    Review of Systems  Constitutional: Positive for malaise/fatigue and diaphoresis. Negative for fever and chills.  Respiratory: Positive for shortness of breath.   Cardiovascular: Positive for chest pain. Negative for orthopnea, claudication and leg swelling.  Gastrointestinal: Positive for nausea and vomiting.  All other systems reviewed and are negative.  Blood pressure 124/68, pulse 75, temperature 98.1 F (36.7 C), temperature source Oral, resp. rate 17, height 5\' 2"  (1.575 m), weight 195 lb 12.3 oz (88.8 kg), SpO2 96 %. Physical Exam  Vitals reviewed. Constitutional: She is oriented to person, place, and time. No distress.  obese  HENT:  Head: Normocephalic and atraumatic.  Eyes: EOM are normal. Pupils are equal, round, and reactive to light.  Neck: Neck supple. No thyromegaly present.  No carotid bruits  Cardiovascular: Normal rate, regular rhythm, normal heart sounds and intact distal pulses.  Exam reveals no gallop and no friction rub.   No murmur heard. Respiratory: Effort normal and breath sounds normal. She has no wheezes. She has no rales.  GI: Soft. There is no tenderness.  Musculoskeletal: She exhibits no edema.  Lymphadenopathy:    She has no cervical adenopathy.  Neurological: She is alert and oriented to person,  place, and time. No cranial nerve deficit.  No focal motor deficit  Skin: Skin is warm and dry.   CARDIAC CATHETERIZATION Coronary angiography: Coronary dominance: right  Left mainstem: Normal.  Left anterior descending (LAD): There is a 90-95% ostial LAD stenosis. The mid LAD has sequential 80-90% and 90-95% stenoses. The first diagonal is a moderately large branch without significant disease.  Left circumflex (LCx): The LCx is a single large OM and is normal.   Right coronary artery (RCA): Moderately calcified with 30-40% mid vessel lesion.   Left ventriculography: Left ventricular systolic function is abnormal, there is anterior hypokinesis. LVEF is estimated at 45-50%, there is no significant mitral regurgitation   Final Conclusions:  1. Single vessel obstructive CAD involving the ostium and mid  LAD. 2. Mild LV dysfunction.  Recommendations: The ostial LAD is poorly suited for PCI. I would recommend CABG with bypass of the diagonal and LAD.  Peter Cheryl Gill, MDFACC  ECHOCARDIOGRAM Study Conclusions  - Left ventricle: The cavity size was normal. Wall thickness was normal. Systolic function was normal. The estimated ejection fraction was in the range of 60% to 65%. Wall motion was normal; there were no regional wall motion abnormalities. Doppler parameters are consistent with abnormal left ventricular relaxation (grade 1 diastolic dysfunction). The E/e&' ratio is between 8-15, suggesting indeterminate LV filling pressure. - Left atrium: The atrium was normal in size. - Tricuspid valve: There was trivial regurgitation. - Pulmonary arteries: PA peak pressure: 23 mm Hg (S).  Impressions:  - LVEF 60-65%, normal wall thickness, normal wall motion, diastolic dysfunction, indeterminate LV filling pressure, normal LA size.  Assessment/Plan: 64 yo woman with multiple CRF presented with an unstable coronary syndrome and ruled in for a non-STEMI. At cath she has  severe single vessel CAD involving the ostium of her LAD that is not amenable to PCI. CABG is indicated for survival benefit and relief of symptoms.  I have discussed the general nature of the procedure, the need for general anesthesia, and the incisions to be used. We discussed the expected hospital stay, overall recovery and short and long term outcomes. I reviewed the indications, risks, benefits and alternatives. She understands the risks include, but are not limited to death, stroke, MI, DVT/PE, bleeding, possible need for transfusion, infections, irregular heart rhythms, and other organ system dysfunction including respiratory, renal, or GI complications.   She accepts these risks and agrees to proceed.  Currently first available OR time is Friday morning. Will plan for CABG x 2 then. Will move up if time becomes available.  HENDRICKSON,STEVEN C 12/18/2014, 6:10 PM

## 2014-12-22 ENCOUNTER — Inpatient Hospital Stay (HOSPITAL_COMMUNITY): Payer: Medicare Other

## 2014-12-22 DIAGNOSIS — Z951 Presence of aortocoronary bypass graft: Secondary | ICD-10-CM

## 2014-12-22 DIAGNOSIS — I249 Acute ischemic heart disease, unspecified: Secondary | ICD-10-CM

## 2014-12-22 LAB — POCT I-STAT 3, ART BLOOD GAS (G3+)
ACID-BASE DEFICIT: 4 mmol/L — AB (ref 0.0–2.0)
Acid-base deficit: 3 mmol/L — ABNORMAL HIGH (ref 0.0–2.0)
Bicarbonate: 21.7 mEq/L (ref 20.0–24.0)
Bicarbonate: 21.8 mEq/L (ref 20.0–24.0)
O2 SAT: 98 %
O2 Saturation: 98 %
PCO2 ART: 41.5 mmHg (ref 35.0–45.0)
Patient temperature: 38
TCO2: 23 mmol/L (ref 0–100)
TCO2: 23 mmol/L (ref 0–100)
pCO2 arterial: 38.1 mmHg (ref 35.0–45.0)
pH, Arterial: 7.332 — ABNORMAL LOW (ref 7.350–7.450)
pH, Arterial: 7.37 (ref 7.350–7.450)
pO2, Arterial: 111 mmHg — ABNORMAL HIGH (ref 80.0–100.0)
pO2, Arterial: 103 mmHg — ABNORMAL HIGH (ref 80.0–100.0)

## 2014-12-22 LAB — CBC
HCT: 27 % — ABNORMAL LOW (ref 36.0–46.0)
HCT: 27.8 % — ABNORMAL LOW (ref 36.0–46.0)
HEMOGLOBIN: 8.9 g/dL — AB (ref 12.0–15.0)
Hemoglobin: 9 g/dL — ABNORMAL LOW (ref 12.0–15.0)
MCH: 31.1 pg (ref 26.0–34.0)
MCH: 30.9 pg (ref 26.0–34.0)
MCHC: 33 g/dL (ref 30.0–36.0)
MCHC: 32.4 g/dL (ref 30.0–36.0)
MCV: 94.4 fL (ref 78.0–100.0)
MCV: 95.5 fL (ref 78.0–100.0)
Platelets: 126 10*3/uL — ABNORMAL LOW (ref 150–400)
Platelets: 126 10*3/uL — ABNORMAL LOW (ref 150–400)
RBC: 2.86 MIL/uL — ABNORMAL LOW (ref 3.87–5.11)
RBC: 2.91 MIL/uL — AB (ref 3.87–5.11)
RDW: 13.4 % (ref 11.5–15.5)
RDW: 13.6 % (ref 11.5–15.5)
WBC: 13.8 10*3/uL — ABNORMAL HIGH (ref 4.0–10.5)
WBC: 14.9 10*3/uL — AB (ref 4.0–10.5)

## 2014-12-22 LAB — BASIC METABOLIC PANEL
ANION GAP: 9 (ref 5–15)
BUN: 10 mg/dL (ref 6–23)
CO2: 21 mmol/L (ref 19–32)
Calcium: 8.2 mg/dL — ABNORMAL LOW (ref 8.4–10.5)
Chloride: 111 mEq/L (ref 96–112)
Creatinine, Ser: 1.42 mg/dL — ABNORMAL HIGH (ref 0.50–1.10)
GFR calc non Af Amer: 38 mL/min — ABNORMAL LOW (ref >90)
GFR, EST AFRICAN AMERICAN: 44 mL/min — AB (ref >90)
Glucose, Bld: 99 mg/dL (ref 70–99)
Potassium: 4.2 mmol/L (ref 3.5–5.1)
Sodium: 141 mmol/L (ref 135–145)

## 2014-12-22 LAB — MAGNESIUM
Magnesium: 2.8 mg/dL — ABNORMAL HIGH (ref 1.5–2.5)
Magnesium: 2.5 mg/dL (ref 1.5–2.5)

## 2014-12-22 LAB — GLUCOSE, CAPILLARY
GLUCOSE-CAPILLARY: 162 mg/dL — AB (ref 70–99)
GLUCOSE-CAPILLARY: 103 mg/dL — AB (ref 70–99)
GLUCOSE-CAPILLARY: 104 mg/dL — AB (ref 70–99)
GLUCOSE-CAPILLARY: 110 mg/dL — AB (ref 70–99)
Glucose-Capillary: 100 mg/dL — ABNORMAL HIGH (ref 70–99)
Glucose-Capillary: 117 mg/dL — ABNORMAL HIGH (ref 70–99)
Glucose-Capillary: 118 mg/dL — ABNORMAL HIGH (ref 70–99)
Glucose-Capillary: 119 mg/dL — ABNORMAL HIGH (ref 70–99)
Glucose-Capillary: 120 mg/dL — ABNORMAL HIGH (ref 70–99)
Glucose-Capillary: 127 mg/dL — ABNORMAL HIGH (ref 70–99)
Glucose-Capillary: 143 mg/dL — ABNORMAL HIGH (ref 70–99)
Glucose-Capillary: 145 mg/dL — ABNORMAL HIGH (ref 70–99)
Glucose-Capillary: 159 mg/dL — ABNORMAL HIGH (ref 70–99)
Glucose-Capillary: 94 mg/dL (ref 70–99)
Glucose-Capillary: 100 mg/dL — ABNORMAL HIGH (ref 70–99)
Glucose-Capillary: 101 mg/dL — ABNORMAL HIGH (ref 70–99)
Glucose-Capillary: 102 mg/dL — ABNORMAL HIGH (ref 70–99)
Glucose-Capillary: 114 mg/dL — ABNORMAL HIGH (ref 70–99)
Glucose-Capillary: 116 mg/dL — ABNORMAL HIGH (ref 70–99)
Glucose-Capillary: 117 mg/dL — ABNORMAL HIGH (ref 70–99)
Glucose-Capillary: 197 mg/dL — ABNORMAL HIGH (ref 70–99)
Glucose-Capillary: 88 mg/dL (ref 70–99)
Glucose-Capillary: 91 mg/dL (ref 70–99)

## 2014-12-22 LAB — POCT I-STAT, CHEM 8
BUN: 13 mg/dL (ref 6–23)
CREATININE: 1.5 mg/dL — AB (ref 0.50–1.10)
Calcium, Ion: 1.18 mmol/L (ref 1.13–1.30)
Chloride: 107 mEq/L (ref 96–112)
Glucose, Bld: 143 mg/dL — ABNORMAL HIGH (ref 70–99)
HEMATOCRIT: 29 % — AB (ref 36.0–46.0)
Hemoglobin: 9.9 g/dL — ABNORMAL LOW (ref 12.0–15.0)
Potassium: 4.4 mmol/L (ref 3.5–5.1)
SODIUM: 142 mmol/L (ref 135–145)
TCO2: 18 mmol/L (ref 0–100)

## 2014-12-22 LAB — CREATININE, SERUM
Creatinine, Ser: 1.68 mg/dL — ABNORMAL HIGH (ref 0.50–1.10)
GFR calc Af Amer: 36 mL/min — ABNORMAL LOW (ref >90)
GFR calc non Af Amer: 31 mL/min — ABNORMAL LOW (ref >90)

## 2014-12-22 MED ORDER — FUROSEMIDE 10 MG/ML IJ SOLN
40.0000 mg | Freq: Once | INTRAMUSCULAR | Status: AC
  Administered 2014-12-22: 40 mg via INTRAVENOUS
  Filled 2014-12-22: qty 4

## 2014-12-22 MED ORDER — INSULIN DETEMIR 100 UNIT/ML ~~LOC~~ SOLN
20.0000 [IU] | Freq: Once | SUBCUTANEOUS | Status: AC
Start: 2014-12-22 — End: 2014-12-22
  Administered 2014-12-22: 20 [IU] via SUBCUTANEOUS
  Filled 2014-12-22: qty 0.2

## 2014-12-22 MED ORDER — ENOXAPARIN SODIUM 40 MG/0.4ML ~~LOC~~ SOLN
40.0000 mg | Freq: Every day | SUBCUTANEOUS | Status: DC
  Administered 2014-12-22 – 2014-12-26 (×5): 40 mg via SUBCUTANEOUS
  Filled 2014-12-22 (×6): qty 0.4

## 2014-12-22 MED ORDER — INSULIN DETEMIR 100 UNIT/ML ~~LOC~~ SOLN
20.0000 [IU] | Freq: Every day | SUBCUTANEOUS | Status: DC
  Administered 2014-12-23: 20 [IU] via SUBCUTANEOUS
  Filled 2014-12-22 (×2): qty 0.2

## 2014-12-22 MED ORDER — INSULIN ASPART 100 UNIT/ML ~~LOC~~ SOLN
0.0000 [IU] | SUBCUTANEOUS | Status: DC
  Administered 2014-12-22: 2 [IU] via SUBCUTANEOUS
  Administered 2014-12-22 (×2): 4 [IU] via SUBCUTANEOUS
  Administered 2014-12-23: 5 [IU] via SUBCUTANEOUS
  Administered 2014-12-23 (×2): 2 [IU] via SUBCUTANEOUS
  Administered 2014-12-23: 8 [IU] via SUBCUTANEOUS
  Administered 2014-12-23: 2 [IU] via SUBCUTANEOUS

## 2014-12-22 MED ORDER — ALBUMIN HUMAN 5 % IV SOLN
12.5000 g | Freq: Once | INTRAVENOUS | Status: AC
  Administered 2014-12-22: 12.5 g via INTRAVENOUS
  Filled 2014-12-22: qty 250

## 2014-12-22 NOTE — Progress Notes (Signed)
1 Day Post-Op Procedure(s) (LRB): CORONARY ARTERY BYPASS GRAFTING (CABG) TIMES TWO USING LEFT INTERNAL MAMMARY AND RIGHT SAPHENOUS LEG VEIN HARVESTED ENDOSCOPICALLY (N/A) TRANSESOPHAGEAL ECHOCARDIOGRAM (TEE) (N/A) Subjective: No complaints  Objective: Vital signs in last 24 hours: Temp:  [96.1 F (35.6 C)-100.9 F (38.3 C)] 99.3 F (37.4 C) (01/09 0715) Pulse Rate:  [80-102] 86 (01/09 0715) Cardiac Rhythm:  [-] Normal sinus rhythm (01/09 0800) Resp:  [11-29] 18 (01/09 0715) BP: (88-145)/(55-78) 104/65 mmHg (01/09 0700) SpO2:  [89 %-100 %] 97 % (01/09 0715) Arterial Line BP: (92-137)/(48-72) 120/56 mmHg (01/09 0715) FiO2 (%):  [40 %-50 %] 40 % (01/08 2100) Weight:  [93.8 kg (206 lb 12.7 oz)] 93.8 kg (206 lb 12.7 oz) (01/09 0448)  Hemodynamic parameters for last 24 hours: PAP: (22-39)/(11-24) 34/18 mmHg CO:  [2.7 L/min-3.9 L/min] 3.4 L/min CI:  [1.5 L/min/m2-2.1 L/min/m2] 1.8 L/min/m2  Intake/Output from previous day: 01/08 0701 - 01/09 0700 In: 6359.5 [I.V.:4038.5; Blood:221; IV Piggyback:2100] Out: 6598 [Urine:3995; Blood:2183; Chest Tube:420] Intake/Output this shift: Total I/O In: 60 [I.V.:60] Out: 65 [Urine:45; Chest Tube:20]  General appearance: alert and cooperative Neurologic: intact Heart: regular rate and rhythm, S1, S2 normal, no murmur, click, rub or gallop Lungs: rhonchi bilaterally Extremities: edema mild Wound: dressings dry  Lab Results:  Recent Labs  12/21/14 1830 12/21/14 1914 12/22/14 0442  WBC 10.5  --  13.8*  HGB 9.4* 9.2* 8.9*  HCT 27.2* 27.0* 27.0*  PLT 124*  --  126*   BMET:  Recent Labs  12/21/14 0001  12/21/14 1914 12/22/14 0442  NA 139  < > 142 141  K 3.9  < > 3.9 4.2  CL 111  < > 111 111  CO2 17*  --   --  21  GLUCOSE 201*  < > 163* 99  BUN 15  < > 9 10  CREATININE 1.34*  < > 1.30* 1.42*  CALCIUM 8.4  --   --  8.2*  < > = values in this interval not displayed.  PT/INR:  Recent Labs  12/21/14 1200  LABPROT 17.3*  INR  1.40   ABG    Component Value Date/Time   PHART 7.370 12/21/2014 2244   HCO3 21.8 12/21/2014 2244   TCO2 23 12/21/2014 2244   ACIDBASEDEF 3.0* 12/21/2014 2244   O2SAT 98.0 12/21/2014 2244   CBG (last 3)   Recent Labs  12/21/14 2134 12/21/14 2241 12/21/14 2350  GLUCAP 127* 117* 100*    Assessment/Plan: S/P Procedure(s) (LRB): CORONARY ARTERY BYPASS GRAFTING (CABG) TIMES TWO USING LEFT INTERNAL MAMMARY AND RIGHT SAPHENOUS LEG VEIN HARVESTED ENDOSCOPICALLY (N/A) TRANSESOPHAGEAL ECHOCARDIOGRAM (TEE) (N/A)  She is hemodynamically stable. Normal EF preop 60-65% Chronic kidney disease with preop creat 1.1-1.4. observe Mobilize Diabetes control: preop HgbA1c was 9.0, start Levemir and SSI. d/c tubes/lines Continue foley due to patient in ICU and urinary output monitoring See progression orders   LOS: 5 days    BARTLE,BRYAN K 12/22/2014

## 2014-12-22 NOTE — Progress Notes (Signed)
Patient ID: [name], female   DOB: February 19, 1951, 64 y.o.   MRN: [id number] TCTS Evening Rounds:  Hemodynamically stable on dop 3  Urine output adequate. She diuresed for 2 hrs after lasix 40 IV today. Creatinine stable.  BMET    Component Value Date/Time   NA 142 12/22/2014 1700   K 4.4 12/22/2014 1700   CL 107 12/22/2014 1700   CO2 21 12/22/2014 0442   GLUCOSE 143* 12/22/2014 1700   BUN 13 12/22/2014 1700   CREATININE 1.50* 12/22/2014 1700   CALCIUM 8.2* 12/22/2014 0442   GFRNONAA 31* 12/22/2014 1650   GFRAA 36* 12/22/2014 1650    CBC    Component Value Date/Time   WBC 14.9* 12/22/2014 1650   RBC 2.91* 12/22/2014 1650   HGB 9.9* 12/22/2014 1700   HCT 29.0* 12/22/2014 1700   PLT 126* 12/22/2014 1650   MCV 95.5 12/22/2014 1650   MCH 30.9 12/22/2014 1650   MCHC 32.4 12/22/2014 1650   RDW 13.6 12/22/2014 1650   LYMPHSABS 2.1 01/28/2014 0337   MONOABS 1.4* 01/28/2014 0337   EOSABS 0.1 01/28/2014 0337   BASOSABS 0.0 01/28/2014 [id number]    Continue current plans.

## 2014-12-22 NOTE — Op Note (Signed)
NAMEELYSHA, DAW            ACCOUNT NO.:  [id number]  MEDICAL RECORD NO.:  [id number]  LOCATION:  2S05C                        FACILITY:  [facility]  PHYSICIAN:  Salvatore Decent. Dorris Fetch, M.D.DATE OF BIRTH:  20-Mar-1951  DATE OF PROCEDURE:  12/20/2014 DATE OF DISCHARGE:                              OPERATIVE REPORT   PREOPERATIVE DIAGNOSIS:  Severe single-vessel coronary artery disease, status post non-Q-wave myocardial infarction.  POSTOPERATIVE DIAGNOSIS:  Severe single-vessel coronary artery disease, status post non-Q-wave myocardial infarction.  PROCEDURES:  Median sternotomy, extracorporeal circulation, coronary artery bypass grafting x2 (left internal mammary artery to left anterior descending, saphenous vein graft to first diagonal), endoscopic vein harvest, right thigh.  SURGEON:  Salvatore Decent. Dorris Fetch, M.D.  ASSISTANT:  Lowella Dandy, PA-C.  ANESTHESIA:  General.  FINDINGS:  Good-quality conduits, good-quality targets, preserved left ventricular function by echocardiography, no significant valvular pathology.  CLINICAL NOTE:  Ms. Duer is a 64 year old woman who presented with an unstable coronary syndrome.  She ruled in for a non-Q-wave MI.  At catheterization, she had severe single-vessel coronary artery disease involving the LAD.  There was an ostial lesion that was not amenable to angioplasty.  There was significant disease in the LAD distal to a large diagonal branch as well.  She was advised to undergo coronary artery bypass grafting.  The indications, risks, benefits, and alternatives were discussed in detail with the patient.  She understood and accepted the risks and agreed to proceed.  OPERATIVE NOTE:  Ms. Robello was brought to the preoperative holding area on December 20, 2014, there, Anesthesia placed a Swan-Ganz catheter and an arterial blood pressure monitoring line.  She was taken to the operating room, anesthetized, and intubated.  Intravenous  antibiotics were administered.  Transesophageal echocardiography was performed.  It showed preserved left ventricular function with no significant valvular pathology.  A Foley catheter was placed.  The chest, abdomen, and legs were prepped and draped in the usual sterile fashion.  An incision was made in the medial aspect of the right thigh.  The greater saphenous vein was identified and harvested from lower thigh to the groin, it was a good- quality conduit.  Simultaneously with the vein harvest, a median sternotomy was performed and the left internal mammary artery was harvested using standard technique.  This was difficult due to the patient's sternum being relatively thin and the mammary being in close proximity to it. However, the mammary was a good-quality conduit once harvested.  2000 units of heparin was administered during the vessel harvest.  The remainder of the full heparin dose was given prior to dividing the distal end of the mammary artery.  After harvesting the conduits, the pericardium was opened, the ascending aorta was inspected.  There was no evidence of atherosclerotic disease. After confirming adequate anticoagulation with ACT measurement, the aorta was cannulated via concentric 2-0 Ethibond pledgeted pursestring sutures.  A dual-stage venous cannula was placed via a pursestring suture in the right atrial appendage.  Cardiopulmonary bypass was instituted. The patient's temperature was allowed to drift to 34 degrees Celsius. The coronary arteries were inspected and anastomotic sites were chosen. The conduits were inspected and cut to length.  A foam pad was placed in  the pericardium to insulate the heart.  A temperature probe was placed in the myocardial septum and a cardioplegia cannula was placed in the ascending aorta.  The aorta was crossclamped.  The left ventricle was emptied via the aortic root vent.  Cardiac arrest was achieved with cold antegrade  blood cardioplegia and topical iced saline.  1.5 L of cardioplegia was administered.  There was myocardial septal cooling to 10 degrees Celsius.  There was a rapid diastolic arrest.  A reversed saphenous vein graft then was anastomosed end-to-side to the first diagonal branch of the LAD.  This was a high anterolateral branch. It was a good-quality vessel, it accepted a 1.5-mm probe.  The vein was of good quality, it was anastomosed end-to-side with a running 7-0 Prolene suture.  The probe passed easily proximally and distally. Cardioplegia was administered through the graft.  There was good flow and good hemostasis.  Next, the left internal mammary artery was brought through a window in the pericardium.  The distal end was beveled.  This was a 1.5-mm good- quality conduit, it was anastomosed end-to-side to the distal LAD, which was a 1.5-mm good-quality target.  The anastomosis was performed with a running 8-0 Prolene suture.  At the completion of the anastomosis, the bulldog clamps were briefly removed to inspect for hemostasis. Rapid septal rewarming was noted.  The bulldog clamp was replaced and the mammary pedicle was tacked to the epicardial surface of the heart with 6-0 Prolene sutures.  After administering additional cardioplegia, the vein graft was cut to length.  The cardioplegia cannula was removed from the ascending aorta and the proximal vein graft anastomosis was performed to a 4.5-mm punch aortotomy with a running 6-0 Prolene suture.  At the completion of the proximal anastomosis, the patient was placed in Trendelenburg position. Lidocaine was administered.  The bulldog clamp was again removed from the mammary artery.  De-airing was performed and the aortic crossclamp was removed.  Total crossclamp time was 40 minutes.  The patient spontaneously resumed a bradycardic rhythm and did not require defibrillation.  While rewarming was completed, all proximal and distal  anastomoses were inspected for hemostasis.  Epicardial pacing wires were placed on the right ventricle and right atrium and DDD pacing was initiated at 80 beats per minute.  When the patient had rewarmed to a core temperature of 37 degrees Celsius, she was weaned from cardiopulmonary bypass on the first attempt.  The total bypass time was 64 minutes.  The initial cardiac index was greater than 2 liters/minute/meter squared. Postbypass transesophageal echocardiography revealed some septal dyskinesis consistent with pacing, but otherwise was unchanged from the prebypass study.  A test dose of protamine was administered and was well tolerated.  The atrial and aortic cannulae were removed.  The remainder of the protamine was administered without incident.  Chest was irrigated with warm saline.  Hemostasis was achieved.  The left pleural and mediastinal chest tubes were placed through separate subcostal incisions.  The pericardium was reapproximated over the ascending aorta and base of the heart with interrupted 3-0 silk sutures.  It came together easily without tension or kinking of the underlying grafts. The sternum was closed with heavy-gauge stainless steel wires.  There were no changes in rhythm or blood pressure or pulmonary arterial pressures after chest closure; however, the cardiac index did drop. A low-dose dopamine infusion was initiated and volume resuscitation was initiated as well.  The patient remained hemodynamically stable thereafter.  The pectoralis fascia, subcutaneous tissue, and skin  were closed in standard fashion.  All sponge, needle, and instrument counts were correct at the end of the procedure.  The patient was taken from the operating room to the surgical intensive care unit in good condition.     Daymian Lill C. Dorris FSalvatore DecentetchHendrickson, M.D.     SCH/MEDQ  D:  12/21/2014  T:  12/22/2014  Job:  [id number]

## 2014-12-23 ENCOUNTER — Inpatient Hospital Stay (HOSPITAL_COMMUNITY): Payer: Medicare Other

## 2014-12-23 LAB — BASIC METABOLIC PANEL
Anion gap: 8 (ref 5–15)
BUN: 15 mg/dL (ref 6–23)
CO2: 23 mmol/L (ref 19–32)
Calcium: 8.4 mg/dL (ref 8.4–10.5)
Chloride: 109 mEq/L (ref 96–112)
Creatinine, Ser: 1.71 mg/dL — ABNORMAL HIGH (ref 0.50–1.10)
GFR calc Af Amer: 35 mL/min — ABNORMAL LOW (ref >90)
GFR calc non Af Amer: 30 mL/min — ABNORMAL LOW (ref >90)
Glucose, Bld: 150 mg/dL — ABNORMAL HIGH (ref 70–99)
POTASSIUM: 4.1 mmol/L (ref 3.5–5.1)
Sodium: 140 mmol/L (ref 135–145)

## 2014-12-23 LAB — GLUCOSE, CAPILLARY
GLUCOSE-CAPILLARY: 101 mg/dL — AB (ref 70–99)
GLUCOSE-CAPILLARY: 138 mg/dL — AB (ref 70–99)
GLUCOSE-CAPILLARY: 140 mg/dL — AB (ref 70–99)
GLUCOSE-CAPILLARY: 156 mg/dL — AB (ref 70–99)
Glucose-Capillary: 161 mg/dL — ABNORMAL HIGH (ref 70–99)
Glucose-Capillary: 213 mg/dL — ABNORMAL HIGH (ref 70–99)
Glucose-Capillary: 195 mg/dL — ABNORMAL HIGH (ref 70–99)
Glucose-Capillary: 208 mg/dL — ABNORMAL HIGH (ref 70–99)

## 2014-12-23 LAB — CBC
HCT: 25.7 % — ABNORMAL LOW (ref 36.0–46.0)
Hemoglobin: 8.5 g/dL — ABNORMAL LOW (ref 12.0–15.0)
MCH: 32.2 pg (ref 26.0–34.0)
MCHC: 33.1 g/dL (ref 30.0–36.0)
MCV: 97.3 fL (ref 78.0–100.0)
Platelets: 115 10*3/uL — ABNORMAL LOW (ref 150–400)
RBC: 2.64 MIL/uL — ABNORMAL LOW (ref 3.87–5.11)
RDW: 13.6 % (ref 11.5–15.5)
WBC: 13.3 10*3/uL — ABNORMAL HIGH (ref 4.0–10.5)

## 2014-12-23 MED ORDER — FUROSEMIDE 10 MG/ML IJ SOLN
40.0000 mg | Freq: Once | INTRAMUSCULAR | Status: AC
  Administered 2014-12-23: 40 mg via INTRAVENOUS
  Filled 2014-12-23: qty 4

## 2014-12-23 NOTE — Progress Notes (Signed)
2 Days Post-Op Procedure(s) (LRB): CORONARY ARTERY BYPASS GRAFTING (CABG) TIMES TWO USING LEFT INTERNAL MAMMARY AND RIGHT SAPHENOUS LEG VEIN HARVESTED ENDOSCOPICALLY (N/A) TRANSESOPHAGEAL ECHOCARDIOGRAM (TEE) (N/A) Subjective:  No complaints.  Walked around the ICU this am  750 on IS  Objective: Vital signs in last 24 hours: Temp:  [97.7 F (36.5 C)-99.2 F (37.3 C)] 98.6 F (37 C) (01/10 0817) Pulse Rate:  [79-103] 88 (01/10 0900) Cardiac Rhythm:  [-] Normal sinus rhythm (01/10 0800) Resp:  [11-30] 18 (01/10 0900) BP: (91-156)/(57-81) 139/81 mmHg (01/10 0900) SpO2:  [92 %-100 %] 93 % (01/10 0900) Arterial Line BP: (102-127)/(57-65) 127/58 mmHg (01/09 1500) Weight:  [94.7 kg (208 lb 12.4 oz)] 94.7 kg (208 lb 12.4 oz) (01/10 0500)  Hemodynamic parameters for last 24 hours:    Intake/Output from previous day: 01/09 0701 - 01/10 0700 In: 820 [I.V.:720; IV Piggyback:100] Out: 1110 [Urine:1090; Chest Tube:20] Intake/Output this shift: Total I/O In: 40 [I.V.:40] Out: 125 [Urine:125]  General appearance: alert and cooperative Neurologic: intact Heart: regular rate and rhythm, S1, S2 normal, no murmur, click, rub or gallop Lungs: diminished breath sounds base - left Extremities: edema mild Wound: dressing dry  Lab Results:  Recent Labs  12/22/14 1650 12/22/14 1700 12/23/14 0337  WBC 14.9*  --  13.3*  HGB 9.0* 9.9* 8.5*  HCT 27.8* 29.0* 25.7*  PLT 126*  --  115*   BMET:  Recent Labs  12/22/14 0442  12/22/14 1700 12/23/14 0337  NA 141  --  142 140  K 4.2  --  4.4 4.1  CL 111  --  107 109  CO2 21  --   --  23  GLUCOSE 99  --  143* 150*  BUN 10  --  13 15  CREATININE 1.42*  < > 1.50* 1.71*  CALCIUM 8.2*  --   --  8.4  < > = values in this interval not displayed.  PT/INR:  Recent Labs  12/21/14 1200  LABPROT 17.3*  INR 1.40   ABG    Component Value Date/Time   PHART 7.370 12/21/2014 2244   HCO3 21.8 12/21/2014 2244   TCO2 18 12/22/2014 1700   ACIDBASEDEF 3.0* 12/21/2014 2244   O2SAT 98.0 12/21/2014 2244   CBG (last 3)   Recent Labs  12/22/14 1454 12/22/14 1657 12/22/14 2004  GLUCAP 101* 140* 197*    Assessment/Plan: S/P Procedure(s) (LRB): CORONARY ARTERY BYPASS GRAFTING (CABG) TIMES TWO USING LEFT INTERNAL MAMMARY AND RIGHT SAPHENOUS LEG VEIN HARVESTED ENDOSCOPICALLY (N/A) TRANSESOPHAGEAL ECHOCARDIOGRAM (TEE) (N/A)  She is hemodynamically stable off dopamine.  Preop EF normal  Stage 3 chronic kidney disease: creat up slightly postop. Observe  Volume excess: wt is up 11 lbs from preop. Will diurese further.  Keep in ICU today to mobilize and work on IS due to morbid obesity and deconditioning.  Keep foley in to monitor urine output with diuresis.  Diabetes: glucose under adequate control on Levemir and SSI.   LOS: 6 days    Cheryl Gill K 12/23/2014

## 2014-12-24 ENCOUNTER — Encounter (HOSPITAL_COMMUNITY): Payer: Self-pay | Admitting: Thoracic Surgery (Cardiothoracic Vascular Surgery)

## 2014-12-24 LAB — GLUCOSE, CAPILLARY
GLUCOSE-CAPILLARY: 67 mg/dL — AB (ref 70–99)
GLUCOSE-CAPILLARY: 248 mg/dL — AB (ref 70–99)
GLUCOSE-CAPILLARY: 175 mg/dL — AB (ref 70–99)
Glucose-Capillary: 138 mg/dL — ABNORMAL HIGH (ref 70–99)
Glucose-Capillary: 137 mg/dL — ABNORMAL HIGH (ref 70–99)
Glucose-Capillary: 110 mg/dL — ABNORMAL HIGH (ref 70–99)
Glucose-Capillary: 192 mg/dL — ABNORMAL HIGH (ref 70–99)

## 2014-12-24 LAB — CBC
HEMATOCRIT: 27 % — AB (ref 36.0–46.0)
HEMOGLOBIN: 8.8 g/dL — AB (ref 12.0–15.0)
MCH: 31 pg (ref 26.0–34.0)
MCHC: 32.6 g/dL (ref 30.0–36.0)
MCV: 95.1 fL (ref 78.0–100.0)
PLATELETS: 155 10*3/uL (ref 150–400)
RBC: 2.84 MIL/uL — AB (ref 3.87–5.11)
RDW: 13.6 % (ref 11.5–15.5)
WBC: 14.4 10*3/uL — AB (ref 4.0–10.5)

## 2014-12-24 LAB — BASIC METABOLIC PANEL
ANION GAP: 10 (ref 5–15)
BUN: 17 mg/dL (ref 6–23)
CHLORIDE: 109 meq/L (ref 96–112)
CO2: 22 mmol/L (ref 19–32)
CREATININE: 1.45 mg/dL — AB (ref 0.50–1.10)
Calcium: 8.7 mg/dL (ref 8.4–10.5)
GFR, EST AFRICAN AMERICAN: 43 mL/min — AB (ref >90)
GFR, EST NON AFRICAN AMERICAN: 37 mL/min — AB (ref >90)
Glucose, Bld: 75 mg/dL (ref 70–99)
Potassium: 3.6 mmol/L (ref 3.5–5.1)
Sodium: 141 mmol/L (ref 135–145)

## 2014-12-24 MED ORDER — INSULIN ASPART 100 UNIT/ML ~~LOC~~ SOLN
0.0000 [IU] | Freq: Three times a day (TID) | SUBCUTANEOUS | Status: DC
  Administered 2014-12-24: 5 [IU] via SUBCUTANEOUS
  Administered 2014-12-25 (×2): 3 [IU] via SUBCUTANEOUS
  Administered 2014-12-25: 2 [IU] via SUBCUTANEOUS
  Administered 2014-12-26 (×3): 3 [IU] via SUBCUTANEOUS
  Administered 2014-12-27: 2 [IU] via SUBCUTANEOUS

## 2014-12-24 MED ORDER — GLIPIZIDE 10 MG PO TABS
10.0000 mg | ORAL_TABLET | Freq: Two times a day (BID) | ORAL | Status: DC
  Administered 2014-12-24 – 2014-12-27 (×7): 10 mg via ORAL
  Filled 2014-12-24 (×9): qty 1

## 2014-12-24 MED ORDER — INSULIN ASPART 100 UNIT/ML ~~LOC~~ SOLN
0.0000 [IU] | Freq: Every day | SUBCUTANEOUS | Status: DC
  Administered 2014-12-26: 4 [IU] via SUBCUTANEOUS

## 2014-12-24 MED ORDER — MAGNESIUM HYDROXIDE 400 MG/5ML PO SUSP: 30.0000 mL | Freq: Every day | ORAL | Status: DC | PRN

## 2014-12-24 MED ORDER — MOVING RIGHT ALONG BOOK
Freq: Once | Status: AC
  Administered 2014-12-24: 18:00:00
  Filled 2014-12-24: qty 1

## 2014-12-24 MED ORDER — SODIUM CHLORIDE 0.9 % IJ SOLN
3.0000 mL | Freq: Two times a day (BID) | INTRAMUSCULAR | Status: DC
  Administered 2014-12-24 – 2014-12-26 (×5): 3 mL via INTRAVENOUS

## 2014-12-24 MED ORDER — METOPROLOL TARTRATE 25 MG/10 ML ORAL SUSPENSION
25.0000 mg | Freq: Two times a day (BID) | ORAL | Status: DC
  Filled 2014-12-24 (×8): qty 10

## 2014-12-24 MED ORDER — HYDROCHLOROTHIAZIDE 25 MG PO TABS
25.0000 mg | ORAL_TABLET | Freq: Every day | ORAL | Status: DC
  Administered 2014-12-24 – 2014-12-27 (×4): 25 mg via ORAL
  Filled 2014-12-24 (×4): qty 1

## 2014-12-24 MED ORDER — SODIUM CHLORIDE 0.9 % IJ SOLN: 3.0000 mL | INTRAMUSCULAR | Status: DC | PRN

## 2014-12-24 MED ORDER — SODIUM CHLORIDE 0.9 % IV SOLN: 250.0000 mL | INTRAVENOUS | Status: DC | PRN

## 2014-12-24 MED ORDER — GUAIFENESIN-DM 100-10 MG/5ML PO SYRP
15.0000 mL | ORAL_SOLUTION | ORAL | Status: DC | PRN
  Administered 2014-12-24 – 2014-12-25 (×2): 15 mL via ORAL
  Filled 2014-12-24 (×2): qty 15

## 2014-12-24 MED ORDER — FUROSEMIDE 40 MG PO TABS
40.0000 mg | ORAL_TABLET | Freq: Every day | ORAL | Status: DC
  Administered 2014-12-24 – 2014-12-27 (×4): 40 mg via ORAL
  Filled 2014-12-24 (×4): qty 1

## 2014-12-24 MED ORDER — LINAGLIPTIN 5 MG PO TABS
5.0000 mg | ORAL_TABLET | Freq: Every day | ORAL | Status: DC
  Administered 2014-12-24 – 2014-12-27 (×4): 5 mg via ORAL
  Filled 2014-12-24 (×4): qty 1

## 2014-12-24 MED ORDER — METOPROLOL TARTRATE 25 MG PO TABS
25.0000 mg | ORAL_TABLET | Freq: Two times a day (BID) | ORAL | Status: DC
  Administered 2014-12-24 – 2014-12-27 (×7): 25 mg via ORAL
  Filled 2014-12-24 (×8): qty 1

## 2014-12-24 MED ORDER — ALUM & MAG HYDROXIDE-SIMETH 200-200-20 MG/5ML PO SUSP
15.0000 mL | ORAL | Status: DC | PRN
Start: 2014-12-24 — End: 2014-12-27

## 2014-12-24 NOTE — Progress Notes (Signed)
Hypoglycemic Event  CBG: 67  Treatment: 15 GM carbohydrate snack  Symptoms: None  Follow-up CBG: Time:0417 CBG Result:137  Possible Reasons for Event: Inadequate meal intake  Comments/MD notified:Protocol initiated    Cheryl Gill, Cheryl Gill  Remember to initiate Hypoglycemia Order Set & complete

## 2014-12-24 NOTE — Progress Notes (Signed)
3 Days Post-Op Procedure(s) (LRB): CORONARY ARTERY BYPASS GRAFTING (CABG) TIMES TWO USING LEFT INTERNAL MAMMARY AND RIGHT SAPHENOUS LEG VEIN HARVESTED ENDOSCOPICALLY (N/A) TRANSESOPHAGEAL ECHOCARDIOGRAM (TEE) (N/A) Subjective: Feels well this AM Denies pain and nausea  Objective: Vital signs in last 24 hours: Temp:  [98.3 F (36.8 C)-98.9 F (37.2 C)] 98.8 F (37.1 C) (01/11 0400) Pulse Rate:  [85-104] 91 (01/11 0700) Cardiac Rhythm:  [-] Normal sinus rhythm (01/11 0400) Resp:  [16-35] 21 (01/11 0700) BP: (109-156)/(64-133) 146/85 mmHg (01/11 0700) SpO2:  [90 %-100 %] 96 % (01/11 0700) Weight:  [200 lb 13.4 oz (91.1 kg)] 200 lb 13.4 oz (91.1 kg) (01/11 0500)  Hemodynamic parameters for last 24 hours:    Intake/Output from previous day: 01/10 0701 - 01/11 0700 In: 800 [P.O.:600; I.V.:200] Out: 4150 [Urine:4150] Intake/Output this shift:    General appearance: alert and no distress Neurologic: intact Heart: regular rate and rhythm Lungs: diminished breath sounds bibasilar L>R Abdomen: normal findings: soft, non-tender Wound: clean and dry  Lab Results:  Recent Labs  12/23/14 0337 12/24/14 0225  WBC 13.3* 14.4*  HGB 8.5* 8.8*  HCT 25.7* 27.0*  PLT 115* 155   BMET:  Recent Labs  12/23/14 0337 12/24/14 0225  NA 140 141  K 4.1 3.6  CL 109 109  CO2 23 22  GLUCOSE 150* 75  BUN 15 17  CREATININE 1.71* 1.45*  CALCIUM 8.4 8.7    PT/INR:  Recent Labs  12/21/14 1200  LABPROT 17.3*  INR 1.40   ABG    Component Value Date/Time   PHART 7.370 12/21/2014 2244   HCO3 21.8 12/21/2014 2244   TCO2 18 12/22/2014 1700   ACIDBASEDEF 3.0* 12/21/2014 2244   O2SAT 98.0 12/21/2014 2244   CBG (last 3)   Recent Labs  12/23/14 2338 12/24/14 0335 12/24/14 0417  GLUCAP 138* 67* 137*    Assessment/Plan: S/P Procedure(s) (LRB): CORONARY ARTERY BYPASS GRAFTING (CABG) TIMES TWO USING LEFT INTERNAL MAMMARY AND RIGHT SAPHENOUS LEG VEIN HARVESTED ENDOSCOPICALLY  (N/A) TRANSESOPHAGEAL ECHOCARDIOGRAM (TEE) (N/A) Plan for transfer to step-down: see transfer orders   CV- stable, in SR  Continue beta blocker  Restart HCTZ  RESP- LLL atelectasis- IS  RENAL- creatinine better now ~ baseline  Diuresed well yesterday  Dc foley  PO lasix  ENDO= hypoglycemic this AM  Dc lantus, change to AC/HS SSI  Restart glipizide, tradjenta  Restart metformin tomorrow if Cr < 1.5  SCD + enoxaparin for DVT prophylaxis  Continue cardiac rehab   LOS: 7 days    Abigal Choung C 12/24/2014

## 2014-12-25 ENCOUNTER — Inpatient Hospital Stay (HOSPITAL_COMMUNITY): Payer: Medicare Other

## 2014-12-25 LAB — BASIC METABOLIC PANEL
ANION GAP: 7 (ref 5–15)
BUN: 20 mg/dL (ref 6–23)
CO2: 27 mmol/L (ref 19–32)
Calcium: 8.6 mg/dL (ref 8.4–10.5)
Chloride: 104 mEq/L (ref 96–112)
Creatinine, Ser: 1.48 mg/dL — ABNORMAL HIGH (ref 0.50–1.10)
GFR calc Af Amer: 42 mL/min — ABNORMAL LOW (ref >90)
GFR, EST NON AFRICAN AMERICAN: 36 mL/min — AB (ref >90)
Glucose, Bld: 162 mg/dL — ABNORMAL HIGH (ref 70–99)
POTASSIUM: 4.2 mmol/L (ref 3.5–5.1)
SODIUM: 138 mmol/L (ref 135–145)

## 2014-12-25 LAB — GLUCOSE, CAPILLARY
GLUCOSE-CAPILLARY: 180 mg/dL — AB (ref 70–99)
Glucose-Capillary: 143 mg/dL — ABNORMAL HIGH (ref 70–99)
Glucose-Capillary: 185 mg/dL — ABNORMAL HIGH (ref 70–99)
Glucose-Capillary: 172 mg/dL — ABNORMAL HIGH (ref 70–99)

## 2014-12-25 LAB — CBC
HCT: 30.6 % — ABNORMAL LOW (ref 36.0–46.0)
HEMOGLOBIN: 9.9 g/dL — AB (ref 12.0–15.0)
MCH: 30.7 pg (ref 26.0–34.0)
MCHC: 32.4 g/dL (ref 30.0–36.0)
MCV: 95 fL (ref 78.0–100.0)
Platelets: 230 10*3/uL (ref 150–400)
RBC: 3.22 MIL/uL — AB (ref 3.87–5.11)
RDW: 13.3 % (ref 11.5–15.5)
WBC: 13.7 10*3/uL — ABNORMAL HIGH (ref 4.0–10.5)

## 2014-12-25 MED ORDER — METFORMIN HCL 500 MG PO TABS
500.0000 mg | ORAL_TABLET | Freq: Two times a day (BID) | ORAL | Status: DC
  Administered 2014-12-25 – 2014-12-26 (×2): 500 mg via ORAL
  Filled 2014-12-25 (×4): qty 1

## 2014-12-25 MED ORDER — COLCHICINE 0.6 MG PO TABS: 0.6000 mg | ORAL_TABLET | Freq: Two times a day (BID) | ORAL | Status: DC

## 2014-12-25 MED ORDER — COLCHICINE 0.6 MG PO TABS
0.6000 mg | ORAL_TABLET | Freq: Two times a day (BID) | ORAL | Status: DC | PRN
  Administered 2014-12-25 – 2014-12-26 (×3): 0.6 mg via ORAL
  Filled 2014-12-25 (×5): qty 1

## 2014-12-25 NOTE — Progress Notes (Signed)
      [address]       [place] [id number]             [phone]     Brief procedure:    Asked by nurse to assist with atrial wire removal. Removed without difficulty.  Patient tolerated well    Shauntel Prest E, PA-C

## 2014-12-25 NOTE — Progress Notes (Signed)
Utilization review completed.  

## 2014-12-25 NOTE — Progress Notes (Addendum)
[address]       [place] [address]             [phone]      4 Days Post-Op Procedure(s) (LRB): CORONARY ARTERY BYPASS GRAFTING (CABG) TIMES TWO USING LEFT INTERNAL MAMMARY AND RIGHT SAPHENOUS LEG VEIN HARVESTED ENDOSCOPICALLY (N/A) TRANSESOPHAGEAL ECHOCARDIOGRAM (TEE) (N/A) Subjective: Feels well, some foot pain , has had gout in past   Objective: Vital signs in last 24 hours: Temp:  [98.2 F (36.8 C)-99 F (37.2 C)] 98.7 F (37.1 C) (01/12 0558) Pulse Rate:  [89-94] 89 (01/12 0558) Cardiac Rhythm:  [-] Normal sinus rhythm (01/12 0741) Resp:  [14-23] 18 (01/12 0558) BP: (131-149)/(74-94) 137/86 mmHg (01/12 0558) SpO2:  [97 %-100 %] 97 % (01/12 0558) Weight:  [197 lb 9.6 oz (89.631 kg)] 197 lb 9.6 oz (89.631 kg) (01/12 0558)  Hemodynamic parameters for last 24 hours:    Intake/Output from previous day: 01/11 0701 - 01/12 0700 In: -  Out: 1250 [Urine:1250] Intake/Output this shift:    General appearance: alert, cooperative and no distress Heart: regular rate and rhythm Lungs: mildly dim in right base Abdomen: benign Extremities: min edema Wound: incis healing well  Lab Results:  Recent Labs  12/24/14 0225 12/25/14 0557  WBC 14.4* 13.7*  HGB 8.8* 9.9*  HCT 27.0* 30.6*  PLT 155 230   BMET:  Recent Labs  12/24/14 0225 12/25/14 0557  NA 141 138  K 3.6 4.2  CL 109 104  CO2 22 27  GLUCOSE 75 162*  BUN 17 20  CREATININE 1.45* 1.48*  CALCIUM 8.7 8.6    PT/INR: No results for input(s): LABPROT, INR in the last 72 hours. ABG    Component Value Date/Time   PHART 7.370 12/21/2014 2244   HCO3 21.8 12/21/2014 2244   TCO2 18 12/22/2014 1700   ACIDBASEDEF 3.0* 12/21/2014 2244   O2SAT 98.0 12/21/2014 2244   CBG (last 3)   Recent Labs  12/24/14 1617 12/24/14 2139 12/25/14 0658  GLUCAP 248* 175* 143*    Meds Scheduled Meds: . acetaminophen  1,000 mg Oral 4 times per day   Or  . acetaminophen (TYLENOL) oral liquid  160 mg/5 mL  1,000 mg Per Tube 4 times per day  . aspirin EC  325 mg Oral Daily   Or  . aspirin  324 mg Per Tube Daily  . atorvastatin  80 mg Oral q1800  . bisacodyl  10 mg Oral Daily   Or  . bisacodyl  10 mg Rectal Daily  . docusate sodium  200 mg Oral Daily  . enoxaparin (LOVENOX) injection  40 mg Subcutaneous QHS  . furosemide  40 mg Oral Daily  . glipiZIDE  10 mg Oral BID AC  . hydrochlorothiazide  25 mg Oral Daily  . insulin aspart  0-15 Units Subcutaneous TID WC  . insulin aspart  0-5 Units Subcutaneous QHS  . linagliptin  5 mg Oral Daily  . metoprolol tartrate  25 mg Oral BID   Or  . metoprolol tartrate  25 mg Per Tube BID  . pantoprazole  40 mg Oral Daily  . sodium chloride  3 mL Intravenous Q12H   Continuous Infusions:  PRN Meds:.sodium chloride, albuterol, ALPRAZolam, alum & mag hydroxide-simeth, guaiFENesin-dextromethorphan, magnesium hydroxide, mometasone-formoterol, ondansetron (ZOFRAN) IV, oxyCODONE, sodium chloride, traMADol, zolpidem  Xrays Dg Chest 2 View  12/25/2014   CLINICAL DATA:  Atelectasis  EXAM: CHEST  2 VIEW  COMPARISON:  12/23/2014; 12/22/2014;  05/22/2014  FINDINGS: Grossly unchanged enlarged cardiac silhouette and mediastinal contours post median sternotomy and CABG. Interval removal of right jugular approach vascular sheath. Improved aeration of the lungs with persistent small bilateral effusions and associated bibasilar opacities, left greater than right. No new focal airspace opacities. No evidence of edema. No pneumothorax. Unchanged bones.  IMPRESSION: 1. Interval removal of remaining support apparatus. No pneumothorax. 2. Improved aeration of lungs with persistent small bilateral effusions and associated bibasilar opacities, left greater than right, likely atelectasis.   Electronically Signed   By: Simonne ComeJohn  Watts M.D.   On: 12/25/2014 08:06    Assessment/Plan: S/P Procedure(s) (LRB): CORONARY ARTERY BYPASS GRAFTING (CABG) TIMES TWO USING LEFT INTERNAL  MAMMARY AND RIGHT SAPHENOUS LEG VEIN HARVESTED ENDOSCOPICALLY (N/A) TRANSESOPHAGEAL ECHOCARDIOGRAM (TEE) (N/A)  1 doing very well  2 sugars elevated 143-248 range, creat is 1.48- will restart glucophage 3 rhythm stable 4 gentle diuretic- on home HCTZ as well for BP- not a candidate for ACE inhib currently, add low Dose norvasc with systolic in 130-140-s 5 was on colchicine- will restart for her foot pain which she feels is gout 6 d/c wires 7 cont pulm toilet rehab- poss home in am  LOS: 8 days    GOLD,WAYNE E 12/25/2014  Patient seen and examined, agree with above

## 2014-12-25 NOTE — Discharge Summary (Signed)
Physician Discharge Summary  Patient ID: Cheryl Gill MRN: [id number] DOB/AGE: 12-18-1950 64 y.o.  Admit date: 12/17/2014 Discharge date: 12/27/2014  Admission Diagnoses: Unstable angina  Discharge Diagnoses:  Principal Problem:   NSTEMI (non-ST elevated myocardial infarction) Active Problems:   Essential hypertension   Diabetes mellitus type 2, uncontrolled   Asthma, chronic   HLD (hyperlipidemia)   Unstable angina   Obesity, Class II, BMI 35-39.9   Metabolic syndrome: DM, HTN, Obesity as well as HLD   Ostial LAD disease   S/P CABG x 2   ACS (acute coronary syndrome)   Patient Active Problem List   Diagnosis Date Noted  . ACS (acute coronary syndrome)   . 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 Ostial LAD disease 12/19/2014  . NSTEMI (non-ST elevated myocardial infarction) 12/18/2014  . Obesity, Class II, BMI 35-39.9 12/18/2014  . Metabolic syndrome: DM, HTN, Obesity as well as HLD 12/18/2014  . Unstable angina 12/17/2014  . Frequency of urination 07/17/2014  . Incomplete bladder emptying 07/17/2014  . Nocturia more than twice per night 07/17/2014  . Persistent vomiting 01/28/2014  . UTI (lower urinary tract infection) 01/28/2014  . Abdominal pain 01/28/2014  . Lactic acidosis 01/28/2014  . Asthma, chronic 01/28/2014  . HLD (hyperlipidemia) 01/28/2014  . Leukocytosis, unspecified 01/28/2014  . Normocytic anemia 01/18/2013  . 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 Hypotension 01/16/2013  . Acute renal insufficiency 01/16/2013  . Dehydration 01/16/2013  . Essential hypertension 01/16/2013  . Diabetes mellitus type 2, uncontrolled 01/16/2013  . Seasonal allergies     HPI: At time of admission    Cheryl Gill is a 64 y/o F with history of HTN, DM (10-15 years), HLD, seasonal allergies, asthma, CKD stage III by labs, and no prior cardiac history who [facility] today with chest  pain. For the last 2 months she has had intermittent chest discomfort. Previously it would exclusively occur when she would exert herself such as walking to her car. This would always go away with rest. It has been getting worse recently. Today while driving she noticed an episode of chest pain coming on at rest which was unusual. It radiated to her right arm and she vomited once. Despite resting or trying to remain inactive the pain persisted prompting her to seek care in the ED. She received 324mg  ASA prior to arrival. She was given 2 SL with relief of her pain. It has been on/off since that time. She denies fever, chills, cough, syncope, palpitations, LEE, orthopnea, BRBPR, hematemesis, hematuria and melena. She has gained about 10lbs in 2 months gradually which she feels is due to the holidays. No h/o bleeding problems. No h/o TIA or CVA. The patient was felt to require admission for further evaluation and treatment to include cycling cardiac enzymes to rule out myocardial infarction as well as cardiac catheterization.    Discharged Condition: good  Hospital Course: The patient was admitted and did rule in for non-ST segment elevation myocardial infarction. She was hydrated and cardiac catheterization was done on 12/19/2014 by Peter [name] M.D. The following results were noted:   Cardiac Catheterization Procedure Note  Name: Cheryl Gill MRN: [id number] DOB: 12-18-1950  Procedure: Left Heart Cath, Selective Coronary Angiography, LV angiography  Indication: 64 yo BF with multiple cardiac risk factors presents with an NSTEMI.  Procedural Details: The right wrist was prepped, draped, and anesthetized with 1% lidocaine. Using the modified Seldinger technique, a  6 French slender sheath was introduced into the right radial artery. 3 mg of verapamil was administered through the sheath, weight-based unfractionated heparin was administered intravenously. Standard  Judkins catheters were used for selective coronary angiography and left ventriculography. Catheter exchanges were performed over an exchange length guidewire. There were no immediate procedural complications. A TR band was used for radial hemostasis at the completion of the procedure. The patient was transferred to the post catheterization recovery area for further monitoring.  Procedural Findings: Hemodynamics: AO 115/67 mean 85 mm Hg LV 117/15 mm Hg  Coronary angiography: Coronary dominance: right  Left mainstem: Normal.  Left anterior descending (LAD): There is a 90-95% ostial LAD stenosis. The mid LAD has sequential 80-90% and 90-95% stenoses. The first diagonal is a moderately large branch without significant disease.  Left circumflex (LCx): The LCx is a single large OM and is normal.   Right coronary artery (RCA): Moderately calcified with 30-40% mid vessel lesion.   Left ventriculography: Left ventricular systolic function is abnormal, there is anterior hypokinesis. LVEF is estimated at 45-50%, there is no significant mitral regurgitation   Final Conclusions:  1. 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 2. Mild LV dysfunction.  Recommendations: The ostial LAD is poorly suited for PCI. I would recommend CABG with bypass of the diagonal and LAD.  Peter Swaziland, MDFACC  12/18/2014, 2:27 PM  Due to these findings cardiothoracic surgical consultation was obtained with Charlett Lango M.D. who evaluated the patient and her studies and agreed with recommendations for coronary artery surgical revascularization. The patient was medically stabilized and on 12/20/2014 taken to the cardiac operating room where she underwent the following procedure:   DATE OF PROCEDURE: 12/20/2014 DATE OF DISCHARGE:   OPERATIVE REPORT   PREOPERATIVE DIAGNOSIS: Severe single-vessel coronary artery disease, status post non-Q-wave myocardial  infarction.  POSTOPERATIVE DIAGNOSIS: Severe single-vessel coronary artery disease, status post non-Q-wave myocardial infarction.  PROCEDURES: Median sternotomy, extracorporeal circulation, coronary artery bypass grafting x2 (left internal mammary artery to left anterior descending, saphenous vein graft to first diagonal), endoscopic vein harvest, right thigh.  SURGEON: Salvatore Decent. Dorris Fetch, M.D.  ASSISTANTDenny Peon Barrett.  ANESTHESIA: General.  FINDINGS: Good-quality conduits, good-quality targets, preserved left ventricular function by echocardiography, no significant valvular pathology. The patient was taken from the operating room to the surgical intensive care unit in good condition.  Postoperative hospital course:  Overall the patient has progressed nicely. She was weaned from the ventilator without significant difficulties using standard protocols. She initially did require some dopamine which was weaned over time. She had some increase in her renal insufficiency but this has stabilized. Blood sugars have been somewhat difficult to control what they are improving over time with resumption of home medications.  However due to elevated creatinine her Metformin will not be restarted at this time.  This will need to be re-evaluated by her primary care physician.   All routine lines, monitors and drainage devices have been discontinued in the standard fashion. Incisions are healing well without evidence of infection. She has some mild volume overload but this is responding to diuretics. She has a mild acute blood loss anemia. Oxygen has been weaned and she maintains good saturations on room air. Overall her status is felt to be tentatively stable for discharge on 12/27/2014.  Consults: None  Significant Diagnostic Studies: angiography: cardiac cath  Treatments: surgery: as above  Disposition: 01-Home or Self Care  Medications at time of discharge:    Medication  List  STOP taking these medications        aspirin 81 MG tablet  Replaced by:  aspirin 325 MG EC tablet     metFORMIN 500 MG tablet  Commonly known as:  GLUCOPHAGE      TAKE these medications        albuterol 108 (90 BASE) MCG/ACT inhaler  Commonly known as:  PROVENTIL HFA;VENTOLIN HFA  Inhale 2 puffs into the lungs every 6 (six) hours as needed. For shortness of breath or wheezing     aspirin 325 MG EC tablet  Take 1 tablet (325 mg total) by mouth daily.     colchicine 0.6 MG tablet  Take 0.6 mg by mouth 2 (two) times daily as needed (gout).     Fluticasone-Salmeterol 250-50 MCG/DOSE Aepb  Commonly known as:  ADVAIR  Inhale 1 puff into the lungs daily as needed (for shortness of breath).     glipiZIDE 10 MG tablet  Commonly known as:  GLUCOTROL  Take 10 mg by mouth 2 (two) times daily before a meal.     guaiFENesin-dextromethorphan 100-10 MG/5ML syrup  Commonly known as:  ROBITUSSIN DM  Take 15 mLs by mouth every 4 (four) hours as needed for cough.     hydrochlorothiazide 25 MG tablet  Commonly known as:  HYDRODIURIL  Take 25 mg by mouth daily.     ipratropium 0.06 % nasal spray  Commonly known as:  ATROVENT  Place 2 sprays into both nostrils 4 (four) times daily.     metoprolol tartrate 25 MG tablet  Commonly known as:  LOPRESSOR  Take 1 tablet (25 mg total) by mouth 2 (two) times daily.     oxyCODONE 5 MG immediate release tablet  Commonly known as:  Oxy IR/ROXICODONE  Take 1-2 tablets (5-10 mg total) by mouth every 3 (three) hours as needed for severe pain.     simvastatin 10 MG tablet  Commonly known as:  ZOCOR  Take 10 mg by mouth at bedtime.     TRADJENTA 5 MG Tabs tablet  Generic drug:  linagliptin  Take 5 mg by mouth daily.     traMADol 50 MG tablet  Commonly known as:  ULTRAM  Take 1-2 tablets (50-100 mg total) by mouth every 4 (four) hours as needed for moderate pain.         The patient has been discharged on:   1.Beta Blocker:  Yes  [ y  ]                              No   [   ]                              If No, reason:  2.Ace Inhibitor/ARB: Yes [   ]                                     No  [ n   ]                                     If No, reason:renal insufficiency  3.Statin:   Yes Cove.Etienne   ]  No  [   ]                  If No, reason:  4.Ecasa:  Yes  Cove.Etienne   ]                  No   [   ]                  If No, reason:       Follow-up Information    Follow up with Loreli Slot, MD.   Specialty:  Cardiothoracic Surgery   Contact information:   [address] [place] [address] [phone]       Follow up with Peter Swaziland, MD.   Specialty:  Cardiology   Contact information:   [address] [place] [address] [phone]       Signed: Rowe Clack 12/25/2014, 9:53 AM

## 2014-12-25 NOTE — Progress Notes (Signed)
Medicare Important Message given? YES  (If response is "NO", the following Medicare IM given date fields will be blank)  Date Medicare IM given: 12/25/14 Medicare IM given by:  Sagan Maselli  

## 2014-12-25 NOTE — Progress Notes (Signed)
Removed epicardial wires per order. 2 intact.  Pt tolerated procedure well. Met resistance on right side and called PA Jadene Pierini.  Pt instructed to remain on bedrest for one hour.  Frequent vitals will be taken and documented. Pt resting with call bell within reach. [name], RN

## 2014-12-25 NOTE — Progress Notes (Signed)
CARDIAC REHAB PHASE I   PRE:  Rate/Rhythm: 96 SR    BP: sitting 125/82    SaO2: 95 RA  MODE:  Ambulation: 460 ft   POST:  Rate/Rhythm: 108 ST    BP: sitting 140/75     SaO2: 95 RA  Pt moving well with RW. No assist needed. C/o left foot pain on top. Sts she could have walked farther if not for that. Return to recliner after walking. Pt would like RW for home use. VSS. Will ed with daughter present tomorrow. [phone]   Elissa LovettReeve, Yuniel Blaney KemptonKristan CES, ACSM 12/25/2014 11:13 AM

## 2014-12-26 LAB — BASIC METABOLIC PANEL
Anion gap: 14 (ref 5–15)
BUN: 25 mg/dL — ABNORMAL HIGH (ref 6–23)
CO2: 23 mmol/L (ref 19–32)
CREATININE: 1.8 mg/dL — AB (ref 0.50–1.10)
Calcium: 8.5 mg/dL (ref 8.4–10.5)
Chloride: 98 mEq/L (ref 96–112)
GFR calc Af Amer: 33 mL/min — ABNORMAL LOW (ref >90)
GFR calc non Af Amer: 28 mL/min — ABNORMAL LOW (ref >90)
GLUCOSE: 180 mg/dL — AB (ref 70–99)
Potassium: 3.6 mmol/L (ref 3.5–5.1)
SODIUM: 135 mmol/L (ref 135–145)

## 2014-12-26 LAB — GLUCOSE, CAPILLARY
GLUCOSE-CAPILLARY: 152 mg/dL — AB (ref 70–99)
GLUCOSE-CAPILLARY: 314 mg/dL — AB (ref 70–99)
Glucose-Capillary: 153 mg/dL — ABNORMAL HIGH (ref 70–99)
Glucose-Capillary: 188 mg/dL — ABNORMAL HIGH (ref 70–99)

## 2014-12-26 MED FILL — Magnesium Sulfate Inj 50%: INTRAMUSCULAR | Qty: 10 | Status: AC

## 2014-12-26 MED FILL — Heparin Sodium (Porcine) Inj 1000 Unit/ML: INTRAMUSCULAR | Qty: 30 | Status: AC

## 2014-12-26 MED FILL — Potassium Chloride Inj 2 mEq/ML: INTRAVENOUS | Qty: 40 | Status: AC

## 2014-12-26 NOTE — Progress Notes (Signed)
CARDIAC REHAB PHASE I   PRE:  Rate/Rhythm: 94 SR    BP: sitting 115/74    SaO2: 97 RA  MODE:  Ambulation: 70 ft   POST:  Rate/Rhythm: 100 ST    BP: sitting 124/73     SaO2: 97 RA  Pt c/o worse gout pain today. Has migrated to her ankle. Limping while walking with RW. Asked to return back to room after 35 ft in hall. Otherwise pt doing well, no c/o. Return to recliner. Will f/u tomorrow. Has RW in room to walk with later. [phone]   Elissa LovettReeve, Chestine Belknap LakewoodKristan CES, ACSM 12/26/2014 1:51 PM

## 2014-12-26 NOTE — Progress Notes (Signed)
Ambulated about 500 ft using front wheel walker on room air tolerated well.

## 2014-12-26 NOTE — Progress Notes (Addendum)
       [address]       [place] [id number]             [phone]          5 Days Post-Op Procedure(s) (LRB): CORONARY ARTERY BYPASS GRAFTING (CABG) TIMES TWO USING LEFT INTERNAL MAMMARY AND RIGHT SAPHENOUS LEG VEIN HARVESTED ENDOSCOPICALLY (N/A) TRANSESOPHAGEAL ECHOCARDIOGRAM (TEE) (N/A)  Subjective: Feels well, no complaints except pain in left foot from gout.     Objective: Vital signs in last 24 hours: Patient Vitals for the past 24 hrs:  BP Temp Temp src Pulse Resp SpO2 Weight  12/26/14 0626 136/70 mmHg 98.4 F (36.9 C) Oral 96 18 99 % 198 lb 10.2 oz (90.1 kg)  12/25/14 2016 104/62 mmHg 99 F (37.2 C) Oral 100 18 100 % -  12/25/14 1424 138/81 mmHg 99.6 F (37.6 C) Oral (!) 104 18 98 % -   Current Weight  12/26/14 198 lb 10.2 oz (90.1 kg)     Intake/Output from previous day: 01/12 0701 - 01/13 0700 In: 480 [P.O.:480] Out: -   CBGs 172-180-180-153   PHYSICAL EXAM:  Heart: RRR Lungs: Clear Wound: Clean and dry Extremities: Trace LE edema    Lab Results: CBC: Recent Labs  12/24/14 0225 12/25/14 0557  WBC 14.4* 13.7*  HGB 8.8* 9.9*  HCT 27.0* 30.6*  PLT 155 230   BMET:  Recent Labs  12/25/14 0557 12/26/14 0353  NA 138 135  K 4.2 3.6  CL 104 98  CO2 27 23  GLUCOSE 162* 180*  BUN 20 25*  CREATININE 1.48* 1.80*  CALCIUM 8.6 8.5    PT/INR: No results for input(s): LABPROT, INR in the last 72 hours.    Assessment/Plan: S/P Procedure(s) (LRB): CORONARY ARTERY BYPASS GRAFTING (CABG) TIMES TWO USING LEFT INTERNAL MAMMARY AND RIGHT SAPHENOUS LEG VEIN HARVESTED ENDOSCOPICALLY (N/A) TRANSESOPHAGEAL ECHOCARDIOGRAM (TEE) (N/A)  CV- SR, BPs stable. Continue present meds.  Vol overload- continue diuresis.  DM- sugars stable, continue Victoza, Glucotrol.  CKD- baseline Cr 1.5 on admission, bumped up to 1.8 after Metformin resumed yesterday. Will  d/c Metformin and recheck creatinine.  Possible d/c 1-2 days if creatinine  trending back down.    LOS: 9 days    COLLINS,GINA H 12/26/2014  Patient seen and examined, agree with above Home tomorrow if creatinine down

## 2014-12-27 LAB — GLUCOSE, CAPILLARY
GLUCOSE-CAPILLARY: 140 mg/dL — AB (ref 70–99)
Glucose-Capillary: 247 mg/dL — ABNORMAL HIGH (ref 70–99)

## 2014-12-27 LAB — BASIC METABOLIC PANEL
Anion gap: 16 — ABNORMAL HIGH (ref 5–15)
BUN: 30 mg/dL — AB (ref 6–23)
CHLORIDE: 97 meq/L (ref 96–112)
CO2: 27 mmol/L (ref 19–32)
CREATININE: 1.75 mg/dL — AB (ref 0.50–1.10)
Calcium: 8.7 mg/dL (ref 8.4–10.5)
GFR calc Af Amer: 34 mL/min — ABNORMAL LOW (ref >90)
GFR, EST NON AFRICAN AMERICAN: 29 mL/min — AB (ref >90)
Glucose, Bld: 125 mg/dL — ABNORMAL HIGH (ref 70–99)
POTASSIUM: 4.9 mmol/L (ref 3.5–5.1)
Sodium: 140 mmol/L (ref 135–145)

## 2014-12-27 MED ORDER — TRAMADOL HCL 50 MG PO TABS: 50.0000 mg | ORAL_TABLET | ORAL | Status: DC | PRN

## 2014-12-27 MED ORDER — ASPIRIN 325 MG PO TBEC: 325.0000 mg | DELAYED_RELEASE_TABLET | Freq: Every day | ORAL | Status: DC

## 2014-12-27 MED ORDER — METOPROLOL TARTRATE 25 MG PO TABS: 25.0000 mg | ORAL_TABLET | Freq: Two times a day (BID) | ORAL | Status: DC

## 2014-12-27 MED ORDER — GUAIFENESIN-DM 100-10 MG/5ML PO SYRP: 15.0000 mL | ORAL_SOLUTION | ORAL | Status: DC | PRN

## 2014-12-27 MED ORDER — OXYCODONE HCL 5 MG PO TABS: 5.0000 mg | ORAL_TABLET | ORAL | Status: DC | PRN

## 2014-12-27 NOTE — Progress Notes (Addendum)
      [address]       [place] [id number]             [phone]      6 Days Post-Op Procedure(s) (LRB): CORONARY ARTERY BYPASS GRAFTING (CABG) TIMES TWO USING LEFT INTERNAL MAMMARY AND RIGHT SAPHENOUS LEG VEIN HARVESTED ENDOSCOPICALLY (N/A) TRANSESOPHAGEAL ECHOCARDIOGRAM (TEE) (N/A)   Subjective:  [name] has no complaints this morning.  She states her left foot pain has improved.  She states she slept well last night and is hoping to go home today.  + ambulation  + BM  Objective: Vital signs in last 24 hours: Temp:  [98.6 F (37 C)] 98.6 F (37 C) (01/13 2100) Pulse Rate:  [90-101] 101 (01/13 2100) Cardiac Rhythm:  [-] Normal sinus rhythm (01/13 2000) Resp:  [18] 18 (01/13 2100) BP: (99-123)/(65-74) 99/65 mmHg (01/13 2100) SpO2:  [97 %-99 %] 97 % (01/13 2100) Weight:  [192 lb 0.3 oz (87.1 kg)-194 lb 14.2 oz (88.4 kg)] 192 lb 0.3 oz (87.1 kg) (01/14 0700)  Intake/Output from previous day: 01/13 0701 - 01/14 0700 In: 483 [P.O.:480; I.V.:3] Out: -   General appearance: alert, cooperative and no distress Heart: regular rate and rhythm Lungs: clear to auscultation bilaterally Abdomen: soft, non-tender; bowel sounds normal; no masses,  no organomegaly Extremities: edema trace Wound: clean and dry  Lab Results:  Recent Labs  12/25/14 0557  WBC 13.7*  HGB 9.9*  HCT 30.6*  PLT 230   BMET:  Recent Labs  12/25/14 0557 12/26/14 0353  NA 138 135  K 4.2 3.6  CL 104 98  CO2 27 23  GLUCOSE 162* 180*  BUN 20 25*  CREATININE 1.48* 1.80*  CALCIUM 8.6 8.5    PT/INR: No results for input(s): LABPROT, INR in the last 72 hours. ABG    Component Value Date/Time   PHART 7.370 12/21/2014 2244   HCO3 21.8 12/21/2014 2244   TCO2 18 12/22/2014 1700   ACIDBASEDEF 3.0* 12/21/2014 2244   O2SAT 98.0 12/21/2014 2244   CBG (last 3)   Recent Labs  12/26/14 1616 12/26/14 2139 12/27/14 0555  GLUCAP 152* 314* 140*    Assessment/Plan: S/P  Procedure(s) (LRB): CORONARY ARTERY BYPASS GRAFTING (CABG) TIMES TWO USING LEFT INTERNAL MAMMARY AND RIGHT SAPHENOUS LEG VEIN HARVESTED ENDOSCOPICALLY (N/A) TRANSESOPHAGEAL ECHOCARDIOGRAM (TEE) (N/A)  1. CV- NSR- continue Lopressor 2. Pulm- no acute issues, off oxygen, encouraged use of IS at discharge 3. Renal- BMET pending, weight is at basline, on home HCTZ 4. DM- sugars stable continue glipizide and linagliptin- continue to hold metformin with elevated creatinine 5. Dispo- patient doing very well, feels great, awaiting BMET results if creatinine has decreased will d/c patient home today   LOS: 10 days    BARRETT, ERIN 12/27/2014  Patient seen and examined, renal function stable, pain in left foot is better Home later today

## 2014-12-27 NOTE — Progress Notes (Signed)
CARDIAC REHAB PHASE I   PRE:  Rate/Rhythm: 100 SR  BP:  Supine:   Sitting: 108/68  Standing:    SaO2: 97%RA  MODE:  Ambulation: 160 ft   POST:  Rate/Rhythm: 111 ST  BP:  Supine:   Sitting: 108/55  Standing:    SaO2: 96%RA [id number] Pt walked 160 ft on RA with rolling walker and was able to bare weight on left foot. Not as sore today. To recliner after walk. Will return and educate when daughter arrives.   Luetta Nuttingharlene Keondre Markson, RN BSN  12/27/2014 9:52 AM

## 2014-12-27 NOTE — Progress Notes (Signed)
[id number] Education completed with pt and daughter who voiced understanding. Encouraged pt to follow diabetic diet and discussed carb counting and heart healthy diets. Encouraged IS. Discussed CRP 2 and pt gave permission to refer to [place]. Luetta NuttingCharlene Alajia Schmelzer RN BSN 12/27/2014 11:05 AM

## 2014-12-27 NOTE — Discharge Instructions (Signed)
Endoscopic Saphenous Vein Harvesting Care After Refer to this sheet in the next few weeks. These instructions provide you with information on caring for yourself after your procedure. Your health care provider may also give you more specific instructions. Your treatment has been planned according to current medical practices, but problems sometimes occur. Call your health care provider if you have any problems or questions after your procedure. HOME CARE INSTRUCTIONS Medicine  Take whatever pain medicine your surgeon prescribes. Follow the directions carefully. Do not take over-the-counter pain medicine unless your surgeon says it is okay. Some pain medicine can cause bleeding problems for several weeks after surgery.  Follow your surgeon's instructions about driving. You will probably not be permitted to drive after heart surgery.  Take any medicines your surgeon prescribes. Any medicines you took before your heart surgery should be checked with your health care provider before you start taking them again. Wound care  If your surgeon has prescribed an elastic bandage or stocking, ask how long you should wear it.  Check the area around your surgical cuts (incisions) whenever your bandages (dressings) are changed. Look for any redness or swelling.  You will need to return to have the stitches (sutures) or staples taken out. Ask your surgeon when to do that.  Ask your surgeon when you can shower or bathe. Activity  Try to keep your legs raised when you are sitting.  Do any exercises your health care providers have given you. These may include deep breathing exercises, coughing, walking, or other exercises. SEEK MEDICAL CARE IF:  You have any questions about your medicines.  You have more leg pain, especially if your pain medicine stops working.  New or growing bruises develop on your leg.  Your leg swells, feels tight, or becomes red.  You have numbness in your leg. SEEK IMMEDIATE  MEDICAL CARE IF:  Your pain gets much worse.  Blood or fluid leaks from any of the incisions.  Your incisions become warm, swollen, or red.  You have chest pain.  You have trouble breathing.  You have a fever.  You have more pain near your leg incision. MAKE SURE YOU:  Understand these instructions.  Will watch your condition.  Will get help right away if you are not doing well or get worse. Document Released: 08/12/2011 Document Revised: 12/05/2013 Document Reviewed: 08/12/2011 [facility] Patient Information 2015 [place], [place]. This information is not intended to replace advice given to you by your health care provider. Make sure you discuss any questions you have with your health care provider.  Coronary Artery Bypass Grafting, Care After These instructions give you information on caring for yourself after your procedure. Your doctor may also give you more specific instructions. Call your doctor if you have any problems or questions after your procedure.  HOME CARE  Only take medicine as told by your doctor. Take medicines exactly as told. Do not stop taking medicines or start any new medicines without talking to your doctor first.  Take your pulse as told by your doctor.  Do deep breathing as told by your doctor. Use your breathing device (incentive spirometer), if given, to practice deep breathing several times a day. Support your chest with a pillow or your arms when you take deep breaths or cough.  Keep the area clean, dry, and protected where the surgery cuts (incisions) were made. Remove bandages (dressings) only as told by your doctor. If strips were applied to surgical area, do not take them off. They fall  off on their own.  Check the surgery area daily for puffiness (swelling), redness, or leaking fluid.  If surgery cuts were made in your legs:  Avoid crossing your legs.  Avoid sitting for long periods of time. Change positions every 30 minutes.  Raise your  legs when you are sitting. Place them on pillows.  Wear stockings that help keep blood clots from forming in your legs (compression stockings).  Only take sponge baths until your doctor says it is okay to take showers. Pat the surgery area dry. Do not rub the surgery area with a washcloth or towel. Do not bathe, swim, or use a hot tub until your doctor says it is okay.  Eat foods that are high in fiber. These include raw fruits and vegetables, whole grains, beans, and nuts. Choose lean meats. Avoid canned, processed, and fried foods.  Drink enough fluids to keep your pee (urine) clear or pale yellow.  Weigh yourself every day.  Rest and limit activity as told by your doctor. You may be told to:  Stop any activity if you have chest pain, shortness of breath, changes in heartbeat, or dizziness. Get help right away if this happens.  Move around often for short amounts of time or take short walks as told by your doctor. Gradually become more active. You may need help to strengthen your muscles and build endurance.  Avoid lifting, pushing, or pulling anything heavier than 10 pounds (4.5 kg) for at least 6 weeks after surgery.  Do not drive until your doctor says it is okay.  Ask your doctor when you can go back to work.  Ask your doctor when you can begin sexual activity again.  Follow up with your doctor as told. GET HELP IF:  You have puffiness, redness, more pain, or fluid draining from the incision site.  You have a fever.  You have puffiness in your ankles or legs.  You have pain in your legs.  You gain 2 or more pounds (0.9 kg) a day.  You feel sick to your stomach (nauseous) or throw up (vomit).  You have watery poop (diarrhea). GET HELP RIGHT AWAY IF:  You have chest pain that goes to your jaw or arms.  You have shortness of breath.  You have a fast or irregular heartbeat.  You notice a "clicking" in your breastbone when you move.  You have numbness or weakness  in your arms or legs.  You feel dizzy or light-headed. MAKE SURE YOU:  Understand these instructions.  Will watch your condition.  Will get help right away if you are not doing well or get worse. Document Released: 12/05/2013 Document Reviewed: 12/05/2013 [facility] Patient Information 2015 [place]. This information is not intended to replace advice given to you by your health care provider. Make sure you discuss any questions you have with your health care provider.   HOLD METFORMIN FOR NOW, YOU NEED TO MAKE APPOINTMENT TO SEE PCP IN 1 WEEK.  ONCE CREATININE NORMALIZES IT WOULD BE SAFE TO RESTART THIS MEDICATION IF PRIMARY PHYSICIANS FEELS ITS APPROPRIATE

## 2014-12-27 NOTE — Progress Notes (Signed)
Pt discharged home with daughter Discharge instructions given & reviewed Education discussed  IV dc'd  Tele dc'd  Order to remove sutures, no sutures noted.  Pt discharged via wheelchair with volunteer services.  Darrel HooverWilson,Koby Hartfield S 12:13 PM

## 2014-12-28 ENCOUNTER — Telehealth: Payer: Self-pay | Admitting: *Deleted

## 2014-12-28 NOTE — Telephone Encounter (Signed)
[name] has called with complaints of low left sided back pain that beganI told her if this pain remains persistent around 2 hours ago.  She was discharged recently s/p CABG. She is not having any shortness of breath, urinary symptoms but it does hurt to take a deep breath. I suggested a heating pad on low, taking her pain medicine or just Tylenol.  She would have to be seen for this to be fully evaluated.  She also c/o some popping in her chest but I reassured her that this is not unusual right after surgery.  I told her that if this pain remains persistent, she could call and speak with our physician on call this weekend and she agreed.

## 2015-01-01 ENCOUNTER — Encounter: Payer: Self-pay | Admitting: *Deleted

## 2015-01-03 ENCOUNTER — Telehealth: Payer: Self-pay | Admitting: *Deleted

## 2015-01-03 NOTE — Telephone Encounter (Signed)
Faxed signed cardiac rehab phase ll 

## 2015-01-09 ENCOUNTER — Ambulatory Visit (INDEPENDENT_AMBULATORY_CARE_PROVIDER_SITE_OTHER): Payer: Medicare Other | Admitting: Physician Assistant

## 2015-01-09 ENCOUNTER — Encounter: Payer: Self-pay | Admitting: Physician Assistant

## 2015-01-09 VITALS — BP 108/70 | HR 84 | Ht 62.0 in | Wt 190.8 lb

## 2015-01-09 DIAGNOSIS — E1165 Type 2 diabetes mellitus with hyperglycemia: Secondary | ICD-10-CM

## 2015-01-09 DIAGNOSIS — I10 Essential (primary) hypertension: Secondary | ICD-10-CM

## 2015-01-09 DIAGNOSIS — E669 Obesity, unspecified: Secondary | ICD-10-CM

## 2015-01-09 DIAGNOSIS — I2583 Coronary atherosclerosis due to lipid rich plaque: Principal | ICD-10-CM

## 2015-01-09 DIAGNOSIS — I249 Acute ischemic heart disease, unspecified: Secondary | ICD-10-CM

## 2015-01-09 DIAGNOSIS — Z951 Presence of aortocoronary bypass graft: Secondary | ICD-10-CM

## 2015-01-09 DIAGNOSIS — I2510 Atherosclerotic heart disease of native coronary artery without angina pectoris: Secondary | ICD-10-CM

## 2015-01-09 DIAGNOSIS — E785 Hyperlipidemia, unspecified: Secondary | ICD-10-CM

## 2015-01-09 DIAGNOSIS — IMO0002 Reserved for concepts with insufficient information to code with codable children: Secondary | ICD-10-CM

## 2015-01-09 NOTE — Assessment & Plan Note (Signed)
Continue statin. 

## 2015-01-09 NOTE — Assessment & Plan Note (Signed)
She's been referred for nutritional therapy. The registered dietitian is also a diabetes educator so she can help with this as well.

## 2015-01-09 NOTE — Progress Notes (Signed)
Patient ID: Cheryl Gill, female   DOB: Jul 02, 1951, 64 y.o.   MRN: [id number]    Date:  01/09/2015   ID:  Cheryl Gill, DOB Jul 02, 1951, MRN [id number]  PCP:  Dorrene GermanAVBUERE,[name], MD  Primary Cardiologist:  Herbie BaltimoreHarding  Chief Complaint  Patient presents with  . Follow-up    post CABG 12/21/14     History of Present Illness: Cheryl Gill is a 64 y.o. female with history of HTN, DM (10-15 years), HLD, seasonal allergies, asthma, CKD stage III by labs, and no prior cardiac history [facility].  Patient underwent left heart catheterization revealing single vessel obstructive disease involving the ostium and mid LAD.  She underwent coronary artery bypass grafting 2 with a LIMA to the LAD and SVG to the first diagonal.  2-D echocardiogram prior to the surgery revealed an ejection fraction of 60-65%. Wall motion was normal. Grade 1 diastolic dysfunction.  Patient presented today for posthospital follow-up.  She reports no complaints. She is sleeping in a recliner because it's there is some mild chest discomfort when she is laying completely down. Otherwise she feels her sternal wound is healing nicely. She denies nausea, vomiting, fever, chest pain, shortness of breath, orthopnea, dizziness, PND, cough, congestion, abdominal pain, hematochezia, melena, lower extremity edema.  Wt Readings from Last 3 Encounters:  01/09/15 190 lb 12.8 oz (86.546 kg)  12/27/14 192 lb 0.3 oz (87.1 kg)  07/17/14 195 lb (88.451 kg)     Past Medical History  Diagnosis Date  . Diabetes mellitus   . Hypertension   . Hypercholesterolemia   . Seasonal allergies   . CKD (chronic kidney disease), stage III   . Asthma   . Obesity     Current Outpatient Prescriptions  Medication Sig Dispense Refill  . albuterol (PROVENTIL HFA;VENTOLIN HFA) 108 (90 BASE) MCG/ACT inhaler Inhale 2 puffs into the lungs every 6 (six) hours as needed. For shortness of breath or wheezing    .  aspirin EC 325 MG EC tablet Take 1 tablet (325 mg total) by mouth daily. 30 tablet 0  . colchicine 0.6 MG tablet Take 0.6 mg by mouth 2 (two) times daily as needed (gout).     . Fluticasone-Salmeterol (ADVAIR) 250-50 MCG/DOSE AEPB Inhale 1 puff into the lungs daily as needed (for shortness of breath).    [name] Kitchen. glipiZIDE (GLUCOTROL) 10 MG tablet Take 10 mg by mouth 2 (two) times daily before a meal.    . guaiFENesin-dextromethorphan (ROBITUSSIN DM) 100-10 MG/5ML syrup Take 15 mLs by mouth every 4 (four) hours as needed for cough. 118 mL 0  . hydrochlorothiazide (HYDRODIURIL) 25 MG tablet Take 25 mg by mouth daily.    [name] Kitchen. ipratropium (ATROVENT) 0.06 % nasal spray Place 2 sprays into both nostrils 4 (four) times daily. 15 mL 1  . linagliptin (TRADJENTA) 5 MG TABS tablet Take 5 mg by mouth daily.    . metoprolol tartrate (LOPRESSOR) 25 MG tablet Take 1 tablet (25 mg total) by mouth 2 (two) times daily. 60 tablet 3  . oxyCODONE (OXY IR/ROXICODONE) 5 MG immediate release tablet Take 1-2 tablets (5-10 mg total) by mouth every 3 (three) hours as needed for severe pain. 30 tablet 0  . simvastatin (ZOCOR) 10 MG tablet Take 10 mg by mouth at bedtime.    . traMADol (ULTRAM) 50 MG tablet Take 1-2 tablets (50-100 mg total) by mouth every 4 (four) hours as needed for moderate pain. 30 tablet 0  No current facility-administered medications for this visit.    Allergies:    Allergies  Allergen Reactions  . Ace Inhibitors Anaphylaxis  . Lisinopril Anaphylaxis  . Mushroom Extract Complex Swelling  . Tetanus Toxoids Nausea And Vomiting and Swelling    Social History:  The patient  reports that she has never smoked. She has never used smokeless tobacco. She reports that she does not drink alcohol or use illicit drugs.   Family history:   Family History  Problem Relation Age of Onset  . Pancreatic cancer Mother   . Stroke Father   . CAD Mother     Open heart surgery 60s-70s  . Heart disease Sister     Unclear  details    ROS:  Please see the history of present illness.  All other systems reviewed and negative.   PHYSICAL EXAM: VS:  BP 108/70 mmHg  Pulse 84  Ht  (1.575 m)  Wt 190 lb 12.8 oz (86.546 kg)  BMI 34.89 kg/m2 Obese, well developed, in no acute distress HEENT: Pupils are equal round react to light accommodation extraocular movements are intact.  Neck: no JVDNo cervical lymphadenopathy. Cardiac: Regular rate and rhythm without murmurs rubs or gallops. MS: Sternal wound is healing nicely. There is no erythema or ecchymosis drainage or edema. Lungs:  clear to auscultation bilaterally, no wheezing, rhonchi or rales Abd: soft, nontender, positive bowel sounds all quadrants, no hepatosplenomegaly Ext: no lower extremity edema.  2+ radial and dorsalis pedis pulses. Skin: warm and dry Neuro:  Grossly normal  EKG: Normal sinus rhythm rate 84 bpm lateral and septal T-wave abnormalities.   ASSESSMENT AND PLAN:  Problem List Items Addressed This Visit    S/P CABG x 2    Her sternal wound is healing nicely. There are no signs of infection. She continues to have some mild soreness in her chest because of this she is sleeping in a recliner. There are no signs of volume overload.      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 She'll be referred to a registered dietitian for assistance and weight loss.      HLD (hyperlipidemia) (Chronic)    Continue statin.      Essential hypertension (Chronic)    Blood pressure well-controlled.  No Changes.      Diabetes mellitus type 2, uncontrolled (Chronic)    She's been referred for nutritional therapy. The registered dietitian is also a diabetes educator so she can help with this as well.       Other Visit Diagnoses    Coronary artery disease due to lipid rich plaque    -  Primary    Relevant Orders    EKG 12-Lead

## 2015-01-09 NOTE — Assessment & Plan Note (Signed)
Her sternal wound is healing nicely. There are no signs of infection. She continues to have some mild soreness in her chest because of this she is sleeping in a recliner. There are no signs of volume overload.

## 2015-01-09 NOTE — Assessment & Plan Note (Signed)
Patient asked for nutritional resources. She'll be referred to a registered dietitian for assistance and weight loss.

## 2015-01-09 NOTE — Assessment & Plan Note (Addendum)
Blood pressure well-controlled.  No Changes.

## 2015-01-09 NOTE — Patient Instructions (Signed)
Your physician recommends that you schedule a follow-up appointment in: 1 month with Dr.Harding.

## 2015-01-18 ENCOUNTER — Other Ambulatory Visit: Payer: Self-pay | Admitting: Thoracic Surgery (Cardiothoracic Vascular Surgery)

## 2015-01-18 DIAGNOSIS — Z951 Presence of aortocoronary bypass graft: Secondary | ICD-10-CM

## 2015-01-22 ENCOUNTER — Ambulatory Visit (INDEPENDENT_AMBULATORY_CARE_PROVIDER_SITE_OTHER): Payer: Self-pay | Admitting: Thoracic Surgery (Cardiothoracic Vascular Surgery)

## 2015-01-22 ENCOUNTER — Encounter: Payer: Self-pay | Admitting: Thoracic Surgery (Cardiothoracic Vascular Surgery)

## 2015-01-22 ENCOUNTER — Ambulatory Visit
Admission: RE | Admit: 2015-01-22 | Discharge: 2015-01-22 | Disposition: A | Discharge status: Home / Self Care | Payer: Medicare Other | Source: Ambulatory Visit | Attending: Thoracic Surgery (Cardiothoracic Vascular Surgery) | Admitting: Thoracic Surgery (Cardiothoracic Vascular Surgery)

## 2015-01-22 VITALS — BP 106/73 | HR 90 | Resp 20 | Ht 62.0 in | Wt 188.0 lb

## 2015-01-22 DIAGNOSIS — Z951 Presence of aortocoronary bypass graft: Secondary | ICD-10-CM

## 2015-01-22 NOTE — Progress Notes (Signed)
HPI:  [name] returns today for scheduled postoperative follow-up visit.  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 She had severe single-vessel disease at the ostium of her LAD and diagonal. She underwent coronary bypass grafting 2 on January 8. She has baseline chronic kidney disease and perioperatively she had acute renal insufficiency. Her creatinine was stable time of discharge.  She says that she's feeling well. She is not having much pain. She says that she will take a pain pill every 3 or 4 days to help her sleep, but otherwise does not need it. She has not had any recurrent angina or shortness of breath. She denies peripheral edema.  Past Medical History  Diagnosis Date  . Diabetes mellitus   . Hypertension   . Hypercholesterolemia   . Seasonal allergies   . CKD (chronic kidney disease), stage III   . Asthma   . Obesity    Past Surgical History  Procedure Laterality Date  . Abdominal hysterectomy    . Tonsillectomy    . Left heart catheterization with coronary angiogram N/A 12/18/2014    Procedure: LEFT HEART CATHETERIZATION WITH CORONARY ANGIOGRAM;  Surgeon: Peter M [name], MD;  [facility];  Service: Cardiovascular;  Laterality: N/A;  . Coronary artery bypass graft N/A 12/21/2014    Procedure: CORONARY ARTERY BYPASS GRAFTING (CABG) TIMES TWO USING LEFT INTERNAL MAMMARY AND RIGHT SAPHENOUS LEG VEIN HARVESTED ENDOSCOPICALLY;  Surgeon: Loreli SlotSteven C Austan Nicholl, MD;  [facility];  Service: Open Heart Surgery;  Laterality: N/A;  . Tee without cardioversion N/A 12/21/2014    Procedure: TRANSESOPHAGEAL ECHOCARDIOGRAM (TEE);  Surgeon: Loreli SlotSteven C Deshunda Thackston, MD;  [facility];  Service: Open Heart Surgery;  Laterality: N/A;  . [place] echocardiography  04/09/2010    Possible small patent foramen ovale suspected;trace MR, mild TR.  [name] Kitchen. Nm myocar perf wall motion  04/09/2010   Abnormal study - appears to be a small area of apical infarction. Reversible ischemia is not seen.  \    Current Outpatient Prescriptions  Medication Sig Dispense Refill  . albuterol (PROVENTIL HFA;VENTOLIN HFA) 108 (90 BASE) MCG/ACT inhaler Inhale 2 puffs into the lungs every 6 (six) hours as needed. For shortness of breath or wheezing    . aspirin EC 325 MG EC tablet Take 1 tablet (325 mg total) by mouth daily. 30 tablet 0  . colchicine 0.6 MG tablet Take 0.6 mg by mouth 2 (two) times daily as needed (gout).     . Fluticasone-Salmeterol (ADVAIR) 250-50 MCG/DOSE AEPB Inhale 1 puff into the lungs daily as needed (for shortness of breath).    [name] Kitchen. glipiZIDE (GLUCOTROL) 10 MG tablet Take 10 mg by mouth 2 (two) times daily before a meal.    . hydrochlorothiazide (HYDRODIURIL) 25 MG tablet Take 25 mg by mouth daily.    [name] Kitchen. ipratropium (ATROVENT) 0.06 % nasal spray Place 2 sprays into both nostrils 4 (four) times daily. 15 mL 1  . linagliptin (TRADJENTA) 5 MG TABS tablet Take 5 mg by mouth daily.    . metoprolol tartrate (LOPRESSOR) 25 MG tablet Take 1 tablet (25 mg total) by mouth 2 (two) times daily. 60 tablet 3  . oxyCODONE (OXY IR/ROXICODONE) 5 MG immediate release tablet Take 1-2 tablets (5-10 mg total) by mouth every 3 (three) hours as needed for severe pain. 30 tablet 0  . simvastatin (ZOCOR) 10 MG tablet Take 10 mg by mouth  at bedtime.    . traMADol (ULTRAM) 50 MG tablet Take 1-2 tablets (50-100 mg total) by mouth every 4 (four) hours as needed for moderate pain. 30 tablet 0  . guaiFENesin-dextromethorphan (ROBITUSSIN DM) 100-10 MG/5ML syrup Take 15 mLs by mouth every 4 (four) hours as needed for cough. (Patient not taking: Reported on 01/22/2015) 118 mL 0   No current facility-administered medications for this visit.    Physical Exam BP 106/73 mmHg  Pulse 90  Resp 20  Ht  (1.575 m)  Wt 188 lb (85.276 kg)  BMI 34.38 kg/m2  SpO46 73% 64 year old woman in no acute distress Alert and  oriented 3 with no focal deficits Cardiac regular rate and rhythm normal S1 and S2 Lungs slightly diminished at left base, no wheezing Sternum stable, incision clean dry and intact Leg incision healing well, no peripheral edema  Diagnostic Tests: Chest x-ray 01/22/2015 FINDINGS: Mediastinum and hilar structures are normal. Prior CABG. Heart size normal. Interim near complete clearing of the lung bases with mild residual left base subsegmental atelectasis and tiny left pleural effusion. No acute bony abnormality.  IMPRESSION: 1. Interim clearing of lung bases with mild residual left left base subsegmental atelectasis. Tiny left pleural effusion remains. 2. Prior CABG. Heart size normal  I personally reviewed the chest x-ray and agree with the above findings   Impression: 64 year old woman who is now about a month out from coronary bypass grafting 2 for severe single-vessel disease following a non-Q-wave MI. She is doing well at this time. She has minimal discomfort and her wounds are healing well. She is rarely having to take narcotics for pain.  I think at this point she is safe to begin driving on a limited basis. Appropriate precautions were discussed. She is still not to lift anything over 10 pounds for another 2 weeks. She can gradually increase her upper body activity after that.  Plan:  She will follow-up with Dr. Herbie Baltimore and Dr. Janey Genta  I will be happy to see her back at any time if I can be of any further assistance with her care in the future

## 2015-02-07 ENCOUNTER — Encounter (HOSPITAL_COMMUNITY)
Admission: RE | Admit: 2015-02-07 | Discharge: 2015-02-07 | Disposition: A | Discharge status: Home / Self Care | Payer: Medicare Other | Source: Ambulatory Visit | Attending: Cardiology | Admitting: Cardiology

## 2015-02-07 NOTE — Progress Notes (Signed)
Cardiac Rehab Medication Review by a Pharmacist  Does the patient  feel that his/her medications are working for him/her?  yes  Has the patient been experiencing any side effects to the medications prescribed?  no  Does the patient measure his/her own blood pressure or blood glucose at home?  yes - 1x per day  Does the patient have any problems obtaining medications due to transportation or finances?   no  Understanding of regimen: good Understanding of indications: good Potential of compliance: good    Pharmacist comments: Pt reports no issues. Misses glipizide occasionally but compliance is good.   Babs BertinHaley Halima Fogal, PharmD Clinical Pharmacist - Resident Pager [phone] 02/07/2015 8:19 AM

## 2015-02-12 ENCOUNTER — Ambulatory Visit (INDEPENDENT_AMBULATORY_CARE_PROVIDER_SITE_OTHER): Payer: Medicare Other | Admitting: Cardiology

## 2015-02-12 VITALS — BP 112/72 | HR 93 | Ht 61.0 in | Wt 190.2 lb

## 2015-02-12 DIAGNOSIS — I10 Essential (primary) hypertension: Secondary | ICD-10-CM

## 2015-02-12 DIAGNOSIS — I249 Acute ischemic heart disease, unspecified: Secondary | ICD-10-CM

## 2015-02-12 DIAGNOSIS — E66812 Obesity, class 2: Secondary | ICD-10-CM

## 2015-02-12 DIAGNOSIS — Z951 Presence of aortocoronary bypass graft: Secondary | ICD-10-CM

## 2015-02-12 DIAGNOSIS — E8881 Metabolic syndrome: Secondary | ICD-10-CM

## 2015-02-12 DIAGNOSIS — I214 Non-ST elevation (NSTEMI) myocardial infarction: Secondary | ICD-10-CM

## 2015-02-12 DIAGNOSIS — E785 Hyperlipidemia, unspecified: Secondary | ICD-10-CM

## 2015-02-12 DIAGNOSIS — I25119 Atherosclerotic heart disease of native coronary artery with unspecified angina pectoris: Secondary | ICD-10-CM

## 2015-02-12 DIAGNOSIS — E669 Obesity, unspecified: Secondary | ICD-10-CM

## 2015-02-12 NOTE — Patient Instructions (Signed)
CONTINUE WITH CURRENT MEDICATIONS   Your physician wants you to follow-up in 3 MONTHS DR HARDING.  You will receive a reminder letter in the mail two months in advance. If you don't receive a letter, please call our office to schedule the follow-up appointment.

## 2015-02-13 ENCOUNTER — Encounter: Payer: Self-pay | Admitting: Cardiology

## 2015-02-13 ENCOUNTER — Encounter (HOSPITAL_COMMUNITY): Payer: Self-pay

## 2015-02-13 ENCOUNTER — Encounter (HOSPITAL_COMMUNITY)
Admission: RE | Admit: 2015-02-13 | Discharge: 2015-02-13 | Disposition: A | Discharge status: Home / Self Care | Payer: Medicare Other | Source: Ambulatory Visit | Attending: Cardiology | Admitting: Cardiology

## 2015-02-13 DIAGNOSIS — I2510 Atherosclerotic heart disease of native coronary artery without angina pectoris: Secondary | ICD-10-CM | POA: Insufficient documentation

## 2015-02-13 DIAGNOSIS — Z951 Presence of aortocoronary bypass graft: Secondary | ICD-10-CM | POA: Diagnosis not present

## 2015-02-13 NOTE — Progress Notes (Signed)
PCP: Dorrene GermanAVBUERE,[name], MD  Clinic Note: Chief Complaint  Patient presents with  . Follow-up    post CABG, pt denied chest pain and SOB   HPI: [name] is a 64 y.o. female with a PMH below who presents today for second cardiology visit following her CABG in January. She has hypertension, type 2 diabetes, hypertension and CK D.-3. She was admitted in early January for chest pain that was thought to be consistent with angina. She underwent cardiac catheterization which revealed severe disease in the proximal LAD that compromised the major diagonal branch. Based on the location of the stenosis and extensive disease, it was felt that she was tested with CABG. She underwent two-vessel CABG with LIMA to LAD and SVG ID 1. She had well preserved EF by echo at the time of grade 1 diastolic function. She was given the okay by Dr. Dorris FetchHendrickson to go back to driving and full activity. She still has some mild musculoskeletal chest discomfort but has been released by surgery..  Past Medical History  Diagnosis Date  . Diabetes mellitus type 2, insulin dependent   . Essential hypertension   . CKD (chronic kidney disease), stage III   . Obesity     BMI ~35-36  . Hyperlipidemia with target LDL less than 70 01/28/2014  . NSTEMI (non-ST elevated myocardial infarction) 12/18/2014    Echo 1/6: EF 60-65%, no [facility] WMA, Gr 1 DD  . 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 Metabolic syndrome: DM, HTN, Obesity as well as HLD 12/18/2014  . Seasonal allergies   . Asthma     Prior Cardiac Evaluation and Past Surgical History: Past Surgical History  Procedure Laterality Date  . Abdominal hysterectomy    . Tonsillectomy    . Left heart catheterization with coronary angiogram N/A 12/18/2014    Procedure: LEFT HEART CATHETERIZATION WITH CORONARY ANGIOGRAM;  Surgeon: Peter M [name], MD;  [facility];  Service:  Cardiovascular;  Laterality: N/A;  . Coronary artery bypass graft N/A 12/21/2014    Procedure: CORONARY ARTERY BYPASS GRAFTING (CABG) TIMES TWO USING LEFT INTERNAL MAMMARY AND RIGHT SAPHENOUS LEG VEIN HARVESTED ENDOSCOPICALLY;  Surgeon: Loreli SlotSteven C Hendrickson, MD;  [facility];  Service: Open Heart Surgery;  Laterality: N/A;  . Tee without cardioversion N/A 12/21/2014    Procedure: TRANSESOPHAGEAL ECHOCARDIOGRAM (TEE);  Surgeon: Loreli SlotSteven C Hendrickson, MD;  [facility];  Service: Open Heart Surgery;  Laterality: N/A;  . [place] echocardiography  04/09/2010    Possible small patent foramen ovale suspected;trace MR, mild TR.  [name] Kitchen. Nm myocar perf wall motion  04/09/2010    Abnormal study - appears to be a small area of apical infarction. Reversible ischemia is not seen.    Interval History: she presents today feeling quite well without any major complaints times. She is excited to start cardiac rehabilitation on Wednesday. She is now starting to do routine walking and has not noted any chest tightness or pressure beyond some twinges from her suture lines and sternal wires. No anginal symptoms. She's not had any chest tightness/pressure or dyspnea with rest or exertion. No PND, orthopnea or edema. She's not had any rapid/irregular heartbeat/palpitations or syncope/near syncope, TIA /amaurosis fugax.  ROS: A comprehensive was performed. Review of Systems  Constitutional: Negative for malaise/fatigue.  HENT: Negative for nosebleeds.   Eyes: Negative for blurred vision.  Respiratory: Negative  for cough, shortness of breath and wheezing.   Cardiovascular: Negative for claudication.  Gastrointestinal: Negative for heartburn, blood in stool and melena.  Genitourinary: Negative for hematuria.  Musculoskeletal: Positive for joint pain. Negative for falls.       Chest wall discomfort postop  Neurological: Positive for dizziness (Occasionally with different positions.). Negative for headaches.  Endo/Heme/Allergies:  Negative.   Psychiatric/Behavioral: Negative.   All other systems reviewed and are negative.   Current Outpatient Prescriptions on File Prior to Visit  Medication Sig Dispense Refill  . albuterol (PROVENTIL HFA;VENTOLIN HFA) 108 (90 BASE) MCG/ACT inhaler Inhale 2 puffs into the lungs every 6 (six) hours as needed. For shortness of breath or wheezing    . aspirin EC 325 MG EC tablet Take 1 tablet (325 mg total) by mouth daily. 30 tablet 0  . colchicine 0.6 MG tablet Take 0.6 mg by mouth 2 (two) times daily as needed (gout).     [name] Kitchen glipiZIDE (GLUCOTROL) 10 MG tablet Take 10 mg by mouth 2 (two) times daily before a meal.    . hydrochlorothiazide (HYDRODIURIL) 25 MG tablet Take 25 mg by mouth daily.    . insulin detemir (LEVEMIR) 100 UNIT/ML injection Inject 10 Units into the skin at bedtime.    [name] Kitchen linagliptin (TRADJENTA) 5 MG TABS tablet Take 5 mg by mouth daily.    . metoprolol tartrate (LOPRESSOR) 25 MG tablet Take 1 tablet (25 mg total) by mouth 2 (two) times daily. 60 tablet 3  . oxyCODONE (OXY IR/ROXICODONE) 5 MG immediate release tablet Take 1-2 tablets (5-10 mg total) by mouth every 3 (three) hours as needed for severe pain. 30 tablet 0  . simvastatin (ZOCOR) 10 MG tablet Take 10 mg by mouth at bedtime.    . traMADol (ULTRAM) 50 MG tablet Take 1-2 tablets (50-100 mg total) by mouth every 4 (four) hours as needed for moderate pain. 30 tablet 0   No current facility-administered medications on file prior to visit.   Allergies  Allergen Reactions  . Ace Inhibitors Anaphylaxis  . Lisinopril Anaphylaxis  . Mushroom Extract Complex Swelling  . Tetanus Toxoids Nausea And Vomiting and Swelling   SOCIAL AND FAMILY HISTORY REVIEWED IN EPIC -- No change  Wt Readings from Last 3 Encounters:  02/12/15 190 lb 3.2 oz (86.274 kg)  01/22/15 188 lb (85.276 kg)  01/09/15 190 lb 12.8 oz (86.546 kg)    PHYSICAL EXAM BP 112/72 mmHg  Pulse 93  Ht  (1.549 m)  Wt 190 lb 3.2 oz (86.274 kg)  BMI  35.96 kg/m2 General appearance: alert, cooperative, appears stated age, no distress and moderately obese Neck: no adenopathy, no carotid bruit and no JVD Lungs: clear to auscultation bilaterally, normal percussion bilaterally and non-labored Heart: regular rate and rhythm, S1, S2 normal, no murmur, click, rub or gallop; non-displaced PMI; well healed sternotomy scar with mild tenderness to palpation along sternal wires. Abdomen: soft, non-tender; bowel sounds normal; no masses,  no organomegaly; No HJR Extremities: extremities normal, atraumatic, no cyanosis or edema. Pulses: 2+ and symmetric; Skin: normal and mobility and turgor normal  Neurologic: Mental status: Alert, oriented, thought content appropriate; Cranial nerves: normal (II-XII grossly intact)   Adult ECG Report  Rate: 93 ;  Rhythm: normal sinus rhythm and Borderline low voltage. Lateral T wave inversions  Narrative Interpretation: stable EKG. There still are T wave inversions in leads 1 and aVL consistent with initial pre-and postop EKGs  Recent Labs:     Chemistry  Component Value Date/Time   NA 140 12/27/2014 0530   K 4.9 12/27/2014 0530   CL 97 12/27/2014 0530   CO2 27 12/27/2014 0530   BUN 30* 12/27/2014 0530   CREATININE 1.75* 12/27/2014 0530      Component Value Date/Time   CALCIUM 8.7 12/27/2014 0530   ALKPHOS 77 12/21/2014 0001   AST 65* 12/21/2014 0001   ALT 42* 12/21/2014 0001   BILITOT 0.4 12/21/2014 0001     Lab Results  Component Value Date   CHOL 110 12/18/2014   HDL 37* 12/18/2014   LDLCALC 62 12/18/2014   TRIG 56 12/18/2014   CHOLHDL 3.0 12/18/2014    ASSESSMENT / PLAN: NSTEMI (non-ST elevated myocardial infarction) Ejection Fraction by echo looked to be exerted on the day following her catheterization. No particular symptoms following CABG. Cardiac rehabilitation to begin this week.   Ostial LAD disease Significant ostial and mid LAD disease on. Referred for CABG. She is on aspirin,  beta blocker,and simvastatin  For now she seems stable.   S/P CABG x 2 She seems to be healing well from her surgery. Has now been released to drive and is pretty much ready for full activity.   Essential hypertension Well-controlled on current medications. If her pressures increase, would consider converting her HCTZ to an ACE inhibitor or ARB based on her CK D. And diabetes.   Hyperlipidemia with target LDL less than 70 Labs checked at the time of her MI but very well controlled. Similar to last year. Continue statin as she had an MI despite a "goal level of LDL " We will check a NMR panel in 6 months.   Obesity, Class II, BMI 35-39.9 The patient understands the need to lose weight with diet and exercise. We have discussed specific strategies for this. Hopefully the nutrition counseling portion of her cardiac rehabilitation as well as exercise will help with weight loss. She does seem motivated.   Metabolic syndrome: DM, HTN, Obesity as well as HLD HDL levels are improving. Hopefully she will be able to maintain some weight loss, and her blood pressures are running stable.     Orders Placed This Encounter  Procedures  . EKG 12-Lead   No orders of the defined types were placed in this encounter.     Followup: 3 months   HARDING, Piedad Climes, M.D., M.S. Interventional Cardiologist   Pager # [phone]

## 2015-02-13 NOTE — Assessment & Plan Note (Addendum)
Ejection Fraction by echo looked to be exerted on the day following her catheterization. No particular symptoms following CABG. Cardiac rehabilitation to begin this week.

## 2015-02-13 NOTE — Assessment & Plan Note (Signed)
Significant ostial and mid LAD disease on. Referred for CABG. She is on aspirin, beta blocker,and simvastatin  For now she seems stable.

## 2015-02-13 NOTE — Assessment & Plan Note (Signed)
Labs checked at the time of her MI but very well controlled. Similar to last year. Continue statin as she had an MI despite a "goal level of LDL " We will check a NMR panel in 6 months.

## 2015-02-13 NOTE — Assessment & Plan Note (Signed)
She seems to be healing well from her surgery. Has now been released to drive and is pretty much ready for full activity.

## 2015-02-13 NOTE — Assessment & Plan Note (Signed)
Well-controlled on current medications. If her pressures increase, would consider converting her HCTZ to an ACE inhibitor or ARB based on her CK D. And diabetes.

## 2015-02-13 NOTE — Assessment & Plan Note (Signed)
The patient understands the need to lose weight with diet and exercise. We have discussed specific strategies for this. Hopefully the nutrition counseling portion of her cardiac rehabilitation as well as exercise will help with weight loss. She does seem motivated.

## 2015-02-13 NOTE — Assessment & Plan Note (Signed)
HDL levels are improving. Hopefully she will be able to maintain some weight loss, and her blood pressures are running stable.

## 2015-02-13 NOTE — Progress Notes (Signed)
Pt [id number]-[id number] upon arrival to cardiac rehab.  Pt did not exercise.  Pt met with Derek Mound, RD, certified diabetes educator.  Med list reconciled.  PHQ-0.  Will continue to monitor.

## 2015-02-13 NOTE — Progress Notes (Signed)
Nutrition Note Spoke with pt. Pre-exercise CBG 317 mg/dL. Pt unable to exercise per policy. Pt reports she ate a cinnamon raisin bagel with smart balance and a boiled egg at 11:30 am. Pt states she usually eats a whole wheat English muffin with Smart Balance and a banana. Pt encouraged to use peanut butter or egg whites/egg beaters instead of margarine on her English muffin. Healthy pre-exercise meals discussed. Pt expressed understanding. Continue client-centered nutrition education by RD as part of interdisciplinary care.  Monitor and evaluate progress toward nutrition goal with team.  Mickle PlumbEdna Salvadore Valvano, M.Ed, RD, LDN, CDE 02/13/2015 1:56 PM

## 2015-02-14 LAB — GLUCOSE, CAPILLARY: Glucose-Capillary: 317 mg/dL — ABNORMAL HIGH (ref 70–99)

## 2015-02-15 ENCOUNTER — Encounter (HOSPITAL_COMMUNITY)
Admission: RE | Admit: 2015-02-15 | Discharge: 2015-02-15 | Disposition: A | Discharge status: Home / Self Care | Payer: Medicare Other | Source: Ambulatory Visit | Attending: Cardiology | Admitting: Cardiology

## 2015-02-15 DIAGNOSIS — I2510 Atherosclerotic heart disease of native coronary artery without angina pectoris: Secondary | ICD-10-CM | POA: Diagnosis not present

## 2015-02-15 LAB — GLUCOSE, CAPILLARY: Glucose-Capillary: 230 mg/dL — ABNORMAL HIGH (ref 70–99)

## 2015-02-15 NOTE — Progress Notes (Signed)
Pt started cardiac rehab today.  Pt tolerated light exercise without difficulty.  VSS, telemetry-sinus rhythm with twave inversion.  Asymptomatic.  Pt does not exhibit barriers to rehab participation.  Pt has good coping skills, positive outlook and supportive family.  Pt does check her blood sugar daily at home and made better attempt at eating appropriate pre exercise meal. Pt oriented to exercise equipment and routine.  Understanding verbalized.

## 2015-02-18 ENCOUNTER — Encounter (HOSPITAL_COMMUNITY)
Admission: RE | Admit: 2015-02-18 | Discharge: 2015-02-18 | Disposition: A | Discharge status: Home / Self Care | Payer: Medicare Other | Source: Ambulatory Visit | Attending: Cardiology | Admitting: Cardiology

## 2015-02-18 DIAGNOSIS — I2510 Atherosclerotic heart disease of native coronary artery without angina pectoris: Secondary | ICD-10-CM | POA: Diagnosis not present

## 2015-02-18 LAB — GLUCOSE, CAPILLARY
GLUCOSE-CAPILLARY: 242 mg/dL — AB (ref 70–99)
GLUCOSE-CAPILLARY: 175 mg/dL — AB (ref 70–99)

## 2015-02-20 ENCOUNTER — Encounter (HOSPITAL_COMMUNITY)
Admission: RE | Admit: 2015-02-20 | Discharge: 2015-02-20 | Disposition: A | Discharge status: Home / Self Care | Payer: Medicare Other | Source: Ambulatory Visit | Attending: Cardiology | Admitting: Cardiology

## 2015-02-20 DIAGNOSIS — I2510 Atherosclerotic heart disease of native coronary artery without angina pectoris: Secondary | ICD-10-CM | POA: Diagnosis not present

## 2015-02-21 LAB — GLUCOSE, CAPILLARY
Glucose-Capillary: 271 mg/dL — ABNORMAL HIGH (ref 70–99)
Glucose-Capillary: 294 mg/dL — ABNORMAL HIGH (ref 70–99)

## 2015-02-22 ENCOUNTER — Encounter (HOSPITAL_COMMUNITY)
Admission: RE | Admit: 2015-02-22 | Discharge: 2015-02-22 | Disposition: A | Discharge status: Home / Self Care | Payer: Medicare Other | Source: Ambulatory Visit | Attending: Cardiology | Admitting: Cardiology

## 2015-02-22 DIAGNOSIS — I2510 Atherosclerotic heart disease of native coronary artery without angina pectoris: Secondary | ICD-10-CM | POA: Diagnosis not present

## 2015-02-22 LAB — GLUCOSE, CAPILLARY
GLUCOSE-CAPILLARY: 224 mg/dL — AB (ref 70–99)
GLUCOSE-CAPILLARY: 168 mg/dL — AB (ref 70–99)

## 2015-02-25 ENCOUNTER — Encounter (HOSPITAL_COMMUNITY)
Admission: RE | Admit: 2015-02-25 | Discharge: 2015-02-25 | Disposition: A | Discharge status: Home / Self Care | Payer: Medicare Other | Source: Ambulatory Visit | Attending: Cardiology | Admitting: Cardiology

## 2015-02-25 DIAGNOSIS — I2510 Atherosclerotic heart disease of native coronary artery without angina pectoris: Secondary | ICD-10-CM | POA: Diagnosis not present

## 2015-02-25 LAB — GLUCOSE, CAPILLARY: Glucose-Capillary: 272 mg/dL — ABNORMAL HIGH (ref 70–99)

## 2015-02-27 ENCOUNTER — Encounter (HOSPITAL_COMMUNITY)
Admission: RE | Admit: 2015-02-27 | Discharge: 2015-02-27 | Disposition: A | Discharge status: Home / Self Care | Payer: Medicare Other | Source: Ambulatory Visit | Attending: Cardiology | Admitting: Cardiology

## 2015-02-27 DIAGNOSIS — I2510 Atherosclerotic heart disease of native coronary artery without angina pectoris: Secondary | ICD-10-CM | POA: Diagnosis not present

## 2015-02-27 LAB — GLUCOSE, CAPILLARY: Glucose-Capillary: 237 mg/dL — ABNORMAL HIGH (ref 70–99)

## 2015-02-27 NOTE — Progress Notes (Signed)
(  late entry for 02/25/2015)  PSYCHOSOCIAL ASSESSMENT  Pt psychosocial assessment reveals no barriers to rehab participation.  Pt quality of life is slightly altered by her physical constraints which limits her ability to perform tasks as prior to her illness, although pt reports symptoms of fatigue and dyspnea are improving.  Pt exhibits positive coping skills and has supportive family, pt daughter helps her with activities and driving. Pt admits this role reversal has been difficult for her and unexpected for her at her age.   Offered emotional support and reassurance.  Will continue to monitor.

## 2015-02-27 NOTE — Progress Notes (Signed)
Reviewed home exercise guidelines with patient including endpoints, temperature precautions, target heart rate and rate of perceived exertion. Pt is walking 20 minutes 2 days/week in the house as her mode of home exercise. Discussed increasing duration to 30 minutes and adding warm-up and cool-down at home. Pt voices understanding of instructions given.

## 2015-03-01 ENCOUNTER — Encounter (HOSPITAL_COMMUNITY)
Admission: RE | Admit: 2015-03-01 | Discharge: 2015-03-01 | Disposition: A | Discharge status: Home / Self Care | Payer: Medicare Other | Source: Ambulatory Visit | Attending: Cardiology | Admitting: Cardiology

## 2015-03-01 DIAGNOSIS — I2510 Atherosclerotic heart disease of native coronary artery without angina pectoris: Secondary | ICD-10-CM | POA: Diagnosis not present

## 2015-03-01 LAB — GLUCOSE, CAPILLARY: GLUCOSE-CAPILLARY: 217 mg/dL — AB (ref 70–99)

## 2015-03-04 ENCOUNTER — Encounter (HOSPITAL_COMMUNITY)
Admission: RE | Admit: 2015-03-04 | Discharge: 2015-03-04 | Disposition: A | Discharge status: Home / Self Care | Payer: Medicare Other | Source: Ambulatory Visit | Attending: Cardiology | Admitting: Cardiology

## 2015-03-04 ENCOUNTER — Inpatient Hospital Stay (HOSPITAL_COMMUNITY)
Admission: EM | Admit: 2015-03-04 | Discharge: 2015-03-08 | DRG: 392 | Disposition: A | Discharge status: Home / Self Care | Payer: Medicare Other | Attending: Internal Medicine | Admitting: Internal Medicine

## 2015-03-04 ENCOUNTER — Encounter (HOSPITAL_COMMUNITY): Payer: Self-pay | Admitting: *Deleted

## 2015-03-04 DIAGNOSIS — Z794 Long term (current) use of insulin: Secondary | ICD-10-CM | POA: Diagnosis not present

## 2015-03-04 DIAGNOSIS — Z90710 Acquired absence of both cervix and uterus: Secondary | ICD-10-CM

## 2015-03-04 DIAGNOSIS — I2510 Atherosclerotic heart disease of native coronary artery without angina pectoris: Secondary | ICD-10-CM | POA: Diagnosis not present

## 2015-03-04 DIAGNOSIS — E1165 Type 2 diabetes mellitus with hyperglycemia: Secondary | ICD-10-CM | POA: Diagnosis not present

## 2015-03-04 DIAGNOSIS — Z91018 Allergy to other foods: Secondary | ICD-10-CM

## 2015-03-04 DIAGNOSIS — Z7982 Long term (current) use of aspirin: Secondary | ICD-10-CM | POA: Diagnosis not present

## 2015-03-04 DIAGNOSIS — K529 Noninfective gastroenteritis and colitis, unspecified: Principal | ICD-10-CM | POA: Diagnosis present

## 2015-03-04 DIAGNOSIS — N183 Chronic kidney disease, stage 3 (moderate): Secondary | ICD-10-CM | POA: Diagnosis present

## 2015-03-04 DIAGNOSIS — Z6835 Body mass index (BMI) 35.0-35.9, adult: Secondary | ICD-10-CM

## 2015-03-04 DIAGNOSIS — J45909 Unspecified asthma, uncomplicated: Secondary | ICD-10-CM | POA: Diagnosis present

## 2015-03-04 DIAGNOSIS — Z951 Presence of aortocoronary bypass graft: Secondary | ICD-10-CM | POA: Diagnosis not present

## 2015-03-04 DIAGNOSIS — E8881 Metabolic syndrome: Secondary | ICD-10-CM | POA: Diagnosis not present

## 2015-03-04 DIAGNOSIS — E669 Obesity, unspecified: Secondary | ICD-10-CM | POA: Diagnosis present

## 2015-03-04 DIAGNOSIS — I252 Old myocardial infarction: Secondary | ICD-10-CM | POA: Diagnosis not present

## 2015-03-04 DIAGNOSIS — R1030 Lower abdominal pain, unspecified: Secondary | ICD-10-CM

## 2015-03-04 DIAGNOSIS — M109 Gout, unspecified: Secondary | ICD-10-CM | POA: Diagnosis not present

## 2015-03-04 DIAGNOSIS — E785 Hyperlipidemia, unspecified: Secondary | ICD-10-CM | POA: Diagnosis not present

## 2015-03-04 DIAGNOSIS — Z888 Allergy status to other drugs, medicaments and biological substances status: Secondary | ICD-10-CM | POA: Diagnosis not present

## 2015-03-04 DIAGNOSIS — E1169 Type 2 diabetes mellitus with other specified complication: Secondary | ICD-10-CM | POA: Diagnosis present

## 2015-03-04 DIAGNOSIS — E66812 Obesity, class 2: Secondary | ICD-10-CM | POA: Diagnosis present

## 2015-03-04 DIAGNOSIS — I129 Hypertensive chronic kidney disease with stage 1 through stage 4 chronic kidney disease, or unspecified chronic kidney disease: Secondary | ICD-10-CM | POA: Diagnosis present

## 2015-03-04 DIAGNOSIS — IMO0002 Reserved for concepts with insufficient information to code with codable children: Secondary | ICD-10-CM | POA: Diagnosis present

## 2015-03-04 DIAGNOSIS — K921 Melena: Secondary | ICD-10-CM | POA: Diagnosis present

## 2015-03-04 DIAGNOSIS — R109 Unspecified abdominal pain: Secondary | ICD-10-CM | POA: Diagnosis present

## 2015-03-04 DIAGNOSIS — I10 Essential (primary) hypertension: Secondary | ICD-10-CM | POA: Diagnosis present

## 2015-03-04 DIAGNOSIS — R197 Diarrhea, unspecified: Secondary | ICD-10-CM | POA: Diagnosis not present

## 2015-03-04 DIAGNOSIS — J302 Other seasonal allergic rhinitis: Secondary | ICD-10-CM | POA: Diagnosis present

## 2015-03-04 HISTORY — DX: Noninfective gastroenteritis and colitis, unspecified: K52.9

## 2015-03-04 LAB — LIPASE, BLOOD: Lipase: 34 U/L (ref 11–59)

## 2015-03-04 LAB — CBC WITH DIFFERENTIAL/PLATELET
BASOS ABS: 0 10*3/uL (ref 0.0–0.1)
Basophils Relative: 0 % (ref 0–1)
Eosinophils Absolute: 0.3 10*3/uL (ref 0.0–0.7)
Eosinophils Relative: 2 % (ref 0–5)
HCT: 36.9 % (ref 36.0–46.0)
HEMOGLOBIN: 12.5 g/dL (ref 12.0–15.0)
Lymphocytes Relative: 27 % (ref 12–46)
Lymphs Abs: 3.5 10*3/uL (ref 0.7–4.0)
MCH: 31.3 pg (ref 26.0–34.0)
MCHC: 33.9 g/dL (ref 30.0–36.0)
MCV: 92.3 fL (ref 78.0–100.0)
Monocytes Absolute: 0.9 10*3/uL (ref 0.1–1.0)
Monocytes Relative: 7 % (ref 3–12)
NEUTROS PCT: 64 % (ref 43–77)
Neutro Abs: 8.4 10*3/uL — ABNORMAL HIGH (ref 1.7–7.7)
PLATELETS: 310 10*3/uL (ref 150–400)
RBC: 4 MIL/uL (ref 3.87–5.11)
RDW: 13.2 % (ref 11.5–15.5)
WBC: 13.1 10*3/uL — ABNORMAL HIGH (ref 4.0–10.5)

## 2015-03-04 LAB — URINALYSIS, ROUTINE W REFLEX MICROSCOPIC
Bilirubin Urine: NEGATIVE
Glucose, UA: NEGATIVE mg/dL
Hgb urine dipstick: NEGATIVE
Ketones, ur: NEGATIVE mg/dL
Leukocytes, UA: NEGATIVE
Nitrite: NEGATIVE
Protein, ur: NEGATIVE mg/dL
Specific Gravity, Urine: 1.016 (ref 1.005–1.030)
Urobilinogen, UA: 0.2 mg/dL (ref 0.0–1.0)
pH: 5 (ref 5.0–8.0)

## 2015-03-04 LAB — COMPREHENSIVE METABOLIC PANEL
ALBUMIN: 3.6 g/dL (ref 3.5–5.2)
ALK PHOS: 82 U/L (ref 39–117)
ALT: 20 U/L (ref 0–35)
ANION GAP: 8 (ref 5–15)
AST: 21 U/L (ref 0–37)
BUN: 17 mg/dL (ref 6–23)
CALCIUM: 9.1 mg/dL (ref 8.4–10.5)
CO2: 26 mmol/L (ref 19–32)
CREATININE: 1.38 mg/dL — AB (ref 0.50–1.10)
Chloride: 106 mmol/L (ref 96–112)
GFR calc non Af Amer: 39 mL/min — ABNORMAL LOW (ref >90)
GFR, EST AFRICAN AMERICAN: 45 mL/min — AB (ref >90)
GLUCOSE: 221 mg/dL — AB (ref 70–99)
POTASSIUM: 3.9 mmol/L (ref 3.5–5.1)
Sodium: 140 mmol/L (ref 135–145)
TOTAL PROTEIN: 7.5 g/dL (ref 6.0–8.3)
Total Bilirubin: 0.5 mg/dL (ref 0.3–1.2)

## 2015-03-04 LAB — GLUCOSE, CAPILLARY: GLUCOSE-CAPILLARY: 206 mg/dL — AB (ref 70–99)

## 2015-03-04 NOTE — ED Provider Notes (Signed)
CSN: [id number]     Arrival date & time 03/04/15  2033 History  This chart was scribed for Cheryl Gill Cheryl Granquist, MD by Richarda Overlieichard Holland, ED Scribe. This patient was seen in room B19C/B19C and the patient's care was started 11:42 PM.    Chief Complaint  Patient presents with  . Abdominal Pain    The history is provided by the patient. No language interpreter was used.   HPI Comments: [name] is a 64 y.o. female with a history of DM, CKD, NSTEMI and asthma who presents to the Emergency Department complaining of intermittent lower abdominal pain for the last 3 days. Pt reports associated diarrhea with approximately 3 episodes of diarrhea daily. She states that her abdominal pain worsens when she stands up. Pt reports that she has had a similar pain before when she had UTIs. She denies any known sick contacts. Pt denies fevers, chills, nausea, vomiting, hematuria or any other urinary symptoms.   Past Medical History  Diagnosis Date  . Diabetes mellitus type 2, insulin dependent   . Essential hypertension   . CKD (chronic kidney disease), stage III   . Obesity     BMI ~35-36  . Hyperlipidemia with target LDL less than 70 01/28/2014  . NSTEMI (non-ST elevated myocardial infarction) 12/18/2014    Echo 1/6: EF 60-65%, no [facility], Gr 1 DD  . 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 Metabolic syndrome: DM, HTN, Obesity as well as HLD 12/18/2014  . Seasonal allergies   . Asthma    Past Surgical History  Procedure Laterality Date  . Abdominal hysterectomy    . Tonsillectomy    . Left heart catheterization with coronary angiogram N/A 12/18/2014    Procedure: LEFT HEART CATHETERIZATION WITH CORONARY ANGIOGRAM;  Surgeon: Peter M [name], MD;  [facility];  Service: Cardiovascular;  Laterality: N/A;  . Coronary artery bypass graft N/A 12/21/2014    Procedure: CORONARY ARTERY BYPASS GRAFTING (CABG) TIMES TWO  USING LEFT INTERNAL MAMMARY AND RIGHT SAPHENOUS LEG VEIN HARVESTED ENDOSCOPICALLY;  Surgeon: Loreli SlotSteven C Hendrickson, MD;  [facility];  Service: Open Heart Surgery;  Laterality: N/A;  . Tee without cardioversion N/A 12/21/2014    Procedure: TRANSESOPHAGEAL ECHOCARDIOGRAM (TEE);  Surgeon: Loreli SlotSteven C Hendrickson, MD;  [facility];  Service: Open Heart Surgery;  Laterality: N/A;  . [place] echocardiography  04/09/2010    Possible small patent foramen ovale suspected;trace MR, mild TR.  [name] Kitchen. Nm myocar perf wall motion  04/09/2010    Abnormal study - appears to be a small area of apical infarction. Reversible ischemia is not seen.   Family History  Problem Relation Age of Onset  . Pancreatic cancer Mother   . Stroke Father   . CAD Mother     Open heart surgery 60s-70s  . Heart disease Sister     Unclear details   History  Substance Use Topics  . Smoking status: Never Smoker   . Smokeless tobacco: Never Used  . Alcohol Use: No   OB History    Gravida Para Term Preterm AB TAB SAB Ectopic Multiple Living   5 4 4  1  1   4      Review of Systems  Constitutional: Negative for fever and chills.  Cardiovascular: Negative for chest pain.  Gastrointestinal: Positive for abdominal pain and diarrhea. Negative for nausea, vomiting, constipation and blood  in stool.  Genitourinary: Negative for dysuria, frequency, hematuria, flank pain, vaginal bleeding, vaginal discharge and difficulty urinating.  Musculoskeletal: Negative for back pain, neck pain and neck stiffness.  Skin: Negative for rash and wound.  Neurological: Negative for dizziness, weakness, light-headedness and numbness.  All other systems reviewed and are negative.     Allergies  Ace inhibitors; Lisinopril; Mushroom extract complex; and Tetanus toxoids  Home Medications   Prior to Admission medications   Medication Sig Start Date End Date Taking? Authorizing Provider  albuterol (PROVENTIL HFA;VENTOLIN HFA) 108 (90 BASE) MCG/ACT  inhaler Inhale 2 puffs into the lungs every 6 (six) hours as needed. For shortness of breath or wheezing   Yes Historical Provider, MD  aspirin EC 325 MG EC tablet Take 1 tablet (325 mg total) by mouth daily. 12/27/14  Yes Erin R Barrett, PA-C  colchicine 0.6 MG tablet Take 0.6 mg by mouth 2 (two) times daily as needed (gout).    Yes Historical Provider, MD  glipiZIDE (GLUCOTROL) 10 MG tablet Take 10 mg by mouth 2 (two) times daily before a meal.   Yes Historical Provider, MD  hydrochlorothiazide (HYDRODIURIL) 25 MG tablet Take 25 mg by mouth daily.   Yes Historical Provider, MD  insulin detemir (LEVEMIR) 100 UNIT/ML injection Inject 10 Units into the skin at bedtime.   Yes Historical Provider, MD  linagliptin (TRADJENTA) 5 MG TABS tablet Take 5 mg by mouth daily.   Yes Historical Provider, MD  loratadine (CLARITIN) 10 MG tablet Take 10 mg by mouth daily as needed for allergies.   Yes Historical Provider, MD  metoprolol tartrate (LOPRESSOR) 25 MG tablet Take 1 tablet (25 mg total) by mouth 2 (two) times daily. 12/27/14  Yes Erin R Barrett, PA-C  oxyCODONE (OXY IR/ROXICODONE) 5 MG immediate release tablet Take 1-2 tablets (5-10 mg total) by mouth every 3 (three) hours as needed for severe pain. 12/27/14  Yes Erin R Barrett, PA-C  simvastatin (ZOCOR) 10 MG tablet Take 10 mg by mouth at bedtime.   Yes Historical Provider, MD  traMADol (ULTRAM) 50 MG tablet Take 1-2 tablets (50-100 mg total) by mouth every 4 (four) hours as needed for moderate pain. 12/27/14  Yes Erin R Barrett, PA-C   BP 100/63 mmHg  Pulse 67  Temp(Src) 98 F (36.7 C) (Oral)  Resp 11  Ht  (1.549 m)  Wt 186 lb (84.369 kg)  BMI 35.16 kg/m2  SpO2 95% Physical Exam  Constitutional: She is oriented to person, place, and time. She appears well-developed and well-nourished. No distress.  HENT:  Head: Normocephalic and atraumatic.  Mouth/Throat: Oropharynx is clear and moist.  Eyes: EOM are normal. Pupils are equal, round, and  reactive to light. Right eye exhibits no discharge. Left eye exhibits no discharge.  Neck: Normal range of motion. Neck supple. No tracheal deviation present.  Cardiovascular: Normal rate and regular rhythm.   Pulmonary/Chest: Effort normal and breath sounds normal. No respiratory distress. She has no wheezes. She has no rales.  Abdominal: Soft. Bowel sounds are normal. She exhibits no distension and no mass. There is tenderness (they're mild tenderness to palpation in the suprapubic region.). There is no rebound and no guarding.  Musculoskeletal: Normal range of motion. She exhibits no edema or tenderness.  No CVA tenderness bilaterally.  Neurological: She is alert and oriented to person, place, and time.  Skin: Skin is warm and dry. No rash noted. No erythema.  Psychiatric: She has a normal mood and affect. Her behavior  is normal.  Nursing note and vitals reviewed.   ED Course  Procedures   DIAGNOSTIC STUDIES: Oxygen Saration is 100% on RA, normal by my interpretation.    COORDINATION OF CARE: 11:44 PM Discussed treatment plan with pt at bedside and pt agreed to plan.  Labs Review Labs Reviewed  CBC WITH DIFFERENTIAL/PLATELET - Abnormal; Notable for the following:    WBC 13.1 (*)    Neutro Abs 8.4 (*)    All other components within normal limits  COMPREHENSIVE METABOLIC PANEL - Abnormal; Notable for the following:    Glucose, Bld 221 (*)    Creatinine, Ser 1.38 (*)    GFR calc non Af Amer 39 (*)    GFR calc Af Amer 45 (*)    All other components within normal limits  URINALYSIS, ROUTINE W REFLEX MICROSCOPIC - Abnormal; Notable for the following:    APPearance CLOUDY (*)    All other components within normal limits  CLOSTRIDIUM DIFFICILE BY PCR  LIPASE, BLOOD    Imaging Review Ct Abdomen Pelvis W Contrast  03/05/2015   CLINICAL DATA:  Initial evaluation for acute lower abdominal pain since Friday.  EXAM: CT ABDOMEN AND PELVIS WITH CONTRAST  TECHNIQUE: Multidetector CT  imaging of the abdomen and pelvis was performed using the standard protocol following bolus administration of intravenous contrast.  CONTRAST:  80mL OMNIPAQUE IOHEXOL 300 MG/ML  SOLN  COMPARISON:  Prior radiograph from earlier the same day  FINDINGS: Minimal dependent atelectasis noted within the visualized lung bases. Visualized lungs are otherwise clear.  The liver demonstrates a normal contrast enhanced appearance. Focal fat deposition noted adjacent to the fissure for ligamentum teres. Gallbladder within normal limits. No biliary dilatation. Spleen, adrenal glands, and pancreas demonstrate a normal contrast enhanced appearance.  Kidneys are equal in size with symmetric enhancement. No nephrolithiasis, hydronephrosis, or focal enhancing renal mass.  Stomach within normal limits. Small fat containing paraesophageal hernia noted. No evidence for bowel obstruction. There is wall thickening with mild inflammatory stranding about the ascending and proximal transverse colon, suggesting acute colitis. Changes most evident at the hepatic flexure. No other acute inflammatory changes seen about the bowels. Few scattered colonic diverticula noted. Appendix not discretely identified.  Bladder within normal limits.  Uterus is absent.  Ovaries not discretely identified.  No pathologically enlarged intra-abdominal or pelvic lymph nodes identified. Normal intravascular enhancement seen throughout the abdomen and pelvis. No free air or fluid.  No acute osseous abnormality. No worrisome lytic or blastic osseous lesions.  IMPRESSION: 1. Wall thickening with inflammatory stranding about the ascending and proximal transverse colon, most consistent with acute colitis. No evidence for perforation or other complication identified. 2. No other acute intra-abdominal or pelvic process identified.   Electronically Signed   By: Rise Mu M.D.   On: 03/05/2015 04:51   Dg Abd Acute W/chest  03/05/2015   CLINICAL DATA:  Acute  onset of lower abdominal pain and diarrhea. Initial encounter.  EXAM: ACUTE ABDOMEN SERIES (ABDOMEN 2 VIEW & CHEST 1 VIEW)  COMPARISON:  Chest radiograph performed 01/22/2015, and CT of the abdomen and pelvis performed 01/28/2014  FINDINGS: The lungs are well-aerated and clear. There is no evidence of focal opacification, pleural effusion or pneumothorax. The cardiomediastinal silhouette is borderline normal in size. The patient is status post median sternotomy, with evidence of prior CABG.  The visualized bowel gas pattern is unremarkable. Scattered stool and air are seen within the colon; there is no evidence of small bowel dilatation to suggest  obstruction. No free intra-abdominal air is identified on the provided upright view.  No acute osseous abnormalities are seen; the sacroiliac joints are unremarkable in appearance.  IMPRESSION: 1. Unremarkable bowel gas pattern; no free intra-abdominal air seen. Small to moderate amount of stool noted in the colon. 2. Lungs hypoexpanded but grossly clear.   Electronically Signed   By: Roanna Raider M.D.   On: 03/05/2015 00:34     EKG Interpretation None      MDM   Final diagnoses:  Lower abdominal pain  Colitis    I personally performed the services described in this documentation, which was scribed in my presence. The recorded information has been reviewed and is accurate.   CT with evidence of colitis. Started abx in the emergency department. C. difficile was also sent. Discussed with [facility] hospitalists and will admit to MedSurg bed.    Cheryl Racer, MD 03/05/15 [phone]

## 2015-03-04 NOTE — ED Notes (Signed)
Pt reports lower abdominal pain with diarrhea for three days. Denies n/v

## 2015-03-04 NOTE — ED Notes (Signed)
Pt states she's itchy, has been itchy for "past few days".

## 2015-03-04 NOTE — ED Notes (Signed)
Pt. Reports lower abdominal pain since Friday. Denies N/V. Reports starting having loose stools today x3. Denies vaginal discharge or urinary symptoms.

## 2015-03-05 ENCOUNTER — Encounter (HOSPITAL_COMMUNITY): Payer: Self-pay | Admitting: Radiology

## 2015-03-05 ENCOUNTER — Emergency Department (HOSPITAL_COMMUNITY): Payer: Medicare Other

## 2015-03-05 DIAGNOSIS — I129 Hypertensive chronic kidney disease with stage 1 through stage 4 chronic kidney disease, or unspecified chronic kidney disease: Secondary | ICD-10-CM | POA: Diagnosis present

## 2015-03-05 DIAGNOSIS — N183 Chronic kidney disease, stage 3 (moderate): Secondary | ICD-10-CM | POA: Diagnosis present

## 2015-03-05 DIAGNOSIS — Z6835 Body mass index (BMI) 35.0-35.9, adult: Secondary | ICD-10-CM | POA: Diagnosis not present

## 2015-03-05 DIAGNOSIS — E669 Obesity, unspecified: Secondary | ICD-10-CM

## 2015-03-05 DIAGNOSIS — J4520 Mild intermittent asthma, uncomplicated: Secondary | ICD-10-CM | POA: Diagnosis not present

## 2015-03-05 DIAGNOSIS — R1030 Lower abdominal pain, unspecified: Secondary | ICD-10-CM | POA: Insufficient documentation

## 2015-03-05 DIAGNOSIS — K921 Melena: Secondary | ICD-10-CM | POA: Diagnosis present

## 2015-03-05 DIAGNOSIS — Z794 Long term (current) use of insulin: Secondary | ICD-10-CM | POA: Diagnosis not present

## 2015-03-05 DIAGNOSIS — E1165 Type 2 diabetes mellitus with hyperglycemia: Secondary | ICD-10-CM

## 2015-03-05 DIAGNOSIS — Z951 Presence of aortocoronary bypass graft: Secondary | ICD-10-CM

## 2015-03-05 DIAGNOSIS — J302 Other seasonal allergic rhinitis: Secondary | ICD-10-CM | POA: Diagnosis present

## 2015-03-05 DIAGNOSIS — E785 Hyperlipidemia, unspecified: Secondary | ICD-10-CM

## 2015-03-05 DIAGNOSIS — I10 Essential (primary) hypertension: Secondary | ICD-10-CM

## 2015-03-05 DIAGNOSIS — R197 Diarrhea, unspecified: Secondary | ICD-10-CM | POA: Diagnosis present

## 2015-03-05 DIAGNOSIS — I252 Old myocardial infarction: Secondary | ICD-10-CM | POA: Diagnosis not present

## 2015-03-05 DIAGNOSIS — K529 Noninfective gastroenteritis and colitis, unspecified: Secondary | ICD-10-CM | POA: Diagnosis present

## 2015-03-05 DIAGNOSIS — J45909 Unspecified asthma, uncomplicated: Secondary | ICD-10-CM | POA: Diagnosis present

## 2015-03-05 DIAGNOSIS — Z90710 Acquired absence of both cervix and uterus: Secondary | ICD-10-CM | POA: Diagnosis not present

## 2015-03-05 DIAGNOSIS — Z888 Allergy status to other drugs, medicaments and biological substances status: Secondary | ICD-10-CM | POA: Diagnosis not present

## 2015-03-05 DIAGNOSIS — I2510 Atherosclerotic heart disease of native coronary artery without angina pectoris: Secondary | ICD-10-CM | POA: Diagnosis present

## 2015-03-05 DIAGNOSIS — Z91018 Allergy to other foods: Secondary | ICD-10-CM | POA: Diagnosis not present

## 2015-03-05 DIAGNOSIS — M109 Gout, unspecified: Secondary | ICD-10-CM | POA: Diagnosis present

## 2015-03-05 DIAGNOSIS — Z7982 Long term (current) use of aspirin: Secondary | ICD-10-CM | POA: Diagnosis not present

## 2015-03-05 DIAGNOSIS — E8881 Metabolic syndrome: Secondary | ICD-10-CM | POA: Diagnosis present

## 2015-03-05 LAB — GLUCOSE, CAPILLARY
GLUCOSE-CAPILLARY: 203 mg/dL — AB (ref 70–99)
GLUCOSE-CAPILLARY: 232 mg/dL — AB (ref 70–99)
Glucose-Capillary: 188 mg/dL — ABNORMAL HIGH (ref 70–99)
Glucose-Capillary: 167 mg/dL — ABNORMAL HIGH (ref 70–99)
Glucose-Capillary: 209 mg/dL — ABNORMAL HIGH (ref 70–99)

## 2015-03-05 LAB — URIC ACID: Uric Acid, Serum: 9.9 mg/dL — ABNORMAL HIGH (ref 2.4–7.0)

## 2015-03-05 LAB — PROTIME-INR
INR: 1.1 (ref 0.00–1.49)
Prothrombin Time: 14.3 seconds (ref 11.6–15.2)

## 2015-03-05 LAB — LACTIC ACID, PLASMA: Lactic Acid, Venous: 1.6 mmol/L (ref 0.5–2.0)

## 2015-03-05 MED ORDER — CIPROFLOXACIN IN D5W 400 MG/200ML IV SOLN
400.0000 mg | Freq: Two times a day (BID) | INTRAVENOUS | Status: DC
  Administered 2015-03-05 – 2015-03-06 (×2): 400 mg via INTRAVENOUS
  Filled 2015-03-05 (×3): qty 200

## 2015-03-05 MED ORDER — IOHEXOL 300 MG/ML  SOLN
80.0000 mL | Freq: Once | INTRAMUSCULAR | Status: AC | PRN
  Administered 2015-03-05: 80 mL via INTRAVENOUS

## 2015-03-05 MED ORDER — SODIUM CHLORIDE 0.9 % IV BOLUS (SEPSIS)
500.0000 mL | Freq: Once | INTRAVENOUS | Status: AC
  Administered 2015-03-05: 500 mL via INTRAVENOUS

## 2015-03-05 MED ORDER — SODIUM CHLORIDE 0.9 % IV SOLN
INTRAVENOUS | Status: AC
  Administered 2015-03-05: 08:00:00 via INTRAVENOUS

## 2015-03-05 MED ORDER — CIPROFLOXACIN IN D5W 400 MG/200ML IV SOLN
400.0000 mg | Freq: Once | INTRAVENOUS | Status: AC
  Administered 2015-03-05: 400 mg via INTRAVENOUS
  Filled 2015-03-05: qty 200

## 2015-03-05 MED ORDER — SIMVASTATIN 10 MG PO TABS
10.0000 mg | ORAL_TABLET | Freq: Every day | ORAL | Status: DC
  Administered 2015-03-05 – 2015-03-07 (×3): 10 mg via ORAL
  Filled 2015-03-05 (×3): qty 1

## 2015-03-05 MED ORDER — OXYCODONE HCL 5 MG PO TABS
5.0000 mg | ORAL_TABLET | ORAL | Status: DC | PRN
  Administered 2015-03-05: 10 mg via ORAL
  Filled 2015-03-05: qty 2

## 2015-03-05 MED ORDER — MORPHINE SULFATE 4 MG/ML IJ SOLN
4.0000 mg | Freq: Once | INTRAMUSCULAR | Status: AC
  Administered 2015-03-05: 4 mg via INTRAVENOUS
  Filled 2015-03-05: qty 1

## 2015-03-05 MED ORDER — IOHEXOL 300 MG/ML  SOLN
25.0000 mL | INTRAMUSCULAR | Status: AC
  Administered 2015-03-05: 25 mL via ORAL

## 2015-03-05 MED ORDER — ONDANSETRON HCL 4 MG/2ML IJ SOLN: 4.0000 mg | Freq: Once | INTRAMUSCULAR | Status: DC

## 2015-03-05 MED ORDER — LORATADINE 10 MG PO TABS: 10.0000 mg | ORAL_TABLET | Freq: Every day | ORAL | Status: DC | PRN

## 2015-03-05 MED ORDER — ALBUTEROL SULFATE (2.5 MG/3ML) 0.083% IN NEBU: 3.0000 mL | INHALATION_SOLUTION | Freq: Four times a day (QID) | RESPIRATORY_TRACT | Status: DC | PRN

## 2015-03-05 MED ORDER — COLCHICINE 0.6 MG PO TABS: 0.6000 mg | ORAL_TABLET | Freq: Two times a day (BID) | ORAL | Status: DC | PRN

## 2015-03-05 MED ORDER — INSULIN ASPART 100 UNIT/ML ~~LOC~~ SOLN
0.0000 [IU] | Freq: Three times a day (TID) | SUBCUTANEOUS | Status: DC
  Administered 2015-03-05: 2 [IU] via SUBCUTANEOUS
  Administered 2015-03-05: 3 [IU] via SUBCUTANEOUS
  Administered 2015-03-05 – 2015-03-06 (×2): 2 [IU] via SUBCUTANEOUS
  Administered 2015-03-06: 1 [IU] via SUBCUTANEOUS
  Administered 2015-03-07: 3 [IU] via SUBCUTANEOUS
  Administered 2015-03-07: 1 [IU] via SUBCUTANEOUS
  Administered 2015-03-07: 2 [IU] via SUBCUTANEOUS
  Administered 2015-03-08: 3 [IU] via SUBCUTANEOUS
  Administered 2015-03-08: 1 [IU] via SUBCUTANEOUS

## 2015-03-05 MED ORDER — ONDANSETRON HCL 4 MG/2ML IJ SOLN
4.0000 mg | Freq: Once | INTRAMUSCULAR | Status: AC
  Administered 2015-03-05: 4 mg via INTRAVENOUS
  Filled 2015-03-05: qty 2

## 2015-03-05 MED ORDER — METRONIDAZOLE IN NACL 5-0.79 MG/ML-% IV SOLN
500.0000 mg | Freq: Three times a day (TID) | INTRAVENOUS | Status: DC
  Administered 2015-03-05 – 2015-03-06 (×3): 500 mg via INTRAVENOUS
  Filled 2015-03-05 (×5): qty 100

## 2015-03-05 MED ORDER — METRONIDAZOLE IN NACL 5-0.79 MG/ML-% IV SOLN
500.0000 mg | Freq: Once | INTRAVENOUS | Status: AC
  Administered 2015-03-05: 500 mg via INTRAVENOUS
  Filled 2015-03-05: qty 100

## 2015-03-05 MED ORDER — METOPROLOL TARTRATE 25 MG PO TABS
25.0000 mg | ORAL_TABLET | Freq: Two times a day (BID) | ORAL | Status: DC
  Administered 2015-03-05 – 2015-03-08 (×7): 25 mg via ORAL
  Filled 2015-03-05 (×7): qty 1

## 2015-03-05 MED ORDER — MORPHINE SULFATE 4 MG/ML IJ SOLN: 4.0000 mg | INTRAMUSCULAR | Status: DC | PRN

## 2015-03-05 MED ORDER — ASPIRIN EC 325 MG PO TBEC
325.0000 mg | DELAYED_RELEASE_TABLET | Freq: Every day | ORAL | Status: DC
  Administered 2015-03-05 – 2015-03-08 (×4): 325 mg via ORAL
  Filled 2015-03-05 (×4): qty 1

## 2015-03-05 MED ORDER — ONDANSETRON HCL 4 MG/2ML IJ SOLN: 4.0000 mg | Freq: Three times a day (TID) | INTRAMUSCULAR | Status: DC | PRN

## 2015-03-05 MED ORDER — HEPARIN SODIUM (PORCINE) 5000 UNIT/ML IJ SOLN
5000.0000 [IU] | Freq: Three times a day (TID) | INTRAMUSCULAR | Status: DC
  Administered 2015-03-05 – 2015-03-06 (×4): 5000 [IU] via SUBCUTANEOUS
  Filled 2015-03-05 (×4): qty 1

## 2015-03-05 MED ORDER — INSULIN DETEMIR 100 UNIT/ML ~~LOC~~ SOLN
7.0000 [IU] | Freq: Every day | SUBCUTANEOUS | Status: DC
  Administered 2015-03-05 – 2015-03-07 (×3): 7 [IU] via SUBCUTANEOUS
  Filled 2015-03-05 (×4): qty 0.07

## 2015-03-05 NOTE — Evaluation (Signed)
Physical Therapy Evaluation Patient Details Name: Cheryl Gill MRN: [id number] DOB: 1951-05-25 Today's Date: 03/05/2015   History of Present Illness  Patient is a 64 y/o female admitted with abdominal pain and diarrhea. CT abdomen- acute colitis. PMH of hypertension, hyperlipidemia, gout, diabetes mellitus, chronic kidney disease-stage III, CAD (post status of CABG on 12/21/14) and asthma.     Clinical Impression  Patient presents close to functional baseline and able to ambulate community distances while performing higher level balance challenges without difficulty or LOB. Encourage daily ambulation while in hospital. Pt does not require skilled therapy services. Discharge from therapy.    Follow Up Recommendations No PT follow up    Equipment Recommendations  None recommended by PT    Recommendations for Other Services       Precautions / Restrictions Precautions Precautions: None Restrictions Weight Bearing Restrictions: No      Mobility  Bed Mobility Overal bed mobility: Modified Independent                Transfers Overall transfer level: Independent Equipment used: None                Ambulation/Gait Ambulation/Gait assistance: Independent Ambulation Distance (Feet): 510 Feet Assistive device: None Gait Pattern/deviations: Step-through pattern;Decreased stride length   Gait velocity interpretation: Below normal speed for age/gender General Gait Details: Pt with steady gait and tolerated higher level balance challenges without difficulty. See balance section.  Stairs            Wheelchair Mobility    Modified Rankin (Stroke Patients Only)       Balance Overall balance assessment: Needs assistance                           High level balance activites: Direction changes;Turns;Sudden stops;Head turns High Level Balance Comments: Tolerated above activities w/out deviations in gait path or LOB.             Pertinent  Vitals/Pain Pain Assessment: No/denies pain    Home Living Family/patient expects to be discharged to:: Private residence Living Arrangements: Children Available Help at Discharge: Family;Available PRN/intermittently Type of Home: House Home Access: Level entry     Home Layout: Two level;Able to live on main level with bedroom/bathroom Home Equipment: Dan Humphreys - 2 wheels;Cane - single point      Prior Function Level of Independence: Independent         Comments: Pt currently going to cardiac rehab 3x/week. Drives.      Hand Dominance        Extremity/Trunk Assessment   Upper Extremity Assessment: Overall WFL for tasks assessed;Defer to OT evaluation           Lower Extremity Assessment: Overall WFL for tasks assessed      Cervical / Trunk Assessment: Normal  Communication   Communication: No difficulties  Cognition Arousal/Alertness: Awake/alert Behavior During Therapy: WFL for tasks assessed/performed Overall Cognitive Status: Within Functional Limits for tasks assessed                      General Comments      Exercises        Assessment/Plan    PT Assessment Patent does not need any further PT services  PT Diagnosis     PT Problem List    PT Treatment Interventions     PT Goals (Current goals can be found in the Care Plan section) Acute Rehab PT Goals PT  Goal Formulation: All assessment and education complete, DC therapy    Frequency     Barriers to discharge        Co-evaluation               End of Session Equipment Utilized During Treatment: Gait belt Activity Tolerance: Patient tolerated treatment well Patient left: in chair;with call bell/phone within reach           Time: 1330-1344 PT Time Calculation (min) (ACUTE ONLY): 14 min   Charges:   PT Evaluation $Initial PT Evaluation Tier I: 1 Procedure     PT G CodesAlvie Heidelberg:        Folan, Chonda Baney A 03/05/2015, 1:52 PM Alvie HeidelbergShauna Folan, PT, DPT [phone]

## 2015-03-05 NOTE — H&P (Signed)
[facility] History and Physical  Cheryl Gill JKK:[id number] DOB: 1951-06-09 DOA: 03/04/2015  Referring physician: ED physician PCP: Dorrene GermanAVBUERE,[name], MD  Specialists:   Chief Complaint: Abdominal pain and diarrhea  HPI: Cheryl Gill is a 64 y.o. female with past medical history of hypertension, hyperlipidemia, gout, diabetes mellitus, chronic kidney disease-stage III, coronary artery disease (post status of CABG on 12/21/14), asthma, allergy, who presents with abdominal pain and diarrhea.  Patient reports that she started having abdominal pain 4 days ago. It is located in the lower abdomen. It is associated with nausea, but not vomiting. She also has diarrhea. She had 4 bowel movements with loose stools today. Patient does not have fever, chills. Denies recent antibiotics use. Patient denies chest pain, SOB, dysuria, urgency, frequency, hematuria, skin rashes or leg swelling. No unilateral weakness, numbness or tingling sensations. No vision change or hearing loss.  In ED, patient was found to have leukocytosis with WBC 13.1. Negative urinalysis. Negative lipase. Stable renal function. Temperature 98.0. No tachycardia. Hemodynamically stable. CT-abd/pelvis showed colitis in ascending and proximal transverse colon, no evidence for perforation or other complication identified. Patient is admitted to inpatient for further evaluation and treatment.  Review of Systems: As presented in the history of presenting illness, rest negative.  Where does patient live?  At home Can patient participate in ADLs? Yes  Allergy:  Allergies  Allergen Reactions  . Ace Inhibitors Anaphylaxis  . Lisinopril Anaphylaxis  . Mushroom Extract Complex Swelling  . Tetanus Toxoids Nausea And Vomiting and Swelling    Past Medical History  Diagnosis Date  . Diabetes mellitus type 2, insulin dependent   . Essential hypertension   . CKD (chronic kidney disease), stage III   . Obesity     BMI ~35-36  .  Hyperlipidemia with target LDL less than 70 01/28/2014  . NSTEMI (non-ST elevated myocardial infarction) 12/18/2014    Echo 1/6: EF 60-65%, no Regional WMA, Gr 1 DD  . 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 Metabolic syndrome: DM, HTN, Obesity as well as HLD 12/18/2014  . Seasonal allergies   . Asthma     Past Surgical History  Procedure Laterality Date  . Abdominal hysterectomy    . Tonsillectomy    . Left heart catheterization with coronary angiogram N/A 12/18/2014    Procedure: LEFT HEART CATHETERIZATION WITH CORONARY ANGIOGRAM;  Surgeon: Peter M [name], MD;  [facility];  Service: Cardiovascular;  Laterality: N/A;  . Coronary artery bypass graft N/A 12/21/2014    Procedure: CORONARY ARTERY BYPASS GRAFTING (CABG) TIMES TWO USING LEFT INTERNAL MAMMARY AND RIGHT SAPHENOUS LEG VEIN HARVESTED ENDOSCOPICALLY;  Surgeon: Loreli SlotSteven C Hendrickson, MD;  [facility];  Service: Open Heart Surgery;  Laterality: N/A;  . Tee without cardioversion N/A 12/21/2014    Procedure: TRANSESOPHAGEAL ECHOCARDIOGRAM (TEE);  Surgeon: Loreli SlotSteven C Hendrickson, MD;  [facility];  Service: Open Heart Surgery;  Laterality: N/A;  . [place] echocardiography  04/09/2010    Possible small patent foramen ovale suspected;trace MR, mild TR.  [name] Kitchen. Nm myocar perf wall motion  04/09/2010    Abnormal study - appears to be a small area of apical infarction. Reversible ischemia is not seen.    Social History:  reports that she has never smoked. She has never used smokeless tobacco. She reports that she does not drink alcohol or use illicit drugs.  Family  History:  Family History  Problem Relation Age of Onset  . Pancreatic cancer Mother   . Stroke Father   . CAD Mother     Open heart surgery 60s-70s  . Heart disease Sister     Unclear details     Prior to Admission medications   Medication Sig Start Date End Date Taking? Authorizing Provider   albuterol (PROVENTIL HFA;VENTOLIN HFA) 108 (90 BASE) MCG/ACT inhaler Inhale 2 puffs into the lungs every 6 (six) hours as needed. For shortness of breath or wheezing   Yes Historical Provider, MD  aspirin EC 325 MG EC tablet Take 1 tablet (325 mg total) by mouth daily. 12/27/14  Yes Erin R Barrett, PA-C  colchicine 0.6 MG tablet Take 0.6 mg by mouth 2 (two) times daily as needed (gout).    Yes Historical Provider, MD  glipiZIDE (GLUCOTROL) 10 MG tablet Take 10 mg by mouth 2 (two) times daily before a meal.   Yes Historical Provider, MD  hydrochlorothiazide (HYDRODIURIL) 25 MG tablet Take 25 mg by mouth daily.   Yes Historical Provider, MD  insulin detemir (LEVEMIR) 100 UNIT/ML injection Inject 10 Units into the skin at bedtime.   Yes Historical Provider, MD  linagliptin (TRADJENTA) 5 MG TABS tablet Take 5 mg by mouth daily.   Yes Historical Provider, MD  loratadine (CLARITIN) 10 MG tablet Take 10 mg by mouth daily as needed for allergies.   Yes Historical Provider, MD  metoprolol tartrate (LOPRESSOR) 25 MG tablet Take 1 tablet (25 mg total) by mouth 2 (two) times daily. 12/27/14  Yes Erin R Barrett, PA-C  oxyCODONE (OXY IR/ROXICODONE) 5 MG immediate release tablet Take 1-2 tablets (5-10 mg total) by mouth every 3 (three) hours as needed for severe pain. 12/27/14  Yes Erin R Barrett, PA-C  simvastatin (ZOCOR) 10 MG tablet Take 10 mg by mouth at bedtime.   Yes Historical Provider, MD  traMADol (ULTRAM) 50 MG tablet Take 1-2 tablets (50-100 mg total) by mouth every 4 (four) hours as needed for moderate pain. 12/27/14  Yes Harriet Pho, PA-C    Physical Exam: Filed Vitals:   03/05/15 0447 03/05/15 0500 03/05/15 0515 03/05/15 0530  BP:  105/67 111/75 100/63  Pulse: 97 79 79 67  Temp:      TempSrc:      Resp: 16 12 12 11   Height:      Weight:      SpO2: 99% 97% 95% 95%   General: Not in acute distress HEENT:       Eyes: PERRL, EOMI, no scleral icterus       ENT: No discharge from the ears and  nose, no pharynx injection, no tonsillar enlargement.        Neck: No JVD, no bruit, no mass felt. Cardiac: S1/S2, RRR, No murmurs, No gallops or rubs Pulm: Good air movement bilaterally. Clear to auscultation bilaterally. No rales, wheezing, rhonchi or rubs. Abd: Soft, nondistended, tenderness over lower abdomen, no rebound pain, no organomegaly, BS present Ext: No edema bilaterally. 2+DP/PT pulse bilaterally Musculoskeletal: No joint deformities, erythema, or stiffness, ROM full Skin: No rashes.  Neuro: Alert and oriented X3, cranial nerves II-XII grossly intact, muscle strength 5/5 in all extremeties, sensation to light touch intact. Brachial reflex 2+ bilaterally. Knee reflex 1+ bilaterally. Negative Babinski's sign. Normal finger to nose test. Psych: Patient is not psychotic, no suicidal or hemocidal ideation.  Labs on Admission:  Basic Metabolic Panel:  Recent Labs Lab 03/04/15 2047  NA 140  K 3.9  CL 106  CO2 26  GLUCOSE 221*  BUN 17  CREATININE 1.38*  CALCIUM 9.1   Liver Function Tests:  Recent Labs Lab 03/04/15 2047  AST 21  ALT 20  ALKPHOS 82  BILITOT 0.5  PROT 7.5  ALBUMIN 3.6    Recent Labs Lab 03/04/15 2047  LIPASE 34   No results for input(s): AMMONIA in the last 168 hours. CBC:  Recent Labs Lab 03/04/15 2047  WBC 13.1*  NEUTROABS 8.4*  HGB 12.5  HCT 36.9  MCV 92.3  PLT 310   Cardiac Enzymes: No results for input(s): CKTOTAL, CKMB, CKMBINDEX, TROPONINI in the last 168 hours.  BNP (last 3 results) No results for input(s): BNP in the last 8760 hours.  ProBNP (last 3 results) No results for input(s): PROBNP in the last 8760 hours.  CBG:  Recent Labs Lab 02/27/15 1330 03/01/15 1330 03/04/15 1422  GLUCAP 237* 217* 206*    Radiological Exams on Admission: Ct Abdomen Pelvis W Contrast  03/05/2015   CLINICAL DATA:  Initial evaluation for acute lower abdominal pain since Friday.  EXAM: CT ABDOMEN AND PELVIS WITH CONTRAST  TECHNIQUE:  Multidetector CT imaging of the abdomen and pelvis was performed using the standard protocol following bolus administration of intravenous contrast.  CONTRAST:  80mL OMNIPAQUE IOHEXOL 300 MG/ML  SOLN  COMPARISON:  Prior radiograph from earlier the same day  FINDINGS: Minimal dependent atelectasis noted within the visualized lung bases. Visualized lungs are otherwise clear.  The liver demonstrates a normal contrast enhanced appearance. Focal fat deposition noted adjacent to the fissure for ligamentum teres. Gallbladder within normal limits. No biliary dilatation. Spleen, adrenal glands, and pancreas demonstrate a normal contrast enhanced appearance.  Kidneys are equal in size with symmetric enhancement. No nephrolithiasis, hydronephrosis, or focal enhancing renal mass.  Stomach within normal limits. Small fat containing paraesophageal hernia noted. No evidence for bowel obstruction. There is wall thickening with mild inflammatory stranding about the ascending and proximal transverse colon, suggesting acute colitis. Changes most evident at the hepatic flexure. No other acute inflammatory changes seen about the bowels. Few scattered colonic diverticula noted. Appendix not discretely identified.  Bladder within normal limits.  Uterus is absent.  Ovaries not discretely identified.  No pathologically enlarged intra-abdominal or pelvic lymph nodes identified. Normal intravascular enhancement seen throughout the abdomen and pelvis. No free air or fluid.  No acute osseous abnormality. No worrisome lytic or blastic osseous lesions.  IMPRESSION: 1. Wall thickening with inflammatory stranding about the ascending and proximal transverse colon, most consistent with acute colitis. No evidence for perforation or other complication identified. 2. No other acute intra-abdominal or pelvic process identified.   Electronically Signed   By: Rise Mu M.D.   On: 03/05/2015 04:51   Dg Abd Acute W/chest  03/05/2015   CLINICAL  DATA:  Acute onset of lower abdominal pain and diarrhea. Initial encounter.  EXAM: ACUTE ABDOMEN SERIES (ABDOMEN 2 VIEW & CHEST 1 VIEW)  COMPARISON:  Chest radiograph performed 01/22/2015, and CT of the abdomen and pelvis performed 01/28/2014  FINDINGS: The lungs are well-aerated and clear. There is no evidence of focal opacification, pleural effusion or pneumothorax. The cardiomediastinal silhouette is borderline normal in size. The patient is status post median sternotomy, with evidence of prior CABG.  The visualized bowel gas pattern is unremarkable. Scattered stool and air are seen within the colon; there is no evidence of small bowel dilatation to suggest obstruction. No free intra-abdominal air is identified on the  provided upright view.  No acute osseous abnormalities are seen; the sacroiliac joints are unremarkable in appearance.  IMPRESSION: 1. Unremarkable bowel gas pattern; no free intra-abdominal air seen. Small to moderate amount of stool noted in the colon. 2. Lungs hypoexpanded but grossly clear.   Electronically Signed   By: Roanna Raider M.D.   On: 03/05/2015 00:34    EKG: will get one  Assessment/Plan Principal Problem:   Colitis, acute Active Problems:   Seasonal allergies   Essential hypertension   Diabetes mellitus type 2, uncontrolled   Abdominal pain   Asthma, chronic   Hyperlipidemia with target LDL less than 70   Obesity, Class II, BMI 35-39.9   Metabolic syndrome: DM, HTN, Obesity as well as HLD   S/P CABG x 2   CAD (coronary artery disease)   Colitis   Colitis: CT-abd/pelvis showed colitis in ascending and proximal transverse colon. Patient is not septic. Hemodynamically stable. She still has moderate abdominal pain and nausea. -will admit to med-surg bed -start IV Flagyl and ciprofloxacin -IVF: ns 75cc/h -When necessary morphine for severe pain, home oxycodone for moderate pain -Zofran for nausea, get EKG for monitoring QTc interval -check lactic acid, C.  difficile PCR, GI pathogen panel, stool culture, blood culture 2  Hypertension: -Continue metoprolol -Hold hydrochlorothiazide since patient needs IV fluid  CAD: s/p of CABG on 12/2014. No chest pain. -Continue aspirin, Zocor, metoprolol  Gout: Stable -Continue colchicine  Diabetes mellitus: A1c was 9.0 on 12/17/14. Patient is on Levemir and glipizide at home  -decreased Levemir dose from 10 to 7 units daily -ssi  Asthma: Stable. No signs of acute exacerbation. -Continue albuterol when necessary  Hyperlipidemia: LDL was 62 on 12/18/14. -Continue Zocor   DVT ppx: SQ Heparin       Code Status: Full code Family Communication: None at bed side.       Disposition Plan: Admit to inpatient   Date of Service 03/05/2015    Lorretta Harp [facility] Pager [phone]  If 7PM-7AM, please contact night-coverage [email or web address] Password [facility] 03/05/2015, 5:53 AM

## 2015-03-05 NOTE — Progress Notes (Signed)
Received patient from ED around 0645.  Patient AOx4, VS stable and no complain of pain as of this writing.  Able to go to bathroom without assistance.  Place on enteric contact precaution for diarrhea, norovirus.  Will endorse to incoming nurse appropriately.

## 2015-03-05 NOTE — Progress Notes (Signed)
Patient seen and examined, vital stable, reported intermittent ab cramping, denies n/v, no chest pain,  No sob, wanting to eat. Start clears, advance diet as tolerated, continue abx/ivf, home bp meds for now, likely will need restart bp meds tomorrow. Reported h/o gout tend to flare up in the hospital, currently no active problem, check uric acid.

## 2015-03-05 NOTE — ED Notes (Signed)
CT called and informed pt has finished her contrast.  

## 2015-03-06 ENCOUNTER — Encounter (HOSPITAL_COMMUNITY): Payer: Medicare Other

## 2015-03-06 DIAGNOSIS — I2510 Atherosclerotic heart disease of native coronary artery without angina pectoris: Secondary | ICD-10-CM

## 2015-03-06 DIAGNOSIS — K921 Melena: Secondary | ICD-10-CM

## 2015-03-06 LAB — CBC
HCT: 33.8 % — ABNORMAL LOW (ref 36.0–46.0)
HEMATOCRIT: 35.5 % — AB (ref 36.0–46.0)
HEMOGLOBIN: 11.1 g/dL — AB (ref 12.0–15.0)
Hemoglobin: 11.9 g/dL — ABNORMAL LOW (ref 12.0–15.0)
MCH: 30.5 pg (ref 26.0–34.0)
MCH: 31.2 pg (ref 26.0–34.0)
MCHC: 32.8 g/dL (ref 30.0–36.0)
MCHC: 33.5 g/dL (ref 30.0–36.0)
MCV: 92.9 fL (ref 78.0–100.0)
MCV: 92.9 fL (ref 78.0–100.0)
Platelets: 260 10*3/uL (ref 150–400)
Platelets: 260 10*3/uL (ref 150–400)
RBC: 3.64 MIL/uL — AB (ref 3.87–5.11)
RBC: 3.82 MIL/uL — AB (ref 3.87–5.11)
RDW: 13.3 % (ref 11.5–15.5)
RDW: 13.4 % (ref 11.5–15.5)
WBC: 9 10*3/uL (ref 4.0–10.5)
WBC: 9 10*3/uL (ref 4.0–10.5)

## 2015-03-06 LAB — BASIC METABOLIC PANEL
Anion gap: 4 — ABNORMAL LOW (ref 5–15)
BUN: 12 mg/dL (ref 6–23)
CALCIUM: 8.5 mg/dL (ref 8.4–10.5)
CO2: 25 mmol/L (ref 19–32)
Chloride: 109 mmol/L (ref 96–112)
Creatinine, Ser: 1.23 mg/dL — ABNORMAL HIGH (ref 0.50–1.10)
GFR, EST AFRICAN AMERICAN: 52 mL/min — AB (ref >90)
GFR, EST NON AFRICAN AMERICAN: 45 mL/min — AB (ref >90)
Glucose, Bld: 129 mg/dL — ABNORMAL HIGH (ref 70–99)
Potassium: 3.7 mmol/L (ref 3.5–5.1)
SODIUM: 138 mmol/L (ref 135–145)

## 2015-03-06 LAB — GLUCOSE, CAPILLARY
Glucose-Capillary: 112 mg/dL — ABNORMAL HIGH (ref 70–99)
Glucose-Capillary: 150 mg/dL — ABNORMAL HIGH (ref 70–99)
Glucose-Capillary: 167 mg/dL — ABNORMAL HIGH (ref 70–99)
Glucose-Capillary: 163 mg/dL — ABNORMAL HIGH (ref 70–99)

## 2015-03-06 LAB — CLOSTRIDIUM DIFFICILE BY PCR: Toxigenic C. Difficile by PCR: NEGATIVE

## 2015-03-06 MED ORDER — MORPHINE SULFATE 2 MG/ML IJ SOLN: 2.0000 mg | INTRAMUSCULAR | Status: DC | PRN

## 2015-03-06 MED ORDER — CIPROFLOXACIN HCL 500 MG PO TABS
500.0000 mg | ORAL_TABLET | Freq: Two times a day (BID) | ORAL | Status: DC
  Administered 2015-03-06 – 2015-03-08 (×5): 500 mg via ORAL
  Filled 2015-03-06 (×4): qty 1

## 2015-03-06 MED ORDER — METRONIDAZOLE 500 MG PO TABS
500.0000 mg | ORAL_TABLET | Freq: Three times a day (TID) | ORAL | Status: DC
  Administered 2015-03-06 – 2015-03-08 (×7): 500 mg via ORAL
  Filled 2015-03-06 (×7): qty 1

## 2015-03-06 NOTE — Progress Notes (Signed)
Dear Doctor: This patient has been identified as a candidate for PICC for the following reason (s): poor veins/poor circulatory system (CHF, COPD, emphysema, diabetes, steroid use, IV drug abuse, etc.) If you agree, please write an order for the indicated device. For any questions contact the Vascular Access Team at [phone] if no answer, please leave a message.  Thank you for supporting the early vascular access assessment program. Panayiotis Rainville M  

## 2015-03-06 NOTE — Progress Notes (Signed)
OT Cancellation Note  Patient Details Name: Cheryl Gill MRN: [id number] DOB: [birth year]   Cancelled Treatment:    Reason Eval/Treat Not Completed: OT screened, no needs identified, will sign off.  Pt is independent with BADLs  Angelene GiovanniConarpe, Malichi Palardy M  Shannin Naab Deeringonarpe, OTR/L [phone]  03/06/2015, 4:19 PM

## 2015-03-06 NOTE — Progress Notes (Signed)
[facility] PROGRESS NOTE  Breeann Reposa HYQ:[id number] DOB: May 08, 1951 DOA: 03/04/2015  PCP: Dorrene German, MD  Brief HPI: 64 year old African-American female with a past medical history of hypertension, hyperlipidemia, coronary artery disease status post CABG in January of this year who presented with abdominal pain and diarrhea. She underwent a CT scan of her abdomen which showed colitis involving the ascending colon and the proximal transverse colon. She was admitted for further management.  Past medical history:  Past Medical History  Diagnosis Date  . Diabetes mellitus type 2, insulin dependent   . Essential hypertension   . CKD (chronic kidney disease), stage III   . Obesity     BMI ~35-36  . Hyperlipidemia with target LDL less than 70 01/28/2014  . NSTEMI (non-ST elevated myocardial infarction) 12/18/2014    Echo 1/6: EF 60-65%, no Regional WMA, Gr 1 DD  . 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 Metabolic syndrome: DM, HTN, Obesity as well as HLD 12/18/2014  . Seasonal allergies   . Asthma   . Colitis     Consultants: None  Procedures: None  Antibiotics: Cipro and Flagyl. 3/22  Subjective: Patient reports a loose stool this morning with blood. She states that her abdominal pain is much better. Denies any nausea or vomiting. RN states that the blood was small amount.  Objective: Vital Signs  Filed Vitals:   03/05/15 0649 03/05/15 1430 03/05/15 2153 03/06/15 0625  BP: 111/73 125/73 132/79 116/70  Pulse: 75 78 75 75  Temp: 97.7 F (36.5 C) 97.8 F (36.6 C) 98.7 F (37.1 C) 97.9 F (36.6 C)  TempSrc: Oral Oral Oral Oral  Resp: Height:      Weight:      SpO2: 100% 100% 99%     Intake/Output Summary (Last 24 hours) at 03/06/15 0858 Last data filed at 03/06/15 0541  Gross per 24 hour  Intake    500 ml  Output      0 ml  Net    500 ml   Filed Weights     03/04/15 2041  Weight: 84.369 kg (186 lb)    General appearance: alert, cooperative, appears stated age and no distress Resp: clear to auscultation bilaterally Cardio: regular rate and rhythm, S1, S2 normal, no murmur, click, rub or gallop GI: soft, non-tender; bowel sounds normal; no masses,  no organomegaly Neurologic: Alert and oriented 3. No focal neurological deficits noted.  Lab Results:  Basic Metabolic Panel:  Recent Labs Lab 03/04/15 2047 03/06/15 0545  NA 140 138  K 3.9 3.7  CL 106 109  CO2 26 25  GLUCOSE 221* 129*  BUN 17 12  CREATININE 1.38* 1.23*  CALCIUM 9.1 8.5   Liver Function Tests:  Recent Labs Lab 03/04/15 2047  AST 21  ALT 20  ALKPHOS 82  BILITOT 0.5  PROT 7.5  ALBUMIN 3.6    Recent Labs Lab 03/04/15 2047  LIPASE 34   CBC:  Recent Labs Lab 03/04/15 2047 03/06/15 0545  WBC 13.1* 9.0  NEUTROABS 8.4*  --   HGB 12.5 11.1*  HCT 36.9 33.8*  MCV 92.3 92.9  PLT 310 260   CBG:  Recent Labs Lab 03/05/15 0818 03/05/15 1257 03/05/15 1835 03/05/15 2150 03/06/15 0809  GLUCAP 188* 167* 209* 232* 112*    Recent Results (from the past 240 hour(s))  Culture, blood (routine x 2)     Status: None (Preliminary result)   Collection Time: 03/05/15  8:26 AM  Result Value Ref Range Status   Specimen Description BLOOD RIGHT HAND  Final   Special Requests BOTTLES DRAWN AEROBIC ONLY 2CC  Final   Culture   Final           BLOOD CULTURE RECEIVED NO GROWTH TO DATE CULTURE WILL BE HELD FOR 5 DAYS BEFORE ISSUING A FINAL NEGATIVE REPORT Note: Culture results may be compromised due to an inadequate volume of blood received in culture bottles. Performed at [facility]    Report Status PENDING  Incomplete  Culture, blood (routine x 2)     Status: None (Preliminary result)   Collection Time: 03/05/15  8:32 AM  Result Value Ref Range Status   Specimen Description BLOOD LEFT HAND  Final   Special Requests BOTTLES DRAWN AEROBIC ONLY 3CC   Final   Culture   Final           BLOOD CULTURE RECEIVED NO GROWTH TO DATE CULTURE WILL BE HELD FOR 5 DAYS BEFORE ISSUING A FINAL NEGATIVE REPORT Performed at [facility]    Report Status PENDING  Incomplete  Clostridium Difficile by PCR     Status: None   Collection Time: 03/05/15  9:40 PM  Result Value Ref Range Status   C difficile by pcr NEGATIVE NEGATIVE Final      Studies/Results: Ct Abdomen Pelvis W Contrast  03/05/2015   CLINICAL DATA:  Initial evaluation for acute lower abdominal pain since Friday.  EXAM: CT ABDOMEN AND PELVIS WITH CONTRAST  TECHNIQUE: Multidetector CT imaging of the abdomen and pelvis was performed using the standard protocol following bolus administration of intravenous contrast.  CONTRAST:  80mL OMNIPAQUE IOHEXOL 300 MG/ML  SOLN  COMPARISON:  Prior radiograph from earlier the same day  FINDINGS: Minimal dependent atelectasis noted within the visualized lung bases. Visualized lungs are otherwise clear.  The liver demonstrates a normal contrast enhanced appearance. Focal fat deposition noted adjacent to the fissure for ligamentum teres. Gallbladder within normal limits. No biliary dilatation. Spleen, adrenal glands, and pancreas demonstrate a normal contrast enhanced appearance.  Kidneys are equal in size with symmetric enhancement. No nephrolithiasis, hydronephrosis, or focal enhancing renal mass.  Stomach within normal limits. Small fat containing paraesophageal hernia noted. No evidence for bowel obstruction. There is wall thickening with mild inflammatory stranding about the ascending and proximal transverse colon, suggesting acute colitis. Changes most evident at the hepatic flexure. No other acute inflammatory changes seen about the bowels. Few scattered colonic diverticula noted. Appendix not discretely identified.  Bladder within normal limits.  Uterus is absent.  Ovaries not discretely identified.  No pathologically enlarged intra-abdominal or pelvic lymph  nodes identified. Normal intravascular enhancement seen throughout the abdomen and pelvis. No free air or fluid.  No acute osseous abnormality. No worrisome lytic or blastic osseous lesions.  IMPRESSION: 1. Wall thickening with inflammatory stranding about the ascending and proximal transverse colon, most consistent with acute colitis. No evidence for perforation or other complication identified. 2. No other acute intra-abdominal or pelvic process identified.   Electronically Signed   By: Rise Mu M.D.   On: 03/05/2015 04:51   Dg Abd Acute W/chest  03/05/2015   CLINICAL DATA:  Acute onset of lower abdominal pain and diarrhea. Initial encounter.  EXAM: ACUTE ABDOMEN SERIES (ABDOMEN 2 VIEW & CHEST 1 VIEW)  COMPARISON:  Chest radiograph performed 01/22/2015, and CT  of the abdomen and pelvis performed 01/28/2014  FINDINGS: The lungs are well-aerated and clear. There is no evidence of focal opacification, pleural effusion or pneumothorax. The cardiomediastinal silhouette is borderline normal in size. The patient is status post median sternotomy, with evidence of prior CABG.  The visualized bowel gas pattern is unremarkable. Scattered stool and air are seen within the colon; there is no evidence of small bowel dilatation to suggest obstruction. No free intra-abdominal air is identified on the provided upright view.  No acute osseous abnormalities are seen; the sacroiliac joints are unremarkable in appearance.  IMPRESSION: 1. Unremarkable bowel gas pattern; no free intra-abdominal air seen. Small to moderate amount of stool noted in the colon. 2. Lungs hypoexpanded but grossly clear.   Electronically Signed   By: Roanna RaiderJeffery  Chang M.D.   On: 03/05/2015 00:34    Medications:  Scheduled: . aspirin EC  325 mg Oral Daily  . ciprofloxacin  500 mg Oral BID  . heparin  5,000 Units Subcutaneous 3 times per day  . insulin aspart  0-9 Units Subcutaneous TID WC  . insulin detemir  7 Units Subcutaneous QHS  .  metoprolol tartrate  25 mg Oral BID  . metroNIDAZOLE  500 mg Oral 3 times per day  . simvastatin  10 mg Oral QHS   Continuous:  UJW:[id number]:albuterol, colchicine, loratadine, morphine injection, ondansetron (ZOFRAN) IV, oxyCODONE  Assessment/Plan:  Principal Problem:   Colitis, acute Active Problems:   Seasonal allergies   Essential hypertension   Diabetes mellitus type 2, uncontrolled   Abdominal pain   Asthma, chronic   Hyperlipidemia with target LDL less than 70   Obesity, Class II, BMI 35-39.9   Metabolic syndrome: DM, HTN, Obesity as well as HLD   S/P CABG x 2   CAD (coronary artery disease)   Colitis     Acute Colitis with mild hematochezia CT-abd/pelvis showed colitis in ascending and proximal transverse colon. Patient feels better. Still having occasional loose stool and did see some blood this morning. It wasn't a significant amount per RN. Continue with ciprofloxacin and Flagyl. Patient has lost IV access and is declining a PICC line. Since she is improved and is tolerating orally . Antibiotics can be changed over to oral. Check CBC this afternoon. Monitor for drop in hemoglobin. Stop Heparin. C. difficile was negative. GI pathogen panel is pending. Lactic acid level was normal. Blood cultures are negative so far.  History of essential Hypertension Blood pressure is reasonably well controlled. 2. Need to monitor.   CAD: s/p of CABG on 12/2014 Remains stable. Denies any chest pain. Continue with aspirin, Zocor, metoprolol.  History of Gout Stable. Continue colchicine  Type 2 Diabetes mellitus A1c was 9.0 on 12/17/14. Patient is on Levemir and glipizide at home. On a lower dose of Levemir and SSI.  History of Asthma Stable. No signs of acute exacerbation. Continue albuterol when necessary.  Hyperlipidemia LDL was 62 on 12/18/14. Continue Zocor   DVT Prophylaxis: Will stop heparin due to hematochezia. Start SCDs.    Code Status: Full code  Family Communication: Discussed  with the patient  Disposition Plan: Not ready for discharge    LOS: 1 day   [facility] HospitalKRISHNAN,Akram Kissick  [facility] Pager [phone] 03/06/2015, 8:58 AM  If 7PM-7AM, please contact night-coverage at [email or web address], password [email or web address]

## 2015-03-06 NOTE — Progress Notes (Signed)
Patient noted to have a small, watery, and bloody stool around 2200.  Stool sample sent to lab for C-diff PCR, stool culture, and GI pathogen panel per previous order.

## 2015-03-07 LAB — CBC
HCT: 34.5 % — ABNORMAL LOW (ref 36.0–46.0)
HEMOGLOBIN: 11.3 g/dL — AB (ref 12.0–15.0)
MCH: 30.6 pg (ref 26.0–34.0)
MCHC: 32.8 g/dL (ref 30.0–36.0)
MCV: 93.5 fL (ref 78.0–100.0)
PLATELETS: 256 10*3/uL (ref 150–400)
RBC: 3.69 MIL/uL — ABNORMAL LOW (ref 3.87–5.11)
RDW: 13.4 % (ref 11.5–15.5)
WBC: 8.4 10*3/uL (ref 4.0–10.5)

## 2015-03-07 LAB — BASIC METABOLIC PANEL
Anion gap: 8 (ref 5–15)
BUN: 19 mg/dL (ref 6–23)
CALCIUM: 8.8 mg/dL (ref 8.4–10.5)
CHLORIDE: 110 mmol/L (ref 96–112)
CO2: 20 mmol/L (ref 19–32)
Creatinine, Ser: 1.32 mg/dL — ABNORMAL HIGH (ref 0.50–1.10)
GFR calc Af Amer: 48 mL/min — ABNORMAL LOW (ref >90)
GFR calc non Af Amer: 41 mL/min — ABNORMAL LOW (ref >90)
GLUCOSE: 162 mg/dL — AB (ref 70–99)
Potassium: 4.1 mmol/L (ref 3.5–5.1)
Sodium: 138 mmol/L (ref 135–145)

## 2015-03-07 LAB — GLUCOSE, CAPILLARY
GLUCOSE-CAPILLARY: 209 mg/dL — AB (ref 70–99)
Glucose-Capillary: 172 mg/dL — ABNORMAL HIGH (ref 70–99)
Glucose-Capillary: 147 mg/dL — ABNORMAL HIGH (ref 70–99)
Glucose-Capillary: 163 mg/dL — ABNORMAL HIGH (ref 70–99)

## 2015-03-07 NOTE — Progress Notes (Signed)
[facility] PROGRESS NOTE  Cheryl Gill CWC:[id number] DOB: 1951/01/06 DOA: 03/04/2015  PCP: Dorrene German, MD  Brief HPI: 64 year old African-American female with a past medical history of hypertension, hyperlipidemia, coronary artery disease status post CABG in January of this year who presented with abdominal pain and diarrhea. She underwent a CT scan of her abdomen which showed colitis involving the ascending colon and the proximal transverse colon. She was admitted for further management.  Past medical history:  Past Medical History  Diagnosis Date  . Diabetes mellitus type 2, insulin dependent   . Essential hypertension   . CKD (chronic kidney disease), stage III   . Obesity     BMI ~35-36  . Hyperlipidemia with target LDL less than 70 01/28/2014  . NSTEMI (non-ST elevated myocardial infarction) 12/18/2014    Echo 1/6: EF 60-65%, no Regional WMA, Gr 1 DD  . 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 Metabolic syndrome: DM, HTN, Obesity as well as HLD 12/18/2014  . Seasonal allergies   . Asthma   . Colitis     Consultants: None  Procedures: None  Antibiotics: Cipro and Flagyl. 3/22  Subjective: Patient reports that she had multiple loose stools yesterday, some of which had blood in them. None overnight. None this morning so far. Abdominal pain is much better. Denies any nausea, vomiting. She's been tolerating oral diet.   Objective: Vital Signs  Filed Vitals:   03/05/15 2153 03/06/15 0625 03/06/15 2120 03/07/15 0513  BP: 132/79 116/70 111/73 108/69  Pulse: 75 75 72 73  Temp: 98.7 F (37.1 C) 97.9 F (36.6 C) 98.9 F (37.2 C) 98.1 F (36.7 C)  TempSrc: Oral Oral Oral Oral  Resp: Height:      Weight:      SpO2: 99%  100% 100%    Intake/Output Summary (Last 24 hours) at 03/07/15 0801 Last data filed at 03/07/15 7616  Gross per 24 hour  Intake    720 ml    Output      0 ml  Net    720 ml   Filed Weights   03/04/15 2041  Weight: 84.369 kg (186 lb)    General appearance: alert, cooperative, appears stated age and no distress Resp: clear to auscultation bilaterally Cardio: regular rate and rhythm, S1, S2 normal, no murmur, click, rub or gallop GI: soft, non-tender; bowel sounds normal; no masses,  no organomegaly Neurologic: Alert and oriented 3. No focal neurological deficits noted.  Lab Results:  Basic Metabolic Panel:  Recent Labs Lab 03/04/15 2047 03/06/15 0545 03/07/15 0511  NA 140 138 138  K 3.9 3.7 4.1  CL 106 109 110  CO2 GLUCOSE 221* 129* 162*  BUN CREATININE 1.38* 1.23* 1.32*  CALCIUM 9.1 8.5 8.8   Liver Function Tests:  Recent Labs Lab 03/04/15 2047  AST 21  ALT 20  ALKPHOS 82  BILITOT 0.5  PROT 7.5  ALBUMIN 3.6    Recent Labs Lab 03/04/15 2047  LIPASE 34   CBC:  Recent Labs Lab 03/04/15 2047 03/06/15 0545 03/06/15 1530 03/07/15 0511  WBC 13.1* 9.0 9.0 8.4  NEUTROABS 8.4*  --   --   --   HGB 12.5 11.1* 11.9* 11.3*  HCT 36.9 33.8* 35.5* 34.5*  MCV 92.3 92.9 92.9 93.5  PLT 310 260  260 256   CBG:  Recent Labs Lab 03/05/15 2150 03/06/15 0809 03/06/15 1254 03/06/15 1735 03/06/15 2126  GLUCAP 232* 112* 150* 167* 163*    Recent Results (from the past 240 hour(s))  Culture, blood (routine x 2)     Status: None (Preliminary result)   Collection Time: 03/05/15  8:26 AM  Result Value Ref Range Status   Specimen Description BLOOD RIGHT HAND  Final   Special Requests BOTTLES DRAWN AEROBIC ONLY 2CC  Final   Culture   Final           BLOOD CULTURE RECEIVED NO GROWTH TO DATE CULTURE WILL BE HELD FOR 5 DAYS BEFORE ISSUING A FINAL NEGATIVE REPORT Note: Culture results may be compromised due to an inadequate volume of blood received in culture bottles. Performed at [facility]    Report Status PENDING  Incomplete  Culture, blood (routine x 2)     Status: None  (Preliminary result)   Collection Time: 03/05/15  8:32 AM  Result Value Ref Range Status   Specimen Description BLOOD LEFT HAND  Final   Special Requests BOTTLES DRAWN AEROBIC ONLY 3CC  Final   Culture   Final           BLOOD CULTURE RECEIVED NO GROWTH TO DATE CULTURE WILL BE HELD FOR 5 DAYS BEFORE ISSUING A FINAL NEGATIVE REPORT Performed at [facility]    Report Status PENDING  Incomplete  Clostridium Difficile by PCR     Status: None   Collection Time: 03/05/15  9:40 PM  Result Value Ref Range Status   C difficile by pcr NEGATIVE NEGATIVE Final      Studies/Results: No results found.  Medications:  Scheduled: . aspirin EC  325 mg Oral Daily  . ciprofloxacin  500 mg Oral BID  . insulin aspart  0-9 Units Subcutaneous TID WC  . insulin detemir  7 Units Subcutaneous QHS  . metoprolol tartrate  25 mg Oral BID  . metroNIDAZOLE  500 mg Oral 3 times per day  . simvastatin  10 mg Oral QHS   Continuous:  ZOX:[id number], colchicine, loratadine, morphine injection, ondansetron (ZOFRAN) IV, oxyCODONE  Assessment/Plan:  Principal Problem:   Colitis, acute Active Problems:   Seasonal allergies   Essential hypertension   Diabetes mellitus type 2, uncontrolled   Abdominal pain   Asthma, chronic   Hyperlipidemia with target LDL less than 70   Obesity, Class II, BMI 35-39.9   Metabolic syndrome: DM, HTN, Obesity as well as HLD   S/P CABG x 2   CAD (coronary artery disease)   Colitis     Acute Colitis with mild hematochezia CT-abd/pelvis showed colitis in ascending and proximal transverse colon. She does have blood in the stool at times. Hemoglobin remains stable. She reports colonoscopy about 5 years ago which revealed polyps. This was done by Dr. Elnoria Howard. Bleeding is most likely due to inflammatory changes from colitis. Doesn't appear to be that significant. Continue current antibiotics. If she remains stable, anticipate she'll go home tomorrow and can follow up with GI as  an outpatient. C. difficile was negative. GI pathogen panel is pending. Lactic acid level was normal. Blood cultures are negative so far.  History of essential Hypertension Blood pressure is reasonably well controlled.   CAD: s/p CABG on 12/2014 Remains stable. Denies any chest pain. Continue with aspirin, Zocor, metoprolol.  History of Gout Stable. Continue colchicine  Type 2 Diabetes mellitus A1c was 9.0 on 12/17/14. Patient is on  Levemir and glipizide at home. On a lower dose of Levemir and SSI in the hospital.  History of Asthma Stable. No signs of acute exacerbation. Continue albuterol when necessary.  Hyperlipidemia LDL was 62 on 12/18/14. Continue Zocor   DVT Prophylaxis: SCDs.    Code Status: Full code  Family Communication: Discussed with the patient  Disposition Plan: Not ready for discharge.    LOS: 2 days   [facility] CenterKRISHNAN,Tiera Mensinger  [facility] Pager [phone] 03/07/2015, 8:01 AM  If 7PM-7AM, please contact night-coverage at [email or web address], password [email or web address]

## 2015-03-08 ENCOUNTER — Encounter (HOSPITAL_COMMUNITY): Payer: Medicare Other

## 2015-03-08 LAB — CBC
HEMATOCRIT: 34.5 % — AB (ref 36.0–46.0)
Hemoglobin: 11.3 g/dL — ABNORMAL LOW (ref 12.0–15.0)
MCH: 30.5 pg (ref 26.0–34.0)
MCHC: 32.8 g/dL (ref 30.0–36.0)
MCV: 93.2 fL (ref 78.0–100.0)
Platelets: 273 10*3/uL (ref 150–400)
RBC: 3.7 MIL/uL — ABNORMAL LOW (ref 3.87–5.11)
RDW: 13.6 % (ref 11.5–15.5)
WBC: 8.2 10*3/uL (ref 4.0–10.5)

## 2015-03-08 LAB — BASIC METABOLIC PANEL
Anion gap: 9 (ref 5–15)
BUN: 16 mg/dL (ref 6–23)
CHLORIDE: 111 mmol/L (ref 96–112)
CO2: 22 mmol/L (ref 19–32)
Calcium: 8.9 mg/dL (ref 8.4–10.5)
Creatinine, Ser: 1.43 mg/dL — ABNORMAL HIGH (ref 0.50–1.10)
GFR calc non Af Amer: 38 mL/min — ABNORMAL LOW (ref >90)
GFR, EST AFRICAN AMERICAN: 43 mL/min — AB (ref >90)
Glucose, Bld: 173 mg/dL — ABNORMAL HIGH (ref 70–99)
Potassium: 4.5 mmol/L (ref 3.5–5.1)
Sodium: 142 mmol/L (ref 135–145)

## 2015-03-08 LAB — GI PATHOGEN PANEL BY PCR, STOOL
C DIFFICILE TOXIN A/B: NOT DETECTED
Campylobacter by PCR: NOT DETECTED
Cryptosporidium by PCR: NOT DETECTED
E COLI (ETEC) LT/ST: NOT DETECTED
E COLI 0157 BY PCR: NOT DETECTED
E coli (STEC): NOT DETECTED
G LAMBLIA BY PCR: NOT DETECTED
Norovirus GI/GII: NOT DETECTED
Rotavirus A by PCR: NOT DETECTED
SHIGELLA BY PCR: NOT DETECTED
Salmonella by PCR: NOT DETECTED

## 2015-03-08 LAB — GLUCOSE, CAPILLARY
GLUCOSE-CAPILLARY: 148 mg/dL — AB (ref 70–99)
Glucose-Capillary: 208 mg/dL — ABNORMAL HIGH (ref 70–99)

## 2015-03-08 MED ORDER — METRONIDAZOLE 500 MG PO TABS: 500.0000 mg | ORAL_TABLET | Freq: Three times a day (TID) | ORAL | Status: DC

## 2015-03-08 MED ORDER — ONDANSETRON HCL 4 MG PO TABS
4.0000 mg | ORAL_TABLET | Freq: Once | ORAL | Status: AC
  Administered 2015-03-08: 4 mg via ORAL
  Filled 2015-03-08: qty 1

## 2015-03-08 MED ORDER — CIPROFLOXACIN HCL 500 MG PO TABS: 500.0000 mg | ORAL_TABLET | Freq: Two times a day (BID) | ORAL | Status: DC

## 2015-03-08 MED ORDER — ONDANSETRON 4 MG PO TBDP: 4.0000 mg | ORAL_TABLET | Freq: Three times a day (TID) | ORAL | Status: DC | PRN

## 2015-03-08 NOTE — Discharge Instructions (Signed)

## 2015-03-08 NOTE — Care Management Note (Signed)
  Page 1 of 1   03/08/2015     8:22:39 AM CARE MANAGEMENT NOTE 03/08/2015  Patient:  Cheryl Gill,Cheryl Gill   Account Number:  [id number]  Date Initiated:  03/08/2015  Documentation initiated by:  Ronny FlurryWILE,Deldrick Linch  Subjective/Objective Assessment:     Action/Plan:   Anticipated DC Date:     Anticipated DC Plan:           Choice offered to / List presented to:             Status of service:   Medicare Important Message given?  YES (If response is "NO", the following Medicare IM given date fields will be blank) Date Medicare IM given:  03/08/2015 Medicare IM given by:  Ronny FlurryWILE,Murry Khiev Date Additional Medicare IM given:   Additional Medicare IM given by:    Discharge Disposition:    Per UR Regulation:  Reviewed for med. necessity/level of care/duration of stay  If discussed at Long Length of Stay Meetings, dates discussed:    Comments:

## 2015-03-08 NOTE — Discharge Summary (Signed)
[facility]  Physician Discharge Summary   Patient ID: Cheryl Gill MRN: [id number] DOB/AGE: 20-Jun-1951 64 y.o.  Admit date: 03/04/2015 Discharge date: 03/08/2015  PCP: Dorrene German, MD  DISCHARGE DIAGNOSES:  Principal Problem:   Colitis, acute Active Problems:   Seasonal allergies   Essential hypertension   Diabetes mellitus type 2, uncontrolled   Abdominal pain   Asthma, chronic   Hyperlipidemia with target LDL less than 70   Obesity, Class II, BMI 35-39.9   Metabolic syndrome: DM, HTN, Obesity as well as HLD   S/P CABG x 2   CAD (coronary artery disease)   Colitis   RECOMMENDATIONS FOR OUTPATIENT FOLLOW UP: 1. Patient has been instructed to follow-up with her gastroenterologist, Dr. Elnoria Howard.   DISCHARGE CONDITION: fair  Diet recommendation: Modified carbohydrate  Filed Weights   03/04/15 2041  Weight: 84.369 kg (186 lb)    INITIAL HISTORY: 64 year old African-American female with a past medical history of hypertension, hyperlipidemia, coronary artery disease status post CABG in January of this year who presented with abdominal pain and diarrhea. She underwent a CT scan of her abdomen which showed colitis involving the ascending colon and the proximal transverse colon. She was admitted for further management.  Consultations:  None  Procedures:  None  HOSPITAL COURSE:   Acute Colitis with mild hematochezia CT-abd/pelvis showed colitis in ascending and proximal transverse colon. Patient was started on intravenous Cipro and Flagyl. She was also noted to have some blood in the stool at times. Hemoglobin remained stable. She reports colonoscopy about 5 years ago which revealed polyps. This was done by Dr. Elnoria Howard. Bleeding was thought to be most likely due to inflammatory changes from colitis. It wasn't that significant. Over the past 24 however, she hasn't had any bleeding. She did have a normal bowel movement. She does have some nausea this morning.  Abdomen remains benign. She was given Zofran. She could not eat her breakfast. No vomiting. After Zofran. She is feeling better. If she tolerates her lunch. She can be discharged later today. She's been told to follow-up with her gastroenterologist as an outpatient. C. difficile was negative. GI pathogen panel is pending. Lactic acid level was normal. Blood cultures are negative so far.  History of essential Hypertension Blood pressure is reasonably well controlled. Continue medications.  CAD: s/p CABG on 12/2014 Remains stable. Denies any chest pain. Continue with home medication regimen.  History of Gout Stable. Continue colchicine  Type 2 Diabetes mellitus A1c was 9.0 on 12/17/14. She may resume her home medication regimen.   History of Asthma Stable. No signs of acute exacerbation. Continue albuterol when necessary.  Hyperlipidemia LDL was 62 on 12/18/14. Continue Zocor  Overall, much improved from a colitis perspective. Some nausea this morning, which appears to be better after she was given Zofran. She can be discharged later today if she tolerates her lunch.   PERTINENT LABS:  The results of significant diagnostics from this hospitalization (including imaging, microbiology, ancillary and laboratory) are listed below for reference.    Microbiology: Recent Results (from the past 240 hour(s))  Culture, blood (routine x 2)     Status: None (Preliminary result)   Collection Time: 03/05/15  8:26 AM  Result Value Ref Range Status   Specimen Description BLOOD RIGHT HAND  Final   Special Requests BOTTLES DRAWN AEROBIC ONLY 2CC  Final   Culture   Final           BLOOD CULTURE RECEIVED NO GROWTH TO DATE CULTURE WILL  BE HELD FOR 5 DAYS BEFORE ISSUING A FINAL NEGATIVE REPORT Note: Culture results may be compromised due to an inadequate volume of blood received in culture bottles. Performed at [facility]    Report Status PENDING  Incomplete  Culture, blood (routine x 2)      Status: None (Preliminary result)   Collection Time: 03/05/15  8:32 AM  Result Value Ref Range Status   Specimen Description BLOOD LEFT HAND  Final   Special Requests BOTTLES DRAWN AEROBIC ONLY 3CC  Final   Culture   Final           BLOOD CULTURE RECEIVED NO GROWTH TO DATE CULTURE WILL BE HELD FOR 5 DAYS BEFORE ISSUING A FINAL NEGATIVE REPORT Performed at [facility]    Report Status PENDING  Incomplete  Clostridium Difficile by PCR     Status: None   Collection Time: 03/05/15  9:40 PM  Result Value Ref Range Status   C difficile by pcr NEGATIVE NEGATIVE Final  Stool culture     Status: None (Preliminary result)   Collection Time: 03/05/15  9:40 PM  Result Value Ref Range Status   Specimen Description STOOL  Final   Special Requests NONE  Final   Culture   Final    NO SUSPICIOUS COLONIES, CONTINUING TO HOLD Note: REDUCED NORMAL FLORA PRESENT Performed at [facility]    Report Status PENDING  Incomplete     Labs: Basic Metabolic Panel:  Recent Labs Lab 03/04/15 2047 03/06/15 0545 03/07/15 0511 03/08/15 0425  NA 140 138 138 142  K 3.9 3.7 4.1 4.5  CL 106 109 110 111  CO2 GLUCOSE 221* 129* 162* 173*  BUN CREATININE 1.38* 1.23* 1.32* 1.43*  CALCIUM 9.1 8.5 8.8 8.9   Liver Function Tests:  Recent Labs Lab 03/04/15 2047  AST 21  ALT 20  ALKPHOS 82  BILITOT 0.5  PROT 7.5  ALBUMIN 3.6    Recent Labs Lab 03/04/15 2047  LIPASE 34   CBC:  Recent Labs Lab 03/04/15 2047 03/06/15 0545 03/06/15 1530 03/07/15 0511 03/08/15 0425  WBC 13.1* 9.0 9.0 8.4 8.2  NEUTROABS 8.4*  --   --   --   --   HGB 12.5 11.1* 11.9* 11.3* 11.3*  HCT 36.9 33.8* 35.5* 34.5* 34.5*  MCV 92.3 92.9 92.9 93.5 93.2  PLT 310 260 260 256 273   CBG:  Recent Labs Lab 03/07/15 1226 03/07/15 1801 03/07/15 2147 03/08/15 0738 03/08/15 1128  GLUCAP 209* 147* 163* 148* 208*     IMAGING STUDIES Ct Abdomen Pelvis W Contrast  03/05/2015    CLINICAL DATA:  Initial evaluation for acute lower abdominal pain since Friday.  EXAM: CT ABDOMEN AND PELVIS WITH CONTRAST  TECHNIQUE: Multidetector CT imaging of the abdomen and pelvis was performed using the standard protocol following bolus administration of intravenous contrast.  CONTRAST:  80mL OMNIPAQUE IOHEXOL 300 MG/ML  SOLN  COMPARISON:  Prior radiograph from earlier the same day  FINDINGS: Minimal dependent atelectasis noted within the visualized lung bases. Visualized lungs are otherwise clear.  The liver demonstrates a normal contrast enhanced appearance. Focal fat deposition noted adjacent to the fissure for ligamentum teres. Gallbladder within normal limits. No biliary dilatation. Spleen, adrenal glands, and pancreas demonstrate a normal contrast enhanced appearance.  Kidneys are equal in size with symmetric enhancement. No nephrolithiasis, hydronephrosis, or focal enhancing renal mass.  Stomach within normal limits. Small fat  containing paraesophageal hernia noted. No evidence for bowel obstruction. There is wall thickening with mild inflammatory stranding about the ascending and proximal transverse colon, suggesting acute colitis. Changes most evident at the hepatic flexure. No other acute inflammatory changes seen about the bowels. Few scattered colonic diverticula noted. Appendix not discretely identified.  Bladder within normal limits.  Uterus is absent.  Ovaries not discretely identified.  No pathologically enlarged intra-abdominal or pelvic lymph nodes identified. Normal intravascular enhancement seen throughout the abdomen and pelvis. No free air or fluid.  No acute osseous abnormality. No worrisome lytic or blastic osseous lesions.  IMPRESSION: 1. Wall thickening with inflammatory stranding about the ascending and proximal transverse colon, most consistent with acute colitis. No evidence for perforation or other complication identified. 2. No other acute intra-abdominal or pelvic process  identified.   Electronically Signed   By: Rise Mu M.D.   On: 03/05/2015 04:51   Dg Abd Acute W/chest  03/05/2015   CLINICAL DATA:  Acute onset of lower abdominal pain and diarrhea. Initial encounter.  EXAM: ACUTE ABDOMEN SERIES (ABDOMEN 2 VIEW & CHEST 1 VIEW)  COMPARISON:  Chest radiograph performed 01/22/2015, and CT of the abdomen and pelvis performed 01/28/2014  FINDINGS: The lungs are well-aerated and clear. There is no evidence of focal opacification, pleural effusion or pneumothorax. The cardiomediastinal silhouette is borderline normal in size. The patient is status post median sternotomy, with evidence of prior CABG.  The visualized bowel gas pattern is unremarkable. Scattered stool and air are seen within the colon; there is no evidence of small bowel dilatation to suggest obstruction. No free intra-abdominal air is identified on the provided upright view.  No acute osseous abnormalities are seen; the sacroiliac joints are unremarkable in appearance.  IMPRESSION: 1. Unremarkable bowel gas pattern; no free intra-abdominal air seen. Small to moderate amount of stool noted in the colon. 2. Lungs hypoexpanded but grossly clear.   Electronically Signed   By: Roanna Raider M.D.   On: 03/05/2015 00:34    DISCHARGE EXAMINATION: Filed Vitals:   03/07/15 1025 03/07/15 1300 03/07/15 2108 03/08/15 0540  BP: 100/64 111/84 119/78 113/83  Pulse: 86 73 65 69  Temp: 98.8 F (37.1 C) 98.5 F (36.9 C) 98.6 F (37 C) 98.1 F (36.7 C)  TempSrc: Oral Oral Oral Oral  Resp: Height:      Weight:      SpO2: 98% 98% 98% 100%   General appearance: alert, cooperative, appears stated age and no distress Resp: clear to auscultation bilaterally Cardio: regular rate and rhythm, S1, S2 normal, no murmur, click, rub or gallop GI: soft, non-tender; bowel sounds normal; no masses,  no organomegaly  DISPOSITION: Home  Discharge Instructions    Call MD for:  difficulty breathing, headache  or visual disturbances    Complete by:  As directed      Call MD for:  extreme fatigue    Complete by:  As directed      Call MD for:  persistant dizziness or light-headedness    Complete by:  As directed      Call MD for:  persistant nausea and vomiting    Complete by:  As directed      Call MD for:  severe uncontrolled pain    Complete by:  As directed      Diet - low sodium heart healthy    Complete by:  As directed      Discharge instructions  Complete by:  As directed   Please follow up with your PCP and your Gastroenterologist, Dr. Elnoria HowardHung. Seek attention if your symptoms worsen as instructed.     Increase activity slowly    Complete by:  As directed            ALLERGIES:  Allergies  Allergen Reactions  . Ace Inhibitors Anaphylaxis  . Lisinopril Anaphylaxis  . Mushroom Extract Complex Swelling  . Tetanus Toxoids Nausea And Vomiting and Swelling     Current Discharge Medication List    START taking these medications   Details  ciprofloxacin (CIPRO) 500 MG tablet Take 1 tablet (500 mg total) by mouth 2 (two) times daily. For 11 more days. Qty: 22 tablet, Refills: 0    metroNIDAZOLE (FLAGYL) 500 MG tablet Take 1 tablet (500 mg total) by mouth every 8 (eight) hours. For 11 more days. Qty: 33 tablet, Refills: 0    ondansetron (ZOFRAN ODT) 4 MG disintegrating tablet Take 1 tablet (4 mg total) by mouth every 8 (eight) hours as needed for nausea or vomiting. Qty: 20 tablet, Refills: 0      CONTINUE these medications which have NOT CHANGED   Details  albuterol (PROVENTIL HFA;VENTOLIN HFA) 108 (90 BASE) MCG/ACT inhaler Inhale 2 puffs into the lungs every 6 (six) hours as needed. For shortness of breath or wheezing    aspirin EC 325 MG EC tablet Take 1 tablet (325 mg total) by mouth daily. Qty: 30 tablet, Refills: 0    colchicine 0.6 MG tablet Take 0.6 mg by mouth 2 (two) times daily as needed (gout).     glipiZIDE (GLUCOTROL) 10 MG tablet Take 10 mg by mouth 2 (two)  times daily before a meal.    hydrochlorothiazide (HYDRODIURIL) 25 MG tablet Take 25 mg by mouth daily.    insulin detemir (LEVEMIR) 100 UNIT/ML injection Inject 10 Units into the skin at bedtime.    linagliptin (TRADJENTA) 5 MG TABS tablet Take 5 mg by mouth daily.    loratadine (CLARITIN) 10 MG tablet Take 10 mg by mouth daily as needed for allergies.    metoprolol tartrate (LOPRESSOR) 25 MG tablet Take 1 tablet (25 mg total) by mouth 2 (two) times daily. Qty: 60 tablet, Refills: 3    oxyCODONE (OXY IR/ROXICODONE) 5 MG immediate release tablet Take 1-2 tablets (5-10 mg total) by mouth every 3 (three) hours as needed for severe pain. Qty: 30 tablet, Refills: 0    simvastatin (ZOCOR) 10 MG tablet Take 10 mg by mouth at bedtime.    traMADol (ULTRAM) 50 MG tablet Take 1-2 tablets (50-100 mg total) by mouth every 4 (four) hours as needed for moderate pain. Qty: 30 tablet, Refills: 0       Follow-up Information    Follow up with Dorrene GermanAVBUERE,[name], MD.   Specialty:  Internal Medicine   Why:  as previously scheduled   Contact information:   [address] [place] [address] [phone]       Follow up with HUNG,PATRICK D, MD. Schedule an appointment as soon as possible for a visit in 2 weeks.   Specialty:  Gastroenterology   Why:  To discuss blood in stool and CT findings.   Contact information:   [address], [address] [place] [address] [phone]       TOTAL DISCHARGE TIME: 35 mins  [facility] CenterKRISHNAN,Kenishia Plack  [facility] Pager [phone]  03/08/2015, 12:03 PM

## 2015-03-08 NOTE — Progress Notes (Signed)
Cheryl Gill to be D/C'd Home per MD order.  Discussed with the patient and all questions fully answered.  VSS, Skin clean, dry and intact without evidence of skin break down, no evidence of skin tears noted.  Patient able to tolerate lunch with no nausea afterwards. Dr. Rito EhrlichKrishnan made aware.  An After Visit Summary was printed and given to the patient. Patient received prescriptions.  D/c education completed with patient/family including follow up instructions, medication list, d/c activities limitations if indicated, with other d/c instructions as indicated by MD - patient able to verbalize understanding, all questions fully answered.   Patient instructed to return to ED, call 911, or call MD for any changes in condition.   Patient escorted via WC, and D/C home via private auto.  Burt EkCook, Rebecah Dangerfield D 03/08/2015 8:35 AM

## 2015-03-09 LAB — STOOL CULTURE

## 2015-03-11 ENCOUNTER — Encounter (HOSPITAL_COMMUNITY)
Admission: RE | Admit: 2015-03-11 | Discharge: 2015-03-11 | Disposition: A | Discharge status: Home / Self Care | Payer: Medicare Other | Source: Ambulatory Visit | Attending: Cardiology | Admitting: Cardiology

## 2015-03-11 DIAGNOSIS — Z951 Presence of aortocoronary bypass graft: Secondary | ICD-10-CM | POA: Diagnosis not present

## 2015-03-11 DIAGNOSIS — I2510 Atherosclerotic heart disease of native coronary artery without angina pectoris: Secondary | ICD-10-CM | POA: Diagnosis present

## 2015-03-11 LAB — CULTURE, BLOOD (ROUTINE X 2)
CULTURE: NO GROWTH
Culture: NO GROWTH

## 2015-03-11 LAB — GLUCOSE, CAPILLARY
GLUCOSE-CAPILLARY: 190 mg/dL — AB (ref 70–99)
Glucose-Capillary: 158 mg/dL — ABNORMAL HIGH (ref 70–99)

## 2015-03-11 NOTE — Progress Notes (Signed)
Pt returned to cardiac rehab today. Pt tolerated light exercise without difficulty.  Will continue to monitor.

## 2015-03-13 ENCOUNTER — Encounter (HOSPITAL_COMMUNITY)
Admission: RE | Admit: 2015-03-13 | Discharge: 2015-03-13 | Disposition: A | Discharge status: Home / Self Care | Payer: Medicare Other | Source: Ambulatory Visit | Attending: Cardiology | Admitting: Cardiology

## 2015-03-13 DIAGNOSIS — I2510 Atherosclerotic heart disease of native coronary artery without angina pectoris: Secondary | ICD-10-CM | POA: Diagnosis not present

## 2015-03-14 LAB — GLUCOSE, CAPILLARY: Glucose-Capillary: 248 mg/dL — ABNORMAL HIGH (ref 70–99)

## 2015-03-15 ENCOUNTER — Encounter (HOSPITAL_COMMUNITY)
Admission: RE | Admit: 2015-03-15 | Discharge: 2015-03-15 | Disposition: A | Discharge status: Home / Self Care | Payer: Medicare Other | Source: Ambulatory Visit | Attending: Cardiology | Admitting: Cardiology

## 2015-03-15 DIAGNOSIS — I2510 Atherosclerotic heart disease of native coronary artery without angina pectoris: Secondary | ICD-10-CM | POA: Insufficient documentation

## 2015-03-15 DIAGNOSIS — Z951 Presence of aortocoronary bypass graft: Secondary | ICD-10-CM | POA: Insufficient documentation

## 2015-03-18 ENCOUNTER — Encounter (HOSPITAL_COMMUNITY)
Admission: RE | Admit: 2015-03-18 | Discharge: 2015-03-18 | Disposition: A | Discharge status: Home / Self Care | Payer: Medicare Other | Source: Ambulatory Visit | Attending: Cardiology | Admitting: Cardiology

## 2015-03-18 DIAGNOSIS — I2510 Atherosclerotic heart disease of native coronary artery without angina pectoris: Secondary | ICD-10-CM | POA: Diagnosis not present

## 2015-03-18 LAB — GLUCOSE, CAPILLARY: Glucose-Capillary: 222 mg/dL — ABNORMAL HIGH (ref 70–99)

## 2015-03-19 LAB — GLUCOSE, CAPILLARY: Glucose-Capillary: 231 mg/dL — ABNORMAL HIGH (ref 70–99)

## 2015-03-20 ENCOUNTER — Encounter (HOSPITAL_COMMUNITY)
Admission: RE | Admit: 2015-03-20 | Discharge: 2015-03-20 | Disposition: A | Discharge status: Home / Self Care | Payer: Medicare Other | Source: Ambulatory Visit | Attending: Cardiology | Admitting: Cardiology

## 2015-03-20 DIAGNOSIS — I2510 Atherosclerotic heart disease of native coronary artery without angina pectoris: Secondary | ICD-10-CM | POA: Diagnosis not present

## 2015-03-20 LAB — GLUCOSE, CAPILLARY: GLUCOSE-CAPILLARY: 210 mg/dL — AB (ref 70–99)

## 2015-03-22 ENCOUNTER — Encounter (HOSPITAL_COMMUNITY): Admission: RE | Admit: 2015-03-22 | Payer: Medicare Other | Source: Ambulatory Visit

## 2015-03-25 ENCOUNTER — Encounter (HOSPITAL_COMMUNITY): Payer: Medicare Other

## 2015-03-25 ENCOUNTER — Telehealth (HOSPITAL_COMMUNITY): Payer: Self-pay | Admitting: Internal Medicine

## 2015-03-27 ENCOUNTER — Encounter (HOSPITAL_COMMUNITY)
Admission: RE | Admit: 2015-03-27 | Discharge: 2015-03-27 | Disposition: A | Discharge status: Home / Self Care | Payer: Medicare Other | Source: Ambulatory Visit | Attending: Cardiology | Admitting: Cardiology

## 2015-03-27 DIAGNOSIS — I2510 Atherosclerotic heart disease of native coronary artery without angina pectoris: Secondary | ICD-10-CM | POA: Diagnosis not present

## 2015-03-27 LAB — GLUCOSE, CAPILLARY: GLUCOSE-CAPILLARY: 224 mg/dL — AB (ref 70–99)

## 2015-03-29 ENCOUNTER — Encounter (HOSPITAL_COMMUNITY)
Admission: RE | Admit: 2015-03-29 | Discharge: 2015-03-29 | Disposition: A | Discharge status: Home / Self Care | Payer: Medicare Other | Source: Ambulatory Visit | Attending: Cardiology | Admitting: Cardiology

## 2015-03-29 DIAGNOSIS — I2510 Atherosclerotic heart disease of native coronary artery without angina pectoris: Secondary | ICD-10-CM | POA: Diagnosis not present

## 2015-03-29 LAB — GLUCOSE, CAPILLARY: GLUCOSE-CAPILLARY: 231 mg/dL — AB (ref 70–99)

## 2015-04-01 ENCOUNTER — Encounter (HOSPITAL_COMMUNITY)
Admission: RE | Admit: 2015-04-01 | Discharge: 2015-04-01 | Disposition: A | Discharge status: Home / Self Care | Payer: Medicare Other | Source: Ambulatory Visit | Attending: Cardiology | Admitting: Cardiology

## 2015-04-01 DIAGNOSIS — I2510 Atherosclerotic heart disease of native coronary artery without angina pectoris: Secondary | ICD-10-CM | POA: Diagnosis not present

## 2015-04-01 LAB — GLUCOSE, CAPILLARY: Glucose-Capillary: 206 mg/dL — ABNORMAL HIGH (ref 70–99)

## 2015-04-01 NOTE — Progress Notes (Signed)
Cheryl Gill 64 y.o. female Nutrition Note Spoke with pt. Nutrition Plan and Nutrition Survey goals reviewed with pt. Pt is following Step 2 of the Therapeutic Lifestyle Changes diet. Pt wants to lose wt. Pt has been trying to lose wt by decreasing portion sizes consumed. Pt reports pre-surgery UBW was 203 lb. Pt wt is down 16 lb from reported UBW. Wt loss tips reviewed.  Pt is diabetic. Last A1c indicates blood glucose poorly controlled. Pt states she had her A1c re-checked a few weeks ago "but I won't find out what it was until my MD appointment 4/28." Pt has not checked her CBG's over the past week "because I lost the strips when I moved." Pt encouraged to check with her insurance to see if they would cover more strips. This Probation officer went over Diabetes Education test results. Pt expressed understanding of the information reviewed. Pt aware of nutrition education classes offered. Lab Results  Component Value Date   HGBA1C 9.0* 12/17/2014   Nutrition Diagnosis ? Food-and nutrition-related knowledge deficit related to lack of exposure to information as related to diagnosis of: ? CVD ? DM ? ? Obesity related to excessive energy intake as evidenced by a BMI of 35.4  Nutrition RX/ Estimated Daily Nutrition Needs for: wt loss 1200-1700 Kcal, 30-45 gm fat, 7-13 gm sat fat, 1.1-1.6 gm trans-fat, <1500 mg sodium, 150-175 gm CHO   Nutrition Intervention ? Pt's individual nutrition plan reviewed with pt. ? Benefits of adopting Therapeutic Lifestyle Changes discussed when Medficts reviewed. ? Pt to attend the Portion Distortion class - met; 02/13/15 ? Pt to attend the Diabetes Q & A class - met; 02/22/15 ? Pt to attend the   ? Nutrition I class - met; 03/19/15                 ? Nutrition II class - met;  02/19/15     ? Diabetes Blitz class - met; 02/26/15 ? Continue client-centered nutrition education by RD, as part of interdisciplinary care. Goal(s) ? Pt to identify food quantities necessary to achieve: ?  wt loss to a goal wt of 163-181 lb (74.2-82.4 kg) at graduation from cardiac rehab.  ? CBG concentrations in the normal range or as close to normal as is safely possible. Monitor and Evaluate progress toward nutrition goal with team. Nutrition Risk: Change to Moderate Derek Mound, M.Ed, RD, LDN, CDE 04/01/2015 2:11 PM

## 2015-04-03 ENCOUNTER — Encounter (HOSPITAL_COMMUNITY)
Admission: RE | Admit: 2015-04-03 | Discharge: 2015-04-03 | Disposition: A | Discharge status: Home / Self Care | Payer: Medicare Other | Source: Ambulatory Visit | Attending: Cardiology | Admitting: Cardiology

## 2015-04-03 DIAGNOSIS — I2510 Atherosclerotic heart disease of native coronary artery without angina pectoris: Secondary | ICD-10-CM | POA: Diagnosis not present

## 2015-04-03 LAB — GLUCOSE, CAPILLARY: Glucose-Capillary: 203 mg/dL — ABNORMAL HIGH (ref 70–99)

## 2015-04-05 ENCOUNTER — Telehealth (HOSPITAL_COMMUNITY): Payer: Self-pay | Admitting: Internal Medicine

## 2015-04-05 ENCOUNTER — Encounter (HOSPITAL_COMMUNITY): Payer: Medicare Other

## 2015-04-08 ENCOUNTER — Encounter (HOSPITAL_COMMUNITY): Payer: Medicare Other

## 2015-04-10 ENCOUNTER — Encounter (HOSPITAL_COMMUNITY)
Admission: RE | Admit: 2015-04-10 | Discharge: 2015-04-10 | Disposition: A | Discharge status: Home / Self Care | Payer: Medicare Other | Source: Ambulatory Visit | Attending: Cardiology | Admitting: Cardiology

## 2015-04-10 DIAGNOSIS — I2510 Atherosclerotic heart disease of native coronary artery without angina pectoris: Secondary | ICD-10-CM | POA: Diagnosis not present

## 2015-04-10 LAB — GLUCOSE, CAPILLARY: GLUCOSE-CAPILLARY: 204 mg/dL — AB (ref 70–99)

## 2015-04-12 ENCOUNTER — Encounter (HOSPITAL_COMMUNITY): Payer: Medicare Other

## 2015-04-15 ENCOUNTER — Encounter (HOSPITAL_COMMUNITY)
Admission: RE | Admit: 2015-04-15 | Discharge: 2015-04-15 | Disposition: A | Discharge status: Home / Self Care | Payer: Medicare Other | Source: Ambulatory Visit | Attending: Cardiology | Admitting: Cardiology

## 2015-04-15 DIAGNOSIS — Z951 Presence of aortocoronary bypass graft: Secondary | ICD-10-CM | POA: Diagnosis not present

## 2015-04-15 DIAGNOSIS — I2510 Atherosclerotic heart disease of native coronary artery without angina pectoris: Secondary | ICD-10-CM | POA: Insufficient documentation

## 2015-04-16 LAB — GLUCOSE, CAPILLARY: GLUCOSE-CAPILLARY: 258 mg/dL — AB (ref 70–99)

## 2015-04-17 ENCOUNTER — Encounter (HOSPITAL_COMMUNITY): Payer: Medicare Other

## 2015-04-17 ENCOUNTER — Emergency Department (INDEPENDENT_AMBULATORY_CARE_PROVIDER_SITE_OTHER)
Admission: EM | Admit: 2015-04-17 | Discharge: 2015-04-17 | Disposition: A | Discharge status: Home / Self Care | Payer: Medicare Other | Source: Home / Self Care | Attending: Family Medicine | Admitting: Family Medicine

## 2015-04-17 ENCOUNTER — Encounter (HOSPITAL_COMMUNITY): Payer: Self-pay | Admitting: Emergency Medicine

## 2015-04-17 DIAGNOSIS — H6692 Otitis media, unspecified, left ear: Secondary | ICD-10-CM | POA: Diagnosis not present

## 2015-04-17 LAB — POCT RAPID STREP A: Streptococcus, Group A Screen (Direct): NEGATIVE

## 2015-04-17 MED ORDER — ACETAMINOPHEN 325 MG PO TABS
650.0000 mg | ORAL_TABLET | Freq: Once | ORAL | Status: AC
  Administered 2015-04-17: 650 mg via ORAL

## 2015-04-17 MED ORDER — AMOXICILLIN 500 MG PO CAPS: 500.0000 mg | ORAL_CAPSULE | Freq: Three times a day (TID) | ORAL | Status: DC

## 2015-04-17 MED ORDER — ACETAMINOPHEN 325 MG PO TABS
ORAL_TABLET | ORAL | Status: AC
  Filled 2015-04-17: qty 2

## 2015-04-17 MED ORDER — ACETAMINOPHEN 325 MG PO TABS
ORAL_TABLET | ORAL | Status: AC
  Filled 2015-04-17: qty 1

## 2015-04-17 NOTE — ED Provider Notes (Signed)
CSN: [id number]     Arrival date & time 04/17/15  1919 History   First MD Initiated Contact with Patient 04/17/15 2024     No chief complaint on file.  (Consider location/radiation/quality/duration/timing/severity/associated sxs/prior Treatment) Patient is a 64 y.o. female presenting with pharyngitis.  Sore Throat This is a new problem. The current episode started 2 days ago. The problem occurs constantly. The problem has been gradually worsening. Associated symptoms comments: +fever, chills, left ear pain.    Past Medical History  Diagnosis Date  . Diabetes mellitus type 2, insulin dependent   . Essential hypertension   . CKD (chronic kidney disease), stage III   . Obesity     BMI ~35-36  . Hyperlipidemia with target LDL less than 70 01/28/2014  . NSTEMI (non-ST elevated myocardial infarction) 12/18/2014    Echo 1/6: EF 60-65%, no Regional WMA, Gr 1 DD  . 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 Metabolic syndrome: DM, HTN, Obesity as well as HLD 12/18/2014  . Seasonal allergies   . Asthma   . Colitis    Past Surgical History  Procedure Laterality Date  . Abdominal hysterectomy    . Tonsillectomy    . Left heart catheterization with coronary angiogram N/A 12/18/2014    Procedure: LEFT HEART CATHETERIZATION WITH CORONARY ANGIOGRAM;  Surgeon: Peter M [name], MD;  [facility];  Service: Cardiovascular;  Laterality: N/A;  . Coronary artery bypass graft N/A 12/21/2014    Procedure: CORONARY ARTERY BYPASS GRAFTING (CABG) TIMES TWO USING LEFT INTERNAL MAMMARY AND RIGHT SAPHENOUS LEG VEIN HARVESTED ENDOSCOPICALLY;  Surgeon: Loreli SlotSteven C Hendrickson, MD;  [facility];  Service: Open Heart Surgery;  Laterality: N/A;  . Tee without cardioversion N/A 12/21/2014    Procedure: TRANSESOPHAGEAL ECHOCARDIOGRAM (TEE);  Surgeon: Loreli SlotSteven C Hendrickson, MD;  [facility];  Service: Open Heart Surgery;  Laterality: N/A;   . [place] echocardiography  04/09/2010    Possible small patent foramen ovale suspected;trace MR, mild TR.  [name] Kitchen. Nm myocar perf wall motion  04/09/2010    Abnormal study - appears to be a small area of apical infarction. Reversible ischemia is not seen.   Family History  Problem Relation Age of Onset  . Pancreatic cancer Mother   . Stroke Father   . CAD Mother     Open heart surgery 60s-70s  . Heart disease Sister     Unclear details   History  Substance Use Topics  . Smoking status: Never Smoker   . Smokeless tobacco: Never Used  . Alcohol Use: No   OB History    Gravida Para Term Preterm AB TAB SAB Ectopic Multiple Living   5 4 4  1  1   4      Review of Systems  Constitutional: Positive for fever and chills.  HENT: Positive for ear pain and sore throat. Negative for congestion, hearing loss, postnasal drip, rhinorrhea, trouble swallowing and voice change.   Eyes: Negative.   Respiratory: Negative.   Cardiovascular: Negative.   Gastrointestinal: Negative.   Genitourinary: Negative.   Musculoskeletal: Negative.   Skin: Negative.     Allergies  Ace inhibitors; Lisinopril; Mushroom extract complex; and Tetanus toxoids  Home Medications   Prior to Admission medications   Medication Sig Start Date End Date Taking? Authorizing Provider  albuterol (PROVENTIL HFA;VENTOLIN HFA) 108 (90 BASE) MCG/ACT inhaler Inhale 2 puffs  into the lungs every 6 (six) hours as needed. For shortness of breath or wheezing    Historical Provider, MD  amoxicillin (AMOXIL) 500 MG capsule Take 1 capsule (500 mg total) by mouth 3 (three) times daily. 04/17/15   Mathis Fare Zohaib Heeney, PA  aspirin EC 325 MG EC tablet Take 1 tablet (325 mg total) by mouth daily. 12/27/14   Erin R Barrett, PA-C  ciprofloxacin (CIPRO) 500 MG tablet Take 1 tablet (500 mg total) by mouth 2 (two) times daily. For 11 more days. 03/08/15   Osvaldo Shipper, MD  colchicine 0.6 MG tablet Take 0.6 mg by mouth 2 (two) times daily as needed  (gout).     Historical Provider, MD  glipiZIDE (GLUCOTROL) 10 MG tablet Take 10 mg by mouth 2 (two) times daily before a meal.    Historical Provider, MD  hydrochlorothiazide (HYDRODIURIL) 25 MG tablet Take 25 mg by mouth daily.    Historical Provider, MD  insulin detemir (LEVEMIR) 100 UNIT/ML injection Inject 10 Units into the skin at bedtime.    Historical Provider, MD  linagliptin (TRADJENTA) 5 MG TABS tablet Take 5 mg by mouth daily.    Historical Provider, MD  loratadine (CLARITIN) 10 MG tablet Take 10 mg by mouth daily as needed for allergies.    Historical Provider, MD  metoprolol tartrate (LOPRESSOR) 25 MG tablet Take 1 tablet (25 mg total) by mouth 2 (two) times daily. 12/27/14   Erin R Barrett, PA-C  metroNIDAZOLE (FLAGYL) 500 MG tablet Take 1 tablet (500 mg total) by mouth every 8 (eight) hours. For 11 more days. 03/08/15   Osvaldo Shipper, MD  ondansetron (ZOFRAN ODT) 4 MG disintegrating tablet Take 1 tablet (4 mg total) by mouth every 8 (eight) hours as needed for nausea or vomiting. 03/08/15   Osvaldo Shipper, MD  oxyCODONE (OXY IR/ROXICODONE) 5 MG immediate release tablet Take 1-2 tablets (5-10 mg total) by mouth every 3 (three) hours as needed for severe pain. 12/27/14   Erin R Barrett, PA-C  simvastatin (ZOCOR) 10 MG tablet Take 10 mg by mouth at bedtime.    Historical Provider, MD  traMADol (ULTRAM) 50 MG tablet Take 1-2 tablets (50-100 mg total) by mouth every 4 (four) hours as needed for moderate pain. 12/27/14   Erin R Barrett, PA-C   BP 146/90 mmHg  Pulse 100  Temp(Src) 101.1 F (38.4 C) (Oral)  Resp 16  SpO2 100% Physical Exam  Constitutional: She is oriented to person, place, and time. She appears well-developed and well-nourished. No distress.  HENT:  Head: Normocephalic and atraumatic.  Right Ear: Hearing, tympanic membrane, external ear and ear canal normal.  Left Ear: Hearing normal. There is tenderness. No drainage. No mastoid tenderness. Tympanic membrane is injected  and erythematous. Tympanic membrane is not perforated.  No middle ear effusion.  Nose: Nose normal.  Mouth/Throat: Uvula is midline and mucous membranes are normal. Posterior oropharyngeal erythema present. No oropharyngeal exudate, posterior oropharyngeal edema or tonsillar abscesses.  Moderate erythema in left ear canal  Eyes: Conjunctivae are normal. Right eye exhibits no discharge. Left eye exhibits no discharge. No scleral icterus.  Neck: Normal range of motion. Neck supple.  Cardiovascular: Normal rate, regular rhythm and normal heart sounds.   Pulmonary/Chest: Effort normal and breath sounds normal. No stridor.  Musculoskeletal: Normal range of motion.  Lymphadenopathy:    She has no cervical adenopathy.  Neurological: She is alert and oriented to person, place, and time.  Skin: Skin is warm and dry.  No rash noted.  Psychiatric: She has a normal mood and affect. Her behavior is normal.  Nursing note and vitals reviewed.   ED Course  Procedures (including critical care time) Labs Review Labs Reviewed  POCT RAPID STREP A ([facility]RG CARE ONLY)    Imaging Review No results found.   MDM   1. Acute left otitis media, recurrence not specified, unspecified otitis media type    Rapid strep negative Will send for for 3 day culture Left AOM. Will treat with Amoxicillin and advise follow up with PCP if no improvement   Ria ClockJennifer Lee H Dan Scearce, [place] 04/17/15 2112

## 2015-04-17 NOTE — Discharge Instructions (Signed)

## 2015-04-17 NOTE — ED Notes (Signed)
C/o cold sx See physician note

## 2015-04-19 ENCOUNTER — Encounter (HOSPITAL_COMMUNITY): Admission: RE | Admit: 2015-04-19 | Payer: Medicare Other | Source: Ambulatory Visit

## 2015-04-20 LAB — CULTURE, GROUP A STREP: Strep A Culture: NEGATIVE

## 2015-04-22 ENCOUNTER — Encounter (HOSPITAL_COMMUNITY)
Admission: RE | Admit: 2015-04-22 | Discharge: 2015-04-22 | Disposition: A | Discharge status: Home / Self Care | Payer: Medicare Other | Source: Ambulatory Visit | Attending: Cardiology | Admitting: Cardiology

## 2015-04-22 DIAGNOSIS — I2510 Atherosclerotic heart disease of native coronary artery without angina pectoris: Secondary | ICD-10-CM | POA: Diagnosis not present

## 2015-04-22 LAB — GLUCOSE, CAPILLARY: GLUCOSE-CAPILLARY: 174 mg/dL — AB (ref 70–99)

## 2015-04-24 ENCOUNTER — Encounter (HOSPITAL_COMMUNITY)
Admission: RE | Admit: 2015-04-24 | Discharge: 2015-04-24 | Disposition: A | Discharge status: Home / Self Care | Payer: Medicare Other | Source: Ambulatory Visit | Attending: Cardiology | Admitting: Cardiology

## 2015-04-24 DIAGNOSIS — I2510 Atherosclerotic heart disease of native coronary artery without angina pectoris: Secondary | ICD-10-CM | POA: Diagnosis not present

## 2015-04-24 LAB — GLUCOSE, CAPILLARY: Glucose-Capillary: 224 mg/dL — ABNORMAL HIGH (ref 70–99)

## 2015-04-26 ENCOUNTER — Encounter (HOSPITAL_COMMUNITY)
Admission: RE | Admit: 2015-04-26 | Discharge: 2015-04-26 | Disposition: A | Discharge status: Home / Self Care | Payer: Medicare Other | Source: Ambulatory Visit | Attending: Cardiology | Admitting: Cardiology

## 2015-04-26 DIAGNOSIS — I2510 Atherosclerotic heart disease of native coronary artery without angina pectoris: Secondary | ICD-10-CM | POA: Diagnosis not present

## 2015-04-29 ENCOUNTER — Encounter (HOSPITAL_COMMUNITY)
Admission: RE | Admit: 2015-04-29 | Discharge: 2015-04-29 | Disposition: A | Discharge status: Home / Self Care | Payer: Medicare Other | Source: Ambulatory Visit | Attending: Cardiology | Admitting: Cardiology

## 2015-04-29 DIAGNOSIS — I2510 Atherosclerotic heart disease of native coronary artery without angina pectoris: Secondary | ICD-10-CM | POA: Diagnosis not present

## 2015-04-29 LAB — GLUCOSE, CAPILLARY
Glucose-Capillary: 221 mg/dL — ABNORMAL HIGH (ref 65–99)
Glucose-Capillary: 289 mg/dL — ABNORMAL HIGH (ref 65–99)

## 2015-04-30 ENCOUNTER — Other Ambulatory Visit: Payer: Self-pay | Admitting: Physician Assistant

## 2015-05-01 ENCOUNTER — Encounter (HOSPITAL_COMMUNITY)
Admission: RE | Admit: 2015-05-01 | Discharge: 2015-05-01 | Disposition: A | Discharge status: Home / Self Care | Payer: Medicare Other | Source: Ambulatory Visit | Attending: Cardiology | Admitting: Cardiology

## 2015-05-01 DIAGNOSIS — I2510 Atherosclerotic heart disease of native coronary artery without angina pectoris: Secondary | ICD-10-CM | POA: Diagnosis not present

## 2015-05-01 LAB — GLUCOSE, CAPILLARY: Glucose-Capillary: 304 mg/dL — ABNORMAL HIGH (ref 65–99)

## 2015-05-03 ENCOUNTER — Encounter (HOSPITAL_COMMUNITY)
Admission: RE | Admit: 2015-05-03 | Discharge: 2015-05-03 | Disposition: A | Discharge status: Home / Self Care | Payer: Medicare Other | Source: Ambulatory Visit | Attending: Cardiology | Admitting: Cardiology

## 2015-05-03 ENCOUNTER — Telehealth: Payer: Self-pay | Admitting: Cardiology

## 2015-05-03 ENCOUNTER — Encounter (HOSPITAL_COMMUNITY): Payer: Self-pay

## 2015-05-03 DIAGNOSIS — I2510 Atherosclerotic heart disease of native coronary artery without angina pectoris: Secondary | ICD-10-CM | POA: Diagnosis not present

## 2015-05-03 LAB — GLUCOSE, CAPILLARY: Glucose-Capillary: 187 mg/dL — ABNORMAL HIGH (ref 65–99)

## 2015-05-03 MED ORDER — METOPROLOL TARTRATE 25 MG PO TABS: 25.0000 mg | ORAL_TABLET | Freq: Two times a day (BID) | ORAL | Status: DC

## 2015-05-03 NOTE — Telephone Encounter (Signed)
Pt wants to know if she is suppose to continue taking the Metoprolol? If so she needs it called to [facility]-[phone].

## 2015-05-03 NOTE — Telephone Encounter (Signed)
Called patient, she affirmed she needs refill. Refill submitted to patient's preferred pharmacy. Informed patient. Pt voiced understanding, no other stated concerns at this time.

## 2015-05-03 NOTE — Progress Notes (Addendum)
Pt graduated from cardiac rehab program today with completion of 24 exercise sessions in Phase II. Pt attendance altered by medical illness and poor glycemic control.    Medication list reconciled. Repeat  PHQ score-0.    Pt has made significant lifestyle changes and should be commended for her  success. Pt feels she has achieved her goals during cardiac rehab, which include weight maintenance and increased strength.   Pt plans to continue exercising on her own at [employer].

## 2015-05-06 ENCOUNTER — Encounter (HOSPITAL_COMMUNITY): Payer: Medicare Other

## 2015-05-08 ENCOUNTER — Other Ambulatory Visit: Payer: Self-pay

## 2015-05-08 DIAGNOSIS — Z1231 Encounter for screening mammogram for malignant neoplasm of breast: Secondary | ICD-10-CM

## 2015-05-21 ENCOUNTER — Encounter: Payer: Medicare Other | Admitting: Cardiovascular Disease

## 2015-05-30 ENCOUNTER — Ambulatory Visit (INDEPENDENT_AMBULATORY_CARE_PROVIDER_SITE_OTHER): Payer: Medicare Other | Admitting: Cardiology

## 2015-05-30 ENCOUNTER — Encounter: Payer: Self-pay | Admitting: Cardiology

## 2015-05-30 VITALS — BP 118/78 | HR 80 | Ht 62.0 in | Wt 196.6 lb

## 2015-05-30 DIAGNOSIS — E1165 Type 2 diabetes mellitus with hyperglycemia: Secondary | ICD-10-CM

## 2015-05-30 DIAGNOSIS — I2510 Atherosclerotic heart disease of native coronary artery without angina pectoris: Secondary | ICD-10-CM

## 2015-05-30 DIAGNOSIS — Z951 Presence of aortocoronary bypass graft: Secondary | ICD-10-CM

## 2015-05-30 DIAGNOSIS — E785 Hyperlipidemia, unspecified: Secondary | ICD-10-CM

## 2015-05-30 DIAGNOSIS — I249 Acute ischemic heart disease, unspecified: Secondary | ICD-10-CM

## 2015-05-30 DIAGNOSIS — I10 Essential (primary) hypertension: Secondary | ICD-10-CM | POA: Diagnosis not present

## 2015-05-30 DIAGNOSIS — E669 Obesity, unspecified: Secondary | ICD-10-CM

## 2015-05-30 DIAGNOSIS — IMO0002 Reserved for concepts with insufficient information to code with codable children: Secondary | ICD-10-CM

## 2015-05-30 DIAGNOSIS — I214 Non-ST elevation (NSTEMI) myocardial infarction: Secondary | ICD-10-CM

## 2015-05-30 DIAGNOSIS — E8881 Metabolic syndrome: Secondary | ICD-10-CM

## 2015-05-30 NOTE — Progress Notes (Signed)
PCP: Dorrene German, MD  Clinic Note: Chief Complaint  Patient presents with  . Follow-up    10month followup; chest pain-has soreness at top of incision, no shortness of breath, no edema, no pain in legs, no cramping in legs, no lightheadedness, no dizziness; right arm was hurting after surgery, doing better   HPI: Countess Biebel is a 64 y.o. female with a PMH below who presents today for  following her CABG in January. She has hypertension, type 2 diabetes, hypertension and CK D.-3.   January 2016: Admitted for  Non-STEMI  cardiac catheterization revealed severe disease in the proximal LAD that compromised the major diagonal branch. Based on the location of the stenosis and extensive disease --> CABG.   CABGx2: LIMA to LAD and SVG ID 1.   Well preserved EF by echo at the time of grade 1 diastolic function.     Has Completed Cardiac Rehabilitation  Cleared for driving by CT surgery    Interval History:  her major complaint today is discomfort at the very top of her incision that is worse with coughing and certain movements. It is not exacerbated by routine exercise. She has been continuing her workouts 5 d @ gym/week --> TM, bike, eliptical.  with this activity she denies any exertional chest tightness/pressure or dyspnea. The only thing she notices the twinges and pulling at the suture lines and sternal wire sites. Her right arm is feeling will do better since the surgery. She has been troubled somewhat gout but otherwise no major issues. Her primary physician is recently checked labs and told her that her A1c looked much better and labs look good.   No PND, orthopnea or edema. She denies rapid/irregular heartbeat/palpitations or syncope/near syncope, TIA /amaurosis fugax.  ROS: A comprehensive was performed. Review of Systems  Constitutional: Negative for malaise/fatigue.  HENT: Negative for nosebleeds.   Eyes: Negative for blurred vision.  Respiratory: Negative for cough,  shortness of breath and wheezing.   Cardiovascular: Negative for claudication.  Gastrointestinal: Negative for heartburn, blood in stool and melena.  Genitourinary: Negative for hematuria.  Musculoskeletal: Positive for joint pain (right arm pain seems to have improved with rehabilitation. Still has bilateral knee pain and gout pains.). Negative for falls.       Chest wall discomfort postop  Neurological: Positive for dizziness (NO longer notes the positional dizziness). Negative for headaches.  Endo/Heme/Allergies: Negative.   Psychiatric/Behavioral: Negative.   All other systems reviewed and are negative.  Past Medical History  Diagnosis Date  . Diabetes mellitus type 2, insulin dependent   . Essential hypertension   . CKD (chronic kidney disease), stage III   . Obesity     BMI ~35-36  . Hyperlipidemia with target LDL less than 70 01/28/2014  . NSTEMI (non-ST elevated myocardial infarction) 12/18/2014    Echo 1/6: EF 60-65%, no Regional WMA, Gr 1 DD  . 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 Metabolic syndrome: DM, HTN, Obesity as well as HLD 12/18/2014  . Seasonal allergies   . Asthma   . Colitis     Past Surgical History  Procedure Laterality Date  . Abdominal hysterectomy    . Tonsillectomy    . Left heart catheterization with coronary angiogram N/A 12/18/2014    Procedure: LEFT HEART CATHETERIZATION WITH CORONARY ANGIOGRAM;  Surgeon: Peter M Swaziland, MD;  Location: [facility] CATH LAB;  Service: Cardiovascular;  Laterality: N/A;  . Coronary artery bypass graft N/A 12/21/2014    Procedure: CORONARY ARTERY BYPASS GRAFTING (CABG) TIMES TWO USING LEFT INTERNAL MAMMARY AND RIGHT SAPHENOUS LEG VEIN HARVESTED ENDOSCOPICALLY;  Surgeon: Loreli Slot, MD;  Location: [facility] OR;  Service: Open Heart Surgery;  Laterality: N/A;  . Tee without cardioversion N/A 12/21/2014    Procedure: TRANSESOPHAGEAL ECHOCARDIOGRAM  (TEE);  Surgeon: Loreli Slot, MD;  Location: [facility] OR;  Service: Open Heart Surgery;  Laterality: N/A;  . US echocardiography  04/09/2010    Possible small patent foramen ovale suspected;trace MR, mild TR.  [name] Kitchen Nm myocar perf wall motion  04/09/2010    Abnormal study - appears to be a small area of apical infarction. Reversible ischemia is not seen.    Current Outpatient Prescriptions on File Prior to Visit  Medication Sig Dispense Refill  . albuterol (PROVENTIL HFA;VENTOLIN HFA) 108 (90 BASE) MCG/ACT inhaler Inhale 2 puffs into the lungs every 6 (six) hours as needed. For shortness of breath or wheezing    . aspirin EC 325 MG EC tablet Take 1 tablet (325 mg total) by mouth daily. 30 tablet 0  . colchicine 0.6 MG tablet Take 0.6 mg by mouth 2 (two) times daily as needed (gout).     [name] Kitchen glipiZIDE (GLUCOTROL) 10 MG tablet Take 10 mg by mouth 2 (two) times daily before a meal.    . hydrochlorothiazide (HYDRODIURIL) 25 MG tablet Take 25 mg by mouth daily.    . insulin detemir (LEVEMIR) 100 UNIT/ML injection Inject 10 Units into the skin at bedtime.    [name] Kitchen linagliptin (TRADJENTA) 5 MG TABS tablet Take 5 mg by mouth daily.    [name] Kitchen loratadine (CLARITIN) 10 MG tablet Take 10 mg by mouth daily as needed for allergies.    . metoprolol tartrate (LOPRESSOR) 25 MG tablet Take 1 tablet (25 mg total) by mouth 2 (two) times daily. 60 tablet 5  . ondansetron (ZOFRAN ODT) 4 MG disintegrating tablet Take 1 tablet (4 mg total) by mouth every 8 (eight) hours as needed for nausea or vomiting. 20 tablet 0  . oxyCODONE (OXY IR/ROXICODONE) 5 MG immediate release tablet Take 1-2 tablets (5-10 mg total) by mouth every 3 (three) hours as needed for severe pain. 30 tablet 0  . simvastatin (ZOCOR) 10 MG tablet Take 10 mg by mouth at bedtime.    . traMADol (ULTRAM) 50 MG tablet Take 1-2 tablets (50-100 mg total) by mouth every 4 (four) hours as needed for moderate pain. 30 tablet 0   No current facility-administered medications  on file prior to visit.   Allergies  Allergen Reactions  . Ace Inhibitors Anaphylaxis  . Lisinopril Anaphylaxis  . Mushroom Extract Complex Swelling  . Tetanus Toxoids Nausea And Vomiting and Swelling   History  Substance Use Topics  . Smoking status: Never Smoker   . Smokeless tobacco: Never Used  . Alcohol Use: No   Family History  Problem Relation Age of Onset  . Pancreatic cancer Mother   . Stroke Father   . CAD Mother     Open heart surgery 60s-70s  . Heart disease Sister     Unclear details    Wt Readings from Last 3 Encounters:  05/30/15 89.177 kg (196 lb 9.6 oz)  03/04/15 84.369 kg (186 lb)  02/12/15 86.274 kg (190 lb 3.2 oz)    PHYSICAL EXAM BP 118/78 mmHg  Pulse 80  Ht 5\' 2"  (1.575 m)  Wt 89.177 kg (196  lb 9.6 oz)  BMI 35.95 kg/m2 General appearance: alert, cooperative, appears stated age, no distress and moderately obese HEENT: [place]/AT, EOMI, MMM, anicteric sclera Neck: no adenopathy, no carotid bruit and no JVD Lungs: clear to auscultation bilaterally, normal percussion bilaterally and non-labored Heart: regular rate and rhythm, S1, S2 normal, no murmur, click, rub or gallop; non-displaced PMI; well healed sternotomy scar with mild tenderness to palpation along sternal wires -   Especially the most cranial  Sternal wire at the top of the sternal Abdomen: soft, non-tender; bowel sounds normal; no masses,  no organomegaly; No HJR Extremities: extremities normal, atraumatic, no cyanosis or edema. Pulses: 2+ and symmetric; Skin: normal and mobility and turgor normal  Neurologic: Mental status: Alert, oriented, thought content appropriate; Cranial nerves: normal (II-XII grossly intact)   Adult ECG Report  Rate: 93 ;  Rhythm: normal sinus rhythm and Borderline low voltage. Lateral T wave inversions  Have normalized to almost T-wave flattening.   Narrative Interpretation: stable EKG. There are  minimal T wave inversions in leads 1 and aVL consistent with initial  pre-and postop EKGs  Recent Labs:     Chemistry      Component Value Date/Time   NA 142 03/08/2015 0425   K 4.5 03/08/2015 0425   CL 111 03/08/2015 0425   CO2 22 03/08/2015 0425   BUN 16 03/08/2015 0425   CREATININE 1.43* 03/08/2015 0425      Component Value Date/Time   CALCIUM 8.9 03/08/2015 0425   ALKPHOS 82 03/04/2015 2047   AST 21 03/04/2015 2047   ALT 20 03/04/2015 2047   BILITOT 0.5 03/04/2015 2047     Lab Results  Component Value Date   CHOL 110 12/18/2014   HDL 37* 12/18/2014   LDLCALC 62 12/18/2014   TRIG 56 12/18/2014   CHOLHDL 3.0 12/18/2014   Recent studies reviewed:   no new studies  ASSESSMENT / PLAN: Problem List Items Addressed This Visit    Coronary artery disease involving native coronary artery of native heart without angina pectoris - Primary (Chronic)     Relatively stable status post CABG. She had severe ostial LAD disease also compromising the diagonal.  Referred for two-vessel CABG. Doing very well with no active symptoms.  REMAINS ON ASPIRIN, STATIN AND BETA BLOCKER.   CONTINUE STABLE MEDICATIONS.      Relevant Orders   EKG 12-Lead (Completed)   Diabetes mellitus type 2, uncontrolled (Chronic)   Essential hypertension (Chronic)     Stable blood pressures on data blocker and HCTZ. At this point with normal EF, no clear indication to add ACE inhibitor or ARB the sides her being a diabetic. Monitor pressures, if they do seem to elevate would start ACE inhibitor/ARB      Relevant Orders   EKG 12-Lead (Completed)   Hyperlipidemia with target LDL less than 70 (Chronic)     Goal LDL  By her January lipids. HDL just borderline. But overall cholesterol was low on low-dose statin.  Apparently is due to have labs checked by PCP. Building I see what her lipids look like wall not in the setting of acute coronary syndrome.      Metabolic syndrome: DM, HTN, Obesity as well as HLD (Chronic)     Unfortunately she's gained back the weight that she lost  cardiac rehabilitation. She is staying active with exercise. We just discussed the need for dietary modification.  He is on statin and her A1c apparently has improved.      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  Okay to go back to exercising just would not lift heavy weights to avoid exacerbating her sternal pain.      Relevant Orders   EKG 12-Lead (Completed)     Current medicines are reviewed at length with the patient today.  No new medications  The following changes have been made:   Okay to complete exercise just would not lift more than 20 pounds  Labs/ tests ordered today include:  Orders Placed This Encounter  Procedures  . EKG 12-Lead   No orders of the defined types were placed in this encounter.     Followup: 3 months   HARDING, Piedad Climes, M.D., M.S. Interventional Cardiologist   Pager # [phone]

## 2015-05-30 NOTE — Patient Instructions (Signed)
OKAY TO  EXERCISE - DO NOT USE WEIGHTS OVER 20 LBS  NO CHANGE IN MEDICATIONS   Your physician wants you to follow-up in 6 MONTH DR HARDING.  You will receive a reminder letter in the mail two months in advance. If you don't receive a letter, please call our office to schedule the follow-up appointment.

## 2015-06-01 NOTE — Assessment & Plan Note (Signed)
Stable blood pressures on data blocker and HCTZ. At this point with normal EF, no clear indication to add ACE inhibitor or ARB the sides her being a diabetic. Monitor pressures, if they do seem to elevate would start ACE inhibitor/ARB

## 2015-06-01 NOTE — Assessment & Plan Note (Signed)
Unfortunately she's gained back the weight that she lost cardiac rehabilitation. She is staying active with exercise. We just discussed the need for dietary modification.  He is on statin and her A1c apparently has improved.

## 2015-06-01 NOTE — Assessment & Plan Note (Signed)
Goal LDL  By her January lipids. HDL just borderline. But overall cholesterol was low on low-dose statin.  Apparently is due to have labs checked by PCP. Building I see what her lipids look like wall not in the setting of acute coronary syndrome.

## 2015-06-01 NOTE — Assessment & Plan Note (Signed)
OVERALL HEALING WELL, BUT STILL HAS SOME MUSCULOSKELETAL PAINS LATEST STERNAL WIRES.  Okay to go back to exercising just would not lift heavy weights to avoid exacerbating her sternal pain.

## 2015-06-01 NOTE — Assessment & Plan Note (Addendum)
Relatively stable status post CABG. She had severe ostial LAD disease also compromising the diagonal.  Referred for two-vessel CABG. Doing very well with no active symptoms.  REMAINS ON ASPIRIN, STATIN AND BETA BLOCKER.   CONTINUE STABLE MEDICATIONS.

## 2015-06-06 ENCOUNTER — Ambulatory Visit: Payer: Medicare Other

## 2015-07-22 ENCOUNTER — Ambulatory Visit
Admission: RE | Admit: 2015-07-22 | Discharge: 2015-07-22 | Disposition: A | Discharge status: Home / Self Care | Payer: Medicare Other | Source: Ambulatory Visit

## 2015-07-22 DIAGNOSIS — Z1231 Encounter for screening mammogram for malignant neoplasm of breast: Secondary | ICD-10-CM

## 2015-11-02 ENCOUNTER — Encounter (HOSPITAL_COMMUNITY): Payer: Self-pay | Admitting: Emergency Medicine

## 2015-11-02 ENCOUNTER — Emergency Department (INDEPENDENT_AMBULATORY_CARE_PROVIDER_SITE_OTHER)
Admission: EM | Admit: 2015-11-02 | Discharge: 2015-11-02 | Disposition: A | Discharge status: Home / Self Care | Payer: Medicare Other | Source: Home / Self Care | Attending: Family Medicine | Admitting: Family Medicine

## 2015-11-02 DIAGNOSIS — J019 Acute sinusitis, unspecified: Secondary | ICD-10-CM

## 2015-11-02 MED ORDER — AMOXICILLIN 500 MG PO CAPS: 500.0000 mg | ORAL_CAPSULE | Freq: Two times a day (BID) | ORAL | Status: DC

## 2015-11-02 NOTE — ED Notes (Signed)
Complains of coughing, ear pain, sore throat, reports symptoms for 10 days.

## 2015-11-02 NOTE — Discharge Instructions (Signed)
Sinus Rinse  WHAT IS A SINUS RINSE?  A sinus rinse is a home treatment. It rinses your sinuses with a mixture of salt and water (saline solution). Sinuses are air-filled spaces in your skull behind the bones of your face and forehead. They open into your nasal cavity.  To do a sinus rinse, you will need:  · Saline solution.  · Neti pot or spray bottle. This releases the saline solution into your nose and through your sinuses. You can buy neti pots and spray bottles at:    Your local pharmacy.    A health food store.    Online.  WHEN WOULD I DO A SINUS RINSE?   A sinus rinse can help to clear your nasal cavity. It can clear:   · Mucus.  · Dirt.  · Dust.  · Pollen.  You may do a sinus rinse when you have:  · A cold.  · A virus.  · Allergies.  · A sinus infection.  · A stuffy nose.  If you are considering a sinus rinse:  · Ask your child's doctor before doing a sinus rinse on your child.  · Do not do a sinus rinse if you have had:  ¨ Ear or nasal surgery.  ¨ An ear infection.  ¨ Blocked ears.  HOW DO I DO A SINUS RINSE?   · Wash your hands.  · Disinfect your device using the directions that came with the device.  · Dry your device.  · Use the solution that comes with your device or one that is sold separately in stores. Follow the mixing directions on the package.  · Fill your device with the amount of saline solution as stated in the device instructions.  · Stand over a sink and tilt your head sideways over the sink.  · Place the spout of the device in your upper nostril (the one closer to the ceiling).  · Gently pour or squeeze the saline solution into the nasal cavity. The liquid should drain to the lower nostril if you are not too congested.  · Gently blow your nose. Blowing too hard may cause ear pain.  · Repeat in the other nostril.  · Clean and rinse your device with clean water.  · Air-dry your device.  ARE THERE RISKS OF A SINUS RINSE?   Sinus rinse is normally very safe and helpful. However, there are a few  risks, which include:   · A burning feeling in the sinuses. This may happen if you do not make the saline solution as instructed. Make sure to follow all directions when making the saline solution.  · Infection from unclean water. This is rare, but possible.  · Nasal irritation.     This information is not intended to replace advice given to you by your health care provider. Make sure you discuss any questions you have with your health care provider.     Document Released: 06/27/2014 Document Reviewed: 06/27/2014  Elsevier Interactive Patient Education ©2016 [employer]

## 2015-11-02 NOTE — ED Provider Notes (Signed)
CSN: [id number]     Arrival date & time 11/02/15  1310 History   First MD Initiated Contact with Patient 11/02/15 1440     Chief Complaint  Patient presents with  . URI   (Consider location/radiation/quality/duration/timing/severity/associated sxs/prior Treatment) Patient is a 64 y.o. female presenting with URI. The history is provided by the patient.  URI Presenting symptoms: congestion, cough, ear pain, rhinorrhea and sore throat   Presenting symptoms: no fever   Severity:  Moderate Onset quality:  Gradual Duration:  2 weeks Progression:  Worsening Chronicity:  Recurrent (She had similar presentation 5 wks ago and was treated with Zpak but it did not help a lot.) Relieved by:  Nothing Worsened by:  Nothing tried Ineffective treatments: Zyrtec. nasal spray. Associated symptoms: arthralgias, headaches, sinus pain, sneezing and wheezing   Risk factors: being elderly, chronic respiratory disease and recent illness   Risk factors: no sick contacts     Past Medical History  Diagnosis Date  . Diabetes mellitus type 2, insulin dependent (HCC)   . Essential hypertension   . CKD (chronic kidney disease), stage III   . Obesity     BMI ~35-36  . Hyperlipidemia with target LDL less than 70 01/28/2014  . NSTEMI (non-ST elevated myocardial infarction) (HCC) 12/18/2014    Echo 1/6: EF 60-65%, no Regional WMA, Gr 1 DD  . 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 Metabolic syndrome: DM, HTN, Obesity as well as HLD 12/18/2014  . Seasonal allergies   . Asthma   . Colitis    Past Surgical History  Procedure Laterality Date  . Abdominal hysterectomy    . Tonsillectomy    . Left heart catheterization with coronary angiogram N/A 12/18/2014    Procedure: LEFT HEART CATHETERIZATION WITH CORONARY ANGIOGRAM;  Surgeon: Peter M Swaziland, MD;  Location: [facility] CATH LAB;  Service: Cardiovascular;  Laterality: N/A;  . Coronary  artery bypass graft N/A 12/21/2014    Procedure: CORONARY ARTERY BYPASS GRAFTING (CABG) TIMES TWO USING LEFT INTERNAL MAMMARY AND RIGHT SAPHENOUS LEG VEIN HARVESTED ENDOSCOPICALLY;  Surgeon: Loreli Slot, MD;  Location: [facility] OR;  Service: Open Heart Surgery;  Laterality: N/A;  . Tee without cardioversion N/A 12/21/2014    Procedure: TRANSESOPHAGEAL ECHOCARDIOGRAM (TEE);  Surgeon: Loreli Slot, MD;  Location: [facility] OR;  Service: Open Heart Surgery;  Laterality: N/A;  . US echocardiography  04/09/2010    Possible small patent foramen ovale suspected;trace MR, mild TR.  [name] Kitchen Nm myocar perf wall motion  04/09/2010    Abnormal study - appears to be a small area of apical infarction. Reversible ischemia is not seen.   Family History  Problem Relation Age of Onset  . Pancreatic cancer Mother   . Stroke Father   . CAD Mother     Open heart surgery 60s-70s  . Heart disease Sister     Unclear details   Social History  Substance Use Topics  . Smoking status: Never Smoker   . Smokeless tobacco: Never Used  . Alcohol Use: No   OB History    Gravida Para Term Preterm AB TAB SAB Ectopic Multiple Living   Review of Systems  Constitutional: Negative for fever.  HENT: Positive for congestion, ear pain, postnasal drip, rhinorrhea, sneezing and sore throat.   Respiratory: Positive for  cough and wheezing.   Musculoskeletal: Positive for arthralgias.  Neurological: Positive for headaches.  All other systems reviewed and are negative.   Allergies  Ace inhibitors; Lisinopril; Mushroom extract complex; and Tetanus toxoids  Home Medications   Prior to Admission medications   Medication Sig Start Date End Date Taking? Authorizing Provider  albuterol (PROVENTIL HFA;VENTOLIN HFA) 108 (90 BASE) MCG/ACT inhaler Inhale 2 puffs into the lungs every 6 (six) hours as needed. For shortness of breath or wheezing    Historical Provider, MD  aspirin EC 325 MG EC tablet Take 1 tablet (325  mg total) by mouth daily. 12/27/14   Erin R Barrett, PA-C  colchicine 0.6 MG tablet Take 0.6 mg by mouth 2 (two) times daily as needed (gout).     Historical Provider, MD  glipiZIDE (GLUCOTROL) 10 MG tablet Take 10 mg by mouth 2 (two) times daily before a meal.    Historical Provider, MD  hydrochlorothiazide (HYDRODIURIL) 25 MG tablet Take 25 mg by mouth daily.    Historical Provider, MD  insulin detemir (LEVEMIR) 100 UNIT/ML injection Inject 10 Units into the skin at bedtime.    Historical Provider, MD  linagliptin (TRADJENTA) 5 MG TABS tablet Take 5 mg by mouth daily.    Historical Provider, MD  loratadine (CLARITIN) 10 MG tablet Take 10 mg by mouth daily as needed for allergies.    Historical Provider, MD  metoprolol tartrate (LOPRESSOR) 25 MG tablet Take 1 tablet (25 mg total) by mouth 2 (two) times daily. 05/03/15   Marykay Lexavid W Harding, MD  ondansetron (ZOFRAN ODT) 4 MG disintegrating tablet Take 1 tablet (4 mg total) by mouth every 8 (eight) hours as needed for nausea or vomiting. 03/08/15   Osvaldo ShipperGokul Krishnan, MD  oxyCODONE (OXY IR/ROXICODONE) 5 MG immediate release tablet Take 1-2 tablets (5-10 mg total) by mouth every 3 (three) hours as needed for severe pain. 12/27/14   Erin R Barrett, PA-C  simvastatin (ZOCOR) 10 MG tablet Take 10 mg by mouth at bedtime.    Historical Provider, MD  traMADol (ULTRAM) 50 MG tablet Take 1-2 tablets (50-100 mg total) by mouth every 4 (four) hours as needed for moderate pain. 12/27/14   Erin R Barrett, PA-C   Meds Ordered and Administered this Visit  Medications - No data to display  BP 137/88 mmHg  Pulse 83  Temp(Src) 98.4 F (36.9 C) (Oral)  Resp 18  SpO2 98% No data found.   Physical Exam  Constitutional: She appears well-developed. No distress.  HENT:  Right Ear: Tympanic membrane, external ear and ear canal normal.  Left Ear: Tympanic membrane, external ear and ear canal normal.  Nose: Mucosal edema and rhinorrhea present. No sinus tenderness or septal  deviation. Right sinus exhibits no maxillary sinus tenderness and no frontal sinus tenderness. Left sinus exhibits no maxillary sinus tenderness and no frontal sinus tenderness.  Mouth/Throat: Uvula is midline, oropharynx is clear and moist and mucous membranes are normal.  Eyes: Conjunctivae and EOM are normal. Right eye exhibits no discharge. Left eye exhibits no discharge.  Neck: Neck supple.  Cardiovascular: Normal rate, regular rhythm, normal heart sounds and intact distal pulses.   No murmur heard. Pulmonary/Chest: Effort normal and breath sounds normal. No respiratory distress. She has no wheezes.  Lymphadenopathy:    She has no cervical adenopathy.  Nursing note and vitals reviewed.   ED Course  Procedures (including critical care time)  Labs Review Labs Reviewed - No data to display  Imaging Review No  results found.   Visual Acuity Review  Right Eye Distance:   Left Eye Distance:   Bilateral Distance:    Right Eye Near:   Left Eye Near:    Bilateral Near:         MDM  No diagnosis found. Subacute sinusitis, unspecified location  Patient with worsening sinusitis for 5 wk. There is likely associated URI as well.  Will give Amoxicillin for her subacute sinusitis. May use OTC cough syrup as needed and tylenol prn pain. Return precaution discussed.    Doreene Eland, MD 11/02/15 [phone]

## 2015-11-04 ENCOUNTER — Other Ambulatory Visit: Payer: Self-pay | Admitting: Cardiology

## 2015-11-26 ENCOUNTER — Ambulatory Visit: Payer: Medicare Other | Admitting: Cardiology

## 2015-12-06 ENCOUNTER — Emergency Department (INDEPENDENT_AMBULATORY_CARE_PROVIDER_SITE_OTHER)
Admission: EM | Admit: 2015-12-06 | Discharge: 2015-12-06 | Disposition: A | Discharge status: Home / Self Care | Payer: Medicare Other | Source: Home / Self Care | Attending: Emergency Medicine | Admitting: Emergency Medicine

## 2015-12-06 ENCOUNTER — Encounter (HOSPITAL_COMMUNITY): Payer: Self-pay | Admitting: Emergency Medicine

## 2015-12-06 DIAGNOSIS — J40 Bronchitis, not specified as acute or chronic: Secondary | ICD-10-CM | POA: Diagnosis not present

## 2015-12-06 MED ORDER — PREDNISONE 50 MG PO TABS: ORAL_TABLET | ORAL | Status: DC

## 2015-12-06 MED ORDER — AZITHROMYCIN 250 MG PO TABS: ORAL_TABLET | ORAL | Status: DC

## 2015-12-06 NOTE — Discharge Instructions (Signed)
You have bronchitis. Take azithromycin and prednisone as prescribed. Use your albuterol every 4 hours as needed for wheezing or cough. Please continue your allergy pill and nasal spray. You should see improvement in the next 3-5 days. If you develop fevers, difficulty breathing, or are just not getting better, please come back or go to the emergency room.

## 2015-12-06 NOTE — ED Notes (Signed)
C/o cough and congestion for a week now Nasal drainage is green and cough is productive with thick white mucous Inhaler used as tx

## 2015-12-06 NOTE — ED Provider Notes (Signed)
CSN: [id number]     Arrival date & time 12/06/15  1301 History   First MD Initiated Contact with Patient 12/06/15 1311     Chief Complaint  Patient presents with  . Nasal Congestion  . Cough   (Consider location/radiation/quality/duration/timing/severity/associated sxs/prior Treatment) HPI He is a 64 year old woman here for evaluation of congestion. She states the last week she's had gradually worsening nasal congestion and chest congestion. She reports difficulty breathing out of her nose. She also describes a cough that is associated with mild shortness of breath at times. She has had some wheezing. She has been using her home Flonase and allergy pill without much improvement. She does use an albuterol inhaler and states that does help her symptoms. With a subjective fever 2 days ago, but no documented temperature. She has some nausea, but no vomiting.  Past Medical History  Diagnosis Date  . Diabetes mellitus type 2, insulin dependent (HCC)   . Essential hypertension   . CKD (chronic kidney disease), stage III   . Obesity     BMI ~35-36  . Hyperlipidemia with target LDL less than 70 01/28/2014  . NSTEMI (non-ST elevated myocardial infarction) (HCC) 12/18/2014    Echo 1/6: EF 60-65%, no Regional WMA, Gr 1 DD  . 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 Metabolic syndrome: DM, HTN, Obesity as well as HLD 12/18/2014  . Seasonal allergies   . Asthma   . Colitis    Past Surgical History  Procedure Laterality Date  . Abdominal hysterectomy    . Tonsillectomy    . Left heart catheterization with coronary angiogram N/A 12/18/2014    Procedure: LEFT HEART CATHETERIZATION WITH CORONARY ANGIOGRAM;  Surgeon: Peter M Swaziland, MD;  Location: [facility] CATH LAB;  Service: Cardiovascular;  Laterality: N/A;  . Coronary artery bypass graft N/A 12/21/2014    Procedure: CORONARY ARTERY BYPASS GRAFTING (CABG) TIMES TWO USING LEFT  INTERNAL MAMMARY AND RIGHT SAPHENOUS LEG VEIN HARVESTED ENDOSCOPICALLY;  Surgeon: Loreli Slot, MD;  Location: [facility] OR;  Service: Open Heart Surgery;  Laterality: N/A;  . Tee without cardioversion N/A 12/21/2014    Procedure: TRANSESOPHAGEAL ECHOCARDIOGRAM (TEE);  Surgeon: Loreli Slot, MD;  Location: [facility] OR;  Service: Open Heart Surgery;  Laterality: N/A;  . US echocardiography  04/09/2010    Possible small patent foramen ovale suspected;trace MR, mild TR.  [name] Kitchen Nm myocar perf wall motion  04/09/2010    Abnormal study - appears to be a small area of apical infarction. Reversible ischemia is not seen.   Family History  Problem Relation Age of Onset  . Pancreatic cancer Mother   . Stroke Father   . CAD Mother     Open heart surgery 60s-70s  . Heart disease Sister     Unclear details   Social History  Substance Use Topics  . Smoking status: Never Smoker   . Smokeless tobacco: Never Used  . Alcohol Use: No   OB History    Gravida Para Term Preterm AB TAB SAB Ectopic Multiple Living   Review of Systems As in history of present illness Allergies  Ace inhibitors; Lisinopril; Mushroom extract complex; and Tetanus toxoids  Home Medications   Prior to Admission medications   Medication Sig Start Date End Date Taking? Authorizing Provider  albuterol (PROVENTIL  HFA;VENTOLIN HFA) 108 (90 BASE) MCG/ACT inhaler Inhale 2 puffs into the lungs every 6 (six) hours as needed. For shortness of breath or wheezing    Historical Provider, MD  aspirin EC 325 MG EC tablet Take 1 tablet (325 mg total) by mouth daily. 12/27/14   Vickey Ewbank R Barrett, PA-C  azithromycin (ZITHROMAX Z-PAK) 250 MG tablet Take 2 pills today, then 1 pill daily until gone. 12/06/15   Charm RingsErin J Maranatha Grossi, MD  colchicine 0.6 MG tablet Take 0.6 mg by mouth 2 (two) times daily as needed (gout).     Historical Provider, MD  glipiZIDE (GLUCOTROL) 10 MG tablet Take 10 mg by mouth 2 (two) times daily before a meal.     Historical Provider, MD  hydrochlorothiazide (HYDRODIURIL) 25 MG tablet Take 25 mg by mouth daily.    Historical Provider, MD  insulin detemir (LEVEMIR) 100 UNIT/ML injection Inject 10 Units into the skin at bedtime.    Historical Provider, MD  linagliptin (TRADJENTA) 5 MG TABS tablet Take 5 mg by mouth daily.    Historical Provider, MD  loratadine (CLARITIN) 10 MG tablet Take 10 mg by mouth daily as needed for allergies.    Historical Provider, MD  metoprolol tartrate (LOPRESSOR) 25 MG tablet TAKE ONE TABLET BY MOUTH TWICE DAILY 11/05/15   Marykay Lexavid W Harding, MD  ondansetron (ZOFRAN ODT) 4 MG disintegrating tablet Take 1 tablet (4 mg total) by mouth every 8 (eight) hours as needed for nausea or vomiting. 03/08/15   Osvaldo ShipperGokul Krishnan, MD  oxyCODONE (OXY IR/ROXICODONE) 5 MG immediate release tablet Take 1-2 tablets (5-10 mg total) by mouth every 3 (three) hours as needed for severe pain. 12/27/14   Dereke Neumann R Barrett, PA-C  predniSONE (DELTASONE) 50 MG tablet Take 1 pill daily for 5 days. 12/06/15   Charm RingsErin J Bisma Klett, MD  simvastatin (ZOCOR) 10 MG tablet Take 10 mg by mouth at bedtime.    Historical Provider, MD  traMADol (ULTRAM) 50 MG tablet Take 1-2 tablets (50-100 mg total) by mouth every 4 (four) hours as needed for moderate pain. 12/27/14   Flara Storti R Barrett, PA-C   Meds Ordered and Administered this Visit  Medications - No data to display  BP 138/82 mmHg  Pulse 84  Temp(Src) 98.3 F (36.8 C) (Oral)  Resp 16  SpO2 99% No data found.   Physical Exam  Constitutional: She is oriented to person, place, and time. She appears well-developed and well-nourished. No distress.  HENT:  Mouth/Throat: No oropharyngeal exudate.  Nasal mucosa is edematous and erythematous. Mild postnasal drainage.  Neck: Neck supple.  Cardiovascular: Normal rate, regular rhythm and normal heart sounds.   No murmur heard. Pulmonary/Chest: Effort normal. No respiratory distress. She has no wheezes. She has no rales.  Diffusely  coarse breath sounds  Lymphadenopathy:    She has no cervical adenopathy.  Neurological: She is alert and oriented to person, place, and time.    ED Course  Procedures (including critical care time)  Labs Review Labs Reviewed - No data to display  Imaging Review No results found.   MDM   1. Bronchitis    Recommended continuing daily allergy pill and Flonase. We'll treat with azithromycin and prednisone. She will use her albuterol inhaler as needed. Return precautions reviewed.    Charm RingsErin J Kennesha Brewbaker, MD 12/06/15 [phone]

## 2015-12-11 ENCOUNTER — Encounter: Payer: Self-pay | Admitting: Cardiology

## 2015-12-11 ENCOUNTER — Ambulatory Visit (INDEPENDENT_AMBULATORY_CARE_PROVIDER_SITE_OTHER): Payer: Medicare Other | Admitting: Cardiology

## 2015-12-11 VITALS — BP 120/78 | HR 78 | Ht 62.0 in | Wt 200.0 lb

## 2015-12-11 DIAGNOSIS — E8881 Metabolic syndrome: Secondary | ICD-10-CM

## 2015-12-11 DIAGNOSIS — Z951 Presence of aortocoronary bypass graft: Secondary | ICD-10-CM | POA: Diagnosis not present

## 2015-12-11 DIAGNOSIS — E785 Hyperlipidemia, unspecified: Secondary | ICD-10-CM

## 2015-12-11 DIAGNOSIS — E669 Obesity, unspecified: Secondary | ICD-10-CM

## 2015-12-11 DIAGNOSIS — I10 Essential (primary) hypertension: Secondary | ICD-10-CM | POA: Diagnosis not present

## 2015-12-11 DIAGNOSIS — I249 Acute ischemic heart disease, unspecified: Secondary | ICD-10-CM | POA: Diagnosis not present

## 2015-12-11 DIAGNOSIS — I214 Non-ST elevation (NSTEMI) myocardial infarction: Secondary | ICD-10-CM

## 2015-12-11 DIAGNOSIS — I2510 Atherosclerotic heart disease of native coronary artery without angina pectoris: Secondary | ICD-10-CM | POA: Diagnosis not present

## 2015-12-11 NOTE — Progress Notes (Signed)
PCP: Dorrene GermanAVBUERE,[name], MD  Clinic Note: Chief Complaint  Patient presents with  . Coronary Artery Disease    s/p NSTEMI, CABG   HPI: Cheryl Gill is a 64 y.o. female with a PMH below who presents today for  following her CABG in January 2016. She has hypertension, type 2 diabetes, hypertension and CK D.-3.   January 2016: Admitted for  Non-STEMI  cardiac catheterization revealed severe disease in the proximal LAD that compromised the major diagonal branch. Based on the location of the stenosis and extensive disease --> CABG.   CABGx2: LIMA to LAD and SVG-D 1.   Well preserved EF by echo at the time of grade 1 diastolic function.     Dec 23rd - to Urgent Care for Bronchitis.  No more incisional CP. Goes to Gym 4-5 d/week - asking about doing machine weights; mostly does TM & Eliptical  No new studies.  Interval History:  Cheryl Gill presents today doing very well overall her cardiac standpoint.she is now more active than she was last saw her. She is going to the gym and doing routine exercise denies any resting or exertional angina. Her postop soreness as pretty much resolved.  She has been continuing her workouts 5 d @ gym/week --> TM, bike, eliptical.  with this activity she denies any exertional chest tightness/pressure or dyspnea.  Her primary physician is recently checked labs and told her that her A1c looked much better and labs look good.  She does have mild end of the day edema, but no PND, orthopnea or persistent edema. She denies rapid/irregular heartbeat/palpitations or syncope/near syncope, TIA /amaurosis fugax.  ROS: A comprehensive was performed. Review of Systems  Constitutional: Negative for malaise/fatigue.  HENT: Negative for nosebleeds.   Eyes: Negative for blurred vision.  Respiratory: Negative for cough, shortness of breath and wheezing.   Cardiovascular: Negative for claudication.  Gastrointestinal: Negative for heartburn, blood in stool and melena.    Genitourinary: Negative for hematuria.  Musculoskeletal: Positive for joint pain (Still has bilateral knee pain and gout pains.). Negative for falls.       Chest wall discomfort postop - has essentially gone.  Neurological: Negative for dizziness (NO longer notes the positional dizziness) and headaches.  Endo/Heme/Allergies: Negative.   Psychiatric/Behavioral: Negative.   All other systems reviewed and are negative.  Past Medical History  Diagnosis Date  . Diabetes mellitus type 2, insulin dependent (HCC)   . Essential hypertension   . CKD (chronic kidney disease), stage III   . Obesity     BMI ~35-36  . Hyperlipidemia with target LDL less than 70 01/28/2014  . NSTEMI (non-ST elevated myocardial infarction) (HCC) 12/18/2014    Echo 1/6: EF 60-65%, no Regional WMA, Gr 1 DD  . 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 Metabolic syndrome: DM, HTN, Obesity as well as HLD 12/18/2014  . Seasonal allergies   . Asthma   . Colitis     Past Surgical History  Procedure Laterality Date  . Abdominal hysterectomy    . Tonsillectomy    . Left heart catheterization with coronary angiogram N/A 12/18/2014    Procedure: LEFT HEART CATHETERIZATION WITH CORONARY ANGIOGRAM;  Surgeon: Peter M [name], MD;  [facility];  Service: Cardiovascular;  Laterality: N/A;  . Coronary artery bypass graft N/A 12/21/2014    Procedure: CORONARY ARTERY BYPASS GRAFTING (CABG) TIMES TWO  USING LEFT INTERNAL MAMMARY AND RIGHT SAPHENOUS LEG VEIN HARVESTED ENDOSCOPICALLY;  Surgeon: Loreli Slot, MD;  Location: [facility] OR;  Service: Open Heart Surgery;  Laterality: N/A;  . Tee without cardioversion N/A 12/21/2014    Procedure: TRANSESOPHAGEAL ECHOCARDIOGRAM (TEE);  Surgeon: Loreli Slot, MD;  Location: [facility] OR;  Service: Open Heart Surgery;  Laterality: N/A;  . US echocardiography  04/09/2010    Possible small patent foramen ovale  suspected;trace MR, mild TR.  [name] Kitchen Nm myocar perf wall motion  04/09/2010    Abnormal study - appears to be a small area of apical infarction. Reversible ischemia is not seen.    Current Outpatient Prescriptions on File Prior to Visit  Medication Sig Dispense Refill  . albuterol (PROVENTIL HFA;VENTOLIN HFA) 108 (90 BASE) MCG/ACT inhaler Inhale 2 puffs into the lungs every 6 (six) hours as needed. For shortness of breath or wheezing    . aspirin EC 325 MG EC tablet Take 1 tablet (325 mg total) by mouth daily. 30 tablet 0  . azithromycin (ZITHROMAX Z-PAK) 250 MG tablet Take 2 pills today, then 1 pill daily until gone. 6 tablet 0  . colchicine 0.6 MG tablet Take 0.6 mg by mouth 2 (two) times daily as needed (gout).     [name] Kitchen glipiZIDE (GLUCOTROL) 10 MG tablet Take 10 mg by mouth 2 (two) times daily before a meal.    . hydrochlorothiazide (HYDRODIURIL) 25 MG tablet Take 25 mg by mouth daily.    . insulin detemir (LEVEMIR) 100 UNIT/ML injection Inject 10 Units into the skin at bedtime.    [name] Kitchen linagliptin (TRADJENTA) 5 MG TABS tablet Take 5 mg by mouth daily.    [name] Kitchen loratadine (CLARITIN) 10 MG tablet Take 10 mg by mouth daily as needed for allergies.    . metoprolol tartrate (LOPRESSOR) 25 MG tablet TAKE ONE TABLET BY MOUTH TWICE DAILY 60 tablet 7  . ondansetron (ZOFRAN ODT) 4 MG disintegrating tablet Take 1 tablet (4 mg total) by mouth every 8 (eight) hours as needed for nausea or vomiting. 20 tablet 0  . oxyCODONE (OXY IR/ROXICODONE) 5 MG immediate release tablet Take 1-2 tablets (5-10 mg total) by mouth every 3 (three) hours as needed for severe pain. 30 tablet 0  . predniSONE (DELTASONE) 50 MG tablet Take 1 pill daily for 5 days. 5 tablet 0  . simvastatin (ZOCOR) 10 MG tablet Take 10 mg by mouth at bedtime.    . traMADol (ULTRAM) 50 MG tablet Take 1-2 tablets (50-100 mg total) by mouth every 4 (four) hours as needed for moderate pain. 30 tablet 0   No current facility-administered medications on file prior  to visit.   Allergies  Allergen Reactions  . Ace Inhibitors Anaphylaxis  . Lisinopril Anaphylaxis  . Mushroom Extract Complex Swelling  . Tetanus Toxoids Nausea And Vomiting and Swelling   Social History  Substance Use Topics  . Smoking status: Never Smoker   . Smokeless tobacco: Never Used  . Alcohol Use: No   Family History  Problem Relation Age of Onset  . Pancreatic cancer Mother   . Stroke Father   . CAD Mother     Open heart surgery 60s-70s  . Heart disease Sister     Unclear details    Wt Readings from Last 3 Encounters:  12/11/15 200 lb (90.719 kg)  05/30/15 196 lb 9.6 oz (89.177 kg)  03/04/15 186 lb (84.369 kg)   - increased 14 lb since March.  Only 4 from June (eating  more in [place]).  PHYSICAL EXAM BP 120/78 mmHg  Pulse 78  Ht  (1.575 m)  Wt 200 lb (90.719 kg)  BMI 36.57 kg/m2 General appearance: alert, cooperative, appears stated age, no distress and moderately obese HEENT: [place]/AT, EOMI, MMM, anicteric sclera Neck: no adenopathy, no carotid bruit and no JVD Lungs: clear to auscultation bilaterally, normal percussion bilaterally and non-labored Heart: regular rate and rhythm, S1 & S2 normal, no murmur, click, rub or gallop; non-displaced PMI; well healed sternotomy scar  Abdomen: soft, non-tender; bowel sounds normal; no masses,  no organomegaly; No HJR Extremities: extremities normal, atraumatic, no cyanosis; minimal pedalr edema. Pulses: 2+ and symmetric; Skin: normal and mobility and turgor normal  Neurologic: Mental status: Alert, oriented, thought content appropriate; Cranial nerves: normal (II-XII grossly intact)   Adult ECG Report  Rate: 93 ;  Rhythm: normal sinus rhythm and Borderline low voltage. Lateral T wave inversions  Have normalized to almost T-wave flattening.   Narrative Interpretation: stable EKG. 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 E her again the importance of dietary adjustment it was discussed in cardiac rehabilitation, along with her routine exercise.      NSTEMI (non-ST elevated myocardial infarction) (HCC)   Relevant Orders   EKG 12-Lead (Completed)   Metabolic syndrome: DM, HTN, Obesity as well as HLD (Chronic)    Unfortunately, she still is gaining weight since her bypass surgery. Despite her routine activity, she needs to modify her diet. Now on insulin as well as statin. Blood pressure is well controlled.      Hyperlipidemia with target LDL less than 70 (Chronic)    She was able by her January lipids. Continue current dose of statin. Minor staining with her PCP was checking her lipids, have not seen recent studies. She indicated that they were just checked recently.      Essential hypertension - Primary (Chronic)    Well controlled on HCTZ and beta blocker. Has allergy/anaphylaxis with ACE inhibitors - for now, with normal blood pressures, I would not add ARB.      Relevant Orders   EKG 12-Lead (Completed)   Coronary artery disease involving native coronary artery of native heart without angina pectoris (Chronic)    No more active anginal symptoms since her 2-V CABG. He is on a walker, aspirin and statin. Continue stable medications.        Current medicines are reviewed at length with the patient today.  No new medications  The following changes have been made:   Okay to complete exercise - but would still avoid heavy lifting.    Labs/ tests ordered today include:  Orders Placed  This Encounter  Procedures  . EKG 12-Lead   No orders of the defined types were placed in this encounter.     Followup: 6 months   HARDING, Piedad Climes, M.D., M.S. Interventional Cardiologist   Pager #  [phone]      

## 2015-12-11 NOTE — Patient Instructions (Signed)
No change with current medications  Your physician wants you to follow-up in 6 months Dr Herbie BaltimoreHARDING.  You will receive a reminder letter in the mail two months in advance. If you don't receive a letter, please call our office to schedule the follow-up appointment.   If you need a refill on your cardiac medications before your next appointment, please call your pharmacy.

## 2015-12-13 ENCOUNTER — Encounter: Payer: Self-pay | Admitting: Cardiology

## 2015-12-13 NOTE — Assessment & Plan Note (Signed)
Unfortunately she is tender in the wrong direction. E her again the importance of dietary adjustment it was discussed in cardiac rehabilitation, along with her routine exercise.

## 2015-12-13 NOTE — Assessment & Plan Note (Signed)
No more active anginal symptoms since her 2-V CABG. He is on a walker, aspirin and statin. Continue stable medications.

## 2015-12-13 NOTE — Assessment & Plan Note (Signed)
Well controlled on HCTZ and beta blocker. Has allergy/anaphylaxis with ACE inhibitors - for now, with normal blood pressures, I would not add ARB.

## 2015-12-13 NOTE — Assessment & Plan Note (Signed)
She was able by her January lipids. Continue current dose of statin. Minor staining with her PCP was checking her lipids, have not seen recent studies. She indicated that they were just checked recently.

## 2015-12-13 NOTE — Assessment & Plan Note (Signed)
Unfortunately, she still is gaining weight since her bypass surgery. Despite her routine activity, she needs to modify her diet. Now on insulin as well as statin. Blood pressure is well controlled.

## 2016-01-02 IMAGING — CT CT ABD-PELV W/ CM
2 of 5 series · 17 of 46 positions shown, 19 images · IV contrast (CONTRAST)
Comparison: CT Abdomen and Pelvis 01/16/2011.

CLINICAL DATA: 62-year-old female epigastric pain nausea vomiting.
Initial encounter.

EXAM:
CT ABDOMEN AND PELVIS WITH CONTRAST
TECHNIQUE: Multidetector CT imaging of the abdomen and pelvis was performed
using the standard protocol following bolus administration of
intravenous contrast.
CONTRAST:  100mL OMNIPAQUE IOHEXOL 300 MG/ML  SOLN

[Series 2: routine · axial · 0.89mm/px · z∈[-7,+373]mm · 14 of 86 slices shown, 16 images]
[im 5/86  soft-tissue]
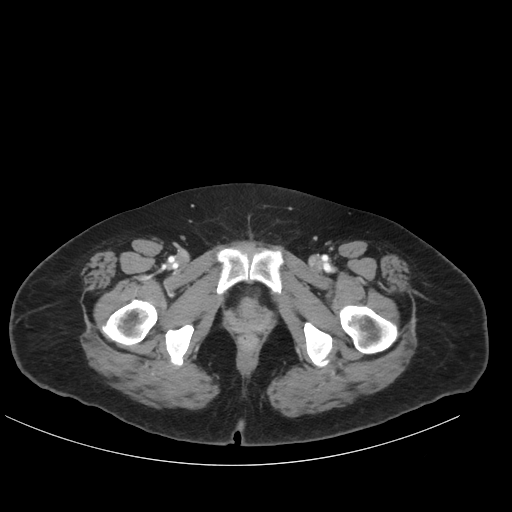
[im 5/86  bone]
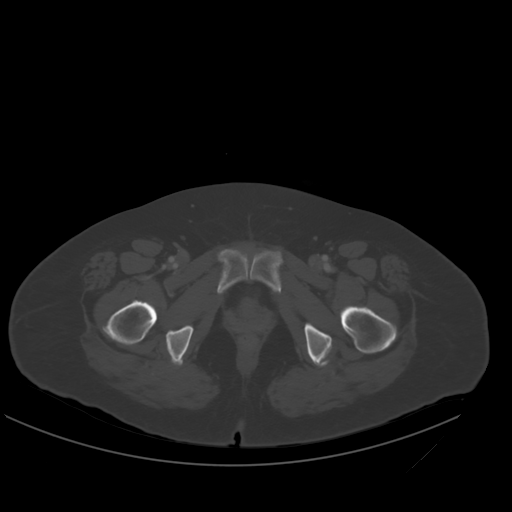
[im 10/86  soft-tissue]
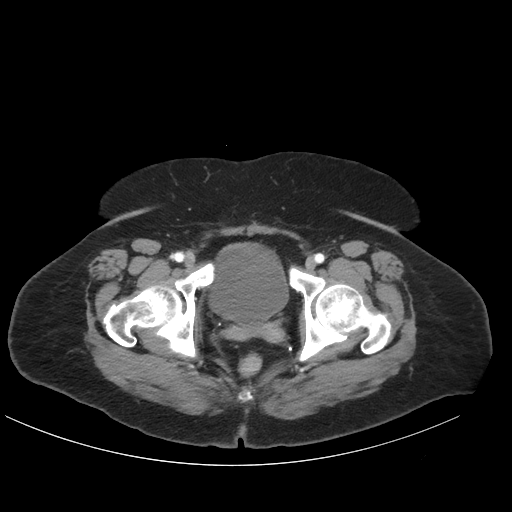
[im 19/86  soft-tissue]
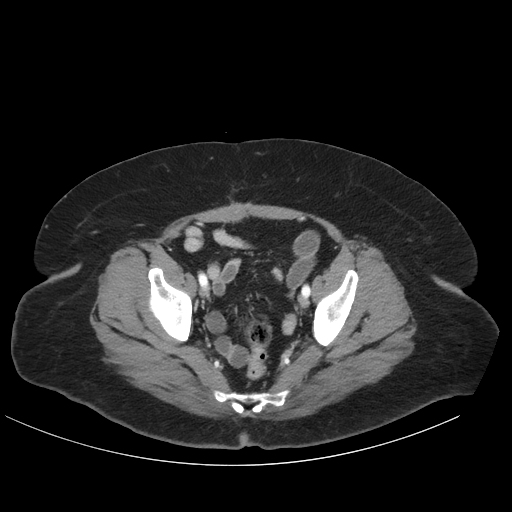
[im 24/86  soft-tissue]
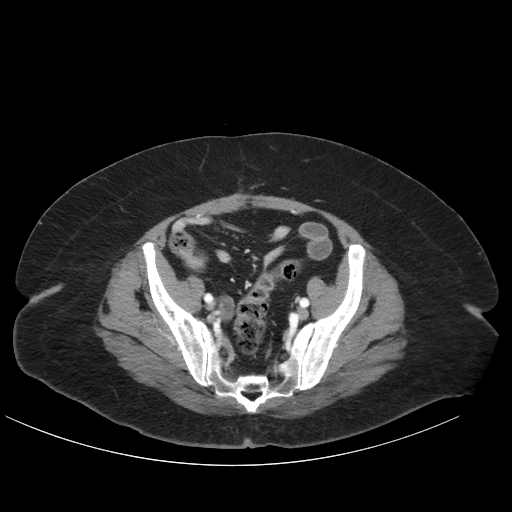
[im 29/86  soft-tissue]
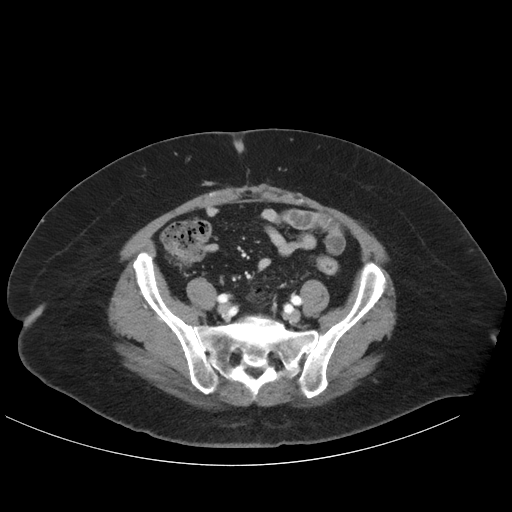
[im 34/86  soft-tissue]
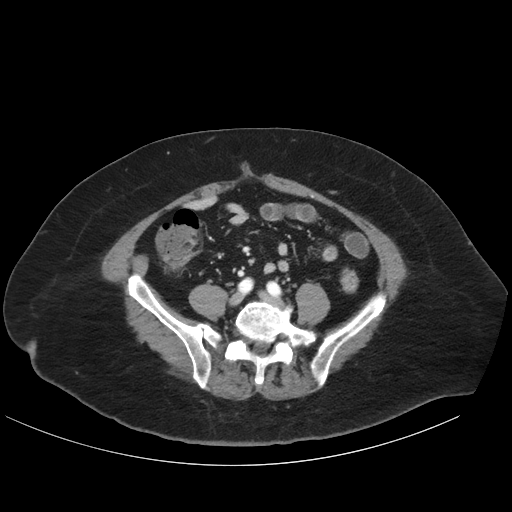
[im 38/86  soft-tissue]
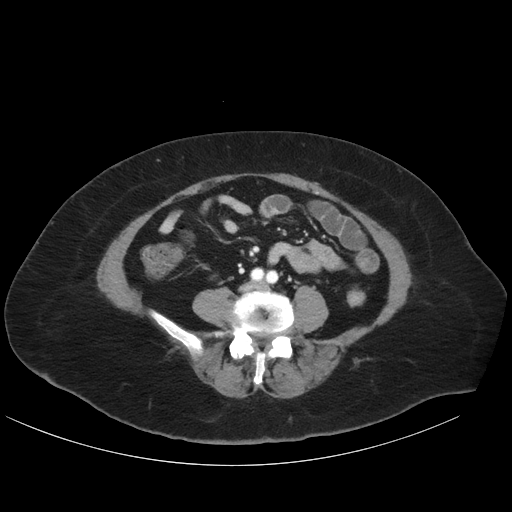
[im 48/86  soft-tissue]
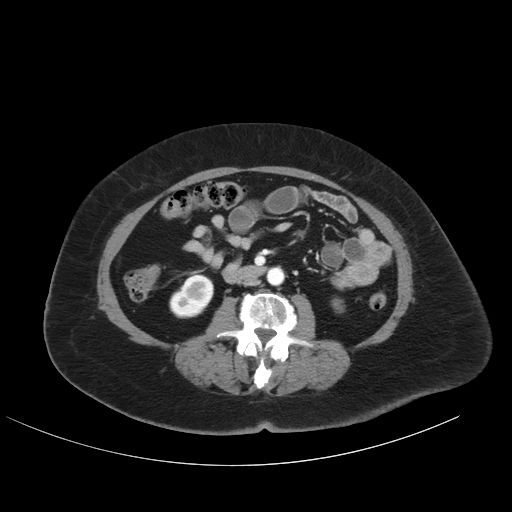
[im 52/86  soft-tissue]
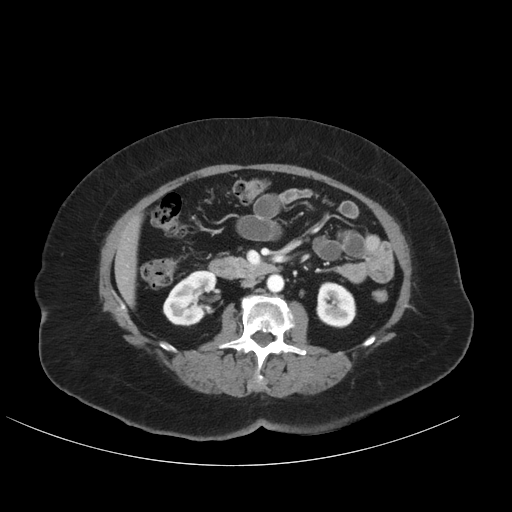
[im 52/86  bone]
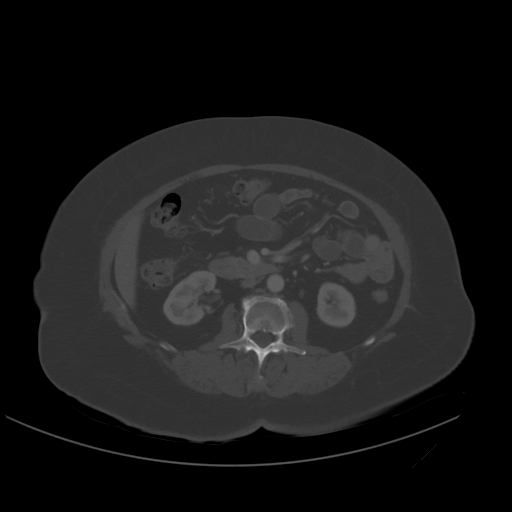
[im 57/86  soft-tissue]
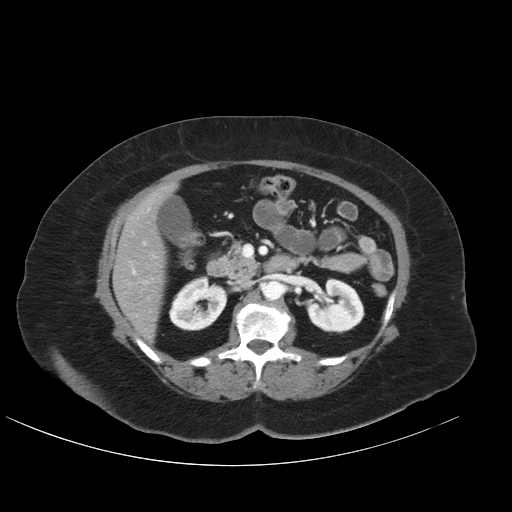
[im 62/86  soft-tissue]
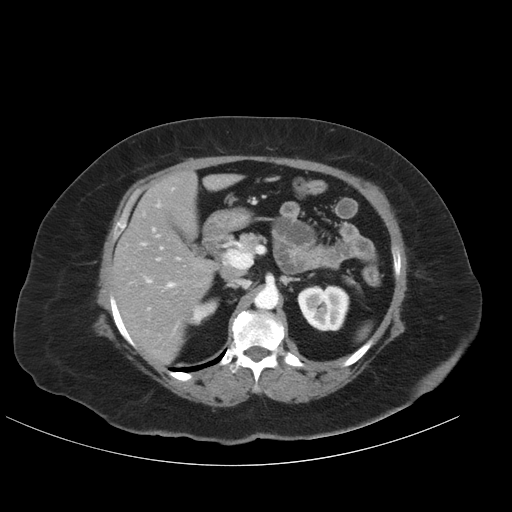
[im 67/86  soft-tissue]
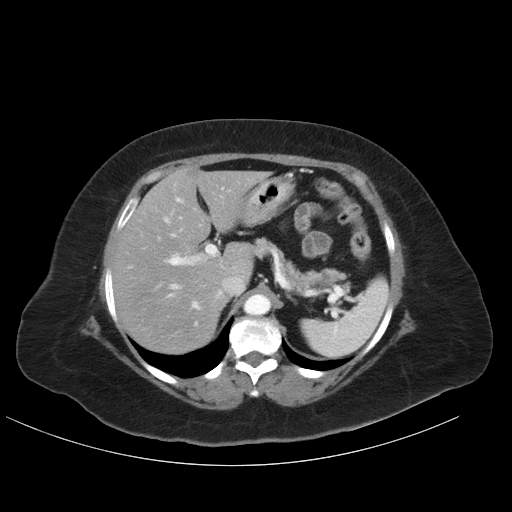
[im 76/86  soft-tissue]
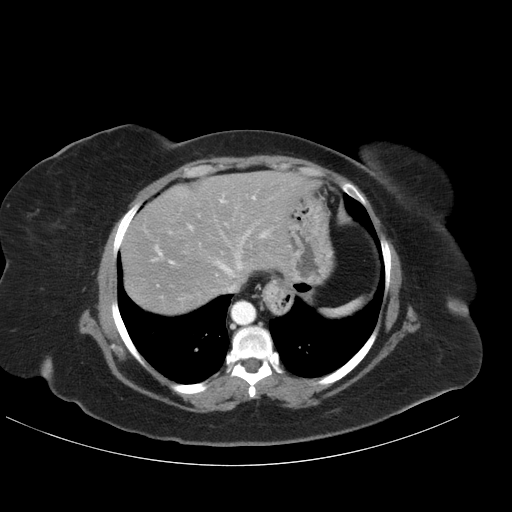
[im 81/86  soft-tissue]
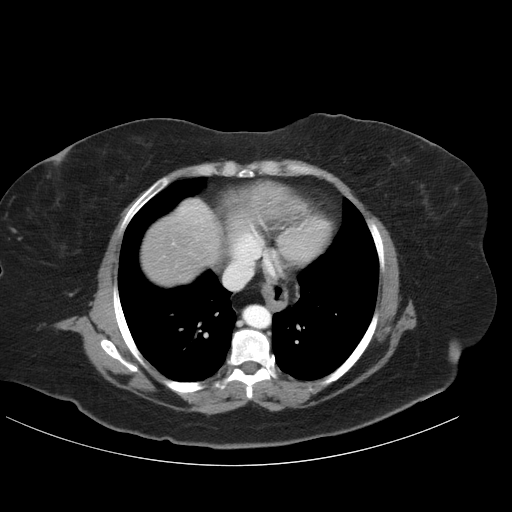

[mpr, coronals, coronal · coronal · 0.89mm/px · 3 of 91 slices shown]
[im 31/91  soft-tissue]
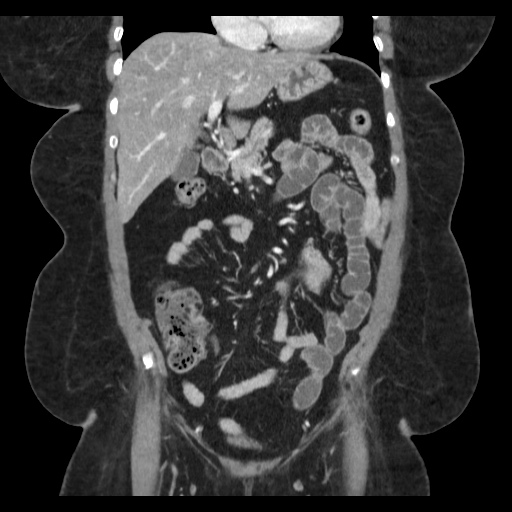
[im 41/91  soft-tissue]
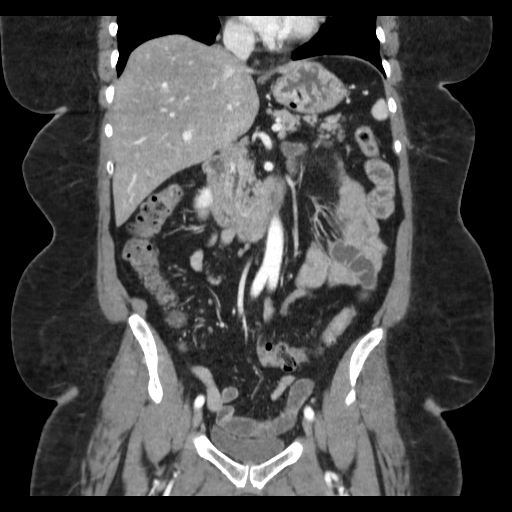
[im 51/91  soft-tissue]
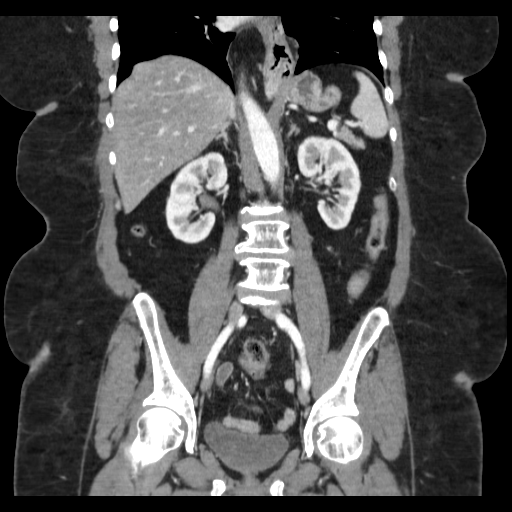

[17 of 46 positions shown; findings below may reference images not displayed]

FINDINGS: No pericardial or pleural effusion.  Minor lung base atelectasis.

No acute osseous abnormality identified. Degenerative changes in the
lumbar spine.

No pelvic free fluid. Negative bladder. Decompressed rectum. Uterus
and adnexa appear surgically absent.

Negative sigmoid colon except for occasional diverticula. Negative
left colon. Small calcification probably of an epiploic appendage,
unchanged. Negative transverse colon. Negative right colon. Appendix
not identified, favor surgically absent. No dilated small bowel.
Negative terminal ileum. Small hiatal hernia. Largely decompressed
stomach. Negative duodenum.

Further decreased density throughout the liver. Negative
gallbladder, spleen, pancreas, adrenal glands, portal venous system,
and major arterial structures in the abdomen and pelvis. Kidneys
appear stable and within normal limits. No abdominal free fluid. No
lymphadenopathy. Large body habitus. No focal inflammatory
stranding.
IMPRESSION: No acute or inflammatory process identified in the abdomen or
pelvis. Progressed hepatic steatosis.

## 2016-01-02 IMAGING — CR DG ABDOMEN ACUTE W/ 1V CHEST
4 series · 4 of 4 positions shown · non-contrast
Comparison: CT Abdomen and Pelvis to [DATE] and earlier.

CLINICAL DATA: 62-year-old female abdominal pain nausea vomiting.
Initial encounter.

EXAM:
ACUTE ABDOMEN SERIES (ABDOMEN 2 VIEW & CHEST 1 VIEW)

[w chest pa]
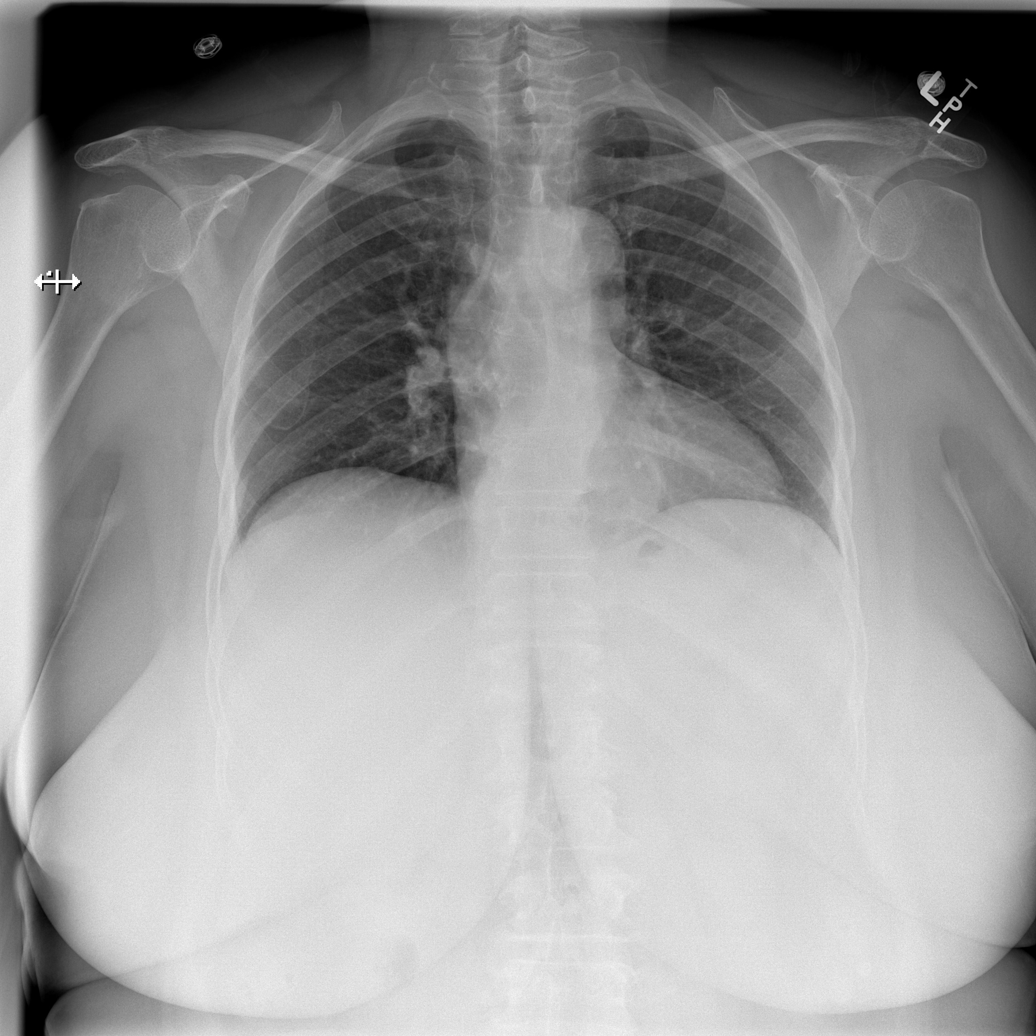

[w abdomen upright (1 of 2)]
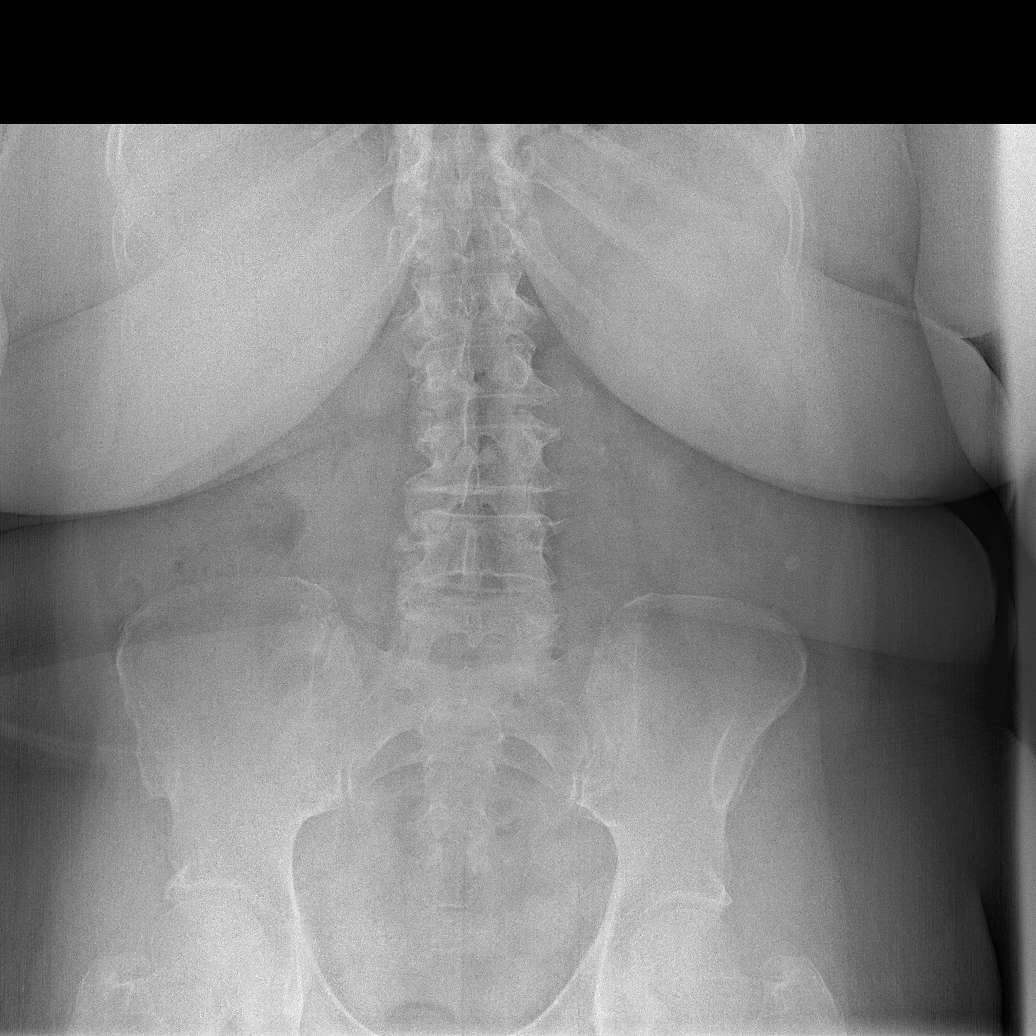

[w abdomen upright (2 of 2)]
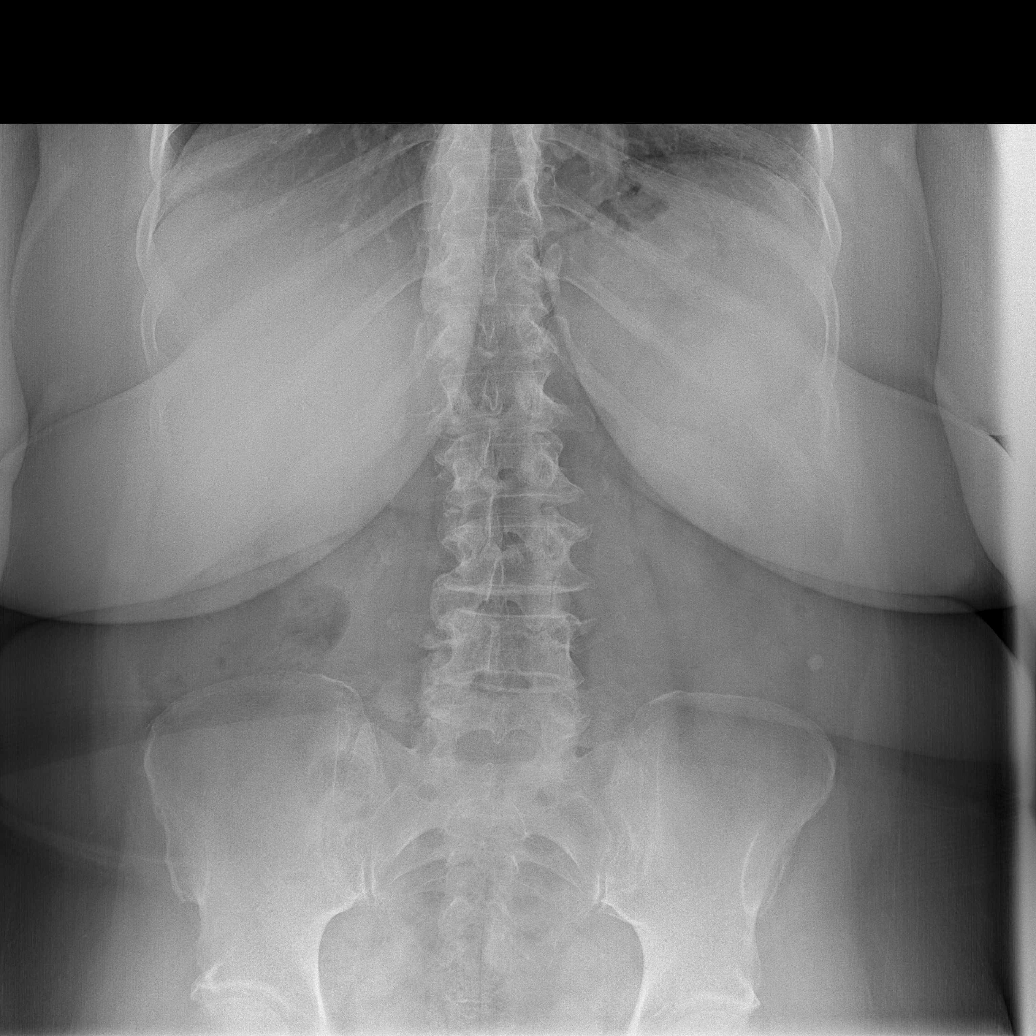

[t abdomen supine]
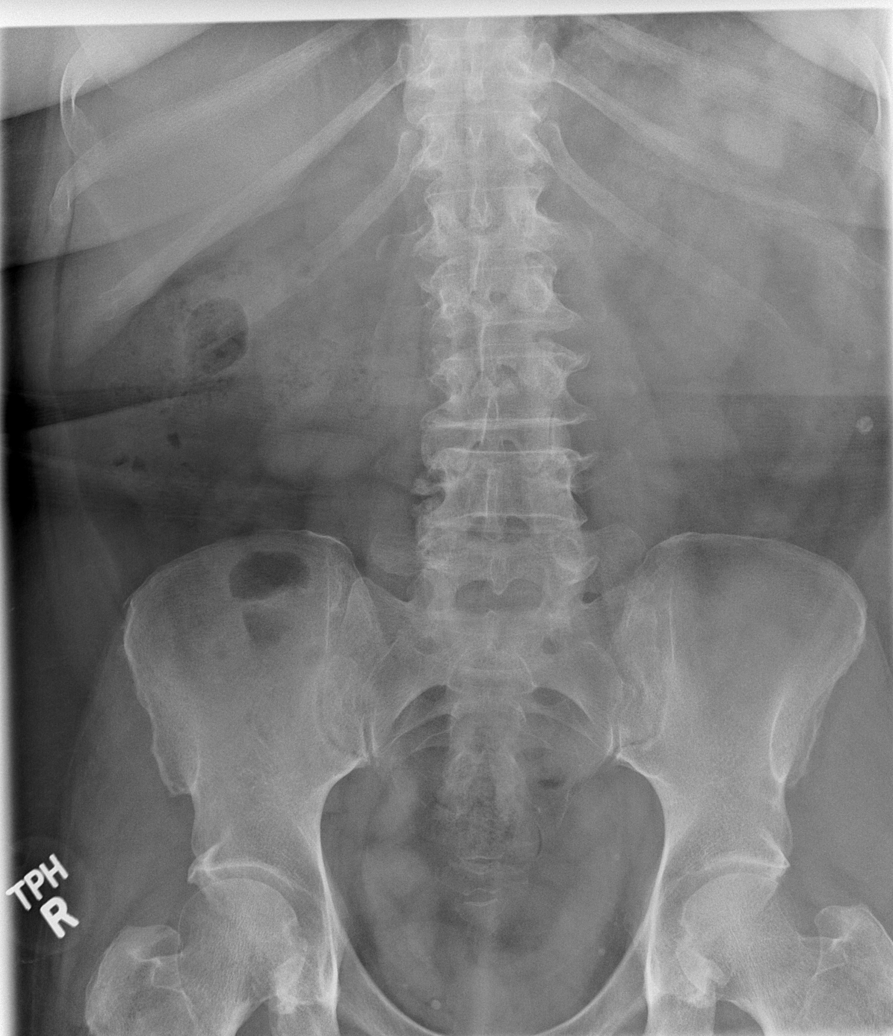

[4 of 4 positions shown; findings below may reference images not displayed]

FINDINGS: Low lung volumes. Normal cardiac size and mediastinal contours. No
pneumothorax or pneumoperitoneum. No confluent pulmonary opacity.
Suggestion of a small hiatal hernia.

Non obstructed bowel gas pattern. Small calcified epiploic appendage
is unchanged and inconsequential. Negative abdominal and pelvic
visceral contours. Pelvic phleboliths. No acute osseous abnormality
identified.
IMPRESSION: Non obstructed bowel gas pattern, no free air.  Low lung volumes.

## 2016-01-08 ENCOUNTER — Emergency Department (INDEPENDENT_AMBULATORY_CARE_PROVIDER_SITE_OTHER): Payer: Medicare Other

## 2016-01-08 ENCOUNTER — Encounter (HOSPITAL_COMMUNITY): Payer: Self-pay | Admitting: Emergency Medicine

## 2016-01-08 ENCOUNTER — Emergency Department (INDEPENDENT_AMBULATORY_CARE_PROVIDER_SITE_OTHER)
Admission: EM | Admit: 2016-01-08 | Discharge: 2016-01-08 | Disposition: A | Discharge status: Home / Self Care | Payer: Medicare Other | Source: Home / Self Care | Attending: Emergency Medicine | Admitting: Emergency Medicine

## 2016-01-08 DIAGNOSIS — J45909 Unspecified asthma, uncomplicated: Secondary | ICD-10-CM

## 2016-01-08 DIAGNOSIS — J40 Bronchitis, not specified as acute or chronic: Secondary | ICD-10-CM

## 2016-01-08 MED ORDER — AZITHROMYCIN 250 MG PO TABS: ORAL_TABLET | ORAL | Status: DC

## 2016-01-08 MED ORDER — IPRATROPIUM-ALBUTEROL 0.5-2.5 (3) MG/3ML IN SOLN
3.0000 mL | Freq: Once | RESPIRATORY_TRACT | Status: AC
  Administered 2016-01-08: 3 mL via RESPIRATORY_TRACT

## 2016-01-08 MED ORDER — BENZONATATE 200 MG PO CAPS: 200.0000 mg | ORAL_CAPSULE | Freq: Two times a day (BID) | ORAL | Status: DC | PRN

## 2016-01-08 MED ORDER — IPRATROPIUM-ALBUTEROL 0.5-2.5 (3) MG/3ML IN SOLN
RESPIRATORY_TRACT | Status: AC
  Filled 2016-01-08: qty 3

## 2016-01-08 MED ORDER — PREDNISONE 50 MG PO TABS: ORAL_TABLET | ORAL | Status: DC

## 2016-01-08 NOTE — ED Notes (Signed)
Patient reports "rattling in chest"  Coughing and sneezing onset 1/22.  Sore throat and congested and wheezing in chest per patient

## 2016-01-08 NOTE — ED Notes (Signed)
Patient transported to X-ray 

## 2016-01-08 NOTE — Discharge Instructions (Signed)
You have bronchitis. °Take azithromycin and prednisone as prescribed. °Use tessalon as needed for cough. °Use the albuterol every 4 hours as needed for wheezing or cough. °You should see improvement in the next 3-5 days. °If you develop fevers, difficulty breathing, or are just not getting better, please come back or go to the emergency room. ° °

## 2016-01-08 NOTE — ED Provider Notes (Signed)
CSN: [id number]     Arrival date & time 01/08/16  1309 History   First MD Initiated Contact with Patient 01/08/16 1428     Chief Complaint  Patient presents with  . URI   (Consider location/radiation/quality/duration/timing/severity/associated sxs/prior Treatment) HPI  She is a 65 year old woman here for evaluation of cough. She reports a 3-4 day history of nasal congestion, rhinorrhea, cough, and wheezing. She does report some shortness of breath. No fevers or chills. No nausea or vomiting. She reports a decreased appetite.  She does have a history of asthma for which she takes Advair and albuterol as needed. She used her albuterol earlier today with some improvement.  Past Medical History  Diagnosis Date  . Diabetes mellitus type 2, insulin dependent (HCC)   . Essential hypertension   . CKD (chronic kidney disease), stage III   . Obesity     BMI ~35-36  . Hyperlipidemia with target LDL less than 70 01/28/2014  . NSTEMI (non-ST elevated myocardial infarction) (HCC) 12/18/2014    Echo 1/6: EF 60-65%, no Regional WMA, Gr 1 DD  . 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 Metabolic syndrome: DM, HTN, Obesity as well as HLD 12/18/2014  . Seasonal allergies   . Asthma   . Colitis    Past Surgical History  Procedure Laterality Date  . Abdominal hysterectomy    . Tonsillectomy    . Left heart catheterization with coronary angiogram N/A 12/18/2014    Procedure: LEFT HEART CATHETERIZATION WITH CORONARY ANGIOGRAM;  Surgeon: Peter M Swaziland, MD;  Location: [facility] CATH LAB;  Service: Cardiovascular;  Laterality: N/A;  . Coronary artery bypass graft N/A 12/21/2014    Procedure: CORONARY ARTERY BYPASS GRAFTING (CABG) TIMES TWO USING LEFT INTERNAL MAMMARY AND RIGHT SAPHENOUS LEG VEIN HARVESTED ENDOSCOPICALLY;  Surgeon: Loreli Slot, MD;  Location: [facility] OR;  Service: Open Heart Surgery;  Laterality: N/A;  . Tee without  cardioversion N/A 12/21/2014    Procedure: TRANSESOPHAGEAL ECHOCARDIOGRAM (TEE);  Surgeon: Loreli Slot, MD;  Location: [facility] OR;  Service: Open Heart Surgery;  Laterality: N/A;  . US echocardiography  04/09/2010    Possible small patent foramen ovale suspected;trace MR, mild TR.  [name] Kitchen Nm myocar perf wall motion  04/09/2010    Abnormal study - appears to be a small area of apical infarction. Reversible ischemia is not seen.   Family History  Problem Relation Age of Onset  . Pancreatic cancer Mother   . Stroke Father   . CAD Mother     Open heart surgery 60s-70s  . Heart disease Sister     Unclear details   Social History  Substance Use Topics  . Smoking status: Never Smoker   . Smokeless tobacco: Never Used  . Alcohol Use: No   OB History    Gravida Para Term Preterm AB TAB SAB Ectopic Multiple Living   Review of Systems As in history of present illness Allergies  Ace inhibitors; Lisinopril; Mushroom extract complex; and Tetanus toxoids  Home Medications   Prior to Admission medications   Medication Sig Start Date End Date Taking? Authorizing Provider  albuterol (PROVENTIL HFA;VENTOLIN HFA) 108 (90 BASE) MCG/ACT inhaler Inhale 2 puffs into the lungs every 6 (six) hours as needed. For shortness of breath or wheezing    Historical Provider,  MD  aspirin EC 325 MG EC tablet Take 1 tablet (325 mg total) by mouth daily. 12/27/14   Marte Celani R Barrett, PA-C  azithromycin (ZITHROMAX Z-PAK) 250 MG tablet Take 2 pills today, then 1 pill daily until gone. 01/08/16   Charm Rings, MD  benzonatate (TESSALON) 200 MG capsule Take 1 capsule (200 mg total) by mouth 2 (two) times daily as needed for cough. 01/08/16   Charm Rings, MD  colchicine 0.6 MG tablet Take 0.6 mg by mouth 2 (two) times daily as needed (gout).     Historical Provider, MD  glipiZIDE (GLUCOTROL) 10 MG tablet Take 10 mg by mouth 2 (two) times daily before a meal.    Historical Provider, MD  hydrochlorothiazide  (HYDRODIURIL) 25 MG tablet Take 25 mg by mouth daily.    Historical Provider, MD  insulin detemir (LEVEMIR) 100 UNIT/ML injection Inject 10 Units into the skin at bedtime.    Historical Provider, MD  linagliptin (TRADJENTA) 5 MG TABS tablet Take 5 mg by mouth daily.    Historical Provider, MD  loratadine (CLARITIN) 10 MG tablet Take 10 mg by mouth daily as needed for allergies.    Historical Provider, MD  metoprolol tartrate (LOPRESSOR) 25 MG tablet TAKE ONE TABLET BY MOUTH TWICE DAILY 11/05/15   Marykay Lex, MD  ondansetron (ZOFRAN ODT) 4 MG disintegrating tablet Take 1 tablet (4 mg total) by mouth every 8 (eight) hours as needed for nausea or vomiting. 03/08/15   Osvaldo Shipper, MD  oxyCODONE (OXY IR/ROXICODONE) 5 MG immediate release tablet Take 1-2 tablets (5-10 mg total) by mouth every 3 (three) hours as needed for severe pain. 12/27/14   Meshilem Machuca R Barrett, PA-C  predniSONE (DELTASONE) 50 MG tablet Take 1 pill daily for 5 days. 01/08/16   Charm Rings, MD  simvastatin (ZOCOR) 10 MG tablet Take 10 mg by mouth at bedtime.    Historical Provider, MD  traMADol (ULTRAM) 50 MG tablet Take 1-2 tablets (50-100 mg total) by mouth every 4 (four) hours as needed for moderate pain. 12/27/14   Tonantzin Mimnaugh R Barrett, PA-C   Meds Ordered and Administered this Visit   Medications  ipratropium-albuterol (DUONEB) 0.5-2.5 (3) MG/3ML nebulizer solution 3 mL (3 mLs Nebulization Given 01/08/16 1455)    BP 133/90 mmHg  Pulse 88  Temp(Src) 98.9 F (37.2 C) (Oral)  Resp 16  SpO2 100% No data found.   Physical Exam  Constitutional: She is oriented to person, place, and time. She appears well-developed and well-nourished. No distress.  HENT:  Mouth/Throat: No oropharyngeal exudate.  Nasal mucosa is erythematous and boggy. She has mild oropharyngeal erythema.  Neck: Neck supple.  Cardiovascular: Normal rate, regular rhythm and normal heart sounds.   No murmur heard. Pulmonary/Chest: Effort normal. No respiratory  distress. She has wheezes (bilateral expiratory). She has no rales.  Neurological: She is alert and oriented to person, place, and time.    ED Course  Procedures (including critical care time)  Labs Review Labs Reviewed - No data to display  Imaging Review Dg Chest 2 View  01/08/2016  CLINICAL DATA:  Cough. Wheezing. Shortness of breath. Sore throat. Duration: 4 days. EXAM: CHEST  2 VIEW COMPARISON:  Multiple exams, including 03/05/2015 FINDINGS: Prior CABG. Mild enlargement of the cardiopericardial silhouette, without edema. No pleural effusion. The lungs appear otherwise clear. IMPRESSION: 1. The lungs appear clear. 2. Mild enlargement of the cardiopericardial silhouette, without edema. Prior CABG. Electronically Signed   By: Annitta Needs.D.  On: 01/08/2016 15:31        MDM   1. Bronchitis    Wheezing improved after DuoNeb, but persistent in the left lung base. Chest x-ray is negative. We'll treat with prednisone and azithromycin. She will use her albuterol as needed. Prescription given for Tessalon to use for the cough. Follow-up as needed.    Charm Rings, MD 01/08/16 [phone]

## 2016-03-31 DIAGNOSIS — N183 Chronic kidney disease, stage 3 (moderate): Secondary | ICD-10-CM | POA: Diagnosis not present

## 2016-03-31 DIAGNOSIS — E669 Obesity, unspecified: Secondary | ICD-10-CM | POA: Diagnosis not present

## 2016-03-31 DIAGNOSIS — E1321 Other specified diabetes mellitus with diabetic nephropathy: Secondary | ICD-10-CM | POA: Diagnosis not present

## 2016-03-31 DIAGNOSIS — M1A9XX0 Chronic gout, unspecified, without tophus (tophi): Secondary | ICD-10-CM | POA: Diagnosis not present

## 2016-03-31 DIAGNOSIS — E784 Other hyperlipidemia: Secondary | ICD-10-CM | POA: Diagnosis not present

## 2016-03-31 DIAGNOSIS — J4520 Mild intermittent asthma, uncomplicated: Secondary | ICD-10-CM | POA: Diagnosis not present

## 2016-05-12 DIAGNOSIS — M12851 Other specific arthropathies, not elsewhere classified, right hip: Secondary | ICD-10-CM | POA: Diagnosis not present

## 2016-05-12 DIAGNOSIS — M1A9XX0 Chronic gout, unspecified, without tophus (tophi): Secondary | ICD-10-CM | POA: Diagnosis not present

## 2016-05-12 DIAGNOSIS — J4520 Mild intermittent asthma, uncomplicated: Secondary | ICD-10-CM | POA: Diagnosis not present

## 2016-05-12 DIAGNOSIS — E119 Type 2 diabetes mellitus without complications: Secondary | ICD-10-CM | POA: Diagnosis not present

## 2016-05-12 DIAGNOSIS — I10 Essential (primary) hypertension: Secondary | ICD-10-CM | POA: Diagnosis not present

## 2016-05-20 ENCOUNTER — Other Ambulatory Visit: Payer: Self-pay | Admitting: *Deleted

## 2016-05-20 MED ORDER — METOPROLOL TARTRATE 25 MG PO TABS: 25.0000 mg | ORAL_TABLET | Freq: Two times a day (BID) | ORAL | Status: DC

## 2016-06-23 DIAGNOSIS — E1321 Other specified diabetes mellitus with diabetic nephropathy: Secondary | ICD-10-CM | POA: Diagnosis not present

## 2016-06-23 DIAGNOSIS — I2510 Atherosclerotic heart disease of native coronary artery without angina pectoris: Secondary | ICD-10-CM | POA: Diagnosis not present

## 2016-06-23 DIAGNOSIS — I10 Essential (primary) hypertension: Secondary | ICD-10-CM | POA: Diagnosis not present

## 2016-06-23 DIAGNOSIS — J4520 Mild intermittent asthma, uncomplicated: Secondary | ICD-10-CM | POA: Diagnosis not present

## 2016-06-23 DIAGNOSIS — E784 Other hyperlipidemia: Secondary | ICD-10-CM | POA: Diagnosis not present

## 2016-06-23 DIAGNOSIS — M1A9XX0 Chronic gout, unspecified, without tophus (tophi): Secondary | ICD-10-CM | POA: Diagnosis not present

## 2016-07-02 ENCOUNTER — Ambulatory Visit (INDEPENDENT_AMBULATORY_CARE_PROVIDER_SITE_OTHER): Payer: Medicare HMO | Admitting: Cardiology

## 2016-07-02 ENCOUNTER — Encounter: Payer: Self-pay | Admitting: Cardiology

## 2016-07-02 VITALS — BP 112/78 | HR 74 | Ht 62.0 in | Wt 199.8 lb

## 2016-07-02 DIAGNOSIS — I10 Essential (primary) hypertension: Secondary | ICD-10-CM | POA: Diagnosis not present

## 2016-07-02 DIAGNOSIS — I2510 Atherosclerotic heart disease of native coronary artery without angina pectoris: Secondary | ICD-10-CM | POA: Diagnosis not present

## 2016-07-02 DIAGNOSIS — E785 Hyperlipidemia, unspecified: Secondary | ICD-10-CM

## 2016-07-02 DIAGNOSIS — E8881 Metabolic syndrome: Secondary | ICD-10-CM

## 2016-07-02 DIAGNOSIS — I214 Non-ST elevation (NSTEMI) myocardial infarction: Secondary | ICD-10-CM | POA: Diagnosis not present

## 2016-07-02 DIAGNOSIS — E669 Obesity, unspecified: Secondary | ICD-10-CM

## 2016-07-02 DIAGNOSIS — J302 Other seasonal allergic rhinitis: Secondary | ICD-10-CM

## 2016-07-02 NOTE — Progress Notes (Signed)
PCP: Dorrene GermanEdwin A [name], MD  Clinic Note: Chief Complaint  Patient presents with  . Follow-up    6 months  . Coronary Artery Disease    History of CABG   HPI: Cheryl Gill is a 65 y.o. female with a PMH below who presents today for  Delayed six-month follow-up for CABG in January 2016. She has hypertension, type 2 diabetes, hypertension and CK D.-3.   January 2016: Admitted for  Non-STEMI  cardiac catheterization revealed severe disease in the proximal LAD that compromised the major diagonal branch. Based on the location of the stenosis and extensive disease --> CABG.   CABGx2: LIMA to LAD and SVG-D 1.   Well preserved EF by echo at the time of grade 1 diastolic function.     She was last seen December 2016.  Over last few months less gym b/c gout & social issues.  Still does TM @ home.   No new studies.No new hospitalizations.  Interval History:  Cheryl Gill presents today Again doing quite well from a cardiac standpoint. Unfortunately she over last few months she has not been in the go to the gym and workout as much as she had been doing because of gout related pain. She is still trying to treadmill at home, but has been limited because of pain. She denies any chest tightness or pressure symptoms with rest or exertion. Despite not getting exercise, she has still been trying to monitor her diet and exercise when she can and is happy about having lost some weight. She may still note a little bit mild edema but goes down when she puts her feet up. Otherwise no PND, orthopnea. She denies rapid/irregular heartbeat/palpitations or syncope/near syncope, TIA /amaurosis fugax.  ROS: A comprehensive was performed. Review of Systems  Constitutional: Positive for weight loss (Has been trying to lose weight with diet and exercise. Unfortunately not limited by symptoms.). Negative for malaise/fatigue.  HENT: Positive for congestion ( seasonal allergies ). Negative for nosebleeds.   Eyes:  Negative for blurred vision.  Respiratory: Negative for cough, shortness of breath and wheezing.   Cardiovascular: Positive for palpitations (raley occur. Only if she eats chocolate). Negative for claudication.  Gastrointestinal: Negative for heartburn, blood in stool and melena.  Genitourinary: Negative for hematuria.  Musculoskeletal: Positive for joint pain (Still has bilateral knee pain and gout in knees and feet/ankles - limiting exercise). Negative for falls.       Chest wall discomfort postop - has essentially gone.  Neurological: Negative for dizziness (NO longer notes the positional dizziness) and headaches.  Endo/Heme/Allergies: Positive for environmental allergies.  Psychiatric/Behavioral: Negative.  Negative for memory loss. The patient is not nervous/anxious and does not have insomnia.   All other systems reviewed and are negative.  Past Medical History  Diagnosis Date  . Diabetes mellitus type 2, insulin dependent (HCC)   . Essential hypertension   . CKD (chronic kidney disease), stage III   . Obesity     BMI ~35-36  . Hyperlipidemia with target LDL less than 70 01/28/2014  . NSTEMI (non-ST elevated myocardial infarction) (HCC) 12/18/2014    Echo 1/6: EF 60-65%, no Regional WMA, Gr 1 DD  . 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 Metabolic syndrome: DM, HTN, Obesity as well as HLD 12/18/2014  . Seasonal allergies   . Asthma   . Colitis  Past Surgical History  Procedure Laterality Date  . Abdominal hysterectomy    . Tonsillectomy    . Left heart catheterization with coronary angiogram N/A 12/18/2014    Procedure: LEFT HEART CATHETERIZATION WITH CORONARY ANGIOGRAM;  Surgeon: Peter M Swaziland, MD;  Location: [facility] CATH LAB;  Service: Cardiovascular;  Laterality: N/A;  . Coronary artery bypass graft N/A 12/21/2014    Procedure: CORONARY ARTERY BYPASS GRAFTING (CABG) TIMES TWO USING LEFT INTERNAL  MAMMARY AND RIGHT SAPHENOUS LEG VEIN HARVESTED ENDOSCOPICALLY;  Surgeon: Loreli Slot, MD;  Location[facility] OR;  Service: Open Heart Surgery;  Laterality: N/A;  . Tee without cardioversion N/A 12/21/2014    Procedure: TRANSESOPHAGEAL ECHOCARDIOGRAM (TEE);  Surgeon: Loreli Slot, MD;  Location: [facility] OR;  Service: Open Heart Surgery;  Laterality: N/A;  . US echocardiography  04/09/2010    Possible small patent foramen ovale suspected;trace MR, mild TR.  [name] Kitchen Nm myocar perf wall motion  04/09/2010    Abnormal study - appears to be a small area of apical infarction. Reversible ischemia is not seen.  .  Prior to Admission medications   Medication Sig Start Date End Date Taking? Authorizing Provider  albuterol (PROVENTIL HFA;VENTOLIN HFA) 108 (90 BASE) MCG/ACT inhaler Inhale 2 puffs into the lungs every 6 (six) hours as needed. For shortness of breath or wheezing   Yes Historical Provider, MD  aspirin EC 325 MG EC tablet Take 1 tablet (325 mg total) by mouth daily. 12/27/14  Yes Erin R Barrett, PA-C  azithromycin (ZITHROMAX Z-PAK) 250 MG tablet Take 2 pills today, then 1 pill daily until gone. 01/08/16  Yes Charm Rings, MD  benzonatate (TESSALON) 200 MG capsule Take 1 capsule (200 mg total) by mouth 2 (two) times daily as needed for cough. 01/08/16  Yes Charm Rings, MD  colchicine 0.6 MG tablet Take 0.6 mg by mouth 2 (two) times daily as needed (gout).    Yes Historical Provider, MD  Dulaglutide (TRULICITY [place]) Inject into the skin. Pt is unsure of the dosage she's taking. Pt states she will call the office to let us know.   Yes Historical Provider, MD  Fluticasone-Salmeterol (ADVAIR) 100-50 MCG/DOSE AEPB Inhale 1 puff into the lungs 2 (two) times daily.   Yes Historical Provider, MD  glipiZIDE (GLUCOTROL) 10 MG tablet Take 10 mg by mouth 2 (two) times daily before a meal.   Yes Historical Provider, MD  hydrochlorothiazide (HYDRODIURIL) 25 MG tablet Take 25 mg by mouth daily.   Yes Historical  Provider, MD  insulin detemir (LEVEMIR) 100 UNIT/ML injection Inject 10 Units into the skin at bedtime.   Yes Historical Provider, MD  linagliptin (TRADJENTA) 5 MG TABS tablet Take 5 mg by mouth daily.   Yes Historical Provider, MD  loratadine (CLARITIN) 10 MG tablet Take 10 mg by mouth daily as needed for allergies.   Yes Historical Provider, MD  metoprolol tartrate (LOPRESSOR) 25 MG tablet Take 1 tablet (25 mg total) by mouth 2 (two) times daily. 05/20/16  Yes Marykay Lex, MD  ondansetron (ZOFRAN ODT) 4 MG disintegrating tablet Take 1 tablet (4 mg total) by mouth every 8 (eight) hours as needed for nausea or vomiting. 03/08/15  Yes Osvaldo Shipper, MD  oxyCODONE (OXY IR/ROXICODONE) 5 MG immediate release tablet Take 1-2 tablets (5-10 mg total) by mouth every 3 (three) hours as needed for severe pain. 12/27/14  Yes Erin R Barrett, PA-C  predniSONE (DELTASONE) 50 MG tablet Take 1 pill daily for 5 days. 01/08/16  Yes Charm Rings, MD  simvastatin (ZOCOR) 10 MG tablet Take 10 mg by mouth at bedtime.   Yes Historical Provider, MD  traMADol (ULTRAM) 50 MG tablet Take 1-2 tablets (50-100 mg total) by mouth every 4 (four) hours as needed for moderate pain. 12/27/14  Yes Erin R Barrett, PA-C    Allergies  Allergen Reactions  . Ace Inhibitors Anaphylaxis  . Lisinopril Anaphylaxis  . Mushroom Extract Complex Swelling  . Tetanus Toxoids Nausea And Vomiting and Swelling   Social History  Substance Use Topics  . Smoking status: Never Smoker   . Smokeless tobacco: Never Used  . Alcohol Use: No   Family History  Problem Relation Age of Onset  . Pancreatic cancer Mother   . Stroke Father   . CAD Mother     Open heart surgery 60s-70s  . Heart disease Sister     Unclear details    Wt Readings from Last 3 Encounters:  07/02/16 199 lb 12.8 oz (90.629 kg)  12/11/15 200 lb (90.719 kg)  05/30/15 196 lb 9.6 oz (89.177 kg)  Intentional weight loss.  PHYSICAL EXAM BP 112/78 mmHg  Pulse 74  Ht   (1.575 m)  Wt 199 lb 12.8 oz (90.629 kg)  BMI 36.53 kg/m2 General appearance: alert, cooperative, appears stated age, no distress and moderately obese HEENT: [place]/AT, EOMI, MMM, anicteric sclera Neck: no adenopathy, no carotid bruit and no JVD Lungs: clear to auscultation bilaterally, normal percussion bilaterally and non-labored Heart: regular rate and rhythm, S1 & S2 normal, no murmur, click, rub or gallop; non-displaced PMI; well healed sternotomy scar  Abdomen: soft, non-tender; bowel sounds normal; no masses,  no organomegaly; No HJR Extremities: extremities normal, atraumatic, no cyanosis; minimal pedalr edema. Pulses: 2+ and symmetric; Skin: normal and mobility and turgor normal  Neurologic: Mental status: Alert, oriented, thought content appropriate; Cranial nerves: normal (II-XII grossly intact)   Adult ECG Report  Rate: 74;  Rhythm: normal sinus rhythm and Borderline low voltage. T-wave flattening.   Narrative Interpretation: stable EKG.   Recent Labs:  Checked by PCP last month  Recent studies reviewed:   no new studies  ASSESSMENT / PLAN: Problem List Items Addressed This Visit    Seasonal allergies    Okay to take H2 blockers, however would avoid any decongestants.      Ostial LAD disease. - Primary (Chronic)    Significant ostial LAD disease noted on cardiac catheterization therefore referred for CABG with LIMA-LAD and vein graft to the diagonal. Remains on stable dose of aspirin, beta blocker and statin. No recurrent angina.        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 She is back to doing exercises were not limited by gout and has lost back some of the weight that she had regained. I congratulated her on those efforts and encouraged her to continue monitoring her diet and continued exercise.      Relevant Medications   Dulaglutide (TRULICITY [place])   NSTEMI (non-ST elevated myocardial infarction) (HCC)     Normal EF by echo with no wall motion modalities. No recurrent anginal symptoms or heart failure symptoms. Completed cardiac rehabilitation. Continues to remain active with no recurrent symptoms. On stable regimen.      Metabolic syndrome: DM, HTN, Obesity as well as HLD (Chronic)    This in and of itself is a significant risk factor for ongoing cardiac disease. Needs aggressive management  modification. Continue diet and exercise for weight loss which should help all categories. Her PCP is monitoring her lipids and diabetes. Her blood pressure is well-controlled on Lopressor and HCTZ and therefore not of concern.      Hyperlipidemia with target LDL less than 70 (Chronic)    Labs being monitored by PCP. Remains on simvastatin. Just had labs checked and was told that they'll "looked good ". Unfortunately I don't have a copy of the labs in order to know how well they're controlled. Goal LDL is less than 70.      RESOLVED: Essential hypertension (Chronic)   Relevant Orders   EKG 12-Lead     Current medicines are reviewed at length with the patient today.  No new medications  The following changes have been made:  None.    Labs/ tests ordered today include:  None Orders Placed This Encounter  Procedures  . EKG 12-Lead    Order Specific Question:  Where should this test be performed    Answer:  Other  Medications added   Meds ordered this encounter  Medications  . Fluticasone-Salmeterol (ADVAIR) 100-50 MCG/DOSE AEPB    Sig: Inhale 1 puff into the lungs 2 (two) times daily.  . Dulaglutide (TRULICITY [place])    Sig: Inject into the skin. Pt is unsure of the dosage she's taking. Pt states she will call the office to let us know.    Followup: 6 months Or sooner if needed   Bryan Lemma, M.D., M.S. Interventional Cardiologist   Pager # [phone]

## 2016-07-02 NOTE — Patient Instructions (Signed)
Your physician wants you to follow-up in: 6 months or sooner if needed. You will receive a reminder letter in the mail two months in advance. If you don't receive a letter, please call our office to schedule the follow-up appointment.   If you need a refill on your cardiac medications before your next appointment, please call your pharmacy. 

## 2016-07-04 ENCOUNTER — Encounter: Payer: Self-pay | Admitting: Cardiology

## 2016-07-04 NOTE — Assessment & Plan Note (Addendum)
Significant ostial LAD disease noted on cardiac catheterization therefore referred for CABG with LIMA-LAD and vein graft to the diagonal. Remains on stable dose of aspirin, beta blocker and statin. No recurrent angina.

## 2016-07-04 NOTE — Assessment & Plan Note (Addendum)
Labs being monitored by PCP. Remains on simvastatin. Just had labs checked and was told that they'll "looked good ". Unfortunately I don't have a copy of the labs in order to know how well they're controlled. Goal LDL is less than 70.

## 2016-07-04 NOTE — Assessment & Plan Note (Signed)
She seems to have turned her weight issues back around. She is back to doing exercises were not limited by gout and has lost back some of the weight that she had regained. I congratulated her on those efforts and encouraged her to continue monitoring her diet and continued exercise.

## 2016-07-04 NOTE — Assessment & Plan Note (Signed)
Normal EF by echo with no wall motion modalities. No recurrent anginal symptoms or heart failure symptoms. Completed cardiac rehabilitation. Continues to remain active with no recurrent symptoms. On stable regimen.

## 2016-07-04 NOTE — Assessment & Plan Note (Signed)
This in and of itself is a significant risk factor for ongoing cardiac disease. Needs aggressive management modification. Continue diet and exercise for weight loss which should help all categories. Her PCP is monitoring her lipids and diabetes. Her blood pressure is well-controlled on Lopressor and HCTZ and therefore not of concern.

## 2016-07-04 NOTE — Assessment & Plan Note (Signed)
Okay to take H2 blockers, however would avoid any decongestants.

## 2016-08-07 ENCOUNTER — Encounter (HOSPITAL_COMMUNITY): Payer: Self-pay | Admitting: Family Medicine

## 2016-08-07 ENCOUNTER — Ambulatory Visit (HOSPITAL_COMMUNITY)
Admission: EM | Admit: 2016-08-07 | Discharge: 2016-08-07 | Disposition: A | Discharge status: Home / Self Care | Payer: Medicare HMO

## 2016-08-07 DIAGNOSIS — J111 Influenza due to unidentified influenza virus with other respiratory manifestations: Secondary | ICD-10-CM

## 2016-08-07 DIAGNOSIS — R69 Illness, unspecified: Principal | ICD-10-CM

## 2016-08-07 NOTE — ED Provider Notes (Signed)
CSN: [id number]     Arrival date & time 08/07/16  1849 History   First MD Initiated Contact with Patient 08/07/16 1927     Chief Complaint  Patient presents with  . Fever  . Generalized Body Aches   (Consider location/radiation/quality/duration/timing/severity/associated sxs/prior Treatment) Patient presents with sore throat and nasal congestion X 2 days with sudden onset of generalized body aches and fever at 16:00 today.  Condition is acute in nature. Condition is made better by nothing Condition is made worse by nothing. Patient denies any relief from OTC generic tylenol taken prior to there arrival at this facility. Patient denies any respiratory issues or chest pain.   pateint declines tamiflu       Past Medical History:  Diagnosis Date  . Asthma   . CKD (chronic kidney disease), stage III   . Colitis   . Diabetes mellitus type 2, insulin dependent (HCC)   . Essential hypertension   . Hyperlipidemia with target LDL less than 70 01/28/2014  . Metabolic syndrome: DM, HTN, Obesity as well as HLD 12/18/2014  . NSTEMI (non-ST elevated myocardial infarction) (HCC) 12/18/2014   Echo 1/6: EF 60-65%, no Regional WMA, Gr 1 DD  . Obesity    BMI ~35-36  . 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 Seasonal allergies    Past Surgical History:  Procedure Laterality Date  . ABDOMINAL HYSTERECTOMY    . CORONARY ARTERY BYPASS GRAFT N/A 12/21/2014   Procedure: CORONARY ARTERY BYPASS GRAFTING (CABG) TIMES TWO USING LEFT INTERNAL MAMMARY AND RIGHT SAPHENOUS LEG VEIN HARVESTED ENDOSCOPICALLY;  Surgeon: Loreli Slot, MD;  Location: [facility] OR;  Service: Open Heart Surgery;  Laterality: N/A;  . LEFT HEART CATHETERIZATION WITH CORONARY ANGIOGRAM N/A 12/18/2014   Procedure: LEFT HEART CATHETERIZATION WITH CORONARY ANGIOGRAM;  Surgeon: Peter M Swaziland, MD;  Location: [facility] CATH LAB;  Service: Cardiovascular;  Laterality: N/A;  .  NM MYOCAR PERF WALL MOTION  04/09/2010   Abnormal study - appears to be a small area of apical infarction. Reversible ischemia is not seen.  . TEE WITHOUT CARDIOVERSION N/A 12/21/2014   Procedure: TRANSESOPHAGEAL ECHOCARDIOGRAM (TEE);  Surgeon: Loreli Slot, MD;  Location: [facility] OR;  Service: Open Heart Surgery;  Laterality: N/A;  . TONSILLECTOMY    . US ECHOCARDIOGRAPHY  04/09/2010   Possible small patent foramen ovale suspected;trace MR, mild TR.   Family History  Problem Relation Age of Onset  . Pancreatic cancer Mother   . CAD Mother     Open heart surgery 60s-70s  . Stroke Father   . Heart disease Sister     Unclear details   Social History  Substance Use Topics  . Smoking status: Never Smoker  . Smokeless tobacco: Never Used  . Alcohol use No   OB History    Gravida Para Term Preterm AB Living   5 4 4   1 4    SAB TAB Ectopic Multiple Live Births   1       4     Review of Systems  Constitutional: Positive for fever.  HENT: Positive for congestion, rhinorrhea and sore throat. Negative for ear discharge, postnasal drip, sinus pressure and sneezing.   Respiratory: Negative.   Cardiovascular: Negative.     Allergies  Ace inhibitors; Lisinopril; Mushroom extract complex; and Tetanus toxoids  Home Medications   Prior to Admission medications   Medication Sig  Start Date End Date Taking? Authorizing Provider  albuterol (PROVENTIL HFA;VENTOLIN HFA) 108 (90 BASE) MCG/ACT inhaler Inhale 2 puffs into the lungs every 6 (six) hours as needed. For shortness of breath or wheezing    Historical Provider, MD  aspirin EC 325 MG EC tablet Take 1 tablet (325 mg total) by mouth daily. 12/27/14   Erin R Barrett, PA-C  azithromycin (ZITHROMAX Z-PAK) 250 MG tablet Take 2 pills today, then 1 pill daily until gone. 01/08/16   Charm Rings, MD  benzonatate (TESSALON) 200 MG capsule Take 1 capsule (200 mg total) by mouth 2 (two) times daily as needed for cough. 01/08/16   Charm Rings, MD   colchicine 0.6 MG tablet Take 0.6 mg by mouth 2 (two) times daily as needed (gout).     Historical Provider, MD  Dulaglutide (TRULICITY [place]) Inject into the skin. Pt is unsure of the dosage she's taking. Pt states she will call the office to let us know.    Historical Provider, MD  Fluticasone-Salmeterol (ADVAIR) 100-50 MCG/DOSE AEPB Inhale 1 puff into the lungs 2 (two) times daily.    Historical Provider, MD  glipiZIDE (GLUCOTROL) 10 MG tablet Take 10 mg by mouth 2 (two) times daily before a meal.    Historical Provider, MD  hydrochlorothiazide (HYDRODIURIL) 25 MG tablet Take 25 mg by mouth daily.    Historical Provider, MD  insulin detemir (LEVEMIR) 100 UNIT/ML injection Inject 10 Units into the skin at bedtime.    Historical Provider, MD  linagliptin (TRADJENTA) 5 MG TABS tablet Take 5 mg by mouth daily.    Historical Provider, MD  loratadine (CLARITIN) 10 MG tablet Take 10 mg by mouth daily as needed for allergies.    Historical Provider, MD  metoprolol tartrate (LOPRESSOR) 25 MG tablet Take 1 tablet (25 mg total) by mouth 2 (two) times daily. 05/20/16   Marykay Lex, MD  ondansetron (ZOFRAN ODT) 4 MG disintegrating tablet Take 1 tablet (4 mg total) by mouth every 8 (eight) hours as needed for nausea or vomiting. 03/08/15   Osvaldo Shipper, MD  oxyCODONE (OXY IR/ROXICODONE) 5 MG immediate release tablet Take 1-2 tablets (5-10 mg total) by mouth every 3 (three) hours as needed for severe pain. 12/27/14   Erin R Barrett, PA-C  predniSONE (DELTASONE) 50 MG tablet Take 1 pill daily for 5 days. 01/08/16   Charm Rings, MD  simvastatin (ZOCOR) 10 MG tablet Take 10 mg by mouth at bedtime.    Historical Provider, MD  traMADol (ULTRAM) 50 MG tablet Take 1-2 tablets (50-100 mg total) by mouth every 4 (four) hours as needed for moderate pain. 12/27/14   Erin R Barrett, PA-C   Meds Ordered and Administered this Visit  Medications - No data to display  BP 113/65   Pulse 103   Temp 100.3 F (37.9 C)   Resp  18   SpO2 100%  No data found.   Physical Exam  Constitutional: She is oriented to person, place, and time. She appears well-developed and well-nourished.  HENT:  Nasal congestion noted to bilateral  nares.   Cardiovascular: Normal rate and regular rhythm.   Pulmonary/Chest: Effort normal and breath sounds normal.  Neurological: She is alert and oriented to person, place, and time.  Skin: Skin is warm and dry.    Urgent Care Course   Clinical Course    Procedures (including critical care time)  Labs Review Labs Reviewed - No data to display  Imaging Review No  results found.   Visual Acuity Review  Right Eye Distance:   Left Eye Distance:   Bilateral Distance:    Right Eye Near:   Left Eye Near:    Bilateral Near:         MDM   1. Influenza-like illness       Alene MiresJennifer C Omohundro, NP 08/07/16 1943

## 2016-08-07 NOTE — ED Triage Notes (Signed)
Pt here for aches all over, fever, and weakness. Denise any cough, urinary symptoms. Denies N,V,D.

## 2016-08-07 NOTE — Discharge Instructions (Signed)
Continue to take OTC tylenol and motrin and push fluids

## 2016-08-13 ENCOUNTER — Ambulatory Visit: Payer: Medicare Other | Admitting: Podiatry

## 2016-08-13 DIAGNOSIS — J019 Acute sinusitis, unspecified: Secondary | ICD-10-CM | POA: Diagnosis not present

## 2016-08-13 DIAGNOSIS — I10 Essential (primary) hypertension: Secondary | ICD-10-CM | POA: Diagnosis not present

## 2016-08-13 DIAGNOSIS — J4520 Mild intermittent asthma, uncomplicated: Secondary | ICD-10-CM | POA: Diagnosis not present

## 2016-08-13 DIAGNOSIS — E1321 Other specified diabetes mellitus with diabetic nephropathy: Secondary | ICD-10-CM | POA: Diagnosis not present

## 2016-08-25 ENCOUNTER — Other Ambulatory Visit: Payer: Self-pay | Admitting: Internal Medicine

## 2016-08-25 DIAGNOSIS — Z1231 Encounter for screening mammogram for malignant neoplasm of breast: Secondary | ICD-10-CM

## 2016-09-22 DIAGNOSIS — E1321 Other specified diabetes mellitus with diabetic nephropathy: Secondary | ICD-10-CM | POA: Diagnosis not present

## 2016-09-22 DIAGNOSIS — M25539 Pain in unspecified wrist: Secondary | ICD-10-CM | POA: Diagnosis not present

## 2016-09-22 DIAGNOSIS — M1A9XX0 Chronic gout, unspecified, without tophus (tophi): Secondary | ICD-10-CM | POA: Diagnosis not present

## 2016-09-22 DIAGNOSIS — Z23 Encounter for immunization: Secondary | ICD-10-CM | POA: Diagnosis not present

## 2016-09-22 DIAGNOSIS — I10 Essential (primary) hypertension: Secondary | ICD-10-CM | POA: Diagnosis not present

## 2016-10-01 ENCOUNTER — Ambulatory Visit: Payer: Medicare HMO

## 2016-11-03 DIAGNOSIS — N183 Chronic kidney disease, stage 3 (moderate): Secondary | ICD-10-CM | POA: Diagnosis not present

## 2016-11-03 DIAGNOSIS — Z1239 Encounter for other screening for malignant neoplasm of breast: Secondary | ICD-10-CM | POA: Diagnosis not present

## 2016-11-03 DIAGNOSIS — I10 Essential (primary) hypertension: Secondary | ICD-10-CM | POA: Diagnosis not present

## 2016-11-03 DIAGNOSIS — M1A9XX0 Chronic gout, unspecified, without tophus (tophi): Secondary | ICD-10-CM | POA: Diagnosis not present

## 2016-11-03 DIAGNOSIS — I2510 Atherosclerotic heart disease of native coronary artery without angina pectoris: Secondary | ICD-10-CM | POA: Diagnosis not present

## 2016-11-03 DIAGNOSIS — E2839 Other primary ovarian failure: Secondary | ICD-10-CM | POA: Diagnosis not present

## 2016-11-03 DIAGNOSIS — E1321 Other specified diabetes mellitus with diabetic nephropathy: Secondary | ICD-10-CM | POA: Diagnosis not present

## 2016-11-03 DIAGNOSIS — M25539 Pain in unspecified wrist: Secondary | ICD-10-CM | POA: Diagnosis not present

## 2016-11-10 ENCOUNTER — Other Ambulatory Visit: Payer: Self-pay | Admitting: Internal Medicine

## 2016-11-10 DIAGNOSIS — E2839 Other primary ovarian failure: Secondary | ICD-10-CM

## 2016-12-17 DIAGNOSIS — J4520 Mild intermittent asthma, uncomplicated: Secondary | ICD-10-CM | POA: Diagnosis not present

## 2016-12-17 DIAGNOSIS — I10 Essential (primary) hypertension: Secondary | ICD-10-CM | POA: Diagnosis not present

## 2016-12-17 DIAGNOSIS — M1A9XX0 Chronic gout, unspecified, without tophus (tophi): Secondary | ICD-10-CM | POA: Diagnosis not present

## 2016-12-17 DIAGNOSIS — E1321 Other specified diabetes mellitus with diabetic nephropathy: Secondary | ICD-10-CM | POA: Diagnosis not present

## 2016-12-18 ENCOUNTER — Ambulatory Visit
Admission: RE | Admit: 2016-12-18 | Discharge: 2016-12-18 | Disposition: A | Discharge status: Home / Self Care | Payer: Medicare HMO | Source: Ambulatory Visit | Attending: Internal Medicine | Admitting: Internal Medicine

## 2016-12-18 ENCOUNTER — Ambulatory Visit
Admission: RE | Admit: 2016-12-18 | Discharge: 2016-12-18 | Disposition: A | Discharge status: Home / Self Care | Payer: Self-pay | Source: Ambulatory Visit | Attending: Internal Medicine | Admitting: Internal Medicine

## 2016-12-18 DIAGNOSIS — Z1231 Encounter for screening mammogram for malignant neoplasm of breast: Secondary | ICD-10-CM

## 2016-12-18 DIAGNOSIS — E2839 Other primary ovarian failure: Secondary | ICD-10-CM

## 2016-12-26 IMAGING — CR DG CHEST 2V
2 series · 2 of 2 positions shown · non-contrast
Comparison: 12/25/2014

CLINICAL DATA: CABG.

EXAM:
CHEST  2 VIEW

[w chest pa]
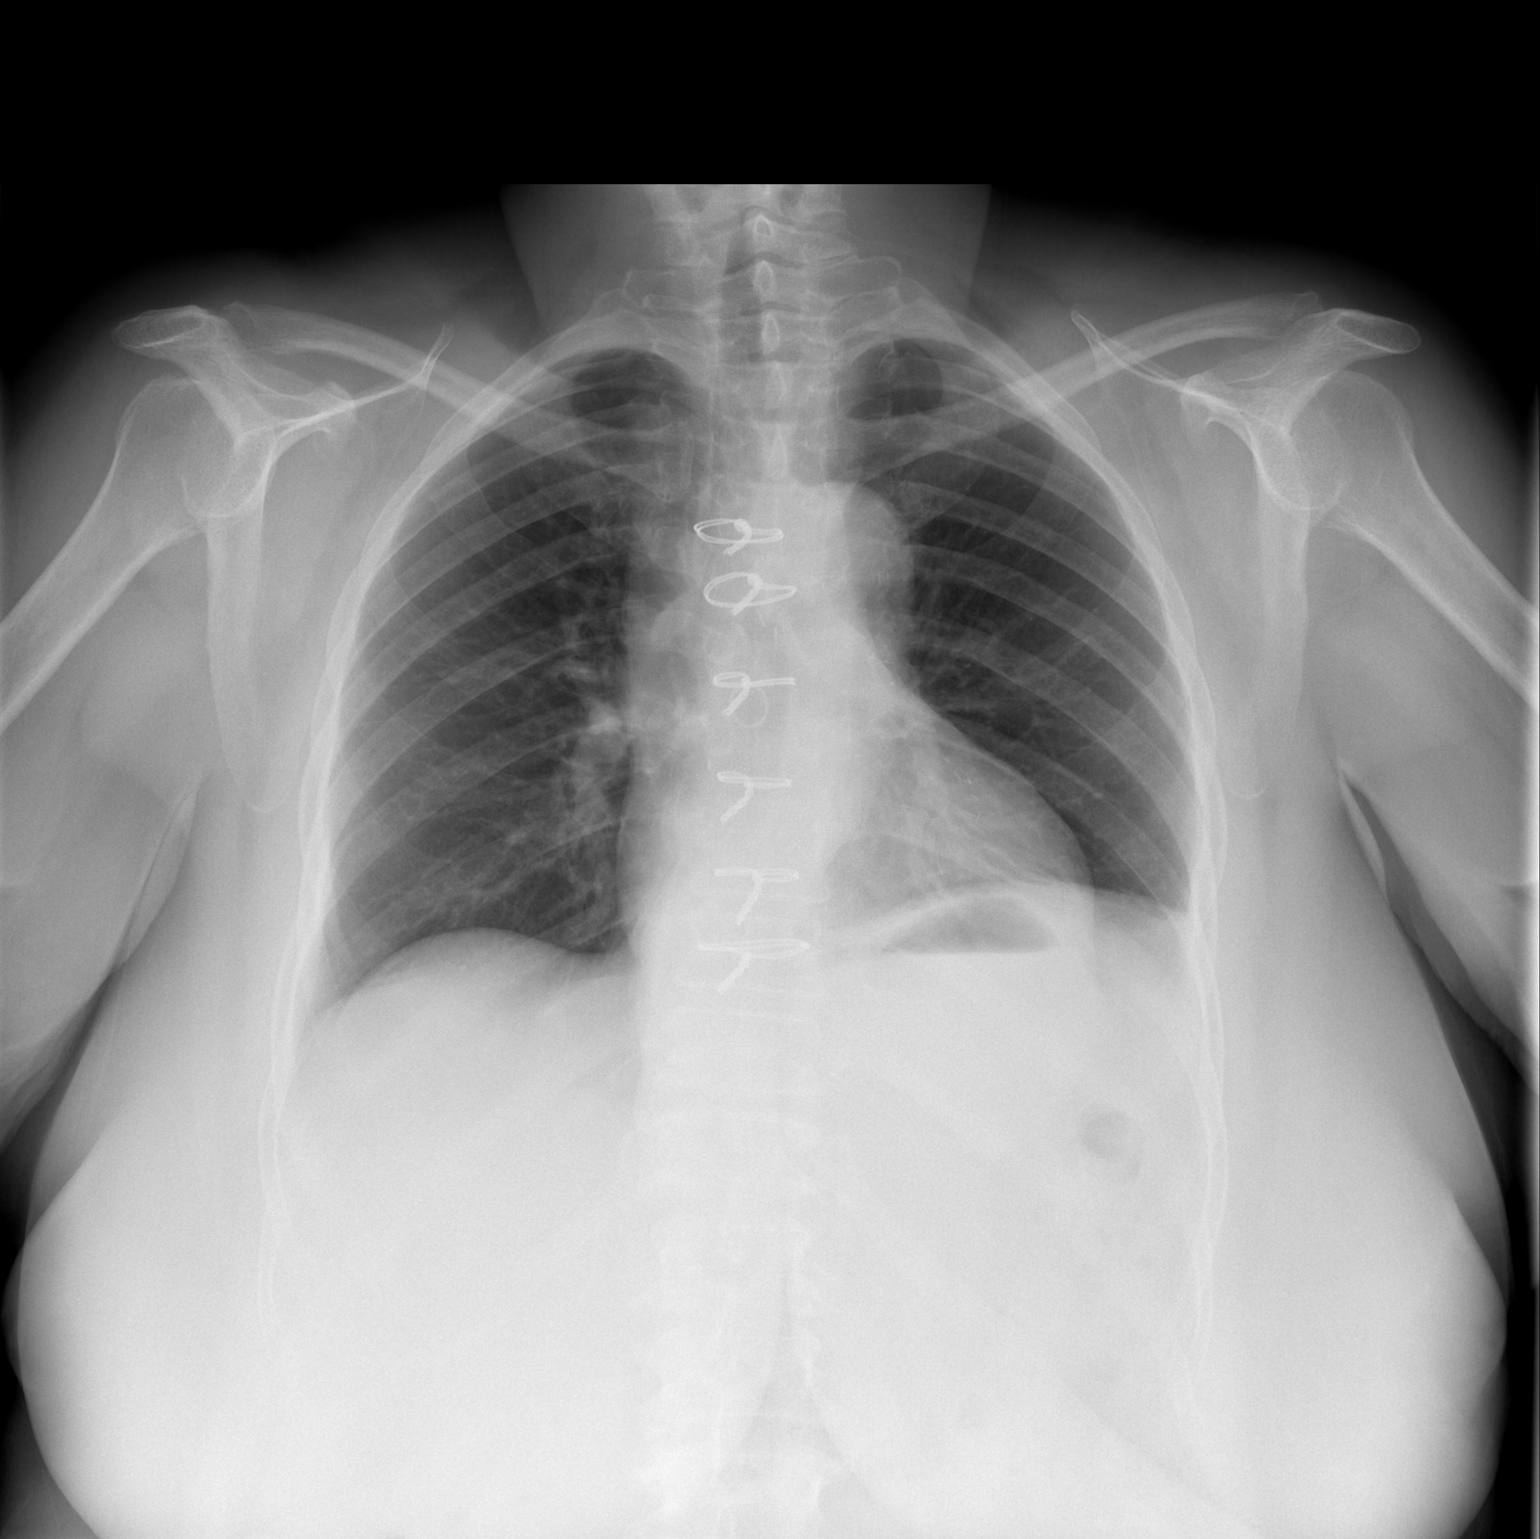

[w chest lat]
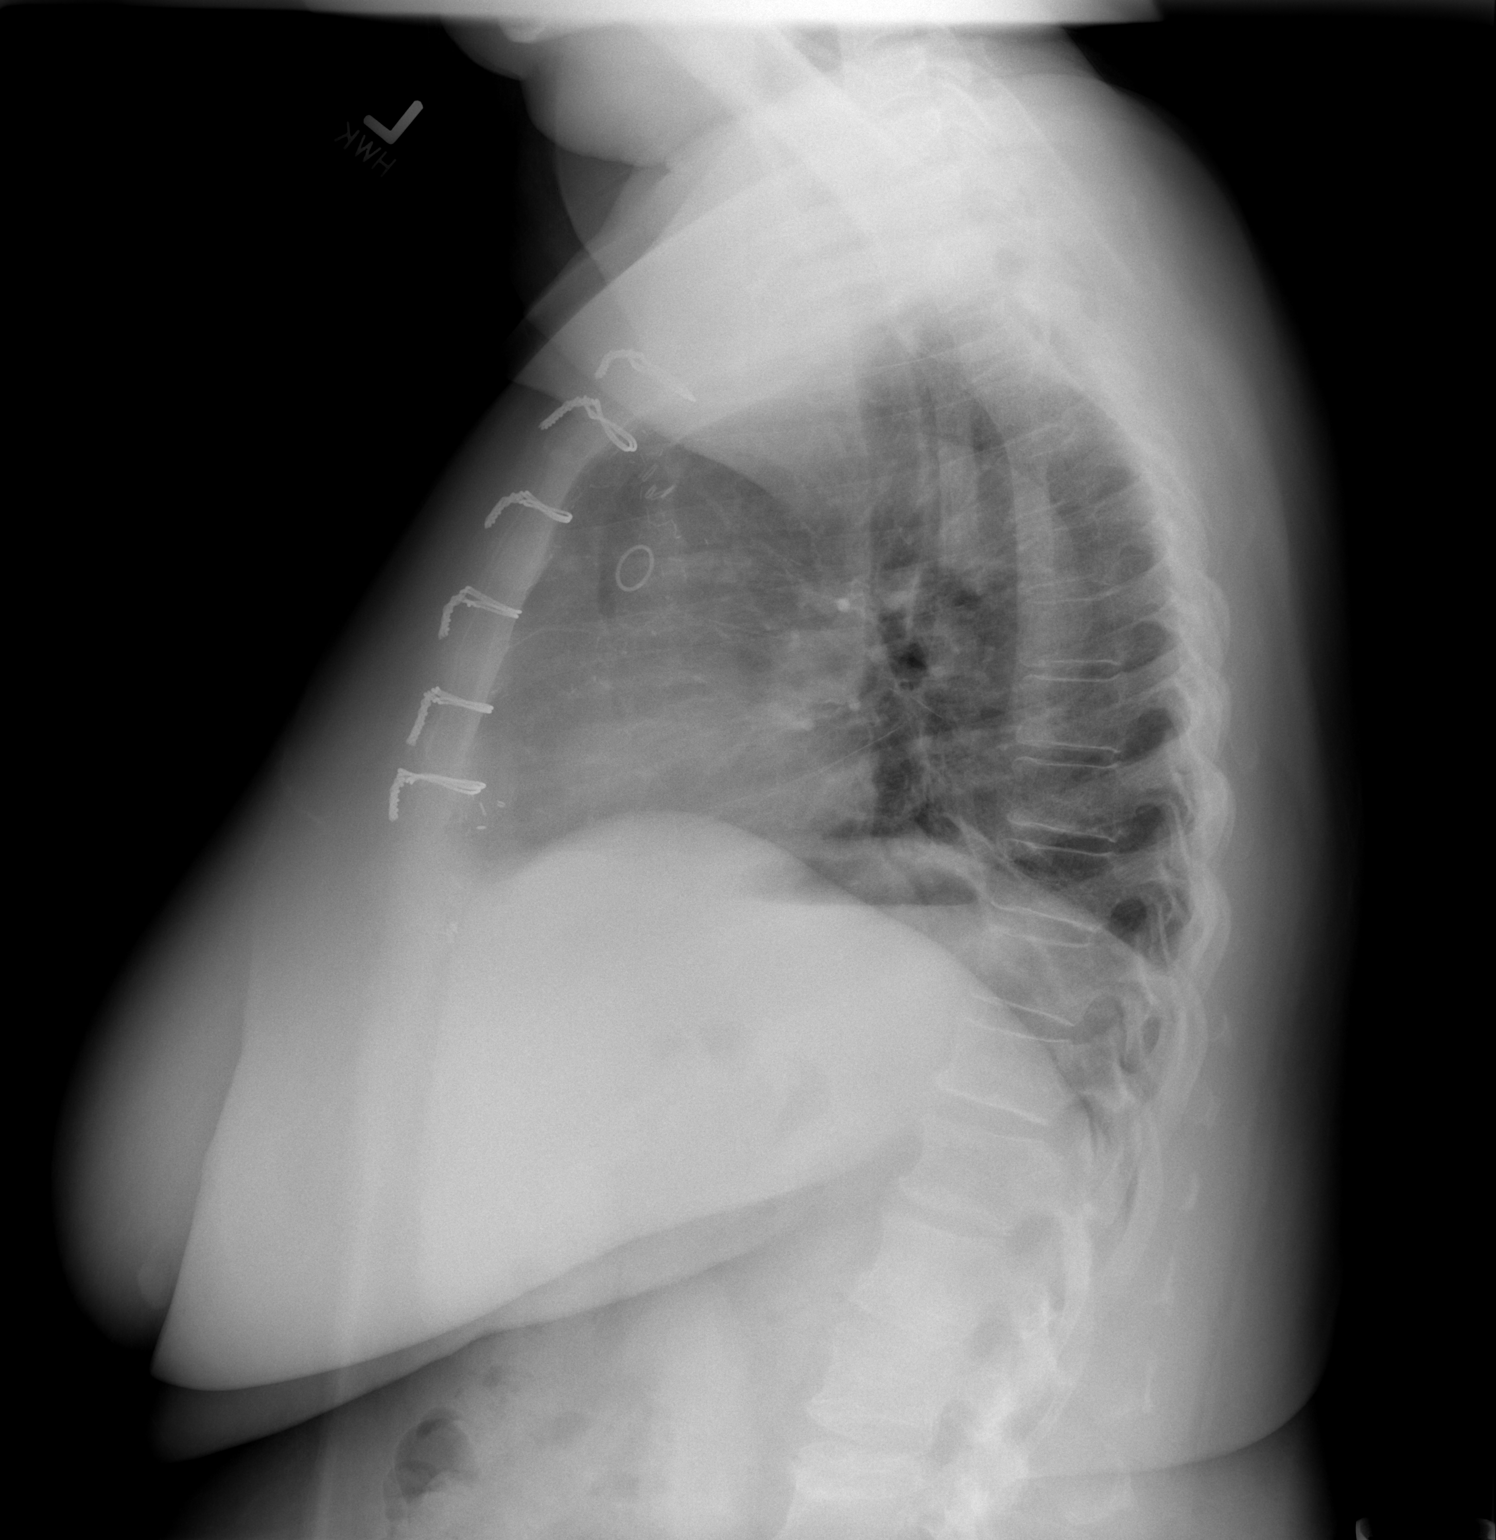

[2 of 2 positions shown; findings below may reference images not displayed]

FINDINGS: Mediastinum and hilar structures are normal. Prior CABG. Heart size
normal. Interim near complete clearing of the lung bases with mild
residual left base subsegmental atelectasis and tiny left pleural
effusion. No acute bony abnormality.
IMPRESSION: 1. Interim clearing of lung bases with mild residual left left base
subsegmental atelectasis. Tiny left pleural effusion remains.
2. Prior CABG.  Heart size normal

## 2017-01-08 DIAGNOSIS — H40013 Open angle with borderline findings, low risk, bilateral: Secondary | ICD-10-CM | POA: Diagnosis not present

## 2017-01-08 DIAGNOSIS — H16223 Keratoconjunctivitis sicca, not specified as Sjogren's, bilateral: Secondary | ICD-10-CM | POA: Diagnosis not present

## 2017-01-26 DIAGNOSIS — E1321 Other specified diabetes mellitus with diabetic nephropathy: Secondary | ICD-10-CM | POA: Diagnosis not present

## 2017-01-26 DIAGNOSIS — E784 Other hyperlipidemia: Secondary | ICD-10-CM | POA: Diagnosis not present

## 2017-01-26 DIAGNOSIS — K219 Gastro-esophageal reflux disease without esophagitis: Secondary | ICD-10-CM | POA: Diagnosis not present

## 2017-01-26 DIAGNOSIS — I10 Essential (primary) hypertension: Secondary | ICD-10-CM | POA: Diagnosis not present

## 2017-02-05 ENCOUNTER — Ambulatory Visit (INDEPENDENT_AMBULATORY_CARE_PROVIDER_SITE_OTHER): Payer: Commercial Managed Care - HMO | Admitting: Student

## 2017-02-05 ENCOUNTER — Encounter: Payer: Self-pay | Admitting: Student

## 2017-02-05 VITALS — BP 130/87 | HR 73 | Ht 62.0 in | Wt 198.0 lb

## 2017-02-05 DIAGNOSIS — E1165 Type 2 diabetes mellitus with hyperglycemia: Secondary | ICD-10-CM

## 2017-02-05 DIAGNOSIS — E785 Hyperlipidemia, unspecified: Secondary | ICD-10-CM | POA: Diagnosis not present

## 2017-02-05 DIAGNOSIS — Z951 Presence of aortocoronary bypass graft: Secondary | ICD-10-CM | POA: Diagnosis not present

## 2017-02-05 DIAGNOSIS — I2510 Atherosclerotic heart disease of native coronary artery without angina pectoris: Secondary | ICD-10-CM | POA: Diagnosis not present

## 2017-02-05 DIAGNOSIS — I10 Essential (primary) hypertension: Secondary | ICD-10-CM | POA: Diagnosis not present

## 2017-02-05 DIAGNOSIS — IMO0001 Reserved for inherently not codable concepts without codable children: Secondary | ICD-10-CM

## 2017-02-05 DIAGNOSIS — Z794 Long term (current) use of insulin: Secondary | ICD-10-CM

## 2017-02-05 MED ORDER — METOPROLOL TARTRATE 25 MG PO TABS: 25.0000 mg | ORAL_TABLET | Freq: Two times a day (BID) | ORAL | 3 refills | Status: DC

## 2017-02-05 NOTE — Patient Instructions (Signed)
Your physician recommends that you continue on your current medications as directed. Please refer to the Current Medication list given to you today.   FOLLOW UP: Monday, July 2nd at 11:00AM with Dr. Herbie BaltimoreHarding [facility]

## 2017-02-05 NOTE — Progress Notes (Signed)
Cardiology Office Note    Date:  02/05/2017   ID:  Cheryl Gill, Cheryl Gill 05/02/1951, MRN [id number]  PCP:  Dorrene German, MD  Cardiologist: Dr. Herbie Baltimore   Chief Complaint  Patient presents with  . Follow-up    History of Present Illness:    Cheryl Gill is a 66 y.o. female with past medical history of CAD (s/p CABG in 12/2014 with LIMA-LAD and SVG-D1), HTN, HLD, Type 2 DM, and Stage 3 CKD who presents to the office today for routine follow-up.   Was last seen by Dr. Herbie Baltimore in 06/2016 and reported doing well from a cardiac perspective. Was unable to exercise regularly secondary to gout pain. Was continued on ASA 325mg  daily, HCTZ 25mg  daily, Lopressor 25mg  BID, and Simvastatin 10mg  daily.   In talking with the patient today, she reports doing well from a cardiac perspective over the past several months. She denies any recent chest discomfort, dyspnea with exertion, orthopnea, PND, lower extremity edema, palpitations, or presyncope.  Reports good compliance with her medication regimen. No evidence of active bleeding with ASA.   Has been experiencing left wrist pain which she has been told is secondary to arthritis. Has her wrist in a soft brace today. Takes Tylenol as needed for the pain.   She has been trying to watch her diet more closely and reports losing 5 pounds over the past 2 months. She was congratulated on this.  She does not check her blood pressure regularly at home but says it was within normal range at her PCP visit last week. Had blood work checked last week as well and says her A1c was 8.0 and cholesterol was "normal". Unsure of the exact values.   She denies any tobacco use, alcohol use, or recreational drug use.   Past Medical History:  Diagnosis Date  . Asthma   . CAD (coronary artery disease)    a. 12/2014: NSTEMI with cath showing Ostial LAD ~95%, mLAD 80-90%; EF ~40-45%, LAD disease compromised major D1 branch. CABG recommended and performed with  LIMA-LAD and SVG-D1.   [name] Kitchen CKD (chronic kidney disease), stage III   . Colitis   . Diabetes mellitus type 2, insulin dependent (HCC)   . Essential hypertension   . Hyperlipidemia with target LDL less than 70 01/28/2014  . Metabolic syndrome: DM, HTN, Obesity as well as HLD 12/18/2014  . NSTEMI (non-ST elevated myocardial infarction) (HCC) 12/18/2014   Echo 1/6: EF 60-65%, no Regional WMA, Gr 1 DD  . Obesity    BMI ~35-36  . 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 Seasonal allergies     Past Surgical History:  Procedure Laterality Date  . ABDOMINAL HYSTERECTOMY    . CORONARY ARTERY BYPASS GRAFT N/A 12/21/2014   Procedure: CORONARY ARTERY BYPASS GRAFTING (CABG) TIMES TWO USING LEFT INTERNAL MAMMARY AND RIGHT SAPHENOUS LEG VEIN HARVESTED ENDOSCOPICALLY;  Surgeon: Loreli Slot, MD;  Location: [facility] OR;  Service: Open Heart Surgery;  Laterality: N/A;  . LEFT HEART CATHETERIZATION WITH CORONARY ANGIOGRAM N/A 12/18/2014   Procedure: LEFT HEART CATHETERIZATION WITH CORONARY ANGIOGRAM;  Surgeon: Peter M Swaziland, MD;  Location: [facility] CATH LAB;  Service: Cardiovascular;  Laterality: N/A;  . NM MYOCAR PERF WALL MOTION  04/09/2010   Abnormal study - appears to be a small area of apical infarction. Reversible ischemia is not seen.  . TEE WITHOUT CARDIOVERSION N/A 12/21/2014   Procedure: TRANSESOPHAGEAL ECHOCARDIOGRAM (TEE);  Surgeon: Loreli Slot, MD;  Location: [facility]  OR;  Service: Open Heart Surgery;  Laterality: N/A;  . TONSILLECTOMY    . US ECHOCARDIOGRAPHY  04/09/2010   Possible small patent foramen ovale suspected;trace MR, mild TR.    Current Medications: Outpatient Medications Prior to Visit  Medication Sig Dispense Refill  . albuterol (PROVENTIL HFA;VENTOLIN HFA) 108 (90 BASE) MCG/ACT inhaler Inhale 2 puffs into the lungs every 6 (six) hours as needed. For shortness of breath or wheezing    . aspirin EC 325 MG EC tablet Take 1 tablet (325 mg total) by mouth daily. 30 tablet 0  . benzonatate  (TESSALON) 200 MG capsule Take 1 capsule (200 mg total) by mouth 2 (two) times daily as needed for cough. 20 capsule 0  . colchicine 0.6 MG tablet Take 0.6 mg by mouth 2 (two) times daily as needed (gout).     . Dulaglutide (TRULICITY [place]) Inject into the skin. Pt is unsure of the dosage she's taking. Pt states she will call the office to let us know.    . Fluticasone-Salmeterol (ADVAIR) 100-50 MCG/DOSE AEPB Inhale 1 puff into the lungs 2 (two) times daily.    [name] Kitchen glipiZIDE (GLUCOTROL) 10 MG tablet Take 10 mg by mouth 2 (two) times daily before a meal.    . hydrochlorothiazide (HYDRODIURIL) 25 MG tablet Take 25 mg by mouth daily.    . insulin detemir (LEVEMIR) 100 UNIT/ML injection Inject 10 Units into the skin at bedtime.    [name] Kitchen linagliptin (TRADJENTA) 5 MG TABS tablet Take 5 mg by mouth daily.    [name] Kitchen loratadine (CLARITIN) 10 MG tablet Take 10 mg by mouth daily as needed for allergies.    [name] Kitchen ondansetron (ZOFRAN ODT) 4 MG disintegrating tablet Take 1 tablet (4 mg total) by mouth every 8 (eight) hours as needed for nausea or vomiting. 20 tablet 0  . oxyCODONE (OXY IR/ROXICODONE) 5 MG immediate release tablet Take 1-2 tablets (5-10 mg total) by mouth every 3 (three) hours as needed for severe pain. 30 tablet 0  . simvastatin (ZOCOR) 10 MG tablet Take 10 mg by mouth at bedtime.    . traMADol (ULTRAM) 50 MG tablet Take 1-2 tablets (50-100 mg total) by mouth every 4 (four) hours as needed for moderate pain. 30 tablet 0  . azithromycin (ZITHROMAX Z-PAK) 250 MG tablet Take 2 pills today, then 1 pill daily until gone. 6 tablet 0  . metoprolol tartrate (LOPRESSOR) 25 MG tablet Take 1 tablet (25 mg total) by mouth 2 (two) times daily. 180 tablet 1  . predniSONE (DELTASONE) 50 MG tablet Take 1 pill daily for 5 days. 5 tablet 0   No facility-administered medications prior to visit.      Allergies:   Ace inhibitors; Lisinopril; Mushroom extract complex; and Tetanus toxoids   Social History   Social History  .  Marital status: Single    Spouse name: N/A  . Number of children: N/A  . Years of education: N/A   Occupational History  .      Retired Midwife   Social History Main Topics  . Smoking status: Never Smoker  . Smokeless tobacco: Never Used  . Alcohol use No  . Drug use: No  . Sexual activity: Not Currently    Birth control/ protection: Surgical   Other Topics Concern  . None   Social History Narrative  . None     Family History:  The patient's family history includes CAD in her mother; Heart disease in her sister; Pancreatic cancer in her  mother; Stroke in her father.   Review of Systems:   Please see the history of present illness.     General:  No chills, fever, night sweats or weight changes.  Cardiovascular:  No chest pain, dyspnea on exertion, edema, orthopnea, palpitations, paroxysmal nocturnal dyspnea. Dermatological: No rash, lesions/masses Respiratory: No cough, dyspnea Urologic: No hematuria, dysuria Abdominal:   No nausea, vomiting, diarrhea, bright red blood per rectum, melena, or hematemesis Neurologic:  No visual changes, wkns, changes in mental status. MSK: Positive for left wrist pain.   All other systems reviewed and are otherwise negative except as noted above.   Physical Exam:    VS:  BP 130/87   Pulse 73   Ht 5\' 2"  (1.575 m)   Wt 198 lb (89.8 kg)   BMI 36.21 kg/m    General: Well developed, overweight African American female appearing in no acute distress. Head: Normocephalic, atraumatic, sclera non-icteric, no xanthomas, nares are without discharge.  Neck: No carotid bruits. JVD not elevated.  Lungs: Respirations regular and unlabored, without wheezes or rales.  Heart: Regular rate and rhythm. No S3 or S4.  No murmur, no rubs, or gallops appreciated. Sternal scar noted.  Abdomen: Soft, non-tender, non-distended with normoactive bowel sounds. No hepatomegaly. No rebound/guarding. No obvious abdominal masses. Msk:  Strength and tone appear  normal for age. No joint deformities or effusions. Left wrist in support brace.  Extremities: No clubbing or cyanosis. No edema.  Distal pedal pulses are 2+ bilaterally. Neuro: Alert and oriented X 3. Moves all extremities spontaneously. No focal deficits noted. Psych:  Responds to questions appropriately with a normal affect. Skin: No rashes or lesions noted  Wt Readings from Last 3 Encounters:  02/05/17 198 lb (89.8 kg)  07/02/16 199 lb 12.8 oz (90.6 kg)  12/11/15 200 lb (90.7 kg)     Studies/Labs Reviewed:   EKG:  EKG is not ordered today.    Recent Labs: No results found for requested labs within last 8760 hours.   Lipid Panel    Component Value Date/Time   CHOL 110 12/18/2014 0340   TRIG 56 12/18/2014 0340   HDL 37 (L) 12/18/2014 0340   CHOLHDL 3.0 12/18/2014 0340   VLDL 11 12/18/2014 0340   LDLCALC 62 12/18/2014 0340    Additional studies/ records that were reviewed today include:   Echocardiogram: 12/2014 Study Conclusions  - Left ventricle: The cavity size was normal. Wall thickness was normal. Systolic function was normal. The estimated ejection fraction was in the range of 60% to 65%. Wall motion was normal; there were no regional wall motion abnormalities. Doppler parameters are consistent with abnormal left ventricular relaxation (grade 1 diastolic dysfunction). The E/e&' ratio is between 8-15, suggesting indeterminate LV filling pressure. - Left atrium: The atrium was normal in size. - Tricuspid valve: There was trivial regurgitation. - Pulmonary arteries: PA peak pressure: 23 mm Hg (S).  Impressions:  - LVEF 60-65%, normal wall thickness, normal wall motion, diastolic dysfunction, indeterminate LV filling pressure, normal LA size.  Assessment:    1. Coronary artery disease involving native coronary artery of native heart without angina pectoris   2. S/P CABG x 2   3. Essential hypertension   4. Hyperlipidemia, unspecified  hyperlipidemia type   5. Uncontrolled type 2 diabetes mellitus without complication, with long-term current use of insulin (HCC)      Plan:   In order of problems listed above:  1. CAD without Angina - s/p CABG in 12/2014 with LIMA-LAD  and SVG-D1.  - she reports doing well, denying any recent chest discomfort or dyspnea with exertion.  - continue with ASA 325mg  daily, HCTZ 25mg  daily, Lopressor 25mg  BID, and Simvastatin 10mg  daily. She is currently taking Tylenol as needed for arthritis pain. Recommended continuing with this as needed and avoiding NSAIDS (Motrin/Aleve) due to being on full-strength ASA.   2. Essential HTN - BP at 130/87 during today's visit. - continue HCTZ 25mg  daily and Lopressor 25mg  BID.   3. HLD - reports Lipid Panel just checked by PCP last week and "normal". Unsure of exact levels.  - reviewed with the patient her LDL should be < 70 with known CAD. - continue Simvastatin 10mg  daily.   4. Type 2 DM - reports A1c elevated to 8.0 on most recent check. - She is actively trying to lose weight, having lost over 5 pounds in the past 2 months. Was congratulated on this with continued diet and exercise encouraged. - followed by PCP.    Medication Adjustments/Labs and Tests Ordered: Current medicines are reviewed at length with the patient today.  Concerns regarding medicines are outlined above.  Medication changes, Labs and Tests ordered today are listed in the Patient Instructions below.  Patient Instructions  Your physician recommends that you continue on your current medications as directed. Please refer to the Current Medication list given to you today.  FOLLOW UP: Monday, July 2nd at 11:00AM with Dr. [facility]Harding [facility]    Signed, Lennart PallBrittany M Fort HallStrader, [place]  02/05/2017 1:47 PM    [facility] [address], [address] [place], [place]  [address] Phone: [phone]) [phone]; Fax: [phone]) [phone]  [address], [address]  [place], [place] [address] Phone: [phone]

## 2017-02-08 ENCOUNTER — Telehealth (HOSPITAL_COMMUNITY): Payer: Self-pay | Admitting: Emergency Medicine

## 2017-02-08 ENCOUNTER — Ambulatory Visit (HOSPITAL_COMMUNITY)
Admission: EM | Admit: 2017-02-08 | Discharge: 2017-02-08 | Disposition: A | Discharge status: Home / Self Care | Payer: Medicare HMO | Attending: Family Medicine | Admitting: Family Medicine

## 2017-02-08 ENCOUNTER — Encounter (HOSPITAL_COMMUNITY): Payer: Self-pay | Admitting: Family Medicine

## 2017-02-08 DIAGNOSIS — R197 Diarrhea, unspecified: Secondary | ICD-10-CM

## 2017-02-08 DIAGNOSIS — H9202 Otalgia, left ear: Secondary | ICD-10-CM | POA: Diagnosis not present

## 2017-02-08 DIAGNOSIS — A09 Infectious gastroenteritis and colitis, unspecified: Secondary | ICD-10-CM

## 2017-02-08 DIAGNOSIS — R0982 Postnasal drip: Secondary | ICD-10-CM

## 2017-02-08 DIAGNOSIS — H6983 Other specified disorders of Eustachian tube, bilateral: Secondary | ICD-10-CM | POA: Diagnosis not present

## 2017-02-08 DIAGNOSIS — J029 Acute pharyngitis, unspecified: Secondary | ICD-10-CM | POA: Diagnosis not present

## 2017-02-08 MED ORDER — PHENYLEPHRINE-CHLORPHEN-DM 10-4-12.5 MG/5ML PO LIQD
ORAL | 0 refills | Status: DC
Start: 2017-02-08 — End: 2018-05-12

## 2017-02-08 MED ORDER — PHENYLEPHRINE-CHLORPHEN-DM 10-4-12.5 MG/5ML PO LIQD: ORAL | 0 refills | Status: DC

## 2017-02-08 NOTE — ED Triage Notes (Signed)
Pt here for ear pain and throat pain x 2 weeks and diarrhea x 1 week.

## 2017-02-08 NOTE — ED Provider Notes (Signed)
CSN: [id number]     Arrival date & time 02/08/17  1003 History   First MD Initiated Contact with Patient 02/08/17 1056     Chief Complaint  Patient presents with  . Otalgia  . Sore Throat  . Diarrhea   (Consider location/radiation/quality/duration/timing/severity/associated sxs/prior Treatment) 66 year old female complaining of a sore throat and earache on the left. She is also complaining of pain in the soft tissues behind the bilateral left angle of the jaw. She has had some chills and PND. Denies shortness of breath. States symptoms have been going on for approximately one week including diarrhea. No fever. Currently vital signs within normal limits.      Past Medical History:  Diagnosis Date  . Asthma   . CAD (coronary artery disease)    a. 12/2014: NSTEMI with cath showing Ostial LAD ~95%, mLAD 80-90%; EF ~40-45%, LAD disease compromised major D1 branch. CABG recommended and performed with LIMA-LAD and SVG-D1.   [name] Kitchen CKD (chronic kidney disease), stage III   . Colitis   . Diabetes mellitus type 2, insulin dependent (HCC)   . Essential hypertension   . Hyperlipidemia with target LDL less than 70 01/28/2014  . Metabolic syndrome: DM, HTN, Obesity as well as HLD 12/18/2014  . NSTEMI (non-ST elevated myocardial infarction) (HCC) 12/18/2014   Echo 1/6: EF 60-65%, no Regional WMA, Gr 1 DD  . Obesity    BMI ~35-36  . 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 Seasonal allergies    Past Surgical History:  Procedure Laterality Date  . ABDOMINAL HYSTERECTOMY    . CORONARY ARTERY BYPASS GRAFT N/A 12/21/2014   Procedure: CORONARY ARTERY BYPASS GRAFTING (CABG) TIMES TWO USING LEFT INTERNAL MAMMARY AND RIGHT SAPHENOUS LEG VEIN HARVESTED ENDOSCOPICALLY;  Surgeon: Loreli Slot, MD;  Location: [facility] OR;  Service: Open Heart Surgery;  Laterality: N/A;  . LEFT HEART CATHETERIZATION WITH CORONARY ANGIOGRAM N/A 12/18/2014   Procedure: LEFT HEART CATHETERIZATION WITH CORONARY ANGIOGRAM;  Surgeon:  Peter M Swaziland, MD;  Location: [facility] CATH LAB;  Service: Cardiovascular;  Laterality: N/A;  . NM MYOCAR PERF WALL MOTION  04/09/2010   Abnormal study - appears to be a small area of apical infarction. Reversible ischemia is not seen.  . TEE WITHOUT CARDIOVERSION N/A 12/21/2014   Procedure: TRANSESOPHAGEAL ECHOCARDIOGRAM (TEE);  Surgeon: Loreli Slot, MD;  Location: [facility] OR;  Service: Open Heart Surgery;  Laterality: N/A;  . TONSILLECTOMY    . US ECHOCARDIOGRAPHY  04/09/2010   Possible small patent foramen ovale suspected;trace MR, mild TR.   Family History  Problem Relation Age of Onset  . Pancreatic cancer Mother   . CAD Mother     Open heart surgery 60s-70s  . Stroke Father   . Heart disease Sister     Unclear details   Social History  Substance Use Topics  . Smoking status: Never Smoker  . Smokeless tobacco: Never Used  . Alcohol use No   OB History    Gravida Para Term Preterm AB Living   5 4 4   1 4    SAB TAB Ectopic Multiple Live Births   1       4     Review of Systems  Constitutional: Positive for activity change. Negative for appetite change, chills, fatigue and fever.  HENT: Positive for congestion, postnasal drip and sore throat. Negative for facial swelling, rhinorrhea and trouble swallowing.   Eyes: Negative.   Respiratory: Negative.  Negative for shortness of breath  and wheezing.   Cardiovascular: Negative.   Gastrointestinal: Positive for diarrhea.  Genitourinary: Negative.   Musculoskeletal: Negative for neck pain and neck stiffness.  Skin: Negative for pallor and rash.  Neurological: Negative.   All other systems reviewed and are negative.   Allergies  Ace inhibitors; Lisinopril; Mushroom extract complex; and Tetanus toxoids  Home Medications   Prior to Admission medications   Medication Sig Start Date End Date Taking? Authorizing Provider  albuterol (PROVENTIL HFA;VENTOLIN HFA) 108 (90 BASE) MCG/ACT inhaler Inhale 2 puffs into the lungs every 6  (six) hours as needed. For shortness of breath or wheezing    Historical Provider, MD  aspirin EC 325 MG EC tablet Take 1 tablet (325 mg total) by mouth daily. 12/27/14   Erin R Barrett, PA-C  benzonatate (TESSALON) 200 MG capsule Take 1 capsule (200 mg total) by mouth 2 (two) times daily as needed for cough. 01/08/16   Charm RingsErin J Honig, MD  colchicine 0.6 MG tablet Take 0.6 mg by mouth 2 (two) times daily as needed (gout).     Historical Provider, MD  Dulaglutide (TRULICITY [place]) Inject into the skin. Pt is unsure of the dosage she's taking. Pt states she will call the office to let us know.    Historical Provider, MD  Fluticasone-Salmeterol (ADVAIR) 100-50 MCG/DOSE AEPB Inhale 1 puff into the lungs 2 (two) times daily.    Historical Provider, MD  glipiZIDE (GLUCOTROL) 10 MG tablet Take 10 mg by mouth 2 (two) times daily before a meal.    Historical Provider, MD  hydrochlorothiazide (HYDRODIURIL) 25 MG tablet Take 25 mg by mouth daily.    Historical Provider, MD  insulin detemir (LEVEMIR) 100 UNIT/ML injection Inject 10 Units into the skin at bedtime.    Historical Provider, MD  linagliptin (TRADJENTA) 5 MG TABS tablet Take 5 mg by mouth daily.    Historical Provider, MD  loratadine (CLARITIN) 10 MG tablet Take 10 mg by mouth daily as needed for allergies.    Historical Provider, MD  metoprolol tartrate (LOPRESSOR) 25 MG tablet Take 1 tablet (25 mg total) by mouth 2 (two) times daily. 02/05/17   Ellsworth LennoxBrittany M Strader, PA  ondansetron (ZOFRAN ODT) 4 MG disintegrating tablet Take 1 tablet (4 mg total) by mouth every 8 (eight) hours as needed for nausea or vomiting. 03/08/15   Osvaldo ShipperGokul Krishnan, MD  oxyCODONE (OXY IR/ROXICODONE) 5 MG immediate release tablet Take 1-2 tablets (5-10 mg total) by mouth every 3 (three) hours as needed for severe pain. 12/27/14   Erin R Barrett, PA-C  Phenylephrine-Chlorphen-DM 09-17-11.5 MG/5ML LIQD Take one half to 1 teaspoon every 4-6 hours as needed for drainage and congestion. May cause  drowsiness. 02/08/17   Hayden Rasmussenavid Dmarius Reeder, NP  simvastatin (ZOCOR) 10 MG tablet Take 10 mg by mouth at bedtime.    Historical Provider, MD  traMADol (ULTRAM) 50 MG tablet Take 1-2 tablets (50-100 mg total) by mouth every 4 (four) hours as needed for moderate pain. 12/27/14   Erin R Barrett, PA-C   Meds Ordered and Administered this Visit  Medications - No data to display  BP 114/67   Pulse 79   Temp 98 F (36.7 C)   Resp 18   SpO2 98%  No data found.   Physical Exam  Constitutional: She is oriented to person, place, and time. She appears well-developed and well-nourished. No distress.  HENT:  Mouth/Throat: No oropharyngeal exudate.  Bilateral TMs with minor retraction. Tenderness to the soft tissue just posterior to  the angle of the jaw left greater than right.  Oropharynx with minor lacy erythema. No exudates or swelling. Mild amount of moderate clear PND.  Eyes: EOM are normal.  Neck: Normal range of motion. Neck supple.  Cardiovascular: Normal rate, regular rhythm and normal heart sounds.   Pulmonary/Chest: Effort normal and breath sounds normal. No respiratory distress. She has no wheezes.  Musculoskeletal: Normal range of motion. She exhibits no edema.  Lymphadenopathy:    She has no cervical adenopathy.  Neurological: She is alert and oriented to person, place, and time.  Skin: Skin is warm and dry. No rash noted.  Psychiatric: She has a normal mood and affect.  Nursing note and vitals reviewed.   Urgent Care Course     Procedures (including critical care time)  Labs Review Labs Reviewed - No data to display  Imaging Review No results found.   Visual Acuity Review  Right Eye Distance:   Left Eye Distance:   Bilateral Distance:    Right Eye Near:   Left Eye Near:    Bilateral Near:         MDM   1. PND (post-nasal drip)   2. ETD (Eustachian tube dysfunction), bilateral   3. Sore throat   4. Diarrhea of presumed infectious origin   5. Otalgia, left     You have drainage in the back of your throat coming down from your sinuses. This in turn causes soreness in the throat and clogging of the eustachian tubes which do not allow the pressure in your ears to equalize. This pressure causes pain and discomfort in your ears and behind her jaw. Take the medication as prescribed but make sure you use a teaspoon and not a tablespoon. Recommend starting off with 1/2 teaspoons at first. This may cause drowsiness. Sure to drink a lot of fluids and pick up some Pedialyte to drink for your diarrhea. He may take one Imodium tablet now and if you still have diarrhea and 45 hours she may take 1 more. Meds ordered this encounter  Medications  . Phenylephrine-Chlorphen-DM 09-17-11.5 MG/5ML LIQD    Sig: Take one half to 1 teaspoon every 4-6 hours as needed for drainage and congestion. May cause drowsiness.    Dispense:  120 mL    Refill:  0    Order Specific Question:   Supervising Provider    Answer:   Linna Hoff [[id number]]       Hayden Rasmussen, NP 02/08/17 1113    Hayden Rasmussen, NP 02/08/17 1115

## 2017-02-08 NOTE — Discharge Instructions (Signed)
You have drainage in the back of your throat coming down from your sinuses. This in turn causes soreness in the throat and clogging of the eustachian tubes which do not allow the pressure in your ears to equalize. This pressure causes pain and discomfort in your ears and behind her jaw. Take the medication as prescribed but make sure you use a teaspoon and not a tablespoon. Recommend starting off with 1/2 teaspoons at first. This may cause drowsiness. Sure to drink a lot of fluids and pick up some Pedialyte to drink for your diarrhea. He may take one Imodium tablet now and if you still have diarrhea and 45 hours she may take 1 more.

## 2017-02-08 NOTE — Telephone Encounter (Signed)
[facility].

## 2017-02-12 DIAGNOSIS — J029 Acute pharyngitis, unspecified: Secondary | ICD-10-CM | POA: Diagnosis not present

## 2017-02-12 DIAGNOSIS — E1321 Other specified diabetes mellitus with diabetic nephropathy: Secondary | ICD-10-CM | POA: Diagnosis not present

## 2017-02-12 DIAGNOSIS — J019 Acute sinusitis, unspecified: Secondary | ICD-10-CM | POA: Diagnosis not present

## 2017-02-12 DIAGNOSIS — I10 Essential (primary) hypertension: Secondary | ICD-10-CM | POA: Diagnosis not present

## 2017-03-09 DIAGNOSIS — J302 Other seasonal allergic rhinitis: Secondary | ICD-10-CM | POA: Diagnosis not present

## 2017-03-09 DIAGNOSIS — I10 Essential (primary) hypertension: Secondary | ICD-10-CM | POA: Diagnosis not present

## 2017-03-09 DIAGNOSIS — E1321 Other specified diabetes mellitus with diabetic nephropathy: Secondary | ICD-10-CM | POA: Diagnosis not present

## 2017-03-09 DIAGNOSIS — M12851 Other specific arthropathies, not elsewhere classified, right hip: Secondary | ICD-10-CM | POA: Diagnosis not present

## 2017-04-20 DIAGNOSIS — E1321 Other specified diabetes mellitus with diabetic nephropathy: Secondary | ICD-10-CM | POA: Diagnosis not present

## 2017-04-20 DIAGNOSIS — K219 Gastro-esophageal reflux disease without esophagitis: Secondary | ICD-10-CM | POA: Diagnosis not present

## 2017-04-20 DIAGNOSIS — I2510 Atherosclerotic heart disease of native coronary artery without angina pectoris: Secondary | ICD-10-CM | POA: Diagnosis not present

## 2017-04-20 DIAGNOSIS — I10 Essential (primary) hypertension: Secondary | ICD-10-CM | POA: Diagnosis not present

## 2017-05-07 DIAGNOSIS — J019 Acute sinusitis, unspecified: Secondary | ICD-10-CM | POA: Diagnosis not present

## 2017-05-07 DIAGNOSIS — I10 Essential (primary) hypertension: Secondary | ICD-10-CM | POA: Diagnosis not present

## 2017-05-07 DIAGNOSIS — E1321 Other specified diabetes mellitus with diabetic nephropathy: Secondary | ICD-10-CM | POA: Diagnosis not present

## 2017-05-27 ENCOUNTER — Other Ambulatory Visit: Payer: Self-pay | Admitting: Cardiology

## 2017-06-01 DIAGNOSIS — I10 Essential (primary) hypertension: Secondary | ICD-10-CM | POA: Diagnosis not present

## 2017-06-01 DIAGNOSIS — E1321 Other specified diabetes mellitus with diabetic nephropathy: Secondary | ICD-10-CM | POA: Diagnosis not present

## 2017-06-01 DIAGNOSIS — I2510 Atherosclerotic heart disease of native coronary artery without angina pectoris: Secondary | ICD-10-CM | POA: Diagnosis not present

## 2017-06-01 DIAGNOSIS — J4520 Mild intermittent asthma, uncomplicated: Secondary | ICD-10-CM | POA: Diagnosis not present

## 2017-06-09 DIAGNOSIS — E1321 Other specified diabetes mellitus with diabetic nephropathy: Secondary | ICD-10-CM | POA: Diagnosis not present

## 2017-06-09 DIAGNOSIS — I10 Essential (primary) hypertension: Secondary | ICD-10-CM | POA: Diagnosis not present

## 2017-06-09 DIAGNOSIS — Z111 Encounter for screening for respiratory tuberculosis: Secondary | ICD-10-CM | POA: Diagnosis not present

## 2017-06-14 ENCOUNTER — Encounter: Payer: Self-pay | Admitting: *Deleted

## 2017-06-14 ENCOUNTER — Ambulatory Visit: Payer: Commercial Managed Care - HMO | Admitting: Cardiology

## 2017-07-13 DIAGNOSIS — J4520 Mild intermittent asthma, uncomplicated: Secondary | ICD-10-CM | POA: Diagnosis not present

## 2017-07-13 DIAGNOSIS — I2510 Atherosclerotic heart disease of native coronary artery without angina pectoris: Secondary | ICD-10-CM | POA: Diagnosis not present

## 2017-07-13 DIAGNOSIS — I10 Essential (primary) hypertension: Secondary | ICD-10-CM | POA: Diagnosis not present

## 2017-07-13 DIAGNOSIS — E1321 Other specified diabetes mellitus with diabetic nephropathy: Secondary | ICD-10-CM | POA: Diagnosis not present

## 2017-08-16 ENCOUNTER — Encounter (HOSPITAL_COMMUNITY): Payer: Self-pay | Admitting: Emergency Medicine

## 2017-08-16 ENCOUNTER — Ambulatory Visit (HOSPITAL_COMMUNITY)
Admission: EM | Admit: 2017-08-16 | Discharge: 2017-08-16 | Disposition: A | Discharge status: Home / Self Care | Payer: Medicare HMO | Attending: Physician Assistant | Admitting: Physician Assistant

## 2017-08-16 DIAGNOSIS — J0140 Acute pansinusitis, unspecified: Secondary | ICD-10-CM

## 2017-08-16 MED ORDER — DOXYCYCLINE HYCLATE 100 MG PO CAPS: 100.0000 mg | ORAL_CAPSULE | Freq: Two times a day (BID) | ORAL | 0 refills | Status: AC

## 2017-08-16 NOTE — Discharge Instructions (Signed)
Start Doxycycline for sinus infection. Continue flonase, zyrtec for nasal congestion. You can use over the counter nasal saline rinse such as neti pot for nasal congestion. Keep hydrated, your urine should be clear to pale yellow in color. Tylenol/motrin for fever and pain. Monitor for any worsening of symptoms, chest pain, shortness of breath, wheezing, swelling of the throat, follow up for reevaluation.

## 2017-08-16 NOTE — ED Provider Notes (Signed)
[facility]    CSN: [id number] Arrival date & time: 08/16/17  1029     History   Chief Complaint Chief Complaint  Patient presents with  . Sore Throat    HPI Brandelyn Henne is a 66 y.o. female.   66 year old female with history of asthma, COPD, diabetes, seasonal allergy comes in for 2 week history of sore throat. She denies any URI symptoms such as fever, cough, nasal congestion, rhinorrhea. She has been trying salt water gurgles, Tylenol without relief. Denies ear pain, eye pain. Denies chest pain, shortness of breath, wheezing. She has been feeling weak. She has been taking Zyrtec and Flonase for seasonal allergies. Diabetes is stable, but above goal, last A1c 8.      Past Medical History:  Diagnosis Date  . Asthma   . CAD (coronary artery disease)    a. 12/2014: NSTEMI with cath showing Ostial LAD ~95%, mLAD 80-90%; EF ~40-45%, LAD disease compromised major D1 branch. CABG recommended and performed with LIMA-LAD and SVG-D1.   [name] Kitchen CKD (chronic kidney disease), stage III   . Colitis   . Diabetes mellitus type 2, insulin dependent (HCC)   . Essential hypertension   . Hyperlipidemia with target LDL less than 70 01/28/2014  . Metabolic syndrome: DM, HTN, Obesity as well as HLD 12/18/2014  . NSTEMI (non-ST elevated myocardial infarction) (HCC) 12/18/2014   Echo 1/6: EF 60-65%, no Regional WMA, Gr 1 DD  . Obesity    BMI ~35-36  . 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 Seasonal allergies     Patient Active Problem List   Diagnosis Date Noted  . Colitis, acute 03/05/2015  . Colitis 03/05/2015  . Lower abdominal pain   . 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 Ostial LAD disease. 12/19/2014  . NSTEMI (non-ST elevated myocardial infarction) (HCC) 12/18/2014  . Obesity, Class II, BMI 35-39.9 12/18/2014  . Metabolic syndrome: DM, HTN, Obesity as well as HLD 12/18/2014  . Frequency of urination 07/17/2014  . Nocturia more than twice per night 07/17/2014  . Abdominal pain  01/28/2014  . Asthma, chronic 01/28/2014  . Hyperlipidemia with target LDL less than 70 01/28/2014  . Leukocytosis, unspecified 01/28/2014  . Normocytic anemia 01/18/2013  . 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 Diabetes mellitus type 2, uncontrolled (HCC) 01/16/2013  . Seasonal allergies     Past Surgical History:  Procedure Laterality Date  . ABDOMINAL HYSTERECTOMY    . CORONARY ARTERY BYPASS GRAFT N/A 12/21/2014   Procedure: CORONARY ARTERY BYPASS GRAFTING (CABG) TIMES TWO USING LEFT INTERNAL MAMMARY AND RIGHT SAPHENOUS LEG VEIN HARVESTED ENDOSCOPICALLY;  Surgeon: Loreli Slot, MD;  Location: [facility] OR;  Service: Open Heart Surgery;  Laterality: N/A;  . LEFT HEART CATHETERIZATION WITH CORONARY ANGIOGRAM N/A 12/18/2014   Procedure: LEFT HEART CATHETERIZATION WITH CORONARY ANGIOGRAM;  Surgeon: Peter M Swaziland, MD;  Location: [facility] CATH LAB;  Service: Cardiovascular;  Laterality: N/A;  . NM MYOCAR PERF WALL MOTION  04/09/2010   Abnormal study - appears to be a small area of apical infarction. Reversible ischemia is not seen.  . TEE WITHOUT CARDIOVERSION N/A 12/21/2014   Procedure: TRANSESOPHAGEAL ECHOCARDIOGRAM (TEE);  Surgeon: Loreli Slot, MD;  Location: [facility] OR;  Service: Open Heart Surgery;  Laterality: N/A;  . TONSILLECTOMY    . US ECHOCARDIOGRAPHY  04/09/2010   Possible small patent foramen ovale suspected;trace MR, mild TR.    OB History  Gravida Para Term Preterm AB Living   5 4 4   1 4    SAB TAB Ectopic Multiple Live Births   1       4       Home Medications    Prior to Admission medications   Medication Sig Start Date End Date Taking? Authorizing Provider  albuterol (PROVENTIL HFA;VENTOLIN HFA) 108 (90 BASE) MCG/ACT inhaler Inhale 2 puffs into the lungs every 6 (six) hours as needed. For shortness of breath or wheezing    [provider]  aspirin EC 325 MG EC tablet Take 1 tablet (325  mg total) by mouth daily. 12/27/14   Barrett, Erin R, PA-C  colchicine 0.6 MG tablet Take 0.6 mg by mouth 2 (two) times daily as needed (gout).     [provider]  doxycycline (VIBRAMYCIN) 100 MG capsule Take 1 capsule (100 mg total) by mouth 2 (two) times daily. 08/16/17 08/23/17  Cathie HoopsYu, Seniyah Esker V, PA-C  Dulaglutide (TRULICITY [place]) Inject into the skin. Pt is unsure of the dosage she's taking. Pt states she will call the office to let us know.    [provider]  Fluticasone-Salmeterol (ADVAIR) 100-50 MCG/DOSE AEPB Inhale 1 puff into the lungs 2 (two) times daily.    [provider]  glipiZIDE (GLUCOTROL) 10 MG tablet Take 10 mg by mouth 2 (two) times daily before a meal.    [provider]  hydrochlorothiazide (HYDRODIURIL) 25 MG tablet Take 25 mg by mouth daily.    [provider]  insulin detemir (LEVEMIR) 100 UNIT/ML injection Inject 10 Units into the skin at bedtime.    [provider]  linagliptin (TRADJENTA) 5 MG TABS tablet Take 5 mg by mouth daily.    [provider]  loratadine (CLARITIN) 10 MG tablet Take 10 mg by mouth daily as needed for allergies.    [provider]  metoprolol tartrate (LOPRESSOR) 25 MG tablet TAKE 1 TABLET TWICE DAILY 05/27/17   Marykay LexHarding, David W, MD  ondansetron (ZOFRAN ODT) 4 MG disintegrating tablet Take 1 tablet (4 mg total) by mouth every 8 (eight) hours as needed for nausea or vomiting. 03/08/15   Osvaldo ShipperKrishnan, Gokul, MD  oxyCODONE (OXY IR/ROXICODONE) 5 MG immediate release tablet Take 1-2 tablets (5-10 mg total) by mouth every 3 (three) hours as needed for severe pain. 12/27/14   Barrett, Rae RoamErin R, PA-C  Phenylephrine-Chlorphen-DM 09-17-11.5 MG/5ML LIQD Take one half to 1 teaspoon every 4-6 hours as needed for drainage and congestion. May cause drowsiness. 02/08/17   Hayden RasmussenMabe, David, NP  simvastatin (ZOCOR) 10 MG tablet Take 10 mg by mouth at bedtime.    [provider]  traMADol (ULTRAM) 50 MG tablet Take  1-2 tablets (50-100 mg total) by mouth every 4 (four) hours as needed for moderate pain. 12/27/14   Barrett, Rae RoamErin R, PA-C    Family History Family History  Problem Relation Age of Onset  . Pancreatic cancer Mother   . CAD Mother        Open heart surgery 60s-70s  . Stroke Father   . Heart disease Sister        Unclear details    Social History Social History  Substance Use Topics  . Smoking status: Never Smoker  . Smokeless tobacco: Never Used  . Alcohol use No     Allergies   Ace inhibitors; Lisinopril; Mushroom extract complex; and Tetanus toxoids   Review of Systems Review of Systems  Reason unable to perform ROS: See  HPI as above.     Physical Exam Triage Vital Signs ED Triage Vitals  Enc Vitals Group     BP 08/16/17 1127 115/64     Pulse Rate 08/16/17 1127 73     Resp 08/16/17 1127 20     Temp 08/16/17 1127 97.7 F (36.5 C)     Temp Source 08/16/17 1127 Oral     SpO2 08/16/17 1127 98 %     Weight --      Height --      Head Circumference --      Peak Flow --      Pain Score 08/16/17 1125 7     Pain Loc --      Pain Edu? --      Excl. in GC? --    No data found.   Updated Vital Signs BP 115/64 (BP Location: Right Arm) Comment: large cuff  Pulse 73   Temp 97.7 F (36.5 C) (Oral)   Resp 20   SpO2 98%   Physical Exam  Constitutional: She is oriented to person, place, and time. She appears well-developed and well-nourished. No distress.  HENT:  Head: Normocephalic and atraumatic.  Right Ear: Tympanic membrane, external ear and ear canal normal. Tympanic membrane is not erythematous and not bulging.  Left Ear: Tympanic membrane, external ear and ear canal normal. Tympanic membrane is not erythematous and not bulging.  Nose: Mucosal edema present. Right sinus exhibits maxillary sinus tenderness and frontal sinus tenderness. Left sinus exhibits maxillary sinus tenderness and frontal sinus tenderness.  Mouth/Throat: Uvula is midline and mucous  membranes are normal. Posterior oropharyngeal erythema present.  Eyes: Pupils are equal, round, and reactive to light. Conjunctivae are normal.  Neck: Normal range of motion. Neck supple.  Cardiovascular: Normal rate, regular rhythm and normal heart sounds.  Exam reveals no gallop and no friction rub.   No murmur heard. Pulmonary/Chest: Effort normal and breath sounds normal. She has no decreased breath sounds. She has no wheezes. She has no rhonchi. She has no rales.  Lymphadenopathy:    She has no cervical adenopathy.  Neurological: She is alert and oriented to person, place, and time.  Skin: Skin is warm and dry.  Psychiatric: She has a normal mood and affect. Her behavior is normal. Judgment normal.     UC Treatments / Results  Labs (all labs ordered are listed, but only abnormal results are displayed) Labs Reviewed - No data to display  EKG  EKG Interpretation None       Radiology No results found.  Procedures Procedures (including critical care time)  Medications Ordered in UC Medications - No data to display   Initial Impression / Assessment and Plan / UC Course  I have reviewed the triage vital signs and the nursing notes.  Pertinent labs & imaging results that were available during my care of the patient were reviewed by me and considered in my medical decision making (see chart for details).    History and exam most consistent with sinusitis, given 2 week history, will treat with antibiotics. Patient states was unable to tolerate Augmentin in the past due to diarrhea. Will start doxycycline instead. Continue symptomatic treatment. Return precautions given.  Final Clinical Impressions(s) / UC Diagnoses   Final diagnoses:  Acute non-recurrent pansinusitis    New Prescriptions New Prescriptions   DOXYCYCLINE (VIBRAMYCIN) 100 MG CAPSULE    Take 1 capsule (100 mg total) by mouth 2 (two) times daily.  Belinda Fisher, PA-C 08/16/17 [phone]

## 2017-08-16 NOTE — ED Triage Notes (Signed)
THROAT IS BURNING AND HURTING, ONSET 2 WEEKS AGO.  DENIES COUGH OR RUNNY NOSE.

## 2017-08-24 DIAGNOSIS — N183 Chronic kidney disease, stage 3 (moderate): Secondary | ICD-10-CM | POA: Diagnosis not present

## 2017-08-24 DIAGNOSIS — E1321 Other specified diabetes mellitus with diabetic nephropathy: Secondary | ICD-10-CM | POA: Diagnosis not present

## 2017-08-24 DIAGNOSIS — E784 Other hyperlipidemia: Secondary | ICD-10-CM | POA: Diagnosis not present

## 2017-08-24 DIAGNOSIS — I2510 Atherosclerotic heart disease of native coronary artery without angina pectoris: Secondary | ICD-10-CM | POA: Diagnosis not present

## 2017-08-24 DIAGNOSIS — I10 Essential (primary) hypertension: Secondary | ICD-10-CM | POA: Diagnosis not present

## 2017-10-05 DIAGNOSIS — E119 Type 2 diabetes mellitus without complications: Secondary | ICD-10-CM | POA: Diagnosis not present

## 2017-10-05 DIAGNOSIS — Z23 Encounter for immunization: Secondary | ICD-10-CM | POA: Diagnosis not present

## 2017-10-05 DIAGNOSIS — N183 Chronic kidney disease, stage 3 (moderate): Secondary | ICD-10-CM | POA: Diagnosis not present

## 2017-10-05 DIAGNOSIS — J4520 Mild intermittent asthma, uncomplicated: Secondary | ICD-10-CM | POA: Diagnosis not present

## 2017-10-05 DIAGNOSIS — I10 Essential (primary) hypertension: Secondary | ICD-10-CM | POA: Diagnosis not present

## 2017-10-20 ENCOUNTER — Encounter (INDEPENDENT_AMBULATORY_CARE_PROVIDER_SITE_OTHER): Payer: Self-pay

## 2017-10-20 ENCOUNTER — Ambulatory Visit (INDEPENDENT_AMBULATORY_CARE_PROVIDER_SITE_OTHER): Payer: Medicare HMO | Admitting: Cardiology

## 2017-10-20 ENCOUNTER — Encounter: Payer: Self-pay | Admitting: Cardiology

## 2017-10-20 VITALS — BP 132/84 | HR 71 | Ht 62.0 in | Wt 202.4 lb

## 2017-10-20 DIAGNOSIS — E8881 Metabolic syndrome: Secondary | ICD-10-CM

## 2017-10-20 DIAGNOSIS — E785 Hyperlipidemia, unspecified: Secondary | ICD-10-CM | POA: Diagnosis not present

## 2017-10-20 DIAGNOSIS — I25119 Atherosclerotic heart disease of native coronary artery with unspecified angina pectoris: Secondary | ICD-10-CM

## 2017-10-20 DIAGNOSIS — I214 Non-ST elevation (NSTEMI) myocardial infarction: Secondary | ICD-10-CM

## 2017-10-20 DIAGNOSIS — E669 Obesity, unspecified: Secondary | ICD-10-CM | POA: Diagnosis not present

## 2017-10-20 NOTE — Assessment & Plan Note (Signed)
Labs were checked by PCP.  I do not have recent labs.  She is only on low-dose simvastatin.  Target LDL is less than 70, &  we need to ensure that she is

## 2017-10-20 NOTE — Progress Notes (Signed)
PCP: Fleet ContrasAvbuere, Edwin, MD  Clinic Note: Chief Complaint  Patient presents with  . Follow-up  . Coronary Artery Disease    CABG    HPI: Cheryl HasteGlenda Gill is a 66 y.o. female with a PMH ( pst LAD CAD- s/p CABG in 12/2014 with LIMA-LAD and SVG-D1, HTN, HLD, Type 2 DM, and Stage 3 CKD who presents today for delayed 4675-month follow-up for CAD-CABG  January 2016: Admitted for  Non-STEMI ? cardiac catheterization revealed severe disease in the proximal LAD that compromised the major diagonal branch. Based on the location of the stenosis and extensive disease --> CABG.  ? CABGx2: LIMA to LAD and SVG-D 1.  ? Well preserved EF by echo at the time of grade 1 diastolic function.   Cheryl HasteGlenda Deiss was last seen on February 05, 2017 by Randall AnBrittany Strader, [place].  I last saw her in July 2017.  She was doing well but unable to exercise secondary to gout pain.  Otherwise doing relatively well with no major complaints.  Recent Hospitalizations:   ER for sinusitis - Sept 3.   Studies Personally Reviewed - (if available, images/films reviewed: From Epic Chart or Care Everywhere)  Cheryl Gill returns today actually doing quite well.  None  Interval History: She can walk over a mile without any major issues.  No chest tightness or pressure with rest or exertion.  Her  gout pain seems to have resolved.  She has not really noted any significant exertional dyspnea.  No resting chest discomfort or dyspnea.  No PND, orthopnea or edema.  She denies any rapid irregular heartbeat or palpitations, syncope/near syncope or TIA/amaurosis fugax  No melena, hematochezia, hematuria, or epstaxis. No claudication.  ROS: A comprehensive was performed. Review of Systems  Constitutional: Negative for malaise/fatigue.  HENT: Negative for nosebleeds.   Respiratory: Negative for cough, shortness of breath and wheezing.   Cardiovascular: Negative for claudication and leg swelling.  Gastrointestinal: Negative for abdominal pain,  blood in stool, constipation and melena.  Genitourinary: Negative for dysuria, frequency and urgency.  Musculoskeletal: Positive for joint pain (Knee and ankle pain from arthritis). Negative for falls and myalgias.       Occasional cramping  Neurological: Negative for dizziness, weakness and headaches.  Psychiatric/Behavioral: Negative for memory loss. The patient is not nervous/anxious and does not have insomnia.   All other systems reviewed and are negative.    I have reviewed and (if needed) personally updated the patient's problem list, medications, allergies, past medical and surgical history, social and family history.   Past Medical History:  Diagnosis Date  . Asthma   . CAD (coronary artery disease)    a. 12/2014: NSTEMI with cath showing Ostial LAD ~95%, mLAD 80-90%; EF ~40-45%, LAD disease compromised major D1 branch. CABG recommended and performed with LIMA-LAD and SVG-D1.   [name] Kitchen. CKD (chronic kidney disease), stage III (HCC)   . Colitis   . Diabetes mellitus type 2, insulin dependent (HCC)   . Essential hypertension   . Hyperlipidemia with target LDL less than 70 01/28/2014  . Metabolic syndrome: DM, HTN, Obesity as well as HLD 12/18/2014  . NSTEMI (non-ST elevated myocardial infarction) (HCC) 12/18/2014   Echo 1/6: EF 60-65%, no Regional WMA, Gr 1 DD  . Obesity    BMI ~35-36  . 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 Seasonal allergies     Past Surgical History:  Procedure Laterality Date  . ABDOMINAL HYSTERECTOMY    . NM MYOCAR PERF WALL  MOTION  04/09/2010   Abnormal study - appears to be a small area of apical infarction. Reversible ischemia is not seen.  . TONSILLECTOMY    . US ECHOCARDIOGRAPHY  04/09/2010   Possible small patent foramen ovale suspected;trace MR, mild TR.    Echo 12/2014: LVEF 60-65%, normal wall thickness, normal wall motion, diastolic dysfunction, indeterminate LV filling pressure, normal LA size.  Current Meds  Medication Sig  . albuterol  (PROVENTIL HFA;VENTOLIN HFA) 108 (90 BASE) MCG/ACT inhaler Inhale 2 puffs into the lungs every 6 (six) hours as needed. For shortness of breath or wheezing  . aspirin EC 325 MG EC tablet Take 1 tablet (325 mg total) by mouth daily.  . colchicine 0.6 MG tablet Take 0.6 mg by mouth 2 (two) times daily as needed (gout).   . Dulaglutide (TRULICITY [place]) Inject into the skin. Pt is unsure of the dosage she's taking. Pt states she will call the office to let us know.  . Fluticasone-Salmeterol (ADVAIR) 100-50 MCG/DOSE AEPB Inhale 1 puff into the lungs 2 (two) times daily.  [name] Kitchen. glipiZIDE (GLUCOTROL) 10 MG tablet Take 10 mg by mouth 2 (two) times daily before a meal.  . hydrochlorothiazide (HYDRODIURIL) 25 MG tablet Take 25 mg by mouth daily.  . insulin detemir (LEVEMIR) 100 UNIT/ML injection Inject 10 Units into the skin at bedtime.  [name] Kitchen. linagliptin (TRADJENTA) 5 MG TABS tablet Take 5 mg by mouth daily.  [name] Kitchen. loratadine (CLARITIN) 10 MG tablet Take 10 mg by mouth daily as needed for allergies.  . metoprolol tartrate (LOPRESSOR) 25 MG tablet TAKE 1 TABLET TWICE DAILY  . ondansetron (ZOFRAN ODT) 4 MG disintegrating tablet Take 1 tablet (4 mg total) by mouth every 8 (eight) hours as needed for nausea or vomiting.  [name] Kitchen. oxyCODONE (OXY IR/ROXICODONE) 5 MG immediate release tablet Take 1-2 tablets (5-10 mg total) by mouth every 3 (three) hours as needed for severe pain.  [name] Kitchen. Phenylephrine-Chlorphen-DM 09-17-11.5 MG/5ML LIQD Take one half to 1 teaspoon every 4-6 hours as needed for drainage and congestion. May cause drowsiness.  . simvastatin (ZOCOR) 10 MG tablet Take 10 mg by mouth at bedtime.  . traMADol (ULTRAM) 50 MG tablet Take 1-2 tablets (50-100 mg total) by mouth every 4 (four) hours as needed for moderate pain.    Allergies  Allergen Reactions  . Ace Inhibitors Anaphylaxis  . Lisinopril Anaphylaxis  . Mushroom Extract Complex Swelling  . Tetanus Toxoids Nausea And Vomiting and Swelling    Social History    Socioeconomic History  . Marital status: Single    Spouse name: None  . Number of children: None  . Years of education: None  . Highest education level: None  Social Needs  . Financial resource strain: None  . Food insecurity - worry: None  . Food insecurity - inability: None  . Transportation needs - medical: None  . Transportation needs - non-medical: None  Occupational History    Comment: Retired Midwifebus driver  Tobacco Use  . Smoking status: Never Smoker  . Smokeless tobacco: Never Used  Substance and Sexual Activity  . Alcohol use: No    Alcohol/week: 0.0 oz  . Drug use: No  . Sexual activity: Not Currently    Birth control/protection: Surgical  Other Topics Concern  . None  Social History Narrative  . None    family history includes CAD in her mother; Heart disease in her sister; Pancreatic cancer in her mother; Stroke in her father.  Wt Readings from Last 3  Encounters:  10/20/17 202 lb 6.4 oz (91.8 kg)  02/05/17 198 lb (89.8 kg)  07/02/16 199 lb 12.8 oz (90.6 kg)    PHYSICAL EXAM BP 132/84   Pulse 71   Ht 5\' 2"  (1.575 m)   Wt 202 lb 6.4 oz (91.8 kg)   BMI 37.02 kg/m  Physical Exam  Constitutional: She is oriented to person, place, and time. She appears well-developed and well-nourished. No distress.  HENT:  Head: Normocephalic and atraumatic.  Eyes: EOM are normal.  Neck: Normal range of motion. Neck supple. No JVD present. No thyromegaly present.  Cardiovascular: Normal rate, regular rhythm, normal heart sounds and intact distal pulses. Exam reveals no gallop and no friction rub.  No murmur heard. Pulmonary/Chest: Effort normal and breath sounds normal. No respiratory distress. She has no wheezes. She has no rales.  Abdominal: Soft. Bowel sounds are normal. She exhibits no distension. There is no tenderness. There is no rebound.  Musculoskeletal: Normal range of motion. She exhibits no edema or deformity.  Neurological: She is alert and oriented to  person, place, and time.  Skin: Skin is warm and dry. No rash noted. No erythema.  Psychiatric: She has a normal mood and affect. Her behavior is normal. Judgment and thought content normal.  Nursing note and vitals reviewed.   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  The 2 grafts cover the diagonal and LAD.  No further anginal symptoms.  Sh is on aspirin beta blocker and statin.  No signs or symptoms to suggest recurrent angina.  He is almost 2 years post CABG.  In the absence of symptoms I would wait another year or 2 before doing a stress test for screening. Okay to reduce aspirin dose to 81      Relevant Orders   EKG 12-Lead   Hyperlipidemia with target LDL less than 70 (Chronic)    Labs were checked by PCP.  I do not have recent labs.  She is only on low-dose simvastatin.  Target LDL is less than 70, &  we need to ensure that she is      Metabolic syndrome: DM, HTN, Obesity as well as HLD (Chronic)   Relevant Orders   EKG 12-Lead   NSTEMI (non-ST elevated myocardial infarction) (HCC) (Chronic)    Thankfully, she has been normal EF by echo with no regional wall motion abnormalities. On stable regimen for now.      Obesity, Class II, BMI 35-39.9 (Chronic)    She seems to be at her baseline weight now.  I recommend she continue to stay active with exercise.         Current medicines are reviewed at length with the patient today. (+/- concerns) none The following changes have been made:None  Patient Instructions   NO CHANGE WITH CURRENT MEDICATIONS    WILL OBTAIN LABS FROM PRIMARY OFFICE.    Your physician wants you to follow-up in June 2019 WITH DR [facility].You will receive a reminder letter in the mail two months in advance. If  you don't receive a letter, please call our office to schedule the follow-up appointment.   If you need a refill on your cardiac medications before your next appointment, please call your pharmacy.    Studies Ordered:   Orders Placed This Encounter  Procedures  . EKG 12-Lead      Bryan Lemma, M.D., M.S. Interventional Cardiologist   Pager # [phone] Phone # [phone] [address] [place], [place] [address]

## 2017-10-20 NOTE — Patient Instructions (Signed)
NO CHANGE WITH CURRENT MEDICATIONS    WILL OBTAIN LABS FROM PRIMARY OFFICE.    Your physician [facility] you to [facility] in June 2019 [facility].You will receive a reminder letter in the mail two months in advance. If you don't receive a letter, please call our office to schedule the follow-up appointment.   If you need a refill on your cardiac medications before your next appointment, please call your pharmacy.

## 2017-10-20 NOTE — Assessment & Plan Note (Signed)
Thankfully, she has been normal EF by echo with no regional wall motion abnormalities. On stable regimen for now.

## 2017-10-20 NOTE — Assessment & Plan Note (Addendum)
Doing wonderfully well after her CABG for ostial and mid LAD disease.  The 2 grafts cover the diagonal and LAD.  No further anginal symptoms.  Sh is on aspirin beta blocker and statin.  No signs or symptoms to suggest recurrent angina.  He is almost 2 years post CABG.  In the absence of symptoms I would wait another year or 2 before doing a stress test for screening. Okay to reduce aspirin dose to 81

## 2017-10-20 NOTE — Assessment & Plan Note (Signed)
She seems to be at her baseline weight now.  I recommend she continue to stay active with exercise.

## 2017-11-05 ENCOUNTER — Encounter (HOSPITAL_COMMUNITY): Payer: Self-pay | Admitting: Emergency Medicine

## 2017-11-05 ENCOUNTER — Ambulatory Visit (HOSPITAL_COMMUNITY)
Admission: EM | Admit: 2017-11-05 | Discharge: 2017-11-05 | Disposition: A | Discharge status: Home / Self Care | Payer: Medicare HMO | Attending: Internal Medicine | Admitting: Internal Medicine

## 2017-11-05 DIAGNOSIS — Z951 Presence of aortocoronary bypass graft: Secondary | ICD-10-CM | POA: Insufficient documentation

## 2017-11-05 DIAGNOSIS — E1122 Type 2 diabetes mellitus with diabetic chronic kidney disease: Secondary | ICD-10-CM | POA: Diagnosis not present

## 2017-11-05 DIAGNOSIS — Z800 Family history of malignant neoplasm of digestive organs: Secondary | ICD-10-CM | POA: Insufficient documentation

## 2017-11-05 DIAGNOSIS — Z794 Long term (current) use of insulin: Secondary | ICD-10-CM | POA: Insufficient documentation

## 2017-11-05 DIAGNOSIS — Z7951 Long term (current) use of inhaled steroids: Secondary | ICD-10-CM | POA: Insufficient documentation

## 2017-11-05 DIAGNOSIS — R0982 Postnasal drip: Secondary | ICD-10-CM | POA: Diagnosis not present

## 2017-11-05 DIAGNOSIS — J45909 Unspecified asthma, uncomplicated: Secondary | ICD-10-CM | POA: Diagnosis not present

## 2017-11-05 DIAGNOSIS — E119 Type 2 diabetes mellitus without complications: Secondary | ICD-10-CM | POA: Diagnosis not present

## 2017-11-05 DIAGNOSIS — I252 Old myocardial infarction: Secondary | ICD-10-CM | POA: Diagnosis not present

## 2017-11-05 DIAGNOSIS — J029 Acute pharyngitis, unspecified: Secondary | ICD-10-CM

## 2017-11-05 DIAGNOSIS — Z887 Allergy status to serum and vaccine status: Secondary | ICD-10-CM | POA: Insufficient documentation

## 2017-11-05 DIAGNOSIS — Z888 Allergy status to other drugs, medicaments and biological substances status: Secondary | ICD-10-CM | POA: Insufficient documentation

## 2017-11-05 DIAGNOSIS — Z823 Family history of stroke: Secondary | ICD-10-CM | POA: Insufficient documentation

## 2017-11-05 DIAGNOSIS — N183 Chronic kidney disease, stage 3 (moderate): Secondary | ICD-10-CM | POA: Insufficient documentation

## 2017-11-05 DIAGNOSIS — Z8249 Family history of ischemic heart disease and other diseases of the circulatory system: Secondary | ICD-10-CM | POA: Insufficient documentation

## 2017-11-05 DIAGNOSIS — Z79899 Other long term (current) drug therapy: Secondary | ICD-10-CM | POA: Diagnosis not present

## 2017-11-05 DIAGNOSIS — E785 Hyperlipidemia, unspecified: Secondary | ICD-10-CM | POA: Diagnosis not present

## 2017-11-05 DIAGNOSIS — Z90710 Acquired absence of both cervix and uterus: Secondary | ICD-10-CM | POA: Diagnosis not present

## 2017-11-05 DIAGNOSIS — Z7982 Long term (current) use of aspirin: Secondary | ICD-10-CM | POA: Diagnosis not present

## 2017-11-05 DIAGNOSIS — I129 Hypertensive chronic kidney disease with stage 1 through stage 4 chronic kidney disease, or unspecified chronic kidney disease: Secondary | ICD-10-CM | POA: Diagnosis not present

## 2017-11-05 LAB — POCT RAPID STREP A: Streptococcus, Group A Screen (Direct): NEGATIVE

## 2017-11-05 NOTE — ED Provider Notes (Signed)
[facility]    CSN: [id number] Arrival date & time: 11/05/17  1722     History   Chief Complaint Chief Complaint  Patient presents with  . Sore Throat    HPI Cheryl Gill is a 66 y.o. female.   66 year old female complaining of a sore throat for one week. Mild bilateral earaches. She has more of a burning in her throat. Denies fever or chills. Her vital signs are normal. Denies chest pain, shortness of breath, rash, GU or GI symptoms.      Past Medical History:  Diagnosis Date  . Asthma   . CAD (coronary artery disease)    a. 12/2014: NSTEMI with cath showing Ostial LAD ~95%, mLAD 80-90%; EF ~40-45%, LAD disease compromised major D1 branch. CABG recommended and performed with LIMA-LAD and SVG-D1.   [name] Kitchen CKD (chronic kidney disease), stage III (HCC)   . Colitis   . Diabetes mellitus type 2, insulin dependent (HCC)   . Essential hypertension   . Hyperlipidemia with target LDL less than 70 01/28/2014  . Metabolic syndrome: DM, HTN, Obesity as well as HLD 12/18/2014  . NSTEMI (non-ST elevated myocardial infarction) (HCC) 12/18/2014   Echo 1/6: EF 60-65%, no Regional WMA, Gr 1 DD  . Obesity    BMI ~35-36  . 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 Seasonal allergies     Patient Active Problem List   Diagnosis Date Noted  . Colitis, acute 03/05/2015  . Colitis 03/05/2015  . Lower abdominal pain   . 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 Atherosclerotic heart disease of native coronary artery with angina pectoris (HCC) 12/19/2014  . NSTEMI (non-ST elevated myocardial infarction) (HCC) 12/18/2014  . Obesity, Class II, BMI 35-39.9 12/18/2014  . Metabolic syndrome: DM, HTN, Obesity as well as HLD 12/18/2014  . Frequency of urination 07/17/2014  . Nocturia more than twice per night 07/17/2014  . Abdominal pain 01/28/2014  . Asthma, chronic 01/28/2014  . Hyperlipidemia with target LDL less than 70 01/28/2014  . Leukocytosis, unspecified 01/28/2014  . Normocytic anemia  01/18/2013  . 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 Diabetes mellitus type 2, uncontrolled (HCC) 01/16/2013  . Seasonal allergies     Past Surgical History:  Procedure Laterality Date  . ABDOMINAL HYSTERECTOMY    . CORONARY ARTERY BYPASS GRAFT N/A 12/21/2014   Procedure: CORONARY ARTERY BYPASS GRAFTING (CABG) TIMES TWO USING LEFT INTERNAL MAMMARY AND RIGHT SAPHENOUS LEG VEIN HARVESTED ENDOSCOPICALLY;  Surgeon: Loreli Slot, MD;  Location: [facility] OR;  Service: Open Heart Surgery;  Laterality: N/A;  . LEFT HEART CATHETERIZATION WITH CORONARY ANGIOGRAM N/A 12/18/2014   Procedure: LEFT HEART CATHETERIZATION WITH CORONARY ANGIOGRAM;  Surgeon: Peter M Swaziland, MD;  Location[facility] C[facility] LAB;  Service: Cardiovascular;  Laterality: N/A;  . NM MYOCAR PERF WALL MOTION  04/09/2010   Abnormal study - appears to be a small area of apical infarction. Reversible ischemia is not seen.  . TEE WITHOUT CARDIOVERSION N/A 12/21/2014   Procedure: TRANSESOPHAGEAL ECHOCARDIOGRAM (TEE);  Surgeon: Loreli Slot, MD;  Location: [facility] OR;  Service: Open Heart Surgery;  Laterality: N/A;  . TONSILLECTOMY    . US ECHOCARDIOGRAPHY  04/09/2010   Possible small patent foramen ovale suspected;trace MR, mild TR.    OB History    Gravida Para Term Preterm AB Living   5 4 4   1 4    SAB TAB Ectopic Multiple Live Births  1       4       Home Medications    Prior to Admission medications   Medication Sig Start Date End Date Taking? Authorizing Provider  albuterol (PROVENTIL HFA;VENTOLIN HFA) 108 (90 BASE) MCG/ACT inhaler Inhale 2 puffs into the lungs every 6 (six) hours as needed. For shortness of breath or wheezing   Yes [provider]  aspirin EC 325 MG EC tablet Take 1 tablet (325 mg total) by mouth daily. 12/27/14  Yes Barrett, Erin R, PA-C  colchicine 0.6 MG tablet Take 0.6 mg by mouth 2 (two) times daily as needed (gout).    Yes  [provider]  Dulaglutide (TRULICITY [place]) Inject into the skin. Pt is unsure of the dosage she's taking. Pt states she will call the office to let us know.   Yes [provider]  Fluticasone-Salmeterol (ADVAIR) 100-50 MCG/DOSE AEPB Inhale 1 puff into the lungs 2 (two) times daily.   Yes [provider]  glipiZIDE (GLUCOTROL) 10 MG tablet Take 10 mg by mouth 2 (two) times daily before a meal.   Yes [provider]  hydrochlorothiazide (HYDRODIURIL) 25 MG tablet Take 25 mg by mouth daily.   Yes [provider]  insulin detemir (LEVEMIR) 100 UNIT/ML injection Inject 10 Units into the skin at bedtime.   Yes [provider]  linagliptin (TRADJENTA) 5 MG TABS tablet Take 5 mg by mouth daily.   Yes [provider]  loratadine (CLARITIN) 10 MG tablet Take 10 mg by mouth daily as needed for allergies.   Yes [provider]  metoprolol tartrate (LOPRESSOR) 25 MG tablet TAKE 1 TABLET TWICE DAILY 05/27/17  Yes Marykay LexHarding, Jakiera Ehler W, MD  ondansetron (ZOFRAN ODT) 4 MG disintegrating tablet Take 1 tablet (4 mg total) by mouth every 8 (eight) hours as needed for nausea or vomiting. 03/08/15  Yes Osvaldo ShipperKrishnan, Gokul, MD  oxyCODONE (OXY IR/ROXICODONE) 5 MG immediate release tablet Take 1-2 tablets (5-10 mg total) by mouth every 3 (three) hours as needed for severe pain. 12/27/14  Yes Barrett, Erin R, PA-C  simvastatin (ZOCOR) 10 MG tablet Take 10 mg by mouth at bedtime.   Yes [provider]  traMADol (ULTRAM) 50 MG tablet Take 1-2 tablets (50-100 mg total) by mouth every 4 (four) hours as needed for moderate pain. 12/27/14  Yes Barrett, Erin R, PA-C  Phenylephrine-Chlorphen-DM 09-17-11.5 MG/5ML LIQD Take one half to 1 teaspoon every 4-6 hours as needed for drainage and congestion. May cause drowsiness. 02/08/17   Hayden RasmussenMabe, Ellisa Devivo, NP    Family History Family History  Problem Relation Age of Onset  . Pancreatic cancer Mother   . CAD Mother        Open  heart surgery 60s-70s  . Stroke Father   . Heart disease Sister        Unclear details    Social History Social History   Tobacco Use  . Smoking status: Never Smoker  . Smokeless tobacco: Never Used  Substance Use Topics  . Alcohol use: No    Alcohol/week: 0.0 oz  . Drug use: No     Allergies   Ace inhibitors; Lisinopril; Mushroom extract complex; and Tetanus toxoids   Review of Systems Review of Systems  Constitutional: Negative.   HENT:       See history of present illness  All other systems reviewed and are negative.    Physical Exam Triage Vital Signs ED Triage Vitals  Enc Vitals Group  BP 11/05/17 1755 (!) 147/74     Pulse Rate 11/05/17 1755 70     Resp 11/05/17 1755 20     Temp 11/05/17 1755 98.8 F (37.1 C)     Temp Source 11/05/17 1755 Oral     SpO2 11/05/17 1755 99 %     Weight --      Height --      Head Circumference --      Peak Flow --      Pain Score 11/05/17 1754 7     Pain Loc --      Pain Edu? --      Excl. in GC? --    No data found.  Updated Vital Signs BP (!) 147/74 (BP Location: Left Arm)   Pulse 70   Temp 98.8 F (37.1 C) (Oral)   Resp 20   SpO2 99%   Visual Acuity Right Eye Distance:   Left Eye Distance:   Bilateral Distance:    Right Eye Near:   Left Eye Near:    Bilateral Near:     Physical Exam  Constitutional: She appears well-developed and well-nourished.  Non-toxic appearance. She does not appear ill.  HENT:  Head: Atraumatic.  Mouth/Throat: Mucous membranes are normal. No tonsillar abscesses.  Bilateral TMs are normal. Oropharynx mildly pale with moderate amount of clear PND. No erythema and no exudate.  Neck: Normal range of motion. Neck supple.  Lymphadenopathy:    She has no cervical adenopathy.  Skin: Skin is warm and dry.     UC Treatments / Results  Labs (all labs ordered are listed, but only abnormal results are displayed) Labs Reviewed  CULTURE, GROUP A STREP [facility]  POCT RAPID STREP A     EKG  EKG Interpretation None       Radiology No results found.  Procedures Procedures (including critical care time)  Medications Ordered in UC Medications - No data to display   Initial Impression / Assessment and Plan / UC Course  I have reviewed the triage vital signs and the nursing notes.  Pertinent labs & imaging results that were available during my care of the patient were reviewed by me and considered in my medical decision making (see chart for details).    If your current antihistamine is not working start taking Chlor-Trimeton, generic chlorpheniramine. This is a stronger antihistamines can cause drowsiness. He may take a half a tablet. Also drink more water and washed the drainage down her throat. Cepacol lozenges for sore throat pain and Tylenol.    Final Clinical Impressions(s) / UC Diagnoses   Final diagnoses:  PND (post-nasal drip)  Sore throat    ED Discharge Orders    None       Controlled Substance Prescriptions [place] Controlled Substance Registry consulted? Not Applicable   Hayden RasmussenMabe, Rozlyn Yerby, NP 11/05/17 2049

## 2017-11-05 NOTE — ED Triage Notes (Signed)
PT C/O: Sore throat  ONSET: 1 week  SX ALSO INCLUDE: bilateral ear pain, odynophagia   DENIES: fevers, chills  TAKING MEDS: acetaminophen   A&O x4... NAD... Ambulatory

## 2017-11-05 NOTE — Discharge Instructions (Signed)
If your current antihistamine is not working start taking Chlor-Trimeton, generic chlorpheniramine. This is a stronger antihistamines can cause drowsiness. He may take a half a tablet. Also drink more water and washed the drainage down her throat. Cepacol lozenges for sore throat pain and Tylenol.

## 2017-11-08 LAB — CULTURE, GROUP A STREP (THRC)

## 2017-11-12 DIAGNOSIS — I2510 Atherosclerotic heart disease of native coronary artery without angina pectoris: Secondary | ICD-10-CM | POA: Diagnosis not present

## 2017-11-12 DIAGNOSIS — J019 Acute sinusitis, unspecified: Secondary | ICD-10-CM | POA: Diagnosis not present

## 2017-11-12 DIAGNOSIS — E1321 Other specified diabetes mellitus with diabetic nephropathy: Secondary | ICD-10-CM | POA: Diagnosis not present

## 2017-11-12 DIAGNOSIS — I10 Essential (primary) hypertension: Secondary | ICD-10-CM | POA: Diagnosis not present

## 2017-12-30 DIAGNOSIS — J329 Chronic sinusitis, unspecified: Secondary | ICD-10-CM | POA: Diagnosis not present

## 2018-01-06 DIAGNOSIS — J4520 Mild intermittent asthma, uncomplicated: Secondary | ICD-10-CM | POA: Diagnosis not present

## 2018-01-06 DIAGNOSIS — I2510 Atherosclerotic heart disease of native coronary artery without angina pectoris: Secondary | ICD-10-CM | POA: Diagnosis not present

## 2018-01-06 DIAGNOSIS — E7849 Other hyperlipidemia: Secondary | ICD-10-CM | POA: Diagnosis not present

## 2018-01-06 DIAGNOSIS — I10 Essential (primary) hypertension: Secondary | ICD-10-CM | POA: Diagnosis not present

## 2018-01-06 DIAGNOSIS — M1A9XX0 Chronic gout, unspecified, without tophus (tophi): Secondary | ICD-10-CM | POA: Diagnosis not present

## 2018-01-06 DIAGNOSIS — E1321 Other specified diabetes mellitus with diabetic nephropathy: Secondary | ICD-10-CM | POA: Diagnosis not present

## 2018-01-06 DIAGNOSIS — J302 Other seasonal allergic rhinitis: Secondary | ICD-10-CM | POA: Diagnosis not present

## 2018-01-07 DIAGNOSIS — J329 Chronic sinusitis, unspecified: Secondary | ICD-10-CM | POA: Diagnosis not present

## 2018-01-07 DIAGNOSIS — R0981 Nasal congestion: Secondary | ICD-10-CM | POA: Diagnosis not present

## 2018-02-05 ENCOUNTER — Encounter (HOSPITAL_COMMUNITY): Payer: Self-pay | Admitting: *Deleted

## 2018-02-05 ENCOUNTER — Other Ambulatory Visit: Payer: Self-pay

## 2018-02-05 ENCOUNTER — Ambulatory Visit (HOSPITAL_COMMUNITY)
Admission: EM | Admit: 2018-02-05 | Discharge: 2018-02-05 | Disposition: A | Discharge status: Home / Self Care | Payer: Medicare HMO | Attending: Family Medicine | Admitting: Family Medicine

## 2018-02-05 DIAGNOSIS — J0100 Acute maxillary sinusitis, unspecified: Secondary | ICD-10-CM | POA: Diagnosis not present

## 2018-02-05 MED ORDER — DOXYCYCLINE HYCLATE 100 MG PO CAPS: 100.0000 mg | ORAL_CAPSULE | Freq: Two times a day (BID) | ORAL | 0 refills | Status: DC

## 2018-02-05 NOTE — ED Provider Notes (Signed)
  [facility]  Chief Complaint  Patient presents with  . Cough  . Nasal Congestion    Antony HasteGlenda Pullin here for URI complaints.  Duration: 2 weeks  Associated symptoms: sinus congestion, sinus pain, rhinorrhea, ear fullness Denies: ear pain/drainage, sore throat, wheezing, shortness of breath, myalgia and fevers Treatment to date: INCS Sick contacts: No  ROS:  Const: Denies fevers HEENT: As noted in HPI Lungs: No SOB  Past Medical History:  Diagnosis Date  . Asthma   . CAD (coronary artery disease)    a. 12/2014: NSTEMI with cath showing Ostial LAD ~95%, mLAD 80-90%; EF ~40-45%, LAD disease compromised major D1 branch. CABG recommended and performed with LIMA-LAD and SVG-D1.   [name] Kitchen. CKD (chronic kidney disease), stage III (HCC)   . Colitis   . Diabetes mellitus type 2, insulin dependent (HCC)   . Essential hypertension   . Hyperlipidemia with target LDL less than 70 01/28/2014  . Metabolic syndrome: DM, HTN, Obesity as well as HLD 12/18/2014  . NSTEMI (non-ST elevated myocardial infarction) (HCC) 12/18/2014   Echo 1/6: EF 60-65%, no Regional WMA, Gr 1 DD  . Obesity    BMI ~35-36  . 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 Seasonal allergies    Family History  Problem Relation Age of Onset  . Pancreatic cancer Mother   . CAD Mother        Open heart surgery 60s-70s  . Stroke Father   . Heart disease Sister        Unclear details    BP (!) 160/83   Pulse 66   Temp 98.1 F (36.7 C) (Oral)   Resp 16   SpO2 98%  General: Awake, alert, appears stated age HEENT: AT, [place], ears patent b/l and TM's neg, nares patent w/o discharge, max sinuses ttp b/l, pharynx pink and without exudates, MMM Neck: No masses or asymmetry Heart: RRR Lungs: CTAB, no accessory muscle use Psych: Age appropriate judgment and insight, normal mood and affect  Acute maxillary sinusitis, recurrence not specified- doxy 100 mg BID, 10 d  Orders as above. Tylenol for pain.  Continue to  push fluids, practice good hand hygiene, cover mouth when coughing. F/u prn.  Consider discussing w pcp allergy testing/referral given recurrent sinus infections. Pt voiced understanding and agreement to the plan.  Jilda Rocheicholas Paul Country ClubWendling, [place]DO 02-05-18 1:03 PM    Sharlene DoryWendling, Nicholas Paul, DO 02/05/18 1304

## 2018-02-05 NOTE — ED Triage Notes (Signed)
C/O cough and nasal congestion x 2 wks; nasal discharge now getting thicker.  Denies fever.

## 2018-02-08 DIAGNOSIS — J302 Other seasonal allergic rhinitis: Secondary | ICD-10-CM | POA: Diagnosis not present

## 2018-02-08 DIAGNOSIS — M1A9XX0 Chronic gout, unspecified, without tophus (tophi): Secondary | ICD-10-CM | POA: Diagnosis not present

## 2018-02-08 DIAGNOSIS — E1321 Other specified diabetes mellitus with diabetic nephropathy: Secondary | ICD-10-CM | POA: Diagnosis not present

## 2018-02-08 DIAGNOSIS — I10 Essential (primary) hypertension: Secondary | ICD-10-CM | POA: Diagnosis not present

## 2018-02-08 DIAGNOSIS — E7849 Other hyperlipidemia: Secondary | ICD-10-CM | POA: Diagnosis not present

## 2018-02-22 DIAGNOSIS — N183 Chronic kidney disease, stage 3 (moderate): Secondary | ICD-10-CM | POA: Diagnosis not present

## 2018-03-14 ENCOUNTER — Other Ambulatory Visit: Payer: Self-pay | Admitting: Student

## 2018-03-15 NOTE — Telephone Encounter (Signed)
Rx has been sent to the pharmacy electronically. ° °

## 2018-03-21 ENCOUNTER — Emergency Department (HOSPITAL_COMMUNITY)
Admission: EM | Admit: 2018-03-21 | Discharge: 2018-03-22 | Disposition: A | Discharge status: Home / Self Care | Payer: Medicare HMO | Attending: Emergency Medicine | Admitting: Emergency Medicine

## 2018-03-21 ENCOUNTER — Other Ambulatory Visit: Payer: Self-pay

## 2018-03-21 ENCOUNTER — Encounter (HOSPITAL_COMMUNITY): Payer: Self-pay | Admitting: *Deleted

## 2018-03-21 DIAGNOSIS — Z7982 Long term (current) use of aspirin: Secondary | ICD-10-CM | POA: Diagnosis not present

## 2018-03-21 DIAGNOSIS — E1122 Type 2 diabetes mellitus with diabetic chronic kidney disease: Secondary | ICD-10-CM | POA: Diagnosis not present

## 2018-03-21 DIAGNOSIS — N183 Chronic kidney disease, stage 3 (moderate): Secondary | ICD-10-CM | POA: Diagnosis not present

## 2018-03-21 DIAGNOSIS — J45909 Unspecified asthma, uncomplicated: Secondary | ICD-10-CM | POA: Insufficient documentation

## 2018-03-21 DIAGNOSIS — R112 Nausea with vomiting, unspecified: Secondary | ICD-10-CM

## 2018-03-21 DIAGNOSIS — D72829 Elevated white blood cell count, unspecified: Secondary | ICD-10-CM | POA: Diagnosis not present

## 2018-03-21 DIAGNOSIS — E860 Dehydration: Secondary | ICD-10-CM | POA: Diagnosis not present

## 2018-03-21 DIAGNOSIS — Z794 Long term (current) use of insulin: Secondary | ICD-10-CM | POA: Insufficient documentation

## 2018-03-21 DIAGNOSIS — I129 Hypertensive chronic kidney disease with stage 1 through stage 4 chronic kidney disease, or unspecified chronic kidney disease: Secondary | ICD-10-CM | POA: Diagnosis not present

## 2018-03-21 DIAGNOSIS — K529 Noninfective gastroenteritis and colitis, unspecified: Secondary | ICD-10-CM | POA: Diagnosis not present

## 2018-03-21 DIAGNOSIS — R197 Diarrhea, unspecified: Secondary | ICD-10-CM

## 2018-03-21 DIAGNOSIS — I2510 Atherosclerotic heart disease of native coronary artery without angina pectoris: Secondary | ICD-10-CM | POA: Insufficient documentation

## 2018-03-21 DIAGNOSIS — Z951 Presence of aortocoronary bypass graft: Secondary | ICD-10-CM | POA: Diagnosis not present

## 2018-03-21 DIAGNOSIS — Z79899 Other long term (current) drug therapy: Secondary | ICD-10-CM | POA: Diagnosis not present

## 2018-03-21 LAB — URINALYSIS, ROUTINE W REFLEX MICROSCOPIC
BILIRUBIN URINE: NEGATIVE
Glucose, UA: NEGATIVE mg/dL
Hgb urine dipstick: NEGATIVE
KETONES UR: NEGATIVE mg/dL
Leukocytes, UA: NEGATIVE
NITRITE: NEGATIVE
PROTEIN: NEGATIVE mg/dL
Specific Gravity, Urine: 1.023 (ref 1.005–1.030)
pH: 5 (ref 5.0–8.0)

## 2018-03-21 LAB — COMPREHENSIVE METABOLIC PANEL
ALBUMIN: UNDETERMINED g/dL (ref 3.5–5.0)
ALT: UNDETERMINED U/L (ref 14–54)
ALT: 50 U/L (ref 14–54)
ANION GAP: 19 — AB (ref 5–15)
ANION GAP: 11 (ref 5–15)
AST: UNDETERMINED U/L (ref 15–41)
AST: 74 U/L — ABNORMAL HIGH (ref 15–41)
Albumin: 4.2 g/dL (ref 3.5–5.0)
Alkaline Phosphatase: UNDETERMINED U/L (ref 38–126)
Alkaline Phosphatase: 83 U/L (ref 38–126)
BILIRUBIN TOTAL: UNDETERMINED mg/dL (ref 0.3–1.2)
BUN: 26 mg/dL — ABNORMAL HIGH (ref 6–20)
BUN: 25 mg/dL — ABNORMAL HIGH (ref 6–20)
CALCIUM: 9.2 mg/dL (ref 8.9–10.3)
CHLORIDE: 111 mmol/L (ref 101–111)
CO2: 13 mmol/L — ABNORMAL LOW (ref 22–32)
CO2: 19 mmol/L — AB (ref 22–32)
Calcium: 9.6 mg/dL (ref 8.9–10.3)
Chloride: 109 mmol/L (ref 101–111)
Creatinine, Ser: 1.49 mg/dL — ABNORMAL HIGH (ref 0.44–1.00)
Creatinine, Ser: 1.58 mg/dL — ABNORMAL HIGH (ref 0.44–1.00)
GFR calc Af Amer: 40 mL/min — ABNORMAL LOW (ref >60)
GFR calc non Af Amer: 33 mL/min — ABNORMAL LOW (ref >60)
GFR, EST AFRICAN AMERICAN: 38 mL/min — AB (ref >60)
GFR, EST NON AFRICAN AMERICAN: 35 mL/min — AB (ref >60)
GLUCOSE: 188 mg/dL — AB (ref 65–99)
Glucose, Bld: 189 mg/dL — ABNORMAL HIGH (ref 65–99)
POTASSIUM: 4.6 mmol/L (ref 3.5–5.1)
Potassium: 6.4 mmol/L (ref 3.5–5.1)
SODIUM: 141 mmol/L (ref 135–145)
Sodium: 141 mmol/L (ref 135–145)
TOTAL PROTEIN: UNDETERMINED g/dL (ref 6.5–8.1)
Total Bilirubin: 1 mg/dL (ref 0.3–1.2)
Total Protein: 7.9 g/dL (ref 6.5–8.1)

## 2018-03-21 LAB — CBC
HCT: 45.8 % (ref 36.0–46.0)
HEMOGLOBIN: 15.1 g/dL — AB (ref 12.0–15.0)
MCH: 32.7 pg (ref 26.0–34.0)
MCHC: 33 g/dL (ref 30.0–36.0)
MCV: 99.1 fL (ref 78.0–100.0)
Platelets: 148 10*3/uL — ABNORMAL LOW (ref 150–400)
RBC: 4.62 MIL/uL (ref 3.87–5.11)
RDW: 13.5 % (ref 11.5–15.5)
WBC: 17.9 10*3/uL — AB (ref 4.0–10.5)

## 2018-03-21 LAB — LIPASE, BLOOD
Lipase: UNDETERMINED U/L (ref 11–51)
Lipase: 22 U/L (ref 11–51)

## 2018-03-21 MED ORDER — ONDANSETRON 4 MG PO TBDP
4.0000 mg | ORAL_TABLET | Freq: Once | ORAL | Status: AC | PRN
  Administered 2018-03-21: 4 mg via ORAL
  Filled 2018-03-21: qty 1

## 2018-03-21 NOTE — ED Notes (Addendum)
Spoke with Zollie BeckersWalter from lab who states that there is not enough specimen in the tube to do a CMP or Lipase but is enough for a BMP. Order for BMP placed at this time and lipase and CMP reordered to be completed as soon as possible.

## 2018-03-21 NOTE — ED Notes (Signed)
Pt approached desk and asked about wait time. Was upset we called for her (vitals) earlier and was in the back getting blood drawn. Pt encourage to stay and seen taking a seat in the lobby.

## 2018-03-21 NOTE — ED Notes (Signed)
No reply x3 for vitals 

## 2018-03-21 NOTE — ED Provider Notes (Signed)
Patient placed in Quick Look pathway, seen and evaluated   Chief Complaint: Abdominal Pain  HPI:   66 y.o. female who presents for 54evaluation of generalized abdominal pain, nausea/vomiting, diarrhea that began this morning.  Patient reports abdomen pain is generalized with no focal point of tenderness.  Patient reports multiple episodes of nonbloody, nonbilious emesis since onset of symptoms.  Patient reports she is also had several episodes of nonbloody diarrhea since onset of symptoms.  Patient reports that her granddaughter and her ordered some pizza last night and states that granddaughter is at home with same symptoms.  No chest pain, difficulty breathing.  ROS: Abdominal Pain  Physical Exam:   Gen: No distress  Neuro: Awake and Alert  Skin: Warm    Focused Exam: Diffuse tenderness palpation noted to the abdomen.  No rigidity, guarding.  Abdomen soft, nondistended.   Initiation of care has begun. The patient has been counseled on the process, plan, and necessity for staying for the completion/evaluation, and the remainder of the medical screening examination    Rosana HoesLayden, Lindsey A, PA-C 03/21/18 [id number]    Haviland, Julie, MD 03/21/18 [phone]

## 2018-03-21 NOTE — ED Triage Notes (Signed)
Pt states acute onset diarrhea and vomiting since this am (approx 20 x's each).  Per pt, her grandaughter has same and they ordered out pizza last night.

## 2018-03-22 ENCOUNTER — Emergency Department (HOSPITAL_COMMUNITY): Payer: Medicare HMO

## 2018-03-22 DIAGNOSIS — R197 Diarrhea, unspecified: Secondary | ICD-10-CM | POA: Diagnosis not present

## 2018-03-22 MED ORDER — LOPERAMIDE HCL 2 MG PO CAPS
4.0000 mg | ORAL_CAPSULE | Freq: Once | ORAL | Status: AC
  Administered 2018-03-22: 4 mg via ORAL
  Filled 2018-03-22: qty 2

## 2018-03-22 MED ORDER — ONDANSETRON HCL 4 MG/2ML IJ SOLN
4.0000 mg | Freq: Once | INTRAMUSCULAR | Status: AC
  Administered 2018-03-22: 4 mg via INTRAVENOUS
  Filled 2018-03-22: qty 2

## 2018-03-22 MED ORDER — SODIUM CHLORIDE 0.9 % IV BOLUS
500.0000 mL | Freq: Once | INTRAVENOUS | Status: AC
  Administered 2018-03-22: 500 mL via INTRAVENOUS

## 2018-03-22 MED ORDER — ONDANSETRON 4 MG PO TBDP: 4.0000 mg | ORAL_TABLET | Freq: Three times a day (TID) | ORAL | 0 refills | Status: DC | PRN

## 2018-03-22 NOTE — ED Provider Notes (Signed)
[facility] EMERGENCY DEPARTMENT Provider Note   CSN: [id number] Arrival date & time: 03/21/18  1555     History   Chief Complaint Chief Complaint  Patient presents with  . Diarrhea  . Emesis    HPI Cheryl Gill is a 67 y.o. female.  HPI  This is a 67 year old female with a history of coronary artery disease, chronic kidney disease, diabetes who presents with nausea, vomiting.  Patient reports onset of symptoms yesterday morning at 8 AM.  She has 2 grandchildren with similar symptoms.  She reports nonbilious, nonbloody emesis and nonbloody diarrhea.  She denies any abdominal pain but does report "soreness" with vomiting.  She denies any fevers or myalgias.  She denies any chest pain or shortness of breath.  Past Medical History:  Diagnosis Date  . Asthma   . CAD (coronary artery disease)    a. 12/2014: NSTEMI with cath showing Ostial LAD ~95%, mLAD 80-90%; EF ~40-45%, LAD disease compromised major D1 branch. CABG recommended and performed with LIMA-LAD and SVG-D1.   [name] Kitchen CKD (chronic kidney disease), stage III (HCC)   . Colitis   . Diabetes mellitus type 2, insulin dependent (HCC)   . Essential hypertension   . Hyperlipidemia with target LDL less than 70 01/28/2014  . Metabolic syndrome: DM, HTN, Obesity as well as HLD 12/18/2014  . NSTEMI (non-ST elevated myocardial infarction) (HCC) 12/18/2014   Echo 1/6: EF 60-65%, no Regional WMA, Gr 1 DD  . Obesity    BMI ~35-36  . 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 Seasonal allergies     Patient Active Problem List   Diagnosis Date Noted  . Colitis, acute 03/05/2015  . Colitis 03/05/2015  . Lower abdominal pain   . 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 Atherosclerotic heart disease of native coronary artery with angina pectoris (HCC) 12/19/2014  . NSTEMI (non-ST elevated myocardial infarction) (HCC) 12/18/2014  . Obesity, Class II, BMI 35-39.9 12/18/2014  . Metabolic syndrome: DM, HTN, Obesity as well as HLD  12/18/2014  . Frequency of urination 07/17/2014  . Nocturia more than twice per night 07/17/2014  . Abdominal pain 01/28/2014  . Asthma, chronic 01/28/2014  . Hyperlipidemia with target LDL less than 70 01/28/2014  . Leukocytosis, unspecified 01/28/2014  . Normocytic anemia 01/18/2013  . 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 Diabetes mellitus type 2, uncontrolled (HCC) 01/16/2013  . Seasonal allergies     Past Surgical History:  Procedure Laterality Date  . ABDOMINAL HYSTERECTOMY    . CARDIAC SURGERY    . CORONARY ARTERY BYPASS GRAFT N/A 12/21/2014   Procedure: CORONARY ARTERY BYPASS GRAFTING (CABG) TIMES TWO USING LEFT INTERNAL MAMMARY AND RIGHT SAPHENOUS LEG VEIN HARVESTED ENDOSCOPICALLY;  Surgeon: Loreli Slot, MD;  Location: [facility] OR;  Service: Open Heart Surgery;  Laterality: N/A;  . LEFT HEART CATHETERIZATION WITH CORONARY ANGIOGRAM N/A 12/18/2014   Procedure: LEFT HEART CATHETERIZATION WITH CORONARY ANGIOGRAM;  Surgeon: Peter M Swaziland, MD;  Location: [facility] CATH LAB;  Service: Cardiovascular;  Laterality: N/A;  . NM MYOCAR PERF WALL MOTION  04/09/2010   Abnormal study - appears to be a small area of apical infarction. Reversible ischemia is not seen.  . TEE WITHOUT CARDIOVERSION N/A 12/21/2014   Procedure: TRANSESOPHAGEAL ECHOCARDIOGRAM (TEE);  Surgeon: Loreli Slot, MD;  Location: [facility] OR;  Service: Open Heart Surgery;  Laterality: N/A;  . TONSILLECTOMY    . US  ECHOCARDIOGRAPHY  04/09/2010   Possible small patent foramen ovale suspected;trace MR, mild TR.     OB History    Gravida  5   Para  4   Term  4   Preterm      AB  1   Living  4     SAB  1   TAB      Ectopic      Multiple      Live Births  4            Home Medications    Prior to Admission medications   Medication Sig Start Date End Date Taking? Authorizing Provider  albuterol (PROVENTIL HFA;VENTOLIN HFA) 108 (90  BASE) MCG/ACT inhaler Inhale 2 puffs into the lungs every 6 (six) hours as needed. For shortness of breath or wheezing   Yes [provider]  aspirin EC 81 MG tablet Take 81 mg by mouth daily.   Yes [provider]  cetirizine (ZYRTEC) 10 MG tablet Take 10 mg by mouth daily as needed for allergies.   Yes [provider]  colchicine 0.6 MG tablet Take 0.6 mg by mouth 2 (two) times daily as needed (gout).    Yes [provider]  cycloSPORINE (RESTASIS) 0.05 % ophthalmic emulsion Place 1 drop into both eyes 2 (two) times daily. 10/29/17  Yes [provider]  febuxostat (ULORIC) 40 MG tablet Take 40 mg by mouth daily.   Yes [provider]  Fluticasone-Salmeterol (ADVAIR) 100-50 MCG/DOSE AEPB Inhale 1 puff into the lungs 2 (two) times daily.   Yes [provider]  glipiZIDE (GLUCOTROL) 10 MG tablet Take 10 mg by mouth 2 (two) times daily before a meal.   Yes [provider]  hydrochlorothiazide (HYDRODIURIL) 25 MG tablet Take 25 mg by mouth daily.   Yes [provider]  LANTUS SOLOSTAR 100 UNIT/ML Solostar Pen Inject 50 Units into the skin at bedtime. 03/15/18  Yes [provider]  metoprolol tartrate (LOPRESSOR) 25 MG tablet TAKE 1 TABLET BY MOUTH TWICE DAILY 03/15/18  Yes Marykay LexHarding, David W, MD  simvastatin (ZOCOR) 40 MG tablet Take 40 mg by mouth at bedtime. 02/01/18  Yes [provider]  aspirin EC 325 MG EC tablet Take 1 tablet (325 mg total) by mouth daily. Patient not taking: Reported on 03/22/2018 12/27/14   Barrett, Rae RoamErin R, PA-C  doxycycline (VIBRAMYCIN) 100 MG capsule Take 1 capsule (100 mg total) by mouth 2 (two) times daily. Patient not taking: Reported on 03/22/2018 02/05/18   Sharlene DoryWendling, Nicholas Paul, DO  metoprolol tartrate (LOPRESSOR) 25 MG tablet TAKE 1 TABLET TWICE DAILY Patient not taking: Reported on 03/22/2018 05/27/17   Marykay LexHarding, David W, MD  ondansetron (ZOFRAN ODT) 4 MG disintegrating tablet Take 1  tablet (4 mg total) by mouth every 8 (eight) hours as needed for nausea or vomiting. 03/22/18   Christoph Copelan, Mayer Maskerourtney F, MD  Phenylephrine-Chlorphen-DM 09-17-11.5 MG/5ML LIQD Take one half to 1 teaspoon every 4-6 hours as needed for drainage and congestion. May cause drowsiness. Patient not taking: Reported on 03/22/2018 02/08/17   Hayden RasmussenMabe, David, NP    Family History Family History  Problem Relation Age of Onset  . Pancreatic cancer Mother   . CAD Mother        Open heart surgery 60s-70s  . Stroke Father   . Heart disease Sister        Unclear details    Social History Social History   Tobacco Use  . Smoking  status: Never Smoker  . Smokeless tobacco: Never Used  Substance Use Topics  . Alcohol use: No  . Drug use: No     Allergies   Ace inhibitors; Lisinopril; Mushroom extract complex; and Tetanus toxoids   Review of Systems Review of Systems  Constitutional: Negative for fever.  Respiratory: Negative for shortness of breath.   Cardiovascular: Negative for chest pain.  Gastrointestinal: Positive for diarrhea, nausea and vomiting. Negative for abdominal pain.  Genitourinary: Negative for dysuria.  Musculoskeletal: Negative for back pain.  All other systems reviewed and are negative.    Physical Exam Updated Vital Signs BP 113/77   Pulse 82   Temp 98 F (36.7 C) (Oral)   Resp 18   Ht 5\' 2"  (1.575 m)   Wt 83.9 kg (185 lb)   SpO2 100%   BMI 33.84 kg/m   Physical Exam  Constitutional: She is oriented to person, place, and time.  Elderly, nontoxic, no acute distress  HENT:  Head: Normocephalic and atraumatic.  Mucous membranes dry  Cardiovascular: Normal rate, regular rhythm and normal heart sounds.  Pulmonary/Chest: Effort normal and breath sounds normal. No respiratory distress. She has no wheezes.  Abdominal: Soft. Bowel sounds are normal. There is no tenderness. There is no rebound and no guarding.  Neurological: She is alert and oriented to person, place, and time.   Skin: Skin is warm and dry.  Psychiatric: She has a normal mood and affect.  Nursing note and vitals reviewed.    ED Treatments / Results  Labs (all labs ordered are listed, but only abnormal results are displayed) Labs Reviewed  COMPREHENSIVE METABOLIC PANEL - Abnormal; Notable for the following components:      Result Value   Potassium 6.4 (*)    CO2 13 (*)    Glucose, Bld 188 (*)    BUN 26 (*)    Creatinine, Ser 1.49 (*)    GFR calc non Af Amer 35 (*)    GFR calc Af Amer 40 (*)    Anion gap 19 (*)    All other components within normal limits  CBC - Abnormal; Notable for the following components:   WBC 17.9 (*)    Hemoglobin 15.1 (*)    Platelets 148 (*)    All other components within normal limits  URINALYSIS, ROUTINE W REFLEX MICROSCOPIC - Abnormal; Notable for the following components:   APPearance HAZY (*)    All other components within normal limits  COMPREHENSIVE METABOLIC PANEL - Abnormal; Notable for the following components:   CO2 19 (*)    Glucose, Bld 189 (*)    BUN 25 (*)    Creatinine, Ser 1.58 (*)    AST 74 (*)    GFR calc non Af Amer 33 (*)    GFR calc Af Amer 38 (*)    All other components within normal limits  LIPASE, BLOOD  LIPASE, BLOOD    EKG None  Radiology Dg Abdomen Acute W/chest  Result Date: 03/22/2018 CLINICAL DATA:  Nausea and diarrhea. EXAM: DG ABDOMEN ACUTE W/ 1V CHEST COMPARISON:  Chest radiograph 01/08/2016.  Abdominal CT 03/05/2015. FINDINGS: Post median sternotomy. Upper normal heart size with aortic tortuosity. Probable hiatal hernia. The lungs are clear. There is no free intra-abdominal air. No dilated bowel loops to suggest obstruction. Small volume of stool throughout the colon. Probable retained barium within diverticula in the descending colon versus pericolonic calcified lymph node, unchanged. No radiopaque calculi. No acute osseous abnormalities are seen. IMPRESSION: 1.  Normal bowel gas pattern.  No free air. 2. Small hiatal  hernia. 3.  No acute pulmonary process. Electronically Signed   By: Rubye Oaks M.D.   On: 03/22/2018 02:33    Procedures Procedures (including critical care time)  Medications Ordered in ED Medications  ondansetron (ZOFRAN-ODT) disintegrating tablet 4 mg (4 mg Oral Given 03/21/18 1823)  sodium chloride 0.9 % bolus 500 mL (0 mLs Intravenous Stopped 03/22/18 0138)  ondansetron (ZOFRAN) injection 4 mg (4 mg Intravenous Given 03/22/18 0109)  loperamide (IMODIUM) capsule 4 mg (4 mg Oral Given 03/22/18 0108)     Initial Impression / Assessment and Plan / ED Course  I have reviewed the triage vital signs and the nursing notes.  Pertinent labs & imaging results that were available during my care of the patient were reviewed by me and considered in my medical decision making (see chart for details).     Patient presents with nausea, vomiting, and diarrhea.  She is overall nontoxic appearing and vital signs are reassuring.  Abdomen is benign.  Given family with similar symptoms, suspect viral etiology.  Lab work is notable only for mild elevation in creatinine, and mild elevation in AST.  Initial CBC appears hemoconcentrated but she does have a leukocytosis.  X-ray shows no evidence of air-fluid levels to just obstruction.  On recheck, she is tolerating fluids and feels much better after hydration and Zofran.  After history, exam, and medical workup I feel the patient has been appropriately medically screened and is safe for discharge home. Pertinent diagnoses were discussed with the patient. Patient was given return precautions.   Final Clinical Impressions(s) / ED Diagnoses   Final diagnoses:  Gastroenteritis  Dehydration  Nausea vomiting and diarrhea    ED Discharge Orders        Ordered    ondansetron (ZOFRAN ODT) 4 MG disintegrating tablet  Every 8 hours PRN     03/22/18 0239       Shon Baton, MD 03/22/18 [phone]

## 2018-03-22 NOTE — ED Notes (Signed)
Patient ambulatory to the room, changed in gown.  Complains only of stomach being upset no stomach pain at this time. Stated Zofran that was given prior helped with the nausea.

## 2018-03-22 NOTE — ED Notes (Signed)
Patient transported to X-ray 

## 2018-03-22 NOTE — ED Notes (Signed)
Patient Alert and oriented to baseline. Stable and ambulatory to baseline. Patient verbalized understanding of the discharge instructions.  Patient belongings were taken by the patient.   

## 2018-03-22 NOTE — ED Notes (Signed)
Patient returned from XRAY 

## 2018-03-22 NOTE — Discharge Instructions (Addendum)
Were seen today for nausea, vomiting, and diarrhea.  This is likely related to a virus.  Make sure that you are staying hydrated at home.  Take Zofran as needed for nausea.

## 2018-03-24 ENCOUNTER — Emergency Department (HOSPITAL_COMMUNITY)
Admission: EM | Admit: 2018-03-24 | Discharge: 2018-03-24 | Disposition: A | Discharge status: Home / Self Care | Payer: Medicare HMO | Attending: Emergency Medicine | Admitting: Emergency Medicine

## 2018-03-24 ENCOUNTER — Encounter (HOSPITAL_COMMUNITY): Payer: Self-pay | Admitting: *Deleted

## 2018-03-24 ENCOUNTER — Other Ambulatory Visit: Payer: Self-pay

## 2018-03-24 DIAGNOSIS — R197 Diarrhea, unspecified: Secondary | ICD-10-CM | POA: Diagnosis not present

## 2018-03-24 DIAGNOSIS — Z794 Long term (current) use of insulin: Secondary | ICD-10-CM | POA: Diagnosis not present

## 2018-03-24 DIAGNOSIS — N183 Chronic kidney disease, stage 3 (moderate): Secondary | ICD-10-CM | POA: Insufficient documentation

## 2018-03-24 DIAGNOSIS — I129 Hypertensive chronic kidney disease with stage 1 through stage 4 chronic kidney disease, or unspecified chronic kidney disease: Secondary | ICD-10-CM | POA: Insufficient documentation

## 2018-03-24 DIAGNOSIS — I2510 Atherosclerotic heart disease of native coronary artery without angina pectoris: Secondary | ICD-10-CM | POA: Insufficient documentation

## 2018-03-24 DIAGNOSIS — Z79899 Other long term (current) drug therapy: Secondary | ICD-10-CM | POA: Diagnosis not present

## 2018-03-24 DIAGNOSIS — Z7982 Long term (current) use of aspirin: Secondary | ICD-10-CM | POA: Diagnosis not present

## 2018-03-24 DIAGNOSIS — Z951 Presence of aortocoronary bypass graft: Secondary | ICD-10-CM | POA: Diagnosis not present

## 2018-03-24 DIAGNOSIS — E1122 Type 2 diabetes mellitus with diabetic chronic kidney disease: Secondary | ICD-10-CM | POA: Diagnosis not present

## 2018-03-24 LAB — URINALYSIS, ROUTINE W REFLEX MICROSCOPIC
Bilirubin Urine: NEGATIVE
GLUCOSE, UA: NEGATIVE mg/dL
Hgb urine dipstick: NEGATIVE
Ketones, ur: NEGATIVE mg/dL
LEUKOCYTES UA: NEGATIVE
NITRITE: NEGATIVE
PH: 5 (ref 5.0–8.0)
Protein, ur: NEGATIVE mg/dL
SPECIFIC GRAVITY, URINE: 1.008 (ref 1.005–1.030)

## 2018-03-24 LAB — COMPREHENSIVE METABOLIC PANEL
ALT: 57 U/L — ABNORMAL HIGH (ref 14–54)
ANION GAP: 12 (ref 5–15)
AST: 60 U/L — ABNORMAL HIGH (ref 15–41)
Albumin: 3.6 g/dL (ref 3.5–5.0)
Alkaline Phosphatase: 66 U/L (ref 38–126)
BUN: 30 mg/dL — ABNORMAL HIGH (ref 6–20)
CHLORIDE: 110 mmol/L (ref 101–111)
CO2: 17 mmol/L — ABNORMAL LOW (ref 22–32)
Calcium: 8.6 mg/dL — ABNORMAL LOW (ref 8.9–10.3)
Creatinine, Ser: 1.93 mg/dL — ABNORMAL HIGH (ref 0.44–1.00)
GFR calc Af Amer: 30 mL/min — ABNORMAL LOW (ref >60)
GFR, EST NON AFRICAN AMERICAN: 26 mL/min — AB (ref >60)
Glucose, Bld: 153 mg/dL — ABNORMAL HIGH (ref 65–99)
POTASSIUM: 3.6 mmol/L (ref 3.5–5.1)
Sodium: 139 mmol/L (ref 135–145)
TOTAL PROTEIN: 7 g/dL (ref 6.5–8.1)
Total Bilirubin: 0.9 mg/dL (ref 0.3–1.2)

## 2018-03-24 LAB — CBC
HEMATOCRIT: 38.9 % (ref 36.0–46.0)
HEMOGLOBIN: 12.7 g/dL (ref 12.0–15.0)
MCH: 31.8 pg (ref 26.0–34.0)
MCHC: 32.6 g/dL (ref 30.0–36.0)
MCV: 97.3 fL (ref 78.0–100.0)
Platelets: 173 10*3/uL (ref 150–400)
RBC: 4 MIL/uL (ref 3.87–5.11)
RDW: 13.3 % (ref 11.5–15.5)
WBC: 11.2 10*3/uL — AB (ref 4.0–10.5)

## 2018-03-24 LAB — LIPASE, BLOOD: LIPASE: 26 U/L (ref 11–51)

## 2018-03-24 MED ORDER — SODIUM CHLORIDE 0.9 % IV BOLUS
1000.0000 mL | Freq: Once | INTRAVENOUS | Status: AC
  Administered 2018-03-24: 1000 mL via INTRAVENOUS

## 2018-03-24 NOTE — ED Triage Notes (Signed)
Pt states that she was seen on Tuesday for N/V/D. Pt states that he vomiting has stopped but her diarrhea and nausea has continued.

## 2018-03-24 NOTE — Discharge Instructions (Signed)
Make sure you are drinking plenty of fluids and staying hydrated.  As we discussed, follow-up with your primary care doctor in the next 2-4 days for further evaluation.  Return to the emergency department for any worsening diarrhea, fever, blood in stool, weakness, lightheadedness, difficulty breathing, chest pain, abdominal pain or any other worsening or concerning symptoms.

## 2018-03-24 NOTE — ED Provider Notes (Signed)
[facility] EMERGENCY DEPARTMENT Provider Note   CSN: [id number] Arrival date & time: 03/24/18  4098     History   Chief Complaint Chief Complaint  Patient presents with  . Diarrhea    HPI Cheryl Gill is a 67 y.o. female who presents here for evaluation of asthma, CAD, CKD for presentation of diarrhea.  Patient was seen here on 03/21/18 for nausea, vomiting and diarrhea.  She reports that family members at home were sick with the same symptoms.  Patient's evaluation at that time was consistent with viral gastroenteritis.  She tolerated p.o. after Zofran and got IVF.  X-ray of abdomen was unremarkable.  Patient was discharged home with supportive therapies.  She returns today because she states she has had persistent diarrhea.  Patient reports she has multiple episodes of nonbloody diarrhea today.  She states that the diarrhea is very watery and loose.  She states that almost anything she eats results and diarrhea.  She does report some associated abdominal soreness but denies any pain.  Patient states that the nausea and vomiting have improved.  She is able to tolerate p.o. without any difficulty.  Patient denies any fevers, chest pain.  Patient denies any recent antibiotic use or travel outside the country.   The history is provided by the patient.    Past Medical History:  Diagnosis Date  . Asthma   . CAD (coronary artery disease)    a. 12/2014: NSTEMI with cath showing Ostial LAD ~95%, mLAD 80-90%; EF ~40-45%, LAD disease compromised major D1 branch. CABG recommended and performed with LIMA-LAD and SVG-D1.   [name] Kitchen CKD (chronic kidney disease), stage III (HCC)   . Colitis   . Diabetes mellitus type 2, insulin dependent (HCC)   . Essential hypertension   . Hyperlipidemia with target LDL less than 70 01/28/2014  . Metabolic syndrome: DM, HTN, Obesity as well as HLD 12/18/2014  . NSTEMI (non-ST elevated myocardial infarction) (HCC) 12/18/2014   Echo 1/6: EF 60-65%, no  Regional WMA, Gr 1 DD  . Obesity    BMI ~35-36  . 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 Seasonal allergies     Patient Active Problem List   Diagnosis Date Noted  . Colitis, acute 03/05/2015  . Colitis 03/05/2015  . Lower abdominal pain   . 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 Atherosclerotic heart disease of native coronary artery with angina pectoris (HCC) 12/19/2014  . NSTEMI (non-ST elevated myocardial infarction) (HCC) 12/18/2014  . Obesity, Class II, BMI 35-39.9 12/18/2014  . Metabolic syndrome: DM, HTN, Obesity as well as HLD 12/18/2014  . Frequency of urination 07/17/2014  . Nocturia more than twice per night 07/17/2014  . Abdominal pain 01/28/2014  . Asthma, chronic 01/28/2014  . Hyperlipidemia with target LDL less than 70 01/28/2014  . Leukocytosis, unspecified 01/28/2014  . Normocytic anemia 01/18/2013  . 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 Diabetes mellitus type 2, uncontrolled (HCC) 01/16/2013  . Seasonal allergies     Past Surgical History:  Procedure Laterality Date  . ABDOMINAL HYSTERECTOMY    . CARDIAC SURGERY    . CORONARY ARTERY BYPASS GRAFT N/A 12/21/2014   Procedure: CORONARY ARTERY BYPASS GRAFTING (CABG) TIMES TWO USING LEFT INTERNAL MAMMARY AND RIGHT SAPHENOUS LEG VEIN HARVESTED ENDOSCOPICALLY;  Surgeon: Loreli Slot, MD;  Location: [facility] OR;  Service: Open Heart Surgery;  Laterality: N/A;  . LEFT HEART CATHETERIZATION WITH CORONARY  ANGIOGRAM N/A 12/18/2014   Procedure: LEFT HEART CATHETERIZATION WITH CORONARY ANGIOGRAM;  Surgeon: Peter M Swaziland, MD;  Location: [facility] CATH LAB;  Service: Cardiovascular;  Laterality: N/A;  . NM MYOCAR PERF WALL MOTION  04/09/2010   Abnormal study - appears to be a small area of apical infarction. Reversible ischemia is not seen.  . TEE WITHOUT CARDIOVERSION N/A 12/21/2014   Procedure: TRANSESOPHAGEAL ECHOCARDIOGRAM (TEE);  Surgeon: Loreli Slot,  MD;  Location: [facility] OR;  Service: Open Heart Surgery;  Laterality: N/A;  . TONSILLECTOMY    . US ECHOCARDIOGRAPHY  04/09/2010   Possible small patent foramen ovale suspected;trace MR, mild TR.     OB History    Gravida  5   Para  4   Term  4   Preterm      AB  1   Living  4     SAB  1   TAB      Ectopic      Multiple      Live Births  4            Home Medications    Prior to Admission medications   Medication Sig Start Date End Date Taking? Authorizing Provider  albuterol (PROVENTIL HFA;VENTOLIN HFA) 108 (90 BASE) MCG/ACT inhaler Inhale 2 puffs into the lungs every 6 (six) hours as needed. For shortness of breath or wheezing    [provider]  aspirin EC 325 MG EC tablet Take 1 tablet (325 mg total) by mouth daily. Patient not taking: Reported on 03/22/2018 12/27/14   Barrett, Rae Roam, PA-C  aspirin EC 81 MG tablet Take 81 mg by mouth daily.    [provider]  cetirizine (ZYRTEC) 10 MG tablet Take 10 mg by mouth daily as needed for allergies.    [provider]  colchicine 0.6 MG tablet Take 0.6 mg by mouth 2 (two) times daily as needed (gout).     [provider]  cycloSPORINE (RESTASIS) 0.05 % ophthalmic emulsion Place 1 drop into both eyes 2 (two) times daily. 10/29/17   [provider]  doxycycline (VIBRAMYCIN) 100 MG capsule Take 1 capsule (100 mg total) by mouth 2 (two) times daily. Patient not taking: Reported on 03/22/2018 02/05/18   Sharlene Dory, DO  febuxostat (ULORIC) 40 MG tablet Take 40 mg by mouth daily.    [provider]  Fluticasone-Salmeterol (ADVAIR) 100-50 MCG/DOSE AEPB Inhale 1 puff into the lungs 2 (two) times daily.    [provider]  glipiZIDE (GLUCOTROL) 10 MG tablet Take 10 mg by mouth 2 (two) times daily before a meal.    [provider]  hydrochlorothiazide (HYDRODIURIL) 25 MG tablet Take 25 mg by mouth daily.    [provider]  LANTUS SOLOSTAR 100  UNIT/ML Solostar Pen Inject 50 Units into the skin at bedtime. 03/15/18   [provider]  metoprolol tartrate (LOPRESSOR) 25 MG tablet TAKE 1 TABLET TWICE DAILY Patient not taking: Reported on 03/22/2018 05/27/17   Marykay Lex, MD  metoprolol tartrate (LOPRESSOR) 25 MG tablet TAKE 1 TABLET BY MOUTH TWICE DAILY 03/15/18   Marykay Lex, MD  ondansetron (ZOFRAN ODT) 4 MG disintegrating tablet Take 1 tablet (4 mg total) by mouth every 8 (eight) hours as needed for nausea or vomiting. 03/22/18   Horton, Mayer Masker, MD  Phenylephrine-Chlorphen-DM 09-17-11.5 MG/5ML LIQD Take one half to 1 teaspoon every 4-6 hours as needed for drainage and congestion. May cause drowsiness. Patient not  taking: Reported on 03/22/2018 02/08/17   Hayden Rasmussen, NP  simvastatin (ZOCOR) 40 MG tablet Take 40 mg by mouth at bedtime. 02/01/18   [provider]    Family History Family History  Problem Relation Age of Onset  . Pancreatic cancer Mother   . CAD Mother        Open heart surgery 60s-70s  . Stroke Father   . Heart disease Sister        Unclear details    Social History Social History   Tobacco Use  . Smoking status: Never Smoker  . Smokeless tobacco: Never Used  Substance Use Topics  . Alcohol use: No  . Drug use: No     Allergies   Ace inhibitors; Lisinopril; Mushroom extract complex; and Tetanus toxoids   Review of Systems Review of Systems  Constitutional: Negative for fever.  Respiratory: Negative for cough and shortness of breath.   Cardiovascular: Negative for chest pain.  Gastrointestinal: Positive for diarrhea. Negative for abdominal pain, blood in stool, nausea and vomiting.  Musculoskeletal: Negative for back pain and neck pain.  Skin: Negative for rash.  Neurological: Negative for dizziness, numbness and headaches.  All other systems reviewed and are negative.    Physical Exam Updated Vital Signs BP 123/73   Pulse 73   Temp 98.4 F (36.9 C) (Oral)   Resp 16    SpO2 100%   Physical Exam  Constitutional: She is oriented to person, place, and time. She appears well-developed and well-nourished.  HENT:  Head: Normocephalic and atraumatic.  Mouth/Throat: Oropharynx is clear and moist and mucous membranes are normal.  Eyes: Pupils are equal, round, and reactive to light. Conjunctivae, EOM and lids are normal.  Neck: Full passive range of motion without pain.  Cardiovascular: Normal rate, regular rhythm, normal heart sounds and normal pulses. Exam reveals no gallop and no friction rub.  No murmur heard. Pulmonary/Chest: Effort normal and breath sounds normal.  Abdominal: Soft. Normal appearance. There is no tenderness. There is no rigidity and no guarding.  Abdomen is soft, non-distended, non-tender.   Musculoskeletal: Normal range of motion.  Neurological: She is alert and oriented to person, place, and time.  Skin: Skin is warm and dry. Capillary refill takes less than 2 seconds.  Psychiatric: She has a normal mood and affect. Her speech is normal.  Nursing note and vitals reviewed.    ED Treatments / Results  Labs (all labs ordered are listed, but only abnormal results are displayed) Labs Reviewed  COMPREHENSIVE METABOLIC PANEL - Abnormal; Notable for the following components:      Result Value   CO2 17 (*)    Glucose, Bld 153 (*)    BUN 30 (*)    Creatinine, Ser 1.93 (*)    Calcium 8.6 (*)    AST 60 (*)    ALT 57 (*)    GFR calc non Af Amer 26 (*)    GFR calc Af Amer 30 (*)    All other components within normal limits  CBC - Abnormal; Notable for the following components:   WBC 11.2 (*)    All other components within normal limits  URINALYSIS, ROUTINE W REFLEX MICROSCOPIC - Abnormal; Notable for the following components:   Color, Urine STRAW (*)    All other components within normal limits  LIPASE, BLOOD    EKG None  Radiology No results found.  Procedures Procedures (including critical care time)  Medications Ordered  in ED Medications  sodium chloride  0.9 % bolus 1,000 mL (0 mLs Intravenous Stopped 03/24/18 1323)  sodium chloride 0.9 % bolus 1,000 mL (0 mLs Intravenous Stopped 03/24/18 1457)     Initial Impression / Assessment and Plan / ED Course  I have reviewed the triage vital signs and the nursing notes.  Pertinent labs & imaging results that were available during my care of the patient were reviewed by me and considered in my medical decision making (see chart for details).     67 year old female who presents for evaluation of diarrhea.  Seen here on 03/21/18 for nausea, vomiting and diarrhea that was consistent with gastroenteritis.  Reports improvement in pain and vomiting but still having profuse diarrhea.  No blood noted in stools.  No fevers. Patient is afebrile, non-toxic appearing, sitting comfortably on examination table. Vital signs reviewed and stable.  Abdomen is soft, nondistended, nontender.  Consider continued viral process.  Initial labs ordered at triage. IVF given for fluid resuscitation.  CBC shows slight leukocytosis at 11.2.  Lipase is unremarkable.  CMP shows BUN is 30 and creatinine is 1.93.  This is increased from CMP that was done 3 days ago which showed BUN and 25 and 1.58 creatinine. AST and ALT are slightly elevated but consistent with previous.  UA negative for ketones, infectious etiology.  I discussed results with patient.  I offered the option of admission for IV hydration and observation.  Patient does not wish to be admitted at this time.  We will plan to give a repeat liter of fluid and reassess.  Reevaluation after 2 L of fluid.  Patient reports improvement.  Abdomen is soft, nondistended, nontender.  Patient has not had any diarrhea since being here in the ED.  We will plan to p.o. challenge patient.  Patient able to tolerate p.o. in the department without any difficulty.  Repeat abdominal exam is benign.  No tenderness, rigidity, guarding.  I again offered admission  for IV fluid resuscitation and observation.  Patient does not wish to be admitted at this time.  Vital signs are stable.  Abdominal exam is benign.  Discussed patient with Dr. Rodena MedinMessick who is agreeable to plan.  Patient instructed to follow-up with primary care doctor in the next 2-4 days for further evaluation. Patient had ample opportunity for questions and discussion. All patient's questions were answered with full understanding. Strict return precautions discussed. Patient expresses understanding and agreement to plan.   Final Clinical Impressions(s) / ED Diagnoses   Final diagnoses:  Diarrhea, unspecified type    ED Discharge Orders    None       Rosana HoesLayden, Idriss Quackenbush A, PA-C 03/24/18 1520    Wynetta FinesMessick, Peter C, MD 03/24/18 [phone]

## 2018-04-05 DIAGNOSIS — I10 Essential (primary) hypertension: Secondary | ICD-10-CM | POA: Diagnosis not present

## 2018-04-05 DIAGNOSIS — J4520 Mild intermittent asthma, uncomplicated: Secondary | ICD-10-CM | POA: Diagnosis not present

## 2018-04-05 DIAGNOSIS — I2510 Atherosclerotic heart disease of native coronary artery without angina pectoris: Secondary | ICD-10-CM | POA: Diagnosis not present

## 2018-04-05 DIAGNOSIS — E1321 Other specified diabetes mellitus with diabetic nephropathy: Secondary | ICD-10-CM | POA: Diagnosis not present

## 2018-04-05 DIAGNOSIS — Z1211 Encounter for screening for malignant neoplasm of colon: Secondary | ICD-10-CM | POA: Diagnosis not present

## 2018-05-12 ENCOUNTER — Emergency Department (HOSPITAL_COMMUNITY): Payer: Medicare HMO

## 2018-05-12 ENCOUNTER — Emergency Department (HOSPITAL_COMMUNITY)
Admission: EM | Admit: 2018-05-12 | Discharge: 2018-05-12 | Disposition: A | Discharge status: Home / Self Care | Payer: Medicare HMO | Attending: Emergency Medicine | Admitting: Emergency Medicine

## 2018-05-12 ENCOUNTER — Other Ambulatory Visit: Payer: Self-pay

## 2018-05-12 ENCOUNTER — Encounter (HOSPITAL_COMMUNITY): Payer: Self-pay

## 2018-05-12 DIAGNOSIS — Z794 Long term (current) use of insulin: Secondary | ICD-10-CM | POA: Insufficient documentation

## 2018-05-12 DIAGNOSIS — R0789 Other chest pain: Secondary | ICD-10-CM | POA: Insufficient documentation

## 2018-05-12 DIAGNOSIS — Z951 Presence of aortocoronary bypass graft: Secondary | ICD-10-CM | POA: Insufficient documentation

## 2018-05-12 DIAGNOSIS — M25512 Pain in left shoulder: Secondary | ICD-10-CM | POA: Insufficient documentation

## 2018-05-12 DIAGNOSIS — I2510 Atherosclerotic heart disease of native coronary artery without angina pectoris: Secondary | ICD-10-CM | POA: Diagnosis not present

## 2018-05-12 DIAGNOSIS — Y999 Unspecified external cause status: Secondary | ICD-10-CM | POA: Diagnosis not present

## 2018-05-12 DIAGNOSIS — M79671 Pain in right foot: Secondary | ICD-10-CM | POA: Diagnosis not present

## 2018-05-12 DIAGNOSIS — M542 Cervicalgia: Secondary | ICD-10-CM | POA: Diagnosis not present

## 2018-05-12 DIAGNOSIS — Z7982 Long term (current) use of aspirin: Secondary | ICD-10-CM | POA: Diagnosis not present

## 2018-05-12 DIAGNOSIS — N183 Chronic kidney disease, stage 3 (moderate): Secondary | ICD-10-CM | POA: Insufficient documentation

## 2018-05-12 DIAGNOSIS — J45909 Unspecified asthma, uncomplicated: Secondary | ICD-10-CM | POA: Insufficient documentation

## 2018-05-12 DIAGNOSIS — R1084 Generalized abdominal pain: Secondary | ICD-10-CM | POA: Insufficient documentation

## 2018-05-12 DIAGNOSIS — R109 Unspecified abdominal pain: Secondary | ICD-10-CM | POA: Diagnosis not present

## 2018-05-12 DIAGNOSIS — Y939 Activity, unspecified: Secondary | ICD-10-CM | POA: Insufficient documentation

## 2018-05-12 DIAGNOSIS — S199XXA Unspecified injury of neck, initial encounter: Secondary | ICD-10-CM | POA: Diagnosis not present

## 2018-05-12 DIAGNOSIS — I129 Hypertensive chronic kidney disease with stage 1 through stage 4 chronic kidney disease, or unspecified chronic kidney disease: Secondary | ICD-10-CM | POA: Insufficient documentation

## 2018-05-12 DIAGNOSIS — M5489 Other dorsalgia: Secondary | ICD-10-CM | POA: Diagnosis not present

## 2018-05-12 DIAGNOSIS — Y92410 Unspecified street and highway as the place of occurrence of the external cause: Secondary | ICD-10-CM | POA: Insufficient documentation

## 2018-05-12 DIAGNOSIS — R079 Chest pain, unspecified: Secondary | ICD-10-CM | POA: Diagnosis not present

## 2018-05-12 DIAGNOSIS — S99911A Unspecified injury of right ankle, initial encounter: Secondary | ICD-10-CM | POA: Diagnosis not present

## 2018-05-12 DIAGNOSIS — S3991XA Unspecified injury of abdomen, initial encounter: Secondary | ICD-10-CM | POA: Diagnosis not present

## 2018-05-12 DIAGNOSIS — M25571 Pain in right ankle and joints of right foot: Secondary | ICD-10-CM | POA: Diagnosis not present

## 2018-05-12 DIAGNOSIS — E1122 Type 2 diabetes mellitus with diabetic chronic kidney disease: Secondary | ICD-10-CM | POA: Diagnosis not present

## 2018-05-12 DIAGNOSIS — Z79899 Other long term (current) drug therapy: Secondary | ICD-10-CM | POA: Diagnosis not present

## 2018-05-12 DIAGNOSIS — S0990XA Unspecified injury of head, initial encounter: Secondary | ICD-10-CM | POA: Diagnosis not present

## 2018-05-12 DIAGNOSIS — S3993XA Unspecified injury of pelvis, initial encounter: Secondary | ICD-10-CM | POA: Diagnosis not present

## 2018-05-12 DIAGNOSIS — T148XXA Other injury of unspecified body region, initial encounter: Secondary | ICD-10-CM | POA: Insufficient documentation

## 2018-05-12 DIAGNOSIS — S99921A Unspecified injury of right foot, initial encounter: Secondary | ICD-10-CM | POA: Diagnosis not present

## 2018-05-12 DIAGNOSIS — S4992XA Unspecified injury of left shoulder and upper arm, initial encounter: Secondary | ICD-10-CM | POA: Diagnosis not present

## 2018-05-12 DIAGNOSIS — S299XXA Unspecified injury of thorax, initial encounter: Secondary | ICD-10-CM | POA: Diagnosis not present

## 2018-05-12 DIAGNOSIS — R52 Pain, unspecified: Secondary | ICD-10-CM | POA: Diagnosis not present

## 2018-05-12 LAB — I-STAT CHEM 8, ED
BUN: 30 mg/dL — ABNORMAL HIGH (ref 6–20)
CALCIUM ION: 1.19 mmol/L (ref 1.15–1.40)
CREATININE: 1.5 mg/dL — AB (ref 0.44–1.00)
Chloride: 106 mmol/L (ref 101–111)
GLUCOSE: 215 mg/dL — AB (ref 65–99)
HCT: 37 % (ref 36.0–46.0)
HEMOGLOBIN: 12.6 g/dL (ref 12.0–15.0)
Potassium: 3.6 mmol/L (ref 3.5–5.1)
Sodium: 142 mmol/L (ref 135–145)
TCO2: 22 mmol/L (ref 22–32)

## 2018-05-12 MED ORDER — TRAMADOL HCL 50 MG PO TABS: 50.0000 mg | ORAL_TABLET | Freq: Four times a day (QID) | ORAL | 0 refills | Status: DC | PRN

## 2018-05-12 MED ORDER — IOHEXOL 300 MG/ML  SOLN
75.0000 mL | Freq: Once | INTRAMUSCULAR | Status: AC | PRN
  Administered 2018-05-12: 75 mL via INTRAVENOUS

## 2018-05-12 MED ORDER — IOPAMIDOL (ISOVUE-300) INJECTION 61%: 80.0000 mL | Freq: Once | INTRAVENOUS | Status: DC | PRN
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
## 2018-05-12 NOTE — Discharge Instructions (Addendum)
Follow-up with your doctor next week if any problems 

## 2018-05-12 NOTE — ED Notes (Signed)
CT brought pt back at this time and needs IV started for the rest of her scans ordered. CT attempted unsuccessful x4.
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
## 2018-05-26 ENCOUNTER — Ambulatory Visit: Payer: Medicare HMO | Admitting: Cardiology

## 2018-05-26 DIAGNOSIS — R0989 Other specified symptoms and signs involving the circulatory and respiratory systems: Secondary | ICD-10-CM

## 2018-05-27 ENCOUNTER — Encounter: Payer: Self-pay | Admitting: *Deleted

## 2018-06-14 ENCOUNTER — Emergency Department (HOSPITAL_COMMUNITY): Payer: Medicare HMO

## 2018-06-14 ENCOUNTER — Emergency Department (HOSPITAL_COMMUNITY)
Admission: EM | Admit: 2018-06-14 | Discharge: 2018-06-14 | Disposition: A | Discharge status: Home / Self Care | Payer: Medicare HMO | Attending: Emergency Medicine | Admitting: Emergency Medicine

## 2018-06-14 ENCOUNTER — Other Ambulatory Visit: Payer: Self-pay

## 2018-06-14 ENCOUNTER — Encounter (HOSPITAL_COMMUNITY): Payer: Self-pay | Admitting: Emergency Medicine

## 2018-06-14 ENCOUNTER — Telehealth: Payer: Self-pay

## 2018-06-14 DIAGNOSIS — Z79899 Other long term (current) drug therapy: Secondary | ICD-10-CM | POA: Diagnosis not present

## 2018-06-14 DIAGNOSIS — Z794 Long term (current) use of insulin: Secondary | ICD-10-CM | POA: Diagnosis not present

## 2018-06-14 DIAGNOSIS — J45909 Unspecified asthma, uncomplicated: Secondary | ICD-10-CM | POA: Insufficient documentation

## 2018-06-14 DIAGNOSIS — M5410 Radiculopathy, site unspecified: Secondary | ICD-10-CM | POA: Insufficient documentation

## 2018-06-14 DIAGNOSIS — Z951 Presence of aortocoronary bypass graft: Secondary | ICD-10-CM | POA: Insufficient documentation

## 2018-06-14 DIAGNOSIS — I2510 Atherosclerotic heart disease of native coronary artery without angina pectoris: Secondary | ICD-10-CM | POA: Insufficient documentation

## 2018-06-14 DIAGNOSIS — Z7982 Long term (current) use of aspirin: Secondary | ICD-10-CM | POA: Diagnosis not present

## 2018-06-14 DIAGNOSIS — R202 Paresthesia of skin: Secondary | ICD-10-CM | POA: Diagnosis not present

## 2018-06-14 DIAGNOSIS — I129 Hypertensive chronic kidney disease with stage 1 through stage 4 chronic kidney disease, or unspecified chronic kidney disease: Secondary | ICD-10-CM | POA: Diagnosis not present

## 2018-06-14 DIAGNOSIS — N183 Chronic kidney disease, stage 3 (moderate): Secondary | ICD-10-CM | POA: Insufficient documentation

## 2018-06-14 DIAGNOSIS — M545 Low back pain: Secondary | ICD-10-CM | POA: Diagnosis not present

## 2018-06-14 DIAGNOSIS — E1122 Type 2 diabetes mellitus with diabetic chronic kidney disease: Secondary | ICD-10-CM | POA: Diagnosis not present

## 2018-06-14 DIAGNOSIS — M79605 Pain in left leg: Secondary | ICD-10-CM | POA: Diagnosis not present

## 2018-06-14 LAB — URINALYSIS, ROUTINE W REFLEX MICROSCOPIC
BACTERIA UA: NONE SEEN
BILIRUBIN URINE: NEGATIVE
Glucose, UA: NEGATIVE mg/dL
Hgb urine dipstick: NEGATIVE
KETONES UR: NEGATIVE mg/dL
Nitrite: NEGATIVE
PROTEIN: NEGATIVE mg/dL
SPECIFIC GRAVITY, URINE: 1.016 (ref 1.005–1.030)
pH: 6 (ref 5.0–8.0)

## 2018-06-14 MED ORDER — TRAMADOL HCL 50 MG PO TABS: 50.0000 mg | ORAL_TABLET | Freq: Four times a day (QID) | ORAL | 0 refills | Status: DC | PRN

## 2018-06-14 MED ORDER — OXYCODONE-ACETAMINOPHEN 5-325 MG PO TABS
1.0000 | ORAL_TABLET | Freq: Once | ORAL | Status: AC
  Administered 2018-06-14: 1 via ORAL
  Filled 2018-06-14: qty 1

## 2018-06-14 MED ORDER — ACETAMINOPHEN 500 MG PO TABS: 1000.0000 mg | ORAL_TABLET | Freq: Four times a day (QID) | ORAL | 0 refills | Status: DC | PRN

## 2018-06-14 NOTE — ED Notes (Signed)
Patient transported to X-ray 

## 2018-06-14 NOTE — Discharge Instructions (Signed)
Given your symptoms and physical exam findings, I suspect you have muscular pain from an inflamed sciatic nerve on the left.    We will treat your inflammation with the following medication regimen: Acetaminophen (Tylenol) 1000 mg every 6 hours for the next 5 days Tramadol 50 mg every 6 hours for break through or severe pain Heating pad as needed Over the counter lidocaine patches (salonpas) can be helpful  Avoid any exacerbating activities for the next 48 hours.  After 48 hours, start doing light back range of motion exercises and walking to avoid worsening back stiffness.   Return for fevers, chills, abdominal pain, changes in bowel movement, urinary symptoms, groin numbness, loss of bladder or bowel control, numbness weakness or heaviness to your extremities, rash.

## 2018-06-14 NOTE — Telephone Encounter (Signed)
[facility].  She has never been seen here.  I advised her to schedule it with her PCP and call Medicare to have her PCP updated. VDM (DD)
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
## 2018-06-14 NOTE — ED Triage Notes (Signed)
Pt reports L lower back pain with radiation down L leg since yesterday. Pt denies injury. [facility] and thinks the pain is related to that.[name] Kitchen..[name] Kitchen

## 2018-06-16 LAB — URINE CULTURE

## 2018-06-18 ENCOUNTER — Inpatient Hospital Stay (HOSPITAL_COMMUNITY)
Admission: EM | Admit: 2018-06-18 | Discharge: 2018-06-20 | DRG: 554 | Disposition: A | Discharge status: Home Health | Payer: Medicare HMO | Attending: Family Medicine | Admitting: Family Medicine

## 2018-06-18 ENCOUNTER — Emergency Department (HOSPITAL_COMMUNITY): Payer: Medicare HMO

## 2018-06-18 ENCOUNTER — Emergency Department (HOSPITAL_BASED_OUTPATIENT_CLINIC_OR_DEPARTMENT_OTHER): Payer: Medicare HMO

## 2018-06-18 ENCOUNTER — Other Ambulatory Visit: Payer: Self-pay

## 2018-06-18 ENCOUNTER — Encounter (HOSPITAL_COMMUNITY): Payer: Self-pay | Admitting: Emergency Medicine

## 2018-06-18 DIAGNOSIS — E785 Hyperlipidemia, unspecified: Secondary | ICD-10-CM | POA: Diagnosis present

## 2018-06-18 DIAGNOSIS — N183 Chronic kidney disease, stage 3 unspecified: Secondary | ICD-10-CM | POA: Diagnosis present

## 2018-06-18 DIAGNOSIS — M25562 Pain in left knee: Secondary | ICD-10-CM

## 2018-06-18 DIAGNOSIS — S6991XA Unspecified injury of right wrist, hand and finger(s), initial encounter: Secondary | ICD-10-CM | POA: Diagnosis not present

## 2018-06-18 DIAGNOSIS — E669 Obesity, unspecified: Secondary | ICD-10-CM | POA: Diagnosis present

## 2018-06-18 DIAGNOSIS — M25462 Effusion, left knee: Secondary | ICD-10-CM | POA: Diagnosis not present

## 2018-06-18 DIAGNOSIS — M7989 Other specified soft tissue disorders: Secondary | ICD-10-CM | POA: Diagnosis not present

## 2018-06-18 DIAGNOSIS — Z951 Presence of aortocoronary bypass graft: Secondary | ICD-10-CM | POA: Diagnosis not present

## 2018-06-18 DIAGNOSIS — I129 Hypertensive chronic kidney disease with stage 1 through stage 4 chronic kidney disease, or unspecified chronic kidney disease: Secondary | ICD-10-CM | POA: Diagnosis present

## 2018-06-18 DIAGNOSIS — M549 Dorsalgia, unspecified: Secondary | ICD-10-CM | POA: Diagnosis present

## 2018-06-18 DIAGNOSIS — Z888 Allergy status to other drugs, medicaments and biological substances status: Secondary | ICD-10-CM

## 2018-06-18 DIAGNOSIS — Z90710 Acquired absence of both cervix and uterus: Secondary | ICD-10-CM | POA: Diagnosis not present

## 2018-06-18 DIAGNOSIS — E8881 Metabolic syndrome: Secondary | ICD-10-CM | POA: Diagnosis present

## 2018-06-18 DIAGNOSIS — Z7982 Long term (current) use of aspirin: Secondary | ICD-10-CM

## 2018-06-18 DIAGNOSIS — Z7984 Long term (current) use of oral hypoglycemic drugs: Secondary | ICD-10-CM

## 2018-06-18 DIAGNOSIS — J45909 Unspecified asthma, uncomplicated: Secondary | ICD-10-CM | POA: Diagnosis present

## 2018-06-18 DIAGNOSIS — M79605 Pain in left leg: Secondary | ICD-10-CM | POA: Diagnosis present

## 2018-06-18 DIAGNOSIS — M10362 Gout due to renal impairment, left knee: Secondary | ICD-10-CM | POA: Diagnosis not present

## 2018-06-18 DIAGNOSIS — M109 Gout, unspecified: Secondary | ICD-10-CM | POA: Diagnosis present

## 2018-06-18 DIAGNOSIS — M1712 Unilateral primary osteoarthritis, left knee: Secondary | ICD-10-CM | POA: Diagnosis present

## 2018-06-18 DIAGNOSIS — Z7951 Long term (current) use of inhaled steroids: Secondary | ICD-10-CM | POA: Diagnosis not present

## 2018-06-18 DIAGNOSIS — I2510 Atherosclerotic heart disease of native coronary artery without angina pectoris: Secondary | ICD-10-CM | POA: Diagnosis present

## 2018-06-18 DIAGNOSIS — E1122 Type 2 diabetes mellitus with diabetic chronic kidney disease: Secondary | ICD-10-CM | POA: Diagnosis present

## 2018-06-18 DIAGNOSIS — Z800 Family history of malignant neoplasm of digestive organs: Secondary | ICD-10-CM | POA: Diagnosis not present

## 2018-06-18 DIAGNOSIS — E1169 Type 2 diabetes mellitus with other specified complication: Secondary | ICD-10-CM | POA: Diagnosis present

## 2018-06-18 DIAGNOSIS — Z823 Family history of stroke: Secondary | ICD-10-CM | POA: Diagnosis not present

## 2018-06-18 DIAGNOSIS — D72829 Elevated white blood cell count, unspecified: Secondary | ICD-10-CM | POA: Diagnosis present

## 2018-06-18 DIAGNOSIS — Z6834 Body mass index (BMI) 34.0-34.9, adult: Secondary | ICD-10-CM

## 2018-06-18 DIAGNOSIS — N179 Acute kidney failure, unspecified: Secondary | ICD-10-CM | POA: Diagnosis present

## 2018-06-18 DIAGNOSIS — Z887 Allergy status to serum and vaccine status: Secondary | ICD-10-CM

## 2018-06-18 DIAGNOSIS — Z8249 Family history of ischemic heart disease and other diseases of the circulatory system: Secondary | ICD-10-CM | POA: Diagnosis not present

## 2018-06-18 DIAGNOSIS — E1165 Type 2 diabetes mellitus with hyperglycemia: Secondary | ICD-10-CM | POA: Diagnosis present

## 2018-06-18 DIAGNOSIS — M2550 Pain in unspecified joint: Secondary | ICD-10-CM | POA: Diagnosis present

## 2018-06-18 DIAGNOSIS — E860 Dehydration: Secondary | ICD-10-CM | POA: Diagnosis present

## 2018-06-18 DIAGNOSIS — D72825 Bandemia: Secondary | ICD-10-CM

## 2018-06-18 DIAGNOSIS — IMO0002 Reserved for concepts with insufficient information to code with codable children: Secondary | ICD-10-CM | POA: Diagnosis present

## 2018-06-18 DIAGNOSIS — I252 Old myocardial infarction: Secondary | ICD-10-CM

## 2018-06-18 DIAGNOSIS — R079 Chest pain, unspecified: Secondary | ICD-10-CM | POA: Diagnosis not present

## 2018-06-18 DIAGNOSIS — I25119 Atherosclerotic heart disease of native coronary artery with unspecified angina pectoris: Secondary | ICD-10-CM | POA: Diagnosis not present

## 2018-06-18 DIAGNOSIS — M79609 Pain in unspecified limb: Secondary | ICD-10-CM

## 2018-06-18 DIAGNOSIS — S3992XA Unspecified injury of lower back, initial encounter: Secondary | ICD-10-CM | POA: Diagnosis not present

## 2018-06-18 DIAGNOSIS — T380X5A Adverse effect of glucocorticoids and synthetic analogues, initial encounter: Secondary | ICD-10-CM | POA: Diagnosis not present

## 2018-06-18 DIAGNOSIS — M25531 Pain in right wrist: Secondary | ICD-10-CM | POA: Diagnosis not present

## 2018-06-18 DIAGNOSIS — Z91018 Allergy to other foods: Secondary | ICD-10-CM

## 2018-06-18 DIAGNOSIS — J4520 Mild intermittent asthma, uncomplicated: Secondary | ICD-10-CM | POA: Diagnosis not present

## 2018-06-18 DIAGNOSIS — M545 Low back pain: Secondary | ICD-10-CM | POA: Diagnosis not present

## 2018-06-18 LAB — CBC WITH DIFFERENTIAL/PLATELET
Abs Immature Granulocytes: 0.1 10*3/uL (ref 0.0–0.1)
BASOS ABS: 0.1 10*3/uL (ref 0.0–0.1)
Basophils Relative: 0 %
EOS ABS: 0 10*3/uL (ref 0.0–0.7)
EOS PCT: 0 %
HCT: 40.4 % (ref 36.0–46.0)
HEMOGLOBIN: 12.9 g/dL (ref 12.0–15.0)
Immature Granulocytes: 1 %
LYMPHS PCT: 10 %
Lymphs Abs: 1.7 10*3/uL (ref 0.7–4.0)
MCH: 32 pg (ref 26.0–34.0)
MCHC: 31.9 g/dL (ref 30.0–36.0)
MCV: 100.2 fL — ABNORMAL HIGH (ref 78.0–100.0)
MONO ABS: 1.9 10*3/uL — AB (ref 0.1–1.0)
Monocytes Relative: 11 %
Neutro Abs: 13.1 10*3/uL — ABNORMAL HIGH (ref 1.7–7.7)
Neutrophils Relative %: 78 %
Platelets: 251 10*3/uL (ref 150–400)
RBC: 4.03 MIL/uL (ref 3.87–5.11)
RDW: 12.5 % (ref 11.5–15.5)
WBC: 16.9 10*3/uL — ABNORMAL HIGH (ref 4.0–10.5)

## 2018-06-18 LAB — C-REACTIVE PROTEIN: CRP: 43.1 mg/dL — AB (ref <1.0)

## 2018-06-18 LAB — GLUCOSE, CAPILLARY
GLUCOSE-CAPILLARY: 210 mg/dL — AB (ref 70–99)
Glucose-Capillary: 195 mg/dL — ABNORMAL HIGH (ref 70–99)
Glucose-Capillary: 259 mg/dL — ABNORMAL HIGH (ref 70–99)

## 2018-06-18 LAB — SYNOVIAL CELL COUNT + DIFF, W/ CRYSTALS
EOSINOPHILS-SYNOVIAL: 0 % (ref 0–1)
LYMPHOCYTES-SYNOVIAL FLD: 0 % (ref 0–20)
Monocyte-Macrophage-Synovial Fluid: 3 % — ABNORMAL LOW (ref 50–90)
Neutrophil, Synovial: 97 % — ABNORMAL HIGH (ref 0–25)
WBC, SYNOVIAL: 13050 /mm3 — AB (ref 0–200)

## 2018-06-18 LAB — BASIC METABOLIC PANEL
Anion gap: 14 (ref 5–15)
BUN: 31 mg/dL — AB (ref 8–23)
CHLORIDE: 96 mmol/L — AB (ref 98–111)
CO2: 24 mmol/L (ref 22–32)
CREATININE: 2 mg/dL — AB (ref 0.44–1.00)
Calcium: 9.3 mg/dL (ref 8.9–10.3)
GFR calc Af Amer: 28 mL/min — ABNORMAL LOW (ref >60)
GFR calc non Af Amer: 24 mL/min — ABNORMAL LOW (ref >60)
Glucose, Bld: 225 mg/dL — ABNORMAL HIGH (ref 70–99)
Potassium: 4.3 mmol/L (ref 3.5–5.1)
SODIUM: 134 mmol/L — AB (ref 135–145)

## 2018-06-18 LAB — TROPONIN I: Troponin I: <0.03 ng/mL (ref <0.03)

## 2018-06-18 LAB — URIC ACID: Uric Acid, Serum: 11.8 mg/dL — ABNORMAL HIGH (ref 2.5–7.1)

## 2018-06-18 LAB — CK: CK TOTAL: 158 U/L (ref 38–234)

## 2018-06-18 LAB — SEDIMENTATION RATE: SED RATE: 116 mm/h — AB (ref 0–22)

## 2018-06-18 MED ORDER — ACETAMINOPHEN 650 MG RE SUPP: 650.0000 mg | Freq: Four times a day (QID) | RECTAL | Status: DC | PRN

## 2018-06-18 MED ORDER — SIMVASTATIN 40 MG PO TABS
40.0000 mg | ORAL_TABLET | Freq: Every day | ORAL | Status: DC
  Administered 2018-06-18 – 2018-06-19 (×2): 40 mg via ORAL
  Filled 2018-06-18 (×2): qty 1

## 2018-06-18 MED ORDER — LORAZEPAM 2 MG/ML IJ SOLN
1.0000 mg | Freq: Once | INTRAMUSCULAR | Status: AC
  Administered 2018-06-18: 1 mg via INTRAVENOUS
  Filled 2018-06-18: qty 1

## 2018-06-18 MED ORDER — MOMETASONE FURO-FORMOTEROL FUM 100-5 MCG/ACT IN AERO
2.0000 | INHALATION_SPRAY | Freq: Two times a day (BID) | RESPIRATORY_TRACT | Status: DC
  Administered 2018-06-19 – 2018-06-20 (×3): 2 via RESPIRATORY_TRACT
  Filled 2018-06-18: qty 8.8

## 2018-06-18 MED ORDER — ONDANSETRON HCL 4 MG PO TABS
4.0000 mg | ORAL_TABLET | Freq: Four times a day (QID) | ORAL | Status: DC | PRN
Start: 2018-06-18 — End: 2018-06-20

## 2018-06-18 MED ORDER — HYDROCODONE-ACETAMINOPHEN 5-325 MG PO TABS
1.0000 | ORAL_TABLET | ORAL | Status: DC | PRN
  Administered 2018-06-18 – 2018-06-19 (×4): 2 via ORAL
  Filled 2018-06-18 (×4): qty 2

## 2018-06-18 MED ORDER — OXYCODONE-ACETAMINOPHEN 5-325 MG PO TABS
1.0000 | ORAL_TABLET | Freq: Once | ORAL | Status: AC
  Administered 2018-06-18: 1 via ORAL
  Filled 2018-06-18: qty 1

## 2018-06-18 MED ORDER — CYCLOSPORINE 0.05 % OP EMUL
1.0000 [drp] | Freq: Two times a day (BID) | OPHTHALMIC | Status: DC
  Administered 2018-06-18 – 2018-06-20 (×4): 1 [drp] via OPHTHALMIC
  Filled 2018-06-18 (×5): qty 1

## 2018-06-18 MED ORDER — SODIUM CHLORIDE 0.9 % IV SOLN: 250.0000 mL | INTRAVENOUS | Status: DC | PRN

## 2018-06-18 MED ORDER — COLCHICINE 0.6 MG PO TABS
0.6000 mg | ORAL_TABLET | Freq: Two times a day (BID) | ORAL | Status: DC
  Administered 2018-06-18 – 2018-06-19 (×2): 0.6 mg via ORAL
  Filled 2018-06-18 (×2): qty 1

## 2018-06-18 MED ORDER — INSULIN GLARGINE 100 UNIT/ML ~~LOC~~ SOLN
50.0000 [IU] | Freq: Every day | SUBCUTANEOUS | Status: DC
  Administered 2018-06-18 – 2018-06-19 (×2): 50 [IU] via SUBCUTANEOUS
  Filled 2018-06-18 (×3): qty 0.5

## 2018-06-18 MED ORDER — ACETAMINOPHEN 325 MG PO TABS: 650.0000 mg | ORAL_TABLET | Freq: Four times a day (QID) | ORAL | Status: DC | PRN

## 2018-06-18 MED ORDER — INSULIN ASPART 100 UNIT/ML ~~LOC~~ SOLN
0.0000 [IU] | Freq: Every day | SUBCUTANEOUS | Status: DC
  Administered 2018-06-18: 3 [IU] via SUBCUTANEOUS
  Administered 2018-06-19: 5 [IU] via SUBCUTANEOUS

## 2018-06-18 MED ORDER — SODIUM CHLORIDE 0.9% FLUSH
3.0000 mL | Freq: Two times a day (BID) | INTRAVENOUS | Status: DC
  Administered 2018-06-18: 3 mL via INTRAVENOUS

## 2018-06-18 MED ORDER — ONDANSETRON HCL 4 MG/2ML IJ SOLN
4.0000 mg | Freq: Four times a day (QID) | INTRAMUSCULAR | Status: DC | PRN
Start: 2018-06-18 — End: 2018-06-20

## 2018-06-18 MED ORDER — PREDNISONE 20 MG PO TABS
40.0000 mg | ORAL_TABLET | Freq: Every day | ORAL | Status: DC
  Administered 2018-06-18 – 2018-06-19 (×2): 40 mg via ORAL
  Filled 2018-06-18 (×2): qty 2

## 2018-06-18 MED ORDER — DIAZEPAM 5 MG PO TABS
5.0000 mg | ORAL_TABLET | Freq: Once | ORAL | Status: AC
  Administered 2018-06-18: 5 mg via ORAL
  Filled 2018-06-18: qty 1

## 2018-06-18 MED ORDER — TRAMADOL HCL 50 MG PO TABS: 100.0000 mg | ORAL_TABLET | Freq: Two times a day (BID) | ORAL | Status: DC | PRN

## 2018-06-18 MED ORDER — INSULIN ASPART 100 UNIT/ML ~~LOC~~ SOLN
0.0000 [IU] | Freq: Three times a day (TID) | SUBCUTANEOUS | Status: DC
  Administered 2018-06-18: 3 [IU] via SUBCUTANEOUS
  Administered 2018-06-19: 8 [IU] via SUBCUTANEOUS
  Administered 2018-06-19: 15 [IU] via SUBCUTANEOUS
  Administered 2018-06-19: 5 [IU] via SUBCUTANEOUS

## 2018-06-18 MED ORDER — METOPROLOL TARTRATE 25 MG PO TABS
25.0000 mg | ORAL_TABLET | Freq: Two times a day (BID) | ORAL | Status: DC
Start: 2018-06-18 — End: 2018-06-20
  Administered 2018-06-18 – 2018-06-20 (×4): 25 mg via ORAL
  Filled 2018-06-18 (×4): qty 1

## 2018-06-18 MED ORDER — SODIUM CHLORIDE 0.9% FLUSH: 3.0000 mL | INTRAVENOUS | Status: DC | PRN

## 2018-06-18 NOTE — ED Triage Notes (Signed)
Pt reports left leg pain that radiates down to her toes, states she was diagnosed with sciatica on 7/2 but pain meds are not helping
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
## 2018-06-18 NOTE — Progress Notes (Signed)
New Admission Note:   Arrival Method: stretcher Mental Orientation: alert and oriented Telemetry: 52M21 Assessment: completed Skin: assessed by 2 RNs IV: R FA NSL Pain: 8/10 L knee Tubes: n/a Safety Measures: moderate fall risk Admission: completed 50M Orientation: done Family: 2 granddaughters at bedside Personal Belongings: clothing, cell phone  Orders have been reviewed and implemented. Will continue to monitor the patient. Call light has been placed within reach and bed alarm has been activated.   Hortencia ConradiWendi Ricardo Schubach, RN MSN C[id number] Phone number: [phone]

## 2018-06-18 NOTE — Progress Notes (Signed)
Left lower extremity venous duplex completed. There is no evidence of DVT or Baker's cyst. Toma DeitersVirginia Bernie Fobes, RVS  06/18/2018 3:07 PM

## 2018-06-18 NOTE — ED Notes (Signed)
This RN attempted IV x 2.  Raynelle FanningJulie, RN attempted IV x 1.  Additional RN to attempt US IV

## 2018-06-18 NOTE — ED Notes (Signed)
Pt unable to urinate for urinalysis. Will ring when able.
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
## 2018-06-18 NOTE — ED Notes (Addendum)
Albin Fellingarla, RN attempted IV with US without success.  Saa, phlebotomist at bedside for blood work.  Will place IV Team consult

## 2018-06-18 NOTE — ED Notes (Signed)
Patient transported to MRI 
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
## 2018-06-18 NOTE — Progress Notes (Signed)
Patient ID: [name], female   DOB: 1951/02/02, 67 y.o.   MRN: [id number] The left knee aspiration is consistent with gout. 13,000 WBC and crystals.

## 2018-06-19 DIAGNOSIS — M10362 Gout due to renal impairment, left knee: Secondary | ICD-10-CM | POA: Diagnosis not present

## 2018-06-19 DIAGNOSIS — E669 Obesity, unspecified: Secondary | ICD-10-CM | POA: Diagnosis present

## 2018-06-19 DIAGNOSIS — Z823 Family history of stroke: Secondary | ICD-10-CM | POA: Diagnosis not present

## 2018-06-19 DIAGNOSIS — M109 Gout, unspecified: Secondary | ICD-10-CM | POA: Diagnosis present

## 2018-06-19 DIAGNOSIS — J4520 Mild intermittent asthma, uncomplicated: Secondary | ICD-10-CM

## 2018-06-19 DIAGNOSIS — E1165 Type 2 diabetes mellitus with hyperglycemia: Secondary | ICD-10-CM | POA: Diagnosis not present

## 2018-06-19 DIAGNOSIS — E1122 Type 2 diabetes mellitus with diabetic chronic kidney disease: Secondary | ICD-10-CM | POA: Diagnosis present

## 2018-06-19 DIAGNOSIS — I252 Old myocardial infarction: Secondary | ICD-10-CM | POA: Diagnosis not present

## 2018-06-19 DIAGNOSIS — Z7982 Long term (current) use of aspirin: Secondary | ICD-10-CM | POA: Diagnosis not present

## 2018-06-19 DIAGNOSIS — J45909 Unspecified asthma, uncomplicated: Secondary | ICD-10-CM | POA: Diagnosis present

## 2018-06-19 DIAGNOSIS — Z7951 Long term (current) use of inhaled steroids: Secondary | ICD-10-CM | POA: Diagnosis not present

## 2018-06-19 DIAGNOSIS — M549 Dorsalgia, unspecified: Secondary | ICD-10-CM | POA: Diagnosis present

## 2018-06-19 DIAGNOSIS — Z951 Presence of aortocoronary bypass graft: Secondary | ICD-10-CM | POA: Diagnosis not present

## 2018-06-19 DIAGNOSIS — E8881 Metabolic syndrome: Secondary | ICD-10-CM | POA: Diagnosis present

## 2018-06-19 DIAGNOSIS — N183 Chronic kidney disease, stage 3 (moderate): Secondary | ICD-10-CM

## 2018-06-19 DIAGNOSIS — Z800 Family history of malignant neoplasm of digestive organs: Secondary | ICD-10-CM | POA: Diagnosis not present

## 2018-06-19 DIAGNOSIS — M25462 Effusion, left knee: Secondary | ICD-10-CM | POA: Diagnosis not present

## 2018-06-19 DIAGNOSIS — E785 Hyperlipidemia, unspecified: Secondary | ICD-10-CM | POA: Diagnosis present

## 2018-06-19 DIAGNOSIS — Z90710 Acquired absence of both cervix and uterus: Secondary | ICD-10-CM | POA: Diagnosis not present

## 2018-06-19 DIAGNOSIS — Z8249 Family history of ischemic heart disease and other diseases of the circulatory system: Secondary | ICD-10-CM | POA: Diagnosis not present

## 2018-06-19 DIAGNOSIS — I25119 Atherosclerotic heart disease of native coronary artery with unspecified angina pectoris: Secondary | ICD-10-CM

## 2018-06-19 DIAGNOSIS — D72829 Elevated white blood cell count, unspecified: Secondary | ICD-10-CM | POA: Diagnosis present

## 2018-06-19 DIAGNOSIS — Z91018 Allergy to other foods: Secondary | ICD-10-CM | POA: Diagnosis not present

## 2018-06-19 DIAGNOSIS — M1712 Unilateral primary osteoarthritis, left knee: Secondary | ICD-10-CM | POA: Diagnosis present

## 2018-06-19 DIAGNOSIS — I129 Hypertensive chronic kidney disease with stage 1 through stage 4 chronic kidney disease, or unspecified chronic kidney disease: Secondary | ICD-10-CM | POA: Diagnosis present

## 2018-06-19 DIAGNOSIS — I2510 Atherosclerotic heart disease of native coronary artery without angina pectoris: Secondary | ICD-10-CM | POA: Diagnosis present

## 2018-06-19 DIAGNOSIS — Z888 Allergy status to other drugs, medicaments and biological substances status: Secondary | ICD-10-CM | POA: Diagnosis not present

## 2018-06-19 DIAGNOSIS — Z887 Allergy status to serum and vaccine status: Secondary | ICD-10-CM | POA: Diagnosis not present

## 2018-06-19 DIAGNOSIS — Z7984 Long term (current) use of oral hypoglycemic drugs: Secondary | ICD-10-CM | POA: Diagnosis not present

## 2018-06-19 DIAGNOSIS — N179 Acute kidney failure, unspecified: Secondary | ICD-10-CM | POA: Diagnosis present

## 2018-06-19 HISTORY — DX: Gout, unspecified: M10.9

## 2018-06-19 LAB — CBC
HCT: 35.4 % — ABNORMAL LOW (ref 36.0–46.0)
Hemoglobin: 11.7 g/dL — ABNORMAL LOW (ref 12.0–15.0)
MCH: 32.1 pg (ref 26.0–34.0)
MCHC: 33.1 g/dL (ref 30.0–36.0)
MCV: 97 fL (ref 78.0–100.0)
Platelets: 233 10*3/uL (ref 150–400)
RBC: 3.65 MIL/uL — AB (ref 3.87–5.11)
RDW: 12.3 % (ref 11.5–15.5)
WBC: 16 10*3/uL — ABNORMAL HIGH (ref 4.0–10.5)

## 2018-06-19 LAB — COMPREHENSIVE METABOLIC PANEL
ALBUMIN: 2.8 g/dL — AB (ref 3.5–5.0)
ALT: 21 U/L (ref 0–44)
ANION GAP: 13 (ref 5–15)
AST: 21 U/L (ref 15–41)
Alkaline Phosphatase: 72 U/L (ref 38–126)
BUN: 43 mg/dL — ABNORMAL HIGH (ref 8–23)
CHLORIDE: 97 mmol/L — AB (ref 98–111)
CO2: 23 mmol/L (ref 22–32)
Calcium: 9.1 mg/dL (ref 8.9–10.3)
Creatinine, Ser: 2.18 mg/dL — ABNORMAL HIGH (ref 0.44–1.00)
GFR calc Af Amer: 26 mL/min — ABNORMAL LOW (ref >60)
GFR calc non Af Amer: 22 mL/min — ABNORMAL LOW (ref >60)
GLUCOSE: 233 mg/dL — AB (ref 70–99)
POTASSIUM: 4.5 mmol/L (ref 3.5–5.1)
SODIUM: 133 mmol/L — AB (ref 135–145)
Total Bilirubin: 0.8 mg/dL (ref 0.3–1.2)
Total Protein: 7.5 g/dL (ref 6.5–8.1)

## 2018-06-19 LAB — GLUCOSE, CAPILLARY
GLUCOSE-CAPILLARY: 371 mg/dL — AB (ref 70–99)
Glucose-Capillary: 206 mg/dL — ABNORMAL HIGH (ref 70–99)
Glucose-Capillary: 264 mg/dL — ABNORMAL HIGH (ref 70–99)
Glucose-Capillary: 416 mg/dL — ABNORMAL HIGH (ref 70–99)
Glucose-Capillary: 449 mg/dL — ABNORMAL HIGH (ref 70–99)

## 2018-06-19 LAB — TROPONIN I: Troponin I: <0.03 ng/mL (ref <0.03)

## 2018-06-19 LAB — TSH: TSH: 1.837 u[IU]/mL (ref 0.350–4.500)

## 2018-06-19 LAB — HEMOGLOBIN A1C
Hgb A1c MFr Bld: 7.7 % — ABNORMAL HIGH (ref 4.8–5.6)
Mean Plasma Glucose: 174.29 mg/dL

## 2018-06-19 LAB — MAGNESIUM: MAGNESIUM: 2.3 mg/dL (ref 1.7–2.4)

## 2018-06-19 LAB — PHOSPHORUS: Phosphorus: 5.5 mg/dL — ABNORMAL HIGH (ref 2.5–4.6)

## 2018-06-19 LAB — HIV ANTIBODY (ROUTINE TESTING W REFLEX): HIV Screen 4th Generation wRfx: NONREACTIVE

## 2018-06-19 MED ORDER — INSULIN ASPART 100 UNIT/ML ~~LOC~~ SOLN
0.0000 [IU] | Freq: Three times a day (TID) | SUBCUTANEOUS | Status: DC
  Administered 2018-06-20 (×2): 20 [IU] via SUBCUTANEOUS
  Administered 2018-06-20: 11 [IU] via SUBCUTANEOUS

## 2018-06-19 MED ORDER — COLCHICINE 0.6 MG PO TABS
0.3000 mg | ORAL_TABLET | Freq: Two times a day (BID) | ORAL | Status: DC
  Administered 2018-06-19 – 2018-06-20 (×2): 0.3 mg via ORAL
  Filled 2018-06-19 (×4): qty 0.5

## 2018-06-19 MED ORDER — SODIUM CHLORIDE 0.9 % IV SOLN
INTRAVENOUS | Status: DC
  Administered 2018-06-19 – 2018-06-20 (×3): via INTRAVENOUS

## 2018-06-19 MED ORDER — INSULIN ASPART 100 UNIT/ML ~~LOC~~ SOLN
5.0000 [IU] | Freq: Once | SUBCUTANEOUS | Status: AC
  Administered 2018-06-19: 5 [IU] via SUBCUTANEOUS
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
## 2018-06-19 NOTE — Progress Notes (Signed)
Patient ID: Cheryl [name], female   DOB: [birth year], 67 y.o.   MRN: [id number] Given the left knee aspiration showed gout, I was able to come to the bedside this morning and place a steroid injection into her left knee easily.  She tolerated it well.  I recommended she follow-up with her PCP for treatment of her gout.

## 2018-06-19 NOTE — Progress Notes (Signed)
In order to apply R Wrist Splint, IV would have to be removed and located elsewhere. Patient refuses to have IV taken out and placed elsewhere. Patient stated, "I am such a hard stick and I don't want to be stuck again for another IV. They can put the brace on tomorrow once I'm D/C'd and get the IV out." Will continue to encourage.

## 2018-06-19 NOTE — Progress Notes (Signed)
CRITICAL VALUE ALERT  Critical Value: CBG:416  Date & Time Notied: 06/19/18 1645  Provider Notified: Danford  Orders Received/Actions taken: Orders placed. Will continue to monitor.

## 2018-06-20 LAB — BASIC METABOLIC PANEL
ANION GAP: 11 (ref 5–15)
BUN: 67 mg/dL — ABNORMAL HIGH (ref 8–23)
CO2: 20 mmol/L — AB (ref 22–32)
Calcium: 8.3 mg/dL — ABNORMAL LOW (ref 8.9–10.3)
Chloride: 103 mmol/L (ref 98–111)
Creatinine, Ser: 1.98 mg/dL — ABNORMAL HIGH (ref 0.44–1.00)
GFR, EST AFRICAN AMERICAN: 29 mL/min — AB (ref >60)
GFR, EST NON AFRICAN AMERICAN: 25 mL/min — AB (ref >60)
GLUCOSE: 428 mg/dL — AB (ref 70–99)
POTASSIUM: 4.4 mmol/L (ref 3.5–5.1)
Sodium: 134 mmol/L — ABNORMAL LOW (ref 135–145)

## 2018-06-20 LAB — GLUCOSE, CAPILLARY
GLUCOSE-CAPILLARY: 344 mg/dL — AB (ref 70–99)
GLUCOSE-CAPILLARY: 300 mg/dL — AB (ref 70–99)
Glucose-Capillary: 358 mg/dL — ABNORMAL HIGH (ref 70–99)
Glucose-Capillary: 409 mg/dL — ABNORMAL HIGH (ref 70–99)

## 2018-06-20 MED ORDER — ACETAMINOPHEN 500 MG PO TABS: 1000.0000 mg | ORAL_TABLET | Freq: Four times a day (QID) | ORAL | 0 refills | Status: AC | PRN

## 2018-06-20 MED ORDER — SODIUM CHLORIDE 0.9 % IV BOLUS
1000.0000 mL | Freq: Once | INTRAVENOUS | Status: AC
  Administered 2018-06-20: 1000 mL via INTRAVENOUS

## 2018-06-20 MED ORDER — INSULIN ASPART 100 UNIT/ML ~~LOC~~ SOLN
5.0000 [IU] | Freq: Three times a day (TID) | SUBCUTANEOUS | Status: DC
  Administered 2018-06-20 (×2): 5 [IU] via SUBCUTANEOUS

## 2018-06-20 MED ORDER — COLCHICINE 0.6 MG PO TABS: 0.3000 mg | ORAL_TABLET | Freq: Two times a day (BID) | ORAL | 3 refills | Status: AC

## 2018-06-20 MED ORDER — PREDNISONE 10 MG (21) PO TBPK: ORAL_TABLET | ORAL | 0 refills | Status: DC
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
## 2018-06-20 NOTE — Care Management Note (Signed)
Case Management Note  Patient Details  Name: Cheryl HasteGlenda Gill MRN: [id number] Date of Birth: 1951-03-24  Subjective/Objective:                    Action/Plan:  Confirmed face sheet information with patient. Madaline Guthriealled [name] with [facility] to deliver rt platform walker and 3 in 1 to room today  Expected Discharge Date:                  Expected Discharge Plan:  Home w Home Health Services  In-House Referral:  Nutrition  Discharge planning Services  CM Consult  Post Acute Care Choice:  Durable Medical Equipment, Home Health Choice offered to:  Patient  DME Arranged:  3-N-1, Scientific laboratory technicianWalker platform DME Agency:  [facility]  HH Arranged:  [facility], PT, [facility]  [facility]  Status of Service:  Completed, signed off  If discussed at Long Length of Stay Meetings, dates discussed:    Additional Comments:  Kingsley PlanWile, Jazzy Parmer Marie, RN 06/20/2018, 2:52 PM

## 2018-06-20 NOTE — Progress Notes (Addendum)
Physical Therapy Treatment Note  Brief note with full note to follow  Ambulated in hallways with platform RW, and good success; able to walk household distances; Pt seemed encouraged; less pain as well;   OK for dc home from PT standpoint  Van ClinesHolly Charnette Younkin, PT  Acute Rehabilitation Services Pager [phone] Office [phone]     06/20/18 1700  PT Time Calculation  PT Start Time (ACUTE ONLY) 1519  PT Stop Time (ACUTE ONLY) 1540  PT Time Calculation (min) (ACUTE ONLY) 21 min  PT General Charges  $$ ACUTE PT VISIT 1 Visit  PT Treatments  $Gait Training 8-22 mins

## 2018-06-20 NOTE — Consult Note (Signed)
[facility] CM Primary Care Navigator  06/20/2018  Harris Kistler 1951-03-07 [id number]   Met withpatientand daughter Lexine Baton) in theroom to identify possible discharge needs. Patientreports having "left knee pain related to gout, unable to walk" whichhad resulted to this admission.   Patient reports that herprimary care provider isDr.Edwin [name] (for about 15 years) with [facility] who is not a [facility] provider and not under [facility]. Patient states that she has never seen Dr. Glendale Chard with [facility] before.  Patientencouraged to follow-up with herprimary care providerafter hospital discharge or when she returns back home in order to help manageherhealth issues.   For additional questions please contact:  Edwena Felty A. Kyran Whittier, BSN, RN-BC [facility] PRIMARY CARE Navigator Cell: [phone]

## 2018-06-20 NOTE — Progress Notes (Signed)
Patient Discharge: Disposition: Patient discharged to home. Education: Reviewed medications, prescriptions, follow-up appointments and discharge instructions, family and patient verbalized understanding. IV: Discontinued IV before discharge. Telemetry: N/A Transportation: Patient transported out of the unit in w/c accompanied by the staff and family. Belongings: Patient took all her belongings with her.
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
## 2018-06-20 NOTE — Progress Notes (Signed)
Orthopedic Tech Progress Note Patient Details:  Cheryl HasteGlenda Gill June 21, 1951 [id number]  Ortho Devices Type of Ortho Device: Velcro wrist forearm splint Ortho Device/Splint Interventions: Application   Post Interventions Patient Tolerated: Well Instructions Provided: Care of device   Saul FordyceJennifer C Junaid Wurzer 06/20/2018, 1:33 PM

## 2018-06-20 NOTE — Progress Notes (Signed)
Inpatient Diabetes Program Recommendations  AACE/ADA: New Consensus Statement on Inpatient Glycemic Control (2015)  Target Ranges:  Prepandial:   less than 140 mg/dL      Peak postprandial:   less than 180 mg/dL (1-2 hours)      Critically ill patients:  140 - 180 mg/dL   Results for Cheryl Gill, Rose (MRN [id number]) as of 06/20/2018 10:18  Ref. Range 06/19/2018 11:50 06/19/2018 16:34 06/19/2018 18:23 06/19/2018 21:23 06/20/2018 07:46  Glucose-Capillary Latest Ref Range: 70 - 99 mg/dL 409264 (H) 811416 (H) 914449 (H) 371 (H) 358 (H)   Review of Glycemic Control  Diabetes history: DM 2 Outpatient Diabetes medications: Lantus 50 units qhs, Glipizide 10 mg BID Current orders for Inpatient glycemic control: Lantus 50 units qhs, Novolog 0-20 units tid, Novolog 0-5 units qhs  Inpatient Diabetes Program Recommendations:    Patient w/steroid injection to knee in addition to PO prednisone, which is now d/c'd. Glucose trends in the mid 300's this am.   Consider increasing Lantus to 60 units, also consider adding Novolog 5 units tid meal coverage if patient consumes at least 50% of meals.  A1c 7.7% this admission showing fair control at home  Patient will need to check glucose often at home and follow his trends, may need a temporary increase in basal dose for a few days until steroids wear off.  Thanks,  Christena DeemShannon Temperence Zenor RN, MSN, BC-ADM, [facility] Inpatient Diabetes Coordinator Team Pager [phone] (8a-5p)
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
## 2018-06-24 DIAGNOSIS — E785 Hyperlipidemia, unspecified: Secondary | ICD-10-CM | POA: Diagnosis not present

## 2018-06-24 DIAGNOSIS — N183 Chronic kidney disease, stage 3 (moderate): Secondary | ICD-10-CM | POA: Diagnosis not present

## 2018-06-24 DIAGNOSIS — E1122 Type 2 diabetes mellitus with diabetic chronic kidney disease: Secondary | ICD-10-CM | POA: Diagnosis not present

## 2018-06-24 DIAGNOSIS — J45909 Unspecified asthma, uncomplicated: Secondary | ICD-10-CM | POA: Diagnosis not present

## 2018-06-24 DIAGNOSIS — I129 Hypertensive chronic kidney disease with stage 1 through stage 4 chronic kidney disease, or unspecified chronic kidney disease: Secondary | ICD-10-CM | POA: Diagnosis not present

## 2018-06-24 DIAGNOSIS — E669 Obesity, unspecified: Secondary | ICD-10-CM | POA: Diagnosis not present

## 2018-06-24 DIAGNOSIS — M10062 Idiopathic gout, left knee: Secondary | ICD-10-CM | POA: Diagnosis not present

## 2018-06-24 DIAGNOSIS — I2510 Atherosclerotic heart disease of native coronary artery without angina pectoris: Secondary | ICD-10-CM | POA: Diagnosis not present

## 2018-06-24 DIAGNOSIS — Z7982 Long term (current) use of aspirin: Secondary | ICD-10-CM | POA: Diagnosis not present

## 2018-06-27 DIAGNOSIS — E1122 Type 2 diabetes mellitus with diabetic chronic kidney disease: Secondary | ICD-10-CM | POA: Diagnosis not present

## 2018-06-27 DIAGNOSIS — E669 Obesity, unspecified: Secondary | ICD-10-CM | POA: Diagnosis not present

## 2018-06-27 DIAGNOSIS — E785 Hyperlipidemia, unspecified: Secondary | ICD-10-CM | POA: Diagnosis not present

## 2018-06-27 DIAGNOSIS — M10062 Idiopathic gout, left knee: Secondary | ICD-10-CM | POA: Diagnosis not present

## 2018-06-27 DIAGNOSIS — Z7982 Long term (current) use of aspirin: Secondary | ICD-10-CM | POA: Diagnosis not present

## 2018-06-27 DIAGNOSIS — I129 Hypertensive chronic kidney disease with stage 1 through stage 4 chronic kidney disease, or unspecified chronic kidney disease: Secondary | ICD-10-CM | POA: Diagnosis not present

## 2018-06-27 DIAGNOSIS — N183 Chronic kidney disease, stage 3 (moderate): Secondary | ICD-10-CM | POA: Diagnosis not present

## 2018-06-27 DIAGNOSIS — I2510 Atherosclerotic heart disease of native coronary artery without angina pectoris: Secondary | ICD-10-CM | POA: Diagnosis not present

## 2018-06-27 DIAGNOSIS — J45909 Unspecified asthma, uncomplicated: Secondary | ICD-10-CM | POA: Diagnosis not present

## 2018-06-29 DIAGNOSIS — I129 Hypertensive chronic kidney disease with stage 1 through stage 4 chronic kidney disease, or unspecified chronic kidney disease: Secondary | ICD-10-CM | POA: Diagnosis not present

## 2018-06-29 DIAGNOSIS — J45909 Unspecified asthma, uncomplicated: Secondary | ICD-10-CM | POA: Diagnosis not present

## 2018-06-29 DIAGNOSIS — E785 Hyperlipidemia, unspecified: Secondary | ICD-10-CM | POA: Diagnosis not present

## 2018-06-29 DIAGNOSIS — E669 Obesity, unspecified: Secondary | ICD-10-CM | POA: Diagnosis not present

## 2018-06-29 DIAGNOSIS — Z7982 Long term (current) use of aspirin: Secondary | ICD-10-CM | POA: Diagnosis not present

## 2018-06-29 DIAGNOSIS — N183 Chronic kidney disease, stage 3 (moderate): Secondary | ICD-10-CM | POA: Diagnosis not present

## 2018-06-29 DIAGNOSIS — I2510 Atherosclerotic heart disease of native coronary artery without angina pectoris: Secondary | ICD-10-CM | POA: Diagnosis not present

## 2018-06-29 DIAGNOSIS — E1122 Type 2 diabetes mellitus with diabetic chronic kidney disease: Secondary | ICD-10-CM | POA: Diagnosis not present

## 2018-06-29 DIAGNOSIS — M10062 Idiopathic gout, left knee: Secondary | ICD-10-CM | POA: Diagnosis not present

## 2018-07-01 ENCOUNTER — Encounter: Payer: Self-pay | Admitting: Cardiology

## 2018-07-01 ENCOUNTER — Ambulatory Visit: Payer: Medicare HMO | Admitting: Cardiology

## 2018-07-01 VITALS — BP 92/64 | HR 72 | Ht 62.0 in | Wt 181.6 lb

## 2018-07-01 DIAGNOSIS — J45909 Unspecified asthma, uncomplicated: Secondary | ICD-10-CM | POA: Diagnosis not present

## 2018-07-01 DIAGNOSIS — M10062 Idiopathic gout, left knee: Secondary | ICD-10-CM | POA: Diagnosis not present

## 2018-07-01 DIAGNOSIS — I25119 Atherosclerotic heart disease of native coronary artery with unspecified angina pectoris: Secondary | ICD-10-CM

## 2018-07-01 DIAGNOSIS — E1122 Type 2 diabetes mellitus with diabetic chronic kidney disease: Secondary | ICD-10-CM | POA: Diagnosis not present

## 2018-07-01 DIAGNOSIS — E785 Hyperlipidemia, unspecified: Secondary | ICD-10-CM

## 2018-07-01 DIAGNOSIS — Z7982 Long term (current) use of aspirin: Secondary | ICD-10-CM | POA: Diagnosis not present

## 2018-07-01 DIAGNOSIS — I2510 Atherosclerotic heart disease of native coronary artery without angina pectoris: Secondary | ICD-10-CM | POA: Diagnosis not present

## 2018-07-01 DIAGNOSIS — E669 Obesity, unspecified: Secondary | ICD-10-CM | POA: Diagnosis not present

## 2018-07-01 DIAGNOSIS — I129 Hypertensive chronic kidney disease with stage 1 through stage 4 chronic kidney disease, or unspecified chronic kidney disease: Secondary | ICD-10-CM | POA: Diagnosis not present

## 2018-07-01 DIAGNOSIS — N183 Chronic kidney disease, stage 3 (moderate): Secondary | ICD-10-CM | POA: Diagnosis not present

## 2018-07-01 DIAGNOSIS — E8881 Metabolic syndrome: Secondary | ICD-10-CM | POA: Diagnosis not present

## 2018-07-01 LAB — LIPID PANEL
CHOLESTEROL TOTAL: 123 mg/dL (ref 100–199)
Chol/HDL Ratio: 3.8 ratio (ref 0.0–4.4)
HDL: 32 mg/dL — ABNORMAL LOW (ref >39)
LDL Calculated: 66 mg/dL (ref 0–99)
TRIGLYCERIDES: 123 mg/dL (ref 0–149)
VLDL Cholesterol Cal: 25 mg/dL (ref 5–40)

## 2018-07-01 LAB — HEPATIC FUNCTION PANEL
ALBUMIN: 3.8 g/dL (ref 3.6–4.8)
ALT: 31 IU/L (ref 0–32)
AST: 28 IU/L (ref 0–40)
Alkaline Phosphatase: 74 IU/L (ref 39–117)
BILIRUBIN TOTAL: 0.5 mg/dL (ref 0.0–1.2)
BILIRUBIN, DIRECT: 0.16 mg/dL (ref 0.00–0.40)
Total Protein: 6.9 g/dL (ref 6.0–8.5)
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
## 2018-07-01 NOTE — Patient Instructions (Signed)
Medication Instructions: Dr Herbie BaltimoreHarding has recommended making the following medication changes: 1. TAKE Hydrochlorothiazide (HCTZ) AS NEEDED for swelling  Labwork: Your physician recommends that you return for lab work at you convenience - FASTING.  Testing/Procedures: NONE ORDERED  Follow-up: Dr Herbie BaltimoreHarding recommends that you schedule a follow-up appointment in 6 months. You will receive a reminder letter in the mail two months in advance. If you don't receive a letter, please call our office to schedule the follow-up appointment.  If you need a refill on your cardiac medications before your next appointment, please call your pharmacy.   Dr Herbie BaltimoreHarding recommends water walking for exercise.

## 2018-07-03 ENCOUNTER — Encounter: Payer: Self-pay | Admitting: Cardiology

## 2018-07-03 NOTE — Assessment & Plan Note (Signed)
Components:  HTN - controlled  Obesity - ACTUALLY lost weight despite decreased exercise.  Take advantage of this --> start [facility] [employer] [facility] [employer], water exercise/aerobics.  Lipids - TG OK, but HDL down (needs to exercise, consider Vascepa / fish oil).  DM 2 - per PCP

## 2018-07-03 NOTE — Assessment & Plan Note (Signed)
-  He lost weight now with her current arthritis illnesses.  Hopefully she will continue to reduce her dietary intake and increase her exercise and continue to lose more.    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

## 2018-07-03 NOTE — Assessment & Plan Note (Signed)
She has not had checked in a long time.  We ordered labs today and I reviewed them off-line.  Still within target range. ->  would like LDL to be as far as 50, but will maintain current dose of simvastatin for now.  To reach LDL 50, may need to convert to Rosuvastatin @ next visit.

## 2018-07-03 NOTE — Assessment & Plan Note (Addendum)
Continues to be doing well 3 years s/p CABG.  She really had significant LAD disease with relatively normal circumflex and RCA.  She  has LIMA-LAD and SVG to D1 which should do well based on the severity of ostial LAD lesion. No recurrent anginal symptoms. --Not as active as she had been because of arthritis pains.  Needs to be more active with exercise as indicated by her HDL levels going down-->  discussed water aerobics and water exercise.    Continue aspirin, Beta-blocker and statin.  does not --Stop taking HCTZ & change to PRN for edema --> she really is not hypertensive anymore, in fact is borderline HYPOtensive

## 2018-07-05 DIAGNOSIS — Z1239 Encounter for other screening for malignant neoplasm of breast: Secondary | ICD-10-CM | POA: Diagnosis not present

## 2018-07-05 DIAGNOSIS — J4520 Mild intermittent asthma, uncomplicated: Secondary | ICD-10-CM | POA: Diagnosis not present

## 2018-07-05 DIAGNOSIS — M1A9XX0 Chronic gout, unspecified, without tophus (tophi): Secondary | ICD-10-CM | POA: Diagnosis not present

## 2018-07-05 DIAGNOSIS — E1321 Other specified diabetes mellitus with diabetic nephropathy: Secondary | ICD-10-CM | POA: Diagnosis not present

## 2018-07-05 DIAGNOSIS — I2510 Atherosclerotic heart disease of native coronary artery without angina pectoris: Secondary | ICD-10-CM | POA: Diagnosis not present

## 2018-07-05 DIAGNOSIS — I10 Essential (primary) hypertension: Secondary | ICD-10-CM | POA: Diagnosis not present

## 2018-07-05 DIAGNOSIS — E7849 Other hyperlipidemia: Secondary | ICD-10-CM | POA: Diagnosis not present

## 2018-07-05 DIAGNOSIS — N183 Chronic kidney disease, stage 3 (moderate): Secondary | ICD-10-CM | POA: Diagnosis not present

## 2018-07-06 ENCOUNTER — Telehealth: Payer: Self-pay | Admitting: Cardiology

## 2018-07-06 NOTE — Telephone Encounter (Signed)
New Message ° ° ° °Patient is returning call in reference to lab results. Please call.  °

## 2018-07-06 NOTE — Telephone Encounter (Signed)
Left message for pt to call back  °

## 2018-07-07 DIAGNOSIS — M109 Gout, unspecified: Secondary | ICD-10-CM | POA: Diagnosis not present

## 2018-07-07 DIAGNOSIS — N183 Chronic kidney disease, stage 3 (moderate): Secondary | ICD-10-CM | POA: Diagnosis not present

## 2018-07-07 NOTE — Telephone Encounter (Signed)
Lm with husband for pt to call back ./cy 

## 2018-07-30 ENCOUNTER — Encounter (HOSPITAL_COMMUNITY): Payer: Self-pay | Admitting: Emergency Medicine

## 2018-07-30 ENCOUNTER — Ambulatory Visit (HOSPITAL_COMMUNITY)
Admission: EM | Admit: 2018-07-30 | Discharge: 2018-07-30 | Disposition: A | Discharge status: Home / Self Care | Payer: Medicare HMO | Attending: Family Medicine | Admitting: Family Medicine

## 2018-07-30 DIAGNOSIS — J0140 Acute pansinusitis, unspecified: Secondary | ICD-10-CM

## 2018-07-30 MED ORDER — DOXYCYCLINE HYCLATE 100 MG PO CAPS: 100.0000 mg | ORAL_CAPSULE | Freq: Two times a day (BID) | ORAL | 0 refills | Status: DC

## 2018-07-30 NOTE — ED Triage Notes (Signed)
Pt c/o sinus symptoms, facial pain, presure in head, ears feel like "something moving around in there". x3 weeks.

## 2018-07-30 NOTE — ED Provider Notes (Signed)
[facility]    CSN: [id number] Arrival date & time: 07/30/18  1638     History   Chief Complaint Chief Complaint  Patient presents with  . Facial Pain    HPI [name] is a 67 y.o. female.   67 year old female with history of asthma, CAD, HTN, DM, HLD comes in for 3 week history of URI symptoms.  She has had facial pressure, nasal congestion, rhinorrhea, ear pain.  Denies cough, sore throat.  Denies fever, chills, night sweats.  States ear pain makes her feel as if something is "fluttering" in there.  Denies decrease in hearing, ear drainage.  She has been taking Tylenol for the pain without relief.  Takes Flonase and antihistamine for allergies, and has not helped symptoms.  Never smoker.  Has not needed to use her albuterol inhaler.     Past Medical History:  Diagnosis Date  . Asthma   . CAD (coronary artery disease)    a. 12/2014: NSTEMI with cath showing Ostial LAD ~95%, mLAD 80-90%; EF ~40-45%, LAD disease compromised major D1 branch. CABG recommended and performed with LIMA-LAD and SVG-D1.   [name] Kitchen. CKD (chronic kidney disease), stage III (HCC)   . Colitis   . Diabetes mellitus type 2, insulin dependent (HCC)   . Essential hypertension   . Hyperlipidemia with target LDL less than 70 01/28/2014  . Metabolic syndrome: DM, HTN, Obesity as well as HLD 12/18/2014  . NSTEMI (non-ST elevated myocardial infarction) (HCC) 12/18/2014   Echo 1/6: EF 60-65%, no Regional WMA, Gr 1 DD  . Obesity    BMI ~35-36  . 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 Seasonal allergies     Patient Active Problem List   Diagnosis Date Noted  . Acute gout 06/19/2018  . Left leg pain 06/18/2018  . CKD (chronic kidney disease), stage III (HCC) 06/18/2018  . Joint pain 06/18/2018  . Effusion of left knee   . Colitis, acute 03/05/2015  . Colitis 03/05/2015  . Lower abdominal pain   . 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 Atherosclerotic heart disease of native coronary artery with angina  pectoris (HCC) 12/19/2014  . NSTEMI (non-ST elevated myocardial infarction) (HCC) 12/18/2014  . Obesity, Class I, BMI 30-34.9 12/18/2014  . Metabolic syndrome: DM, HTN, Obesity as well as HLD 12/18/2014  . Frequency of urination 07/17/2014  . Nocturia more than twice per night 07/17/2014  . Abdominal pain 01/28/2014  . Asthma, chronic 01/28/2014  . Hyperlipidemia with target LDL less than 70 01/28/2014  . Leukocytosis 01/28/2014  . Normocytic anemia 01/18/2013  . 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 Diabetes mellitus type 2, uncontrolled (HCC) 01/16/2013  . Seasonal allergies     Past Surgical History:  Procedure Laterality Date  . ABDOMINAL HYSTERECTOMY    . CORONARY ARTERY BYPASS GRAFT N/A 12/21/2014   Procedure: CORONARY ARTERY BYPASS GRAFTING (CABG) TIMES TWO USING LEFT INTERNAL MAMMARY AND RIGHT SAPHENOUS LEG VEIN HARVESTED ENDOSCOPICALLY;  Surgeon: Loreli SlotSteven C Hendrickson, MD;  [facility];  Service: Open Heart Surgery;  Laterality: N/A;  . LEFT HEART CATHETERIZATION WITH CORONARY ANGIOGRAM N/A 12/18/2014   Procedure: LEFT HEART CATHETERIZATION WITH CORONARY ANGIOGRAM;  Surgeon: Peter M [name], MD;  [facility];  Service: Cardiovascular;  Laterality: N/A;  . NM MYOCAR PERF WALL MOTION  04/09/2010   Abnormal study - appears to be a small area of apical infarction. Reversible  ischemia is not seen.  . TEE WITHOUT CARDIOVERSION N/A 12/21/2014   Procedure: TRANSESOPHAGEAL ECHOCARDIOGRAM (TEE);  Surgeon: Loreli SlotSteven C Hendrickson, MD;  [facility];  Service: Open Heart Surgery;  Laterality: N/A;  . TONSILLECTOMY    . TRANSTHORACIC ECHOCARDIOGRAM  12/2014   LVEF 60-65%, normal wall thickness, normal wall motion, diastolic dysfunction, indeterminate LV filling pressure, normal LA size.    OB History    Gravida  5   Para  4   Term  4   Preterm      AB  1   Living  4     SAB  1   TAB       Ectopic      Multiple      Live Births  4            Home Medications    Prior to Admission medications   Medication Sig Start Date End Date Taking? Authorizing Provider  albuterol (PROVENTIL HFA;VENTOLIN HFA) 108 (90 BASE) MCG/ACT inhaler Inhale 2 puffs into the lungs every 6 (six) hours as needed. For shortness of breath or wheezing    [provider]  aspirin EC 81 MG tablet Take 81 mg by mouth daily.    [provider]  cetirizine (ZYRTEC) 10 MG tablet Take 10 mg by mouth daily as needed for allergies.    [provider]  colchicine 0.6 MG tablet Take 0.5 tablets (0.3 mg total) by mouth 2 (two) times daily. 06/20/18   Danford, Earl Liteshristopher P, MD  cycloSPORINE (RESTASIS) 0.05 % ophthalmic emulsion Place 1 drop into both eyes 2 (two) times daily. 10/29/17   [provider]  doxycycline (VIBRAMYCIN) 100 MG capsule Take 1 capsule (100 mg total) by mouth 2 (two) times daily. 07/30/18   Cathie HoopsYu, Amy V, PA-C  fluticasone (FLONASE) 50 MCG/ACT nasal spray Place 2 sprays into both nostrils as needed for allergies or rhinitis.    [provider]  Fluticasone-Salmeterol (ADVAIR) 100-50 MCG/DOSE AEPB Inhale 1 puff into the lungs 2 (two) times daily.    [provider]  glipiZIDE (GLUCOTROL) 10 MG tablet Take 10 mg by mouth 2 (two) times daily before a meal.    [provider]  hydrochlorothiazide (HYDRODIURIL) 25 MG tablet Take 25 mg by mouth daily as needed (swelling).    [provider]  LANTUS SOLOSTAR 100 UNIT/ML Solostar Pen Inject 50 Units into the skin at bedtime. 03/15/18   [provider]  metoprolol tartrate (LOPRESSOR) 25 MG tablet TAKE 1 TABLET BY MOUTH TWICE DAILY 03/15/18   Marykay LexHarding, David W, MD  Multiple Vitamins-Calcium (MULTI-DAY/CALCIUM/EXTRA IRON) TABS Take 1 tablet by mouth daily. 05/31/18   [provider]  predniSONE (STERAPRED UNI-PAK 21 TAB) 10 MG (21) TBPK tablet Taper as follows: take 6 tabs on  day 1, then  5 tabs on day 2, then  4 tabs on day 3, then  3 tabs on day 4, then  2 tabs, then  1 tab then stop. 06/20/18   Danford, Earl Liteshristopher P, MD  simvastatin (ZOCOR) 40 MG tablet Take 40 mg by mouth at bedtime. 02/01/18   [provider]  traMADol (ULTRAM) 50 MG tablet Take 1 tablet (50 mg total) by mouth every 6 (six) hours as needed. 06/14/18   Liberty HandyGibbons, Claudia J, PA-C    Family History Family History  Problem Relation Age of Onset  . Pancreatic cancer Mother   . CAD Mother        Open heart  surgery 60s-70s  . Stroke Father   . Heart disease Sister        Unclear details    Social History Social History   Tobacco Use  . Smoking status: Never Smoker  . Smokeless tobacco: Never Used  Substance Use Topics  . Alcohol use: No  . Drug use: No     Allergies   Ace inhibitors; Lisinopril; Augmentin [amoxicillin-pot clavulanate]; Mushroom extract complex; and Tetanus toxoids   Review of Systems Review of Systems  Reason unable to perform ROS: See HPI as above.     Physical Exam Triage Vital Signs ED Triage Vitals [07/30/18 1650]  Enc Vitals Group     BP 128/72     Pulse Rate 76     Resp 18     Temp 98 F (36.7 C)     Temp src      SpO2 100 %     Weight      Height      Head Circumference      Peak Flow      Pain Score 4     Pain Loc      Pain Edu?      Excl. in GC?    No data found.  Updated Vital Signs BP 128/72   Pulse 76   Temp 98 F (36.7 C)   Resp 18   SpO2 100%   Physical Exam  Constitutional: She is oriented to person, place, and time. She appears well-developed and well-nourished. No distress.  HENT:  Head: Normocephalic and atraumatic.  Right Ear: Tympanic membrane, external ear and ear canal normal. Tympanic membrane is not erythematous and not bulging.  Left Ear: Tympanic membrane, external ear and ear canal normal. Tympanic membrane is not erythematous and not bulging.  Nose: Mucosal edema present. Right sinus exhibits  maxillary sinus tenderness and frontal sinus tenderness. Left sinus exhibits maxillary sinus tenderness and frontal sinus tenderness.  Mouth/Throat: Uvula is midline, oropharynx is clear and moist and mucous membranes are normal.  Eyes: Pupils are equal, round, and reactive to light. Conjunctivae are normal.  Neck: Normal range of motion. Neck supple.  Cardiovascular: Normal rate, regular rhythm and normal heart sounds. Exam reveals no gallop and no friction rub.  No murmur heard. Pulmonary/Chest: Effort normal and breath sounds normal. She has no decreased breath sounds. She has no wheezes. She has no rhonchi. She has no rales.  Lymphadenopathy:    She has no cervical adenopathy.  Neurological: She is alert and oriented to person, place, and time. She is not disoriented.  Able to ambulate on own without difficulty. Currently being treated for gout, causing limping with walking.   Skin: Skin is warm and dry.  Psychiatric: She has a normal mood and affect. Her behavior is normal. Judgment normal.   UC Treatments / Results  Labs (all labs ordered are listed, but only abnormal results are displayed) Labs Reviewed - No data to display  EKG None  Radiology No results found.  Procedures Procedures (including critical care time)  Medications Ordered in UC Medications - No data to display  Initial Impression / Assessment and Plan / UC Course  I have reviewed the triage vital signs and the nursing notes.  Pertinent labs & imaging results that were available during my care of the patient were reviewed by me and considered in my medical decision making (see chart for details).    Doxycycline for sinusitis. Other symptomatic treatment discussed. Return precautions given.  Final Clinical Impressions(s) / UC Diagnoses   Final diagnoses:  Acute non-recurrent pansinusitis    ED Prescriptions    Medication Sig Dispense Auth. Provider   doxycycline (VIBRAMYCIN) 100 MG capsule Take 1  capsule (100 mg total) by mouth 2 (two) times daily. 20 capsule Threasa Alpha, [place] 07/30/18 1718

## 2018-07-30 NOTE — Discharge Instructions (Signed)
Start doxycycline as directed for sinus infection. Continue zyrtec and flonase. You can add on nasal saline spray/rinse. Continue tylenol for pain. Follow up here or with PCP for further evaluation if symptoms not improving.

## 2018-08-19 ENCOUNTER — Encounter: Payer: Self-pay | Admitting: *Deleted

## 2018-08-19 ENCOUNTER — Ambulatory Visit (HOSPITAL_COMMUNITY)
Admission: EM | Admit: 2018-08-19 | Discharge: 2018-08-19 | Disposition: A | Discharge status: Home / Self Care | Payer: Medicare HMO | Attending: Internal Medicine | Admitting: Internal Medicine

## 2018-08-19 ENCOUNTER — Encounter (HOSPITAL_COMMUNITY): Payer: Self-pay

## 2018-08-19 DIAGNOSIS — J019 Acute sinusitis, unspecified: Secondary | ICD-10-CM

## 2018-08-19 MED ORDER — CEFDINIR 300 MG PO CAPS: 300.0000 mg | ORAL_CAPSULE | Freq: Two times a day (BID) | ORAL | 0 refills | Status: DC

## 2018-08-19 NOTE — ED Triage Notes (Signed)
Pt presents with complaints of nasal congestion. Was recently seen and is now feeling worse. Pt reports feeling bad for three weeks.

## 2018-08-19 NOTE — Discharge Instructions (Addendum)
Prescription for omnicef (antibiotic) was sent to the pharmacy.  Continue zyrtec and flonase.  Recheck for new fever >100.5, increasing phlegm production/nasal discharge, or if not starting to improve in a few days.

## 2018-08-19 NOTE — ED Provider Notes (Signed)
[facility]    CSN: [id number] Arrival date & time: 08/19/18  1725     History   Chief Complaint Chief Complaint  Patient presents with  . Nasal Congestion    HPI Cheryl Gill is a 67 y.o. female.   She has history of frequent sinus infections, 3-4 times per year, saw Dr. Karlyn Agee in January.  She had a sinus CT and has not followed up since to discuss next steps.  She was seen at the urgent care a couple weeks ago with flareup of congestion and sinus drainage, treated with doxycycline.  She is back today with worsening symptoms, did not get any relief from doxycycline.  She has continued on her regimen of Zyrtec and Flonase.    HPI  Past Medical History:  Diagnosis Date  . Asthma   . CAD (coronary artery disease)    a. 12/2014: NSTEMI with cath showing Ostial LAD ~95%, mLAD 80-90%; EF ~40-45%, LAD disease compromised major D1 branch. CABG recommended and performed with LIMA-LAD and SVG-D1.   [name] Kitchen CKD (chronic kidney disease), stage III (HCC)   . Colitis   . Diabetes mellitus type 2, insulin dependent (HCC)   . Essential hypertension   . Hyperlipidemia with target LDL less than 70 01/28/2014  . Metabolic syndrome: DM, HTN, Obesity as well as HLD 12/18/2014  . NSTEMI (non-ST elevated myocardial infarction) (HCC) 12/18/2014   Echo 1/6: EF 60-65%, no Regional WMA, Gr 1 DD  . Obesity    BMI ~35-36  . 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 Seasonal allergies     Patient Active Problem List   Diagnosis Date Noted  . Acute gout 06/19/2018  . Left leg pain 06/18/2018  . CKD (chronic kidney disease), stage III (HCC) 06/18/2018  . Joint pain 06/18/2018  . Effusion of left knee   . Colitis, acute 03/05/2015  . Colitis 03/05/2015  . Lower abdominal pain   . 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 Atherosclerotic heart disease of native coronary artery with angina pectoris (HCC) 12/19/2014  . NSTEMI (non-ST elevated myocardial infarction) (HCC) 12/18/2014  . Obesity, Class  I, BMI 30-34.9 12/18/2014  . Metabolic syndrome: DM, HTN, Obesity as well as HLD 12/18/2014  . Frequency of urination 07/17/2014  . Nocturia more than twice per night 07/17/2014  . Abdominal pain 01/28/2014  . Asthma, chronic 01/28/2014  . Hyperlipidemia with target LDL less than 70 01/28/2014  . Leukocytosis 01/28/2014  . Normocytic anemia 01/18/2013  . 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 Diabetes mellitus type 2, uncontrolled (HCC) 01/16/2013  . Seasonal allergies     Past Surgical History:  Procedure Laterality Date  . ABDOMINAL HYSTERECTOMY    . CORONARY ARTERY BYPASS GRAFT N/A 12/21/2014   Procedure: CORONARY ARTERY BYPASS GRAFTING (CABG) TIMES TWO USING LEFT INTERNAL MAMMARY AND RIGHT SAPHENOUS LEG VEIN HARVESTED ENDOSCOPICALLY;  Surgeon: Loreli Slot, MD;  [facility];  Service: Open Heart Surgery;  Laterality: N/A;  . LEFT HEART CATHETERIZATION WITH CORONARY ANGIOGRAM N/A 12/18/2014   Procedure: LEFT HEART CATHETERIZATION WITH CORONARY ANGIOGRAM;  Surgeon: Peter M Swaziland, MD;  Location: [facility] CATH LAB;  Service: Cardiovascular;  Laterality: N/A;  . NM MYOCAR PERF WALL MOTION  04/09/2010   Abnormal study - appears to be a small area of apical infarction. Reversible ischemia is not seen.  . TEE WITHOUT CARDIOVERSION N/A 12/21/2014   Procedure: TRANSESOPHAGEAL ECHOCARDIOGRAM (TEE);  Surgeon: Loreli Slot, MD;  Location: [facility] OR;  Service: Open Heart Surgery;  Laterality: N/A;  . TONSILLECTOMY    . TRANSTHORACIC ECHOCARDIOGRAM  12/2014   LVEF 60-65%, normal wall thickness, normal wall motion, diastolic dysfunction, indeterminate LV filling pressure, normal LA size.    OB History    Gravida  5   Para  4   Term  4   Preterm      AB  1   Living  4     SAB  1   TAB      Ectopic      Multiple      Live Births  4            Home Medications    Prior to Admission medications    Medication Sig Start Date End Date Taking? Authorizing Provider  albuterol (PROVENTIL HFA;VENTOLIN HFA) 108 (90 BASE) MCG/ACT inhaler Inhale 2 puffs into the lungs every 6 (six) hours as needed. For shortness of breath or wheezing    [provider]  aspirin EC 81 MG tablet Take 81 mg by mouth daily.    [provider]  cefdinir (OMNICEF) 300 MG capsule Take 1 capsule (300 mg total) by mouth 2 (two) times daily. 08/19/18   Isa Rankin, MD  cetirizine (ZYRTEC) 10 MG tablet Take 10 mg by mouth daily as needed for allergies.    [provider]  colchicine 0.6 MG tablet Take 0.5 tablets (0.3 mg total) by mouth 2 (two) times daily. 06/20/18   Danford, Earl Lites, MD  cycloSPORINE (RESTASIS) 0.05 % ophthalmic emulsion Place 1 drop into both eyes 2 (two) times daily. 10/29/17   [provider]  doxycycline (VIBRAMYCIN) 100 MG capsule Take 1 capsule (100 mg total) by mouth 2 (two) times daily. 07/30/18   Cathie Hoops, Amy V, PA-C  fluticasone (FLONASE) 50 MCG/ACT nasal spray Place 2 sprays into both nostrils as needed for allergies or rhinitis.    [provider]  Fluticasone-Salmeterol (ADVAIR) 100-50 MCG/DOSE AEPB Inhale 1 puff into the lungs 2 (two) times daily.    [provider]  glipiZIDE (GLUCOTROL) 10 MG tablet Take 10 mg by mouth 2 (two) times daily before a meal.    [provider]  hydrochlorothiazide (HYDRODIURIL) 25 MG tablet Take 25 mg by mouth daily as needed (swelling).    [provider]  LANTUS SOLOSTAR 100 UNIT/ML Solostar Pen Inject 50 Units into the skin at bedtime. 03/15/18   [provider]  metoprolol tartrate (LOPRESSOR) 25 MG tablet TAKE 1 TABLET BY MOUTH TWICE DAILY 03/15/18   Marykay Lex, MD  Multiple Vitamins-Calcium (MULTI-DAY/CALCIUM/EXTRA IRON) TABS Take 1 tablet by mouth daily. 05/31/18   [provider]  predniSONE (STERAPRED UNI-PAK 21 TAB) 10 MG (21) TBPK tablet Taper as follows: take 6  tabs on day 1, then  5 tabs on day 2, then  4 tabs on day 3, then  3 tabs on day 4, then  2 tabs, then  1 tab then stop. 06/20/18   Danford, Earl Lites, MD  simvastatin (ZOCOR) 40 MG tablet Take 40 mg by mouth at bedtime. 02/01/18   [provider]  traMADol (ULTRAM) 50 MG tablet Take 1 tablet (50 mg total) by mouth every 6 (six) hours as needed. 06/14/18   Liberty Handy, PA-C    Family History Family History  Problem Relation Age of Onset  . Pancreatic cancer Mother   . CAD Mother  Open heart surgery 60s-70s  . Stroke Father   . Heart disease Sister        Unclear details    Social History Social History   Tobacco Use  . Smoking status: Never Smoker  . Smokeless tobacco: Never Used  Substance Use Topics  . Alcohol use: No  . Drug use: No     Allergies   Ace inhibitors; Lisinopril; Augmentin [amoxicillin-pot clavulanate]; Mushroom extract complex; and Tetanus toxoids   Review of Systems Review of Systems  All other systems reviewed and are negative.    Physical Exam Triage Vital Signs ED Triage Vitals  Enc Vitals Group     BP 08/19/18 1818 131/72     Pulse Rate 08/19/18 1818 68     Resp 08/19/18 1818 18     Temp 08/19/18 1818 98 F (36.7 C)     Temp src --      SpO2 08/19/18 1818 100 %     Weight --      Height --      Pain Score 08/19/18 1817 0     Pain Loc --    Updated Vital Signs BP 131/72   Pulse 68   Temp 98 F (36.7 C)   Resp 18   SpO2 100%  Physical Exam  Constitutional: She is oriented to person, place, and time. No distress.  HENT:  Head: Atraumatic.  Mouth breathing, dark circles under eyes Voice sounds very congested Frequent sniffling  Eyes:  Conjugate gaze observed, no eye redness/discharge  Neck: Neck supple.  Cardiovascular: Normal rate and regular rhythm.  Pulmonary/Chest: No respiratory distress. She has no wheezes. She has no rales.  Lungs clear, symmetric breath sounds   Abdominal: She exhibits no  distension.  Musculoskeletal: Normal range of motion.  Neurological: She is alert and oriented to person, place, and time.  Skin: Skin is warm and dry.  Nursing note and vitals reviewed.     Final Clinical Impressions(s) / UC Diagnoses   Final diagnoses:  Acute sinusitis with symptoms greater than 10 days     Discharge Instructions     Prescription for omnicef (antibiotic) was sent to the pharmacy.  Continue zyrtec and flonase.  Recheck for new fever >100.5, increasing phlegm production/nasal discharge, or if not starting to improve in a few days.      ED Prescriptions    Medication Sig Dispense Auth. Provider   cefdinir (OMNICEF) 300 MG capsule Take 1 capsule (300 mg total) by mouth 2 (two) times daily. 20 capsule Isa Rankin, MD        Isa Rankin, MD 08/19/18 2137

## 2018-10-04 DIAGNOSIS — I10 Essential (primary) hypertension: Secondary | ICD-10-CM | POA: Diagnosis not present

## 2018-10-04 DIAGNOSIS — J4520 Mild intermittent asthma, uncomplicated: Secondary | ICD-10-CM | POA: Diagnosis not present

## 2018-10-04 DIAGNOSIS — M1A9XX0 Chronic gout, unspecified, without tophus (tophi): Secondary | ICD-10-CM | POA: Diagnosis not present

## 2018-10-04 DIAGNOSIS — Z23 Encounter for immunization: Secondary | ICD-10-CM | POA: Diagnosis not present

## 2018-10-04 DIAGNOSIS — E1321 Other specified diabetes mellitus with diabetic nephropathy: Secondary | ICD-10-CM | POA: Diagnosis not present

## 2018-10-04 DIAGNOSIS — I2510 Atherosclerotic heart disease of native coronary artery without angina pectoris: Secondary | ICD-10-CM | POA: Diagnosis not present

## 2018-11-02 ENCOUNTER — Encounter (HOSPITAL_COMMUNITY): Payer: Self-pay | Admitting: Emergency Medicine

## 2018-11-02 ENCOUNTER — Ambulatory Visit (HOSPITAL_COMMUNITY)
Admission: EM | Admit: 2018-11-02 | Discharge: 2018-11-02 | Disposition: A | Discharge status: Home / Self Care | Payer: Medicare HMO | Attending: Family Medicine | Admitting: Family Medicine

## 2018-11-02 DIAGNOSIS — M2550 Pain in unspecified joint: Secondary | ICD-10-CM | POA: Diagnosis not present

## 2018-11-02 DIAGNOSIS — Z888 Allergy status to other drugs, medicaments and biological substances status: Secondary | ICD-10-CM | POA: Diagnosis not present

## 2018-11-02 DIAGNOSIS — N183 Chronic kidney disease, stage 3 (moderate): Secondary | ICD-10-CM | POA: Diagnosis not present

## 2018-11-02 DIAGNOSIS — Z794 Long term (current) use of insulin: Secondary | ICD-10-CM | POA: Diagnosis not present

## 2018-11-02 DIAGNOSIS — Z79899 Other long term (current) drug therapy: Secondary | ICD-10-CM | POA: Insufficient documentation

## 2018-11-02 DIAGNOSIS — Z880 Allergy status to penicillin: Secondary | ICD-10-CM | POA: Insufficient documentation

## 2018-11-02 DIAGNOSIS — I2510 Atherosclerotic heart disease of native coronary artery without angina pectoris: Secondary | ICD-10-CM | POA: Diagnosis not present

## 2018-11-02 DIAGNOSIS — Z8249 Family history of ischemic heart disease and other diseases of the circulatory system: Secondary | ICD-10-CM | POA: Diagnosis not present

## 2018-11-02 DIAGNOSIS — I129 Hypertensive chronic kidney disease with stage 1 through stage 4 chronic kidney disease, or unspecified chronic kidney disease: Secondary | ICD-10-CM | POA: Diagnosis not present

## 2018-11-02 DIAGNOSIS — E785 Hyperlipidemia, unspecified: Secondary | ICD-10-CM | POA: Insufficient documentation

## 2018-11-02 DIAGNOSIS — I252 Old myocardial infarction: Secondary | ICD-10-CM | POA: Diagnosis not present

## 2018-11-02 DIAGNOSIS — G8929 Other chronic pain: Secondary | ICD-10-CM | POA: Diagnosis not present

## 2018-11-02 DIAGNOSIS — Z881 Allergy status to other antibiotic agents status: Secondary | ICD-10-CM | POA: Diagnosis not present

## 2018-11-02 DIAGNOSIS — M549 Dorsalgia, unspecified: Secondary | ICD-10-CM | POA: Diagnosis present

## 2018-11-02 DIAGNOSIS — Z951 Presence of aortocoronary bypass graft: Secondary | ICD-10-CM | POA: Insufficient documentation

## 2018-11-02 DIAGNOSIS — J45909 Unspecified asthma, uncomplicated: Secondary | ICD-10-CM | POA: Diagnosis not present

## 2018-11-02 DIAGNOSIS — E1122 Type 2 diabetes mellitus with diabetic chronic kidney disease: Secondary | ICD-10-CM | POA: Diagnosis not present

## 2018-11-02 DIAGNOSIS — M545 Low back pain, unspecified: Secondary | ICD-10-CM

## 2018-11-02 DIAGNOSIS — Z7982 Long term (current) use of aspirin: Secondary | ICD-10-CM | POA: Diagnosis not present

## 2018-11-02 DIAGNOSIS — D649 Anemia, unspecified: Secondary | ICD-10-CM | POA: Diagnosis not present

## 2018-11-02 LAB — POCT URINALYSIS DIP (DEVICE)
Bilirubin Urine: NEGATIVE
Bilirubin Urine: NEGATIVE
GLUCOSE, UA: 500 mg/dL — AB
GLUCOSE, UA: 500 mg/dL — AB
Hgb urine dipstick: NEGATIVE
Hgb urine dipstick: NEGATIVE
KETONES UR: NEGATIVE mg/dL
Ketones, ur: NEGATIVE mg/dL
Leukocytes, UA: NEGATIVE
Leukocytes, UA: NEGATIVE
Nitrite: POSITIVE — AB
Nitrite: POSITIVE — AB
PH: 5.5 (ref 5.0–8.0)
PH: 6 (ref 5.0–8.0)
PROTEIN: NEGATIVE mg/dL
PROTEIN: NEGATIVE mg/dL
SPECIFIC GRAVITY, URINE: 1.025 (ref 1.005–1.030)
Specific Gravity, Urine: 1.015 (ref 1.005–1.030)
UROBILINOGEN UA: 0.2 mg/dL (ref 0.0–1.0)
Urobilinogen, UA: 0.2 mg/dL (ref 0.0–1.0)

## 2018-11-02 MED ORDER — METHOCARBAMOL 500 MG PO TABS: 500.0000 mg | ORAL_TABLET | Freq: Two times a day (BID) | ORAL | 0 refills | Status: DC

## 2018-11-02 MED ORDER — TRAMADOL HCL 50 MG PO TABS: 50.0000 mg | ORAL_TABLET | Freq: Two times a day (BID) | ORAL | 0 refills | Status: DC | PRN

## 2018-11-02 NOTE — Discharge Instructions (Addendum)
Please continue to take Tylenol [phone] mg every 4-6 hours for pain May add in Robaxin 2-3 times a day, this may cause drowsiness, do not use while working I have sent in a few tablets of tramadol for you to use only for severe pain.  This will also cause drowsiness, do not use while working or needing to drive, do not mix with Robaxin I have sent a referral in for physical therapy if you wish to pursue this  We have sent the urine off for culture to check for signs of infection in the urine, we will call you if this comes back positive and need antibiotics
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
## 2018-11-05 LAB — URINE CULTURE

## 2018-11-07 ENCOUNTER — Telehealth (HOSPITAL_COMMUNITY): Payer: Self-pay

## 2018-11-07 MED ORDER — NITROFURANTOIN MONOHYD MACRO 100 MG PO CAPS: 100.0000 mg | ORAL_CAPSULE | Freq: Two times a day (BID) | ORAL | 0 refills | Status: AC

## 2018-11-07 NOTE — Telephone Encounter (Signed)
Urine culture was positive for ESCHERICHIA COLI. Prescription for macrobid per SN was sent to pharmacy of choice. Attempted call pt not home

## 2018-11-09 ENCOUNTER — Telehealth (HOSPITAL_COMMUNITY): Payer: Self-pay | Admitting: Emergency Medicine

## 2018-11-09 NOTE — Telephone Encounter (Signed)
Attempted to reach patient x2. No answer at this time. Voicemail left.    

## 2018-11-12 ENCOUNTER — Telehealth (HOSPITAL_COMMUNITY): Payer: Self-pay | Admitting: Emergency Medicine

## 2018-11-12 NOTE — Telephone Encounter (Signed)
Attempted to reach patient. No answer at this time. Letter sent.

## 2018-11-15 DIAGNOSIS — H40013 Open angle with borderline findings, low risk, bilateral: Secondary | ICD-10-CM | POA: Diagnosis not present

## 2018-11-15 DIAGNOSIS — H2513 Age-related nuclear cataract, bilateral: Secondary | ICD-10-CM | POA: Diagnosis not present

## 2018-11-15 DIAGNOSIS — H16223 Keratoconjunctivitis sicca, not specified as Sjogren's, bilateral: Secondary | ICD-10-CM | POA: Diagnosis not present

## 2018-11-15 DIAGNOSIS — E119 Type 2 diabetes mellitus without complications: Secondary | ICD-10-CM | POA: Diagnosis not present

## 2018-11-18 ENCOUNTER — Other Ambulatory Visit: Payer: Self-pay | Admitting: Internal Medicine

## 2018-11-18 DIAGNOSIS — Z1231 Encounter for screening mammogram for malignant neoplasm of breast: Secondary | ICD-10-CM

## 2018-12-05 ENCOUNTER — Encounter (HOSPITAL_COMMUNITY): Payer: Self-pay

## 2018-12-05 ENCOUNTER — Other Ambulatory Visit: Payer: Self-pay

## 2018-12-05 ENCOUNTER — Ambulatory Visit (HOSPITAL_COMMUNITY)
Admission: EM | Admit: 2018-12-05 | Discharge: 2018-12-05 | Disposition: A | Discharge status: Home / Self Care | Payer: Medicare HMO | Attending: Family Medicine | Admitting: Family Medicine

## 2018-12-05 DIAGNOSIS — J019 Acute sinusitis, unspecified: Secondary | ICD-10-CM | POA: Diagnosis not present

## 2018-12-05 MED ORDER — DOXYCYCLINE HYCLATE 100 MG PO CAPS: 100.0000 mg | ORAL_CAPSULE | Freq: Two times a day (BID) | ORAL | 0 refills | Status: AC

## 2018-12-05 MED ORDER — CETIRIZINE HCL 10 MG PO CAPS: 10.0000 mg | ORAL_CAPSULE | Freq: Every day | ORAL | 0 refills | Status: DC

## 2018-12-05 NOTE — ED Triage Notes (Signed)
Pt cc sinus in infection her face is sore.

## 2018-12-05 NOTE — Discharge Instructions (Signed)
I am treating you for a sinus infection Please begin doxycycline twice daily for the next 10 days Please restart Zyrtec May add in Flonase Drink plenty of fluids Follow-up if symptoms not resolving over the next week with starting the antibiotic.

## 2018-12-08 NOTE — ED Provider Notes (Signed)
[facility]    CSN: [id number] Arrival date & time: 12/05/18  1916     History   Chief Complaint Chief Complaint  Patient presents with  . Facial Pain    HPI Cheryl Gill is a 67 y.o. female   history of hypertension, hyperlipidemia, CAD, CKD stage III, DM type II presenting today for evaluation of possible sinus infection.  Patient states that she has had facial pressure, nasal congestion and headaches.  She has had some mild sore throat and coughing associated with this.  Her main complaint is the pressure and congestion.  Symptoms are mainly located on the left side of her face.  Symptoms have been going on for approximately 2 weeks.  Denies any fevers.  She is tried some over-the-counter medicines without relief.  HPI  Past Medical History:  Diagnosis Date  . Asthma   . CAD (coronary artery disease)    a. 12/2014: NSTEMI with cath showing Ostial LAD ~95%, mLAD 80-90%; EF ~40-45%, LAD disease compromised major D1 branch. CABG recommended and performed with LIMA-LAD and SVG-D1.   [name] Kitchen CKD (chronic kidney disease), stage III (HCC)   . Colitis   . Diabetes mellitus type 2, insulin dependent (HCC)   . Essential hypertension   . Hyperlipidemia with target LDL less than 70 01/28/2014  . Metabolic syndrome: DM, HTN, Obesity as well as HLD 12/18/2014  . NSTEMI (non-ST elevated myocardial infarction) (HCC) 12/18/2014   Echo 1/6: EF 60-65%, no Regional WMA, Gr 1 DD  . Obesity    BMI ~35-36  . 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 Seasonal allergies     Patient Active Problem List   Diagnosis Date Noted  . Acute gout 06/19/2018  . Left leg pain 06/18/2018  . CKD (chronic kidney disease), stage III (HCC) 06/18/2018  . Joint pain 06/18/2018  . Effusion of left knee   . Colitis, acute 03/05/2015  . Colitis 03/05/2015  . Lower abdominal pain   . 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 Atherosclerotic heart disease of native coronary artery with angina pectoris (HCC)  12/19/2014  . NSTEMI (non-ST elevated myocardial infarction) (HCC) 12/18/2014  . Obesity, Class I, BMI 30-34.9 12/18/2014  . Metabolic syndrome: DM, HTN, Obesity as well as HLD 12/18/2014  . Frequency of urination 07/17/2014  . Nocturia more than twice per night 07/17/2014  . Abdominal pain 01/28/2014  . Asthma, chronic 01/28/2014  . Hyperlipidemia with target LDL less than 70 01/28/2014  . Leukocytosis 01/28/2014  . Normocytic anemia 01/18/2013  . 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 Diabetes mellitus type 2, uncontrolled (HCC) 01/16/2013  . Seasonal allergies     Past Surgical History:  Procedure Laterality Date  . ABDOMINAL HYSTERECTOMY    . CORONARY ARTERY BYPASS GRAFT N/A 12/21/2014   Procedure: CORONARY ARTERY BYPASS GRAFTING (CABG) TIMES TWO USING LEFT INTERNAL MAMMARY AND RIGHT SAPHENOUS LEG VEIN HARVESTED ENDOSCOPICALLY;  Surgeon: Loreli Slot, MD;  Location: [facility] OR;  Service: Open Heart Surgery;  Laterality: N/A;  . LEFT HEART CATHETERIZATION WITH CORONARY ANGIOGRAM N/A 12/18/2014   Procedure: LEFT HEART CATHETERIZATION WITH CORONARY ANGIOGRAM;  Surgeon: Peter M Swaziland, MD;  Location: [facility] CATH LAB;  Service: Cardiovascular;  Laterality: N/A;  . NM MYOCAR PERF WALL MOTION  04/09/2010   Abnormal study - appears to be a small area of apical infarction. Reversible ischemia is not seen.  . TEE WITHOUT CARDIOVERSION N/A 12/21/2014  Procedure: TRANSESOPHAGEAL ECHOCARDIOGRAM (TEE);  Surgeon: Loreli Slot, MD;  Location: [facility] OR;  Service: Open Heart Surgery;  Laterality: N/A;  . TONSILLECTOMY    . TRANSTHORACIC ECHOCARDIOGRAM  12/2014   LVEF 60-65%, normal wall thickness, normal wall motion, diastolic dysfunction, indeterminate LV filling pressure, normal LA size.    OB History    Gravida  5   Para  4   Term  4   Preterm      AB  1   Living  4     SAB  1   TAB      Ectopic       Multiple      Live Births  4            Home Medications    Prior to Admission medications   Medication Sig Start Date End Date Taking? Authorizing Provider  albuterol (PROVENTIL HFA;VENTOLIN HFA) 108 (90 BASE) MCG/ACT inhaler Inhale 2 puffs into the lungs every 6 (six) hours as needed. For shortness of breath or wheezing    [provider]  aspirin EC 81 MG tablet Take 81 mg by mouth daily.    [provider]  cefdinir (OMNICEF) 300 MG capsule Take 1 capsule (300 mg total) by mouth 2 (two) times daily. 08/19/18   Isa Rankin, MD  Cetirizine HCl 10 MG CAPS Take 1 capsule (10 mg total) by mouth daily for 10 days. 12/05/18 12/15/18  Benny Deutschman C, PA-C  colchicine 0.6 MG tablet Take 0.5 tablets (0.3 mg total) by mouth 2 (two) times daily. 06/20/18   Danford, Earl Lites, MD  cycloSPORINE (RESTASIS) 0.05 % ophthalmic emulsion Place 1 drop into both eyes 2 (two) times daily. 10/29/17   [provider]  doxycycline (VIBRAMYCIN) 100 MG capsule Take 1 capsule (100 mg total) by mouth 2 (two) times daily for 10 days. 12/05/18 12/15/18  Runell Kovich C, PA-C  fluticasone (FLONASE) 50 MCG/ACT nasal spray Place 2 sprays into both nostrils as needed for allergies or rhinitis.    [provider]  Fluticasone-Salmeterol (ADVAIR) 100-50 MCG/DOSE AEPB Inhale 1 puff into the lungs 2 (two) times daily.    [provider]  glipiZIDE (GLUCOTROL) 10 MG tablet Take 10 mg by mouth 2 (two) times daily before a meal.    [provider]  hydrochlorothiazide (HYDRODIURIL) 25 MG tablet Take 25 mg by mouth daily as needed (swelling).    [provider]  LANTUS SOLOSTAR 100 UNIT/ML Solostar Pen Inject 50 Units into the skin at bedtime. 03/15/18   [provider]  methocarbamol (ROBAXIN) 500 MG tablet Take 1 tablet (500 mg total) by mouth 2 (two) times daily. 11/02/18   Mazin Emma C, PA-C  metoprolol tartrate (LOPRESSOR) 25 MG tablet TAKE  1 TABLET BY MOUTH TWICE DAILY 03/15/18   Marykay Lex, MD  Multiple Vitamins-Calcium (MULTI-DAY/CALCIUM/EXTRA IRON) TABS Take 1 tablet by mouth daily. 05/31/18   [provider]  predniSONE (STERAPRED UNI-PAK 21 TAB) 10 MG (21) TBPK tablet Taper as follows: take 6 tabs on day 1, then  5 tabs on day 2, then  4 tabs on day 3, then  3 tabs on day 4, then  2 tabs, then  1 tab then stop. 06/20/18   Danford, Earl Lites, MD  simvastatin (ZOCOR) 40 MG tablet Take 40 mg by mouth at bedtime. 02/01/18   [provider]  traMADol (ULTRAM) 50 MG tablet Take 1 tablet (50 mg total) by mouth every 12 (  twelve) hours as needed. 11/02/18   Domnique Vantine, Junius CreamerHallie C, PA-C    Family History Family History  Problem Relation Age of Onset  . Pancreatic cancer Mother   . CAD Mother        Open heart surgery 60s-70s  . Stroke Father   . Heart disease Sister        Unclear details    Social History Social History   Tobacco Use  . Smoking status: Never Smoker  . Smokeless tobacco: Never Used  Substance Use Topics  . Alcohol use: No  . Drug use: No     Allergies   Ace inhibitors; Lisinopril; Augmentin [amoxicillin-pot clavulanate]; Mushroom extract complex; and Tetanus toxoids   Review of Systems Review of Systems  Constitutional: Negative for activity change, appetite change, chills, fatigue and fever.  HENT: Positive for congestion, rhinorrhea, sinus pressure and sore throat. Negative for ear pain and trouble swallowing.   Eyes: Negative for discharge and redness.  Respiratory: Positive for cough. Negative for chest tightness and shortness of breath.   Cardiovascular: Negative for chest pain.  Gastrointestinal: Negative for abdominal pain, diarrhea, nausea and vomiting.  Musculoskeletal: Negative for myalgias.  Skin: Negative for rash.  Neurological: Negative for dizziness, light-headedness and headaches.     Physical Exam Triage Vital Signs ED Triage Vitals  Enc Vitals Group       BP 12/05/18 2045 (!) 150/78     Pulse Rate 12/05/18 2045 69     Resp 12/05/18 2045 16     Temp 12/05/18 2045 98.1 F (36.7 C)     Temp Source 12/05/18 2045 Oral     SpO2 12/05/18 2045 98 %     Weight 12/05/18 2046 187 lb (84.8 kg)     Height --      Head Circumference --      Peak Flow --      Pain Score 12/05/18 2046 6     Pain Loc --      Pain Edu? --      Excl. in GC? --    No data found.  Updated Vital Signs BP (!) 150/78 (BP Location: Right Arm)   Pulse 69   Temp 98.1 F (36.7 C) (Oral)   Resp 16   Wt 187 lb (84.8 kg)   SpO2 98%   BMI 34.20 kg/m   Visual Acuity Right Eye Distance:   Left Eye Distance:   Bilateral Distance:    Right Eye Near:   Left Eye Near:    Bilateral Near:     Physical Exam Vitals signs and nursing note reviewed.  Constitutional:      General: She is not in acute distress.    Appearance: She is well-developed.  HENT:     Head: Normocephalic and atraumatic.     Ears:     Comments: Bilateral ears without tenderness to palpation of external auricle, tragus and mastoid, EAC's without erythema or swelling, TM's with good bony landmarks and cone of light. Non erythematous.    Nose:     Comments: Nasal mucosa erythematous, swollen turbinates bilaterally, rhinorrhea present.    Mouth/Throat:     Comments: Oral mucosa pink and moist, no tonsillar enlargement or exudate. Posterior pharynx patent and nonerythematous, no uvula deviation or swelling. Normal phonation. Eyes:     Conjunctiva/sclera: Conjunctivae normal.  Neck:     Musculoskeletal: Neck supple.  Cardiovascular:     Rate and Rhythm: Normal rate and regular rhythm.     Heart  sounds: No murmur.  Pulmonary:     Effort: Pulmonary effort is normal. No respiratory distress.     Breath sounds: Normal breath sounds.     Comments: Breathing comfortably at rest, CTABL, no wheezing, rales or other adventitious sounds auscultated Abdominal:     Palpations: Abdomen is soft.      Tenderness: There is no abdominal tenderness.  Skin:    General: Skin is warm and dry.  Neurological:     Mental Status: She is alert.      UC Treatments / Results  Labs (all labs ordered are listed, but only abnormal results are displayed) Labs Reviewed - No data to display  EKG None  Radiology No results found.  Procedures Procedures (including critical care time)  Medications Ordered in UC Medications - No data to display  Initial Impression / Assessment and Plan / UC Course  I have reviewed the triage vital signs and the nursing notes.  Pertinent labs & imaging results that were available during my care of the patient were reviewed by me and considered in my medical decision making (see chart for details).     URI symptoms x2 weeks, vital signs stable, exam nonfocal.  Most likely sinusitis.  Will treat with doxycycline given length of symptoms, Augmentin allergy.  Recommended Zyrtec and Flonase as well.  Continue to monitor symptoms,Discussed strict return precautions. Patient verbalized understanding and is agreeable with plan.  Final Clinical Impressions(s) / UC Diagnoses   Final diagnoses:  Acute sinusitis with symptoms > 10 days     Discharge Instructions     I am treating you for a sinus infection Please begin doxycycline twice daily for the next 10 days Please restart Zyrtec May add in Flonase Drink plenty of fluids Follow-up if symptoms not resolving over the next week with starting the antibiotic.   ED Prescriptions    Medication Sig Dispense Auth. Provider   doxycycline (VIBRAMYCIN) 100 MG capsule Take 1 capsule (100 mg total) by mouth 2 (two) times daily for 10 days. 20 capsule Keedan Sample C, PA-C   Cetirizine HCl 10 MG CAPS Take 1 capsule (10 mg total) by mouth daily for 10 days. 20 capsule Nollie Shiflett C, PA-C     Controlled Substance Prescriptions [place] Controlled Substance Registry consulted? Not Applicable   Bengie Kaucher, HLew Dawesallie C,  [place]PA-C 12/08/18 1308

## 2018-12-09 ENCOUNTER — Encounter (HOSPITAL_COMMUNITY): Payer: Self-pay

## 2018-12-09 ENCOUNTER — Emergency Department (HOSPITAL_COMMUNITY)
Admission: EM | Admit: 2018-12-09 | Discharge: 2018-12-09 | Disposition: A | Discharge status: Home / Self Care | Payer: Medicare HMO | Attending: Emergency Medicine | Admitting: Emergency Medicine

## 2018-12-09 ENCOUNTER — Other Ambulatory Visit: Payer: Self-pay

## 2018-12-09 DIAGNOSIS — M25562 Pain in left knee: Secondary | ICD-10-CM | POA: Insufficient documentation

## 2018-12-09 DIAGNOSIS — Z7984 Long term (current) use of oral hypoglycemic drugs: Secondary | ICD-10-CM | POA: Insufficient documentation

## 2018-12-09 DIAGNOSIS — I252 Old myocardial infarction: Secondary | ICD-10-CM | POA: Diagnosis not present

## 2018-12-09 DIAGNOSIS — Z79899 Other long term (current) drug therapy: Secondary | ICD-10-CM | POA: Diagnosis not present

## 2018-12-09 DIAGNOSIS — M10362 Gout due to renal impairment, left knee: Secondary | ICD-10-CM

## 2018-12-09 DIAGNOSIS — I129 Hypertensive chronic kidney disease with stage 1 through stage 4 chronic kidney disease, or unspecified chronic kidney disease: Secondary | ICD-10-CM | POA: Diagnosis not present

## 2018-12-09 DIAGNOSIS — I2510 Atherosclerotic heart disease of native coronary artery without angina pectoris: Secondary | ICD-10-CM | POA: Diagnosis not present

## 2018-12-09 DIAGNOSIS — N183 Chronic kidney disease, stage 3 (moderate): Secondary | ICD-10-CM | POA: Diagnosis not present

## 2018-12-09 DIAGNOSIS — E1122 Type 2 diabetes mellitus with diabetic chronic kidney disease: Secondary | ICD-10-CM | POA: Diagnosis not present

## 2018-12-09 MED ORDER — OXYCODONE HCL 5 MG PO TABS
5.0000 mg | ORAL_TABLET | Freq: Once | ORAL | Status: AC
  Administered 2018-12-09: 5 mg via ORAL
  Filled 2018-12-09: qty 1

## 2018-12-09 MED ORDER — COLCHICINE 0.6 MG PO TABS
0.6000 mg | ORAL_TABLET | Freq: Once | ORAL | Status: AC
  Administered 2018-12-09: 0.6 mg via ORAL
  Filled 2018-12-09: qty 1

## 2018-12-09 MED ORDER — PREDNISONE 20 MG PO TABS
60.0000 mg | ORAL_TABLET | Freq: Once | ORAL | Status: AC
  Administered 2018-12-09: 60 mg via ORAL
  Filled 2018-12-09: qty 3

## 2018-12-09 MED ORDER — PREDNISONE 20 MG PO TABS: ORAL_TABLET | ORAL | 0 refills | Status: DC

## 2018-12-09 MED ORDER — COLCHICINE 0.6 MG PO TABS
1.2000 mg | ORAL_TABLET | Freq: Once | ORAL | Status: AC
  Administered 2018-12-09: 1.2 mg via ORAL
  Filled 2018-12-09: qty 2

## 2018-12-09 MED ORDER — ACETAMINOPHEN 500 MG PO TABS
1000.0000 mg | ORAL_TABLET | Freq: Once | ORAL | Status: AC
  Administered 2018-12-09: 1000 mg via ORAL
  Filled 2018-12-09: qty 2

## 2018-12-09 MED ORDER — MORPHINE SULFATE 15 MG PO TABS: 7.5000 mg | ORAL_TABLET | ORAL | 0 refills | Status: DC | PRN

## 2018-12-09 NOTE — ED Provider Notes (Signed)
[facility] EMERGENCY DEPARTMENT Provider Note   CSN: [id number] Arrival date & time: 12/09/18  1109     History   Chief Complaint Chief Complaint  Patient presents with  . Knee Pain    HPI Cheryl Gill is a 67 y.o. female.  67 yo F with a chief complaint of left knee pain.  Going on for the past couple days.  Feels like her prior gout flare.  She denies fevers or chills denies trauma.  Saw her doctor who started her on Uloric.  She has had no improvement of her symptoms.  Has had gout in this knee before.  Feels the same.  The history is provided by the patient.  Knee Pain   This is a recurrent problem. The current episode started 2 days ago. The problem occurs constantly. The problem has not changed since onset.The pain is present in the left knee. The quality of the pain is described as aching. The pain is at a severity of 6/10. The pain is moderate. The symptoms are aggravated by activity. She has tried nothing for the symptoms. The treatment provided no relief. There has been no history of extremity trauma.    Past Medical History:  Diagnosis Date  . Asthma   . CAD (coronary artery disease)    a. 12/2014: NSTEMI with cath showing Ostial LAD ~95%, mLAD 80-90%; EF ~40-45%, LAD disease compromised major D1 branch. CABG recommended and performed with LIMA-LAD and SVG-D1.   [name] Kitchen CKD (chronic kidney disease), stage III (HCC)   . Colitis   . Diabetes mellitus type 2, insulin dependent (HCC)   . Essential hypertension   . Hyperlipidemia with target LDL less than 70 01/28/2014  . Metabolic syndrome: DM, HTN, Obesity as well as HLD 12/18/2014  . NSTEMI (non-ST elevated myocardial infarction) (HCC) 12/18/2014   Echo 1/6: EF 60-65%, no Regional WMA, Gr 1 DD  . Obesity    BMI ~35-36  . 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 Seasonal allergies     Patient Active Problem List   Diagnosis Date Noted  . Acute gout 06/19/2018  . Left leg pain 06/18/2018  . CKD  (chronic kidney disease), stage III (HCC) 06/18/2018  . Joint pain 06/18/2018  . Effusion of left knee   . Colitis, acute 03/05/2015  . Colitis 03/05/2015  . Lower abdominal pain   . 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 Atherosclerotic heart disease of native coronary artery with angina pectoris (HCC) 12/19/2014  . NSTEMI (non-ST elevated myocardial infarction) (HCC) 12/18/2014  . Obesity, Class I, BMI 30-34.9 12/18/2014  . Metabolic syndrome: DM, HTN, Obesity as well as HLD 12/18/2014  . Frequency of urination 07/17/2014  . Nocturia more than twice per night 07/17/2014  . Abdominal pain 01/28/2014  . Asthma, chronic 01/28/2014  . Hyperlipidemia with target LDL less than 70 01/28/2014  . Leukocytosis 01/28/2014  . Normocytic anemia 01/18/2013  . 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 Diabetes mellitus type 2, uncontrolled (HCC) 01/16/2013  . Seasonal allergies     Past Surgical History:  Procedure Laterality Date  . ABDOMINAL HYSTERECTOMY    . CORONARY ARTERY BYPASS GRAFT N/A 12/21/2014   Procedure: CORONARY ARTERY BYPASS GRAFTING (CABG) TIMES TWO USING LEFT INTERNAL MAMMARY AND RIGHT SAPHENOUS LEG VEIN HARVESTED ENDOSCOPICALLY;  Surgeon: Loreli Slot, MD;  Location: [facility] OR;  Service: Open Heart Surgery;  Laterality: N/A;  . LEFT  HEART CATHETERIZATION WITH CORONARY ANGIOGRAM N/A 12/18/2014   Procedure: LEFT HEART CATHETERIZATION WITH CORONARY ANGIOGRAM;  Surgeon: Peter M [name], MD;  [facility];  Service: Cardiovascular;  Laterality: N/A;  . NM MYOCAR PERF WALL MOTION  04/09/2010   Abnormal study - appears to be a small area of apical infarction. Reversible ischemia is not seen.  . TEE WITHOUT CARDIOVERSION N/A 12/21/2014   Procedure: TRANSESOPHAGEAL ECHOCARDIOGRAM (TEE);  Surgeon: Loreli SlotSteven C Hendrickson, MD;  [facility];  Service: Open Heart Surgery;  Laterality: N/A;  . TONSILLECTOMY    . TRANSTHORACIC  ECHOCARDIOGRAM  12/2014   LVEF 60-65%, normal wall thickness, normal wall motion, diastolic dysfunction, indeterminate LV filling pressure, normal LA size.     OB History    Gravida  5   Para  4   Term  4   Preterm      AB  1   Living  4     SAB  1   TAB      Ectopic      Multiple      Live Births  4            Home Medications    Prior to Admission medications   Medication Sig Start Date End Date Taking? Authorizing Provider  albuterol (PROVENTIL HFA;VENTOLIN HFA) 108 (90 BASE) MCG/ACT inhaler Inhale 2 puffs into the lungs every 6 (six) hours as needed. For shortness of breath or wheezing    [provider]  aspirin EC 81 MG tablet Take 81 mg by mouth daily.    [provider]  cefdinir (OMNICEF) 300 MG capsule Take 1 capsule (300 mg total) by mouth 2 (two) times daily. 08/19/18   Isa RankinMurray, Laura Wilson, MD  Cetirizine HCl 10 MG CAPS Take 1 capsule (10 mg total) by mouth daily for 10 days. 12/05/18 12/15/18  Wieters, Hallie C, PA-C  colchicine 0.6 MG tablet Take 0.5 tablets (0.3 mg total) by mouth 2 (two) times daily. 06/20/18   Danford, Earl Liteshristopher P, MD  cycloSPORINE (RESTASIS) 0.05 % ophthalmic emulsion Place 1 drop into both eyes 2 (two) times daily. 10/29/17   [provider]  doxycycline (VIBRAMYCIN) 100 MG capsule Take 1 capsule (100 mg total) by mouth 2 (two) times daily for 10 days. 12/05/18 12/15/18  Wieters, Hallie C, PA-C  fluticasone (FLONASE) 50 MCG/ACT nasal spray Place 2 sprays into both nostrils as needed for allergies or rhinitis.    [provider]  Fluticasone-Salmeterol (ADVAIR) 100-50 MCG/DOSE AEPB Inhale 1 puff into the lungs 2 (two) times daily.    [provider]  glipiZIDE (GLUCOTROL) 10 MG tablet Take 10 mg by mouth 2 (two) times daily before a meal.    [provider]  hydrochlorothiazide (HYDRODIURIL) 25 MG tablet Take 25 mg by mouth daily as needed (swelling).    [provider]    LANTUS SOLOSTAR 100 UNIT/ML Solostar Pen Inject 50 Units into the skin at bedtime. 03/15/18   [provider]  methocarbamol (ROBAXIN) 500 MG tablet Take 1 tablet (500 mg total) by mouth 2 (two) times daily. 11/02/18   Wieters, Hallie C, PA-C  metoprolol tartrate (LOPRESSOR) 25 MG tablet TAKE 1 TABLET BY MOUTH TWICE DAILY 03/15/18   Marykay LexHarding, David W, MD  morphine (MSIR) 15 MG tablet Take 0.5 tablets (7.5 mg total) by mouth every 4 (four) hours as needed for severe pain. 12/09/18   Melene PlanFloyd, Alasia Enge, DO  Multiple Vitamins-Calcium (MULTI-DAY/CALCIUM/EXTRA IRON) TABS Take 1 tablet by  mouth daily. 05/31/18   [provider]  predniSONE (DELTASONE) 20 MG tablet 2 tabs po daily x 4 days 12/09/18   Melene Plan, DO  simvastatin (ZOCOR) 40 MG tablet Take 40 mg by mouth at bedtime. 02/01/18   [provider]  traMADol (ULTRAM) 50 MG tablet Take 1 tablet (50 mg total) by mouth every 12 (twelve) hours as needed. 11/02/18   Wieters, Junius Creamer, PA-C    Family History Family History  Problem Relation Age of Onset  . Pancreatic cancer Mother   . CAD Mother        Open heart surgery 60s-70s  . Stroke Father   . Heart disease Sister        Unclear details    Social History Social History   Tobacco Use  . Smoking status: Never Smoker  . Smokeless tobacco: Never Used  Substance Use Topics  . Alcohol use: No  . Drug use: No     Allergies   Ace inhibitors; Lisinopril; Augmentin [amoxicillin-pot clavulanate]; Mushroom extract complex; and Tetanus toxoids   Review of Systems Review of Systems  Constitutional: Negative for chills and fever.  HENT: Negative for congestion and rhinorrhea.   Eyes: Negative for redness and visual disturbance.  Respiratory: Negative for shortness of breath and wheezing.   Cardiovascular: Negative for chest pain and palpitations.  Gastrointestinal: Negative for nausea and vomiting.  Genitourinary: Negative for dysuria and urgency.  Musculoskeletal:  Positive for arthralgias and joint swelling. Negative for myalgias.  Skin: Negative for pallor and wound.  Neurological: Negative for dizziness and headaches.     Physical Exam Updated Vital Signs BP (!) 145/83 (BP Location: Right Arm)   Pulse 90   Temp 98.7 F (37.1 C) (Oral)   Resp 18   SpO2 97%   Physical Exam Vitals signs and nursing note reviewed.  Constitutional:      General: She is not in acute distress.    Appearance: She is well-developed. She is not diaphoretic.  HENT:     Head: Normocephalic and atraumatic.  Eyes:     Pupils: Pupils are equal, round, and reactive to light.  Neck:     Musculoskeletal: Normal range of motion and neck supple.  Cardiovascular:     Rate and Rhythm: Normal rate and regular rhythm.     Heart sounds: No murmur. No friction rub. No gallop.   Pulmonary:     Effort: Pulmonary effort is normal.     Breath sounds: No wheezing or rales.  Abdominal:     General: There is no distension.     Palpations: Abdomen is soft.     Tenderness: There is no abdominal tenderness.  Musculoskeletal:        General: No tenderness.     Comments: Mild edema, to the left knee range of motion with some pain.  No signs of trauma.  No erythema.  Pulse motor and sensation is intact distally.  Skin:    General: Skin is warm and dry.  Neurological:     Mental Status: She is alert and oriented to person, place, and time.  Psychiatric:        Behavior: Behavior normal.      ED Treatments / Results  Labs (all labs ordered are listed, but only abnormal results are displayed) Labs Reviewed - No data to display  EKG None  Radiology No results found.  Procedures Procedures (including critical care time)  Medications Ordered in ED Medications  colchicine tablet 1.2 mg (  1.2 mg Oral Given 12/09/18 1150)  acetaminophen (TYLENOL) tablet 1,000 mg (1,000 mg Oral Given 12/09/18 1151)  oxyCODONE (Oxy IR/ROXICODONE) immediate release tablet 5 mg (5 mg Oral  Given 12/09/18 1151)  predniSONE (DELTASONE) tablet 60 mg (60 mg Oral Given 12/09/18 1151)  colchicine tablet 0.6 mg (0.6 mg Oral Given 12/09/18 1156)     Initial Impression / Assessment and Plan / ED Course  I have reviewed the triage vital signs and the nursing notes.  Pertinent labs & imaging results that were available during my care of the patient were reviewed by me and considered in my medical decision making (see chart for details).     67 yo F with a chief complaint of left knee pain.  Feels like a prior gout flare.  Has had pain in that knee previously that was diagnosed as gout.  Was placed on Uloric by the PCP without any improvement.  We will treat his gout.  Give the 2 doses of colchicine.  Started on prednisone.  Short course of narcotics.  PCP follow-up.  11:59 AM:  I have discussed the diagnosis/risks/treatment options with the patient and believe the pt to be eligible for discharge home to follow-up with PCP. We also discussed returning to the ED immediately if new or worsening sx occur. We discussed the sx which are most concerning (e.g., sudden worsening pain, fever, inability to tolerate by mouth) that necessitate immediate return. Medications administered to the patient during their visit and any new prescriptions provided to the patient are listed below.  Medications given during this visit Medications  colchicine tablet 1.2 mg (1.2 mg Oral Given 12/09/18 1150)  acetaminophen (TYLENOL) tablet 1,000 mg (1,000 mg Oral Given 12/09/18 1151)  oxyCODONE (Oxy IR/ROXICODONE) immediate release tablet 5 mg (5 mg Oral Given 12/09/18 1151)  predniSONE (DELTASONE) tablet 60 mg (60 mg Oral Given 12/09/18 1151)  colchicine tablet 0.6 mg (0.6 mg Oral Given 12/09/18 1156)      The patient appears reasonably screen and/or stabilized for discharge and I doubt any other medical condition or other [facility] requiring further screening, evaluation, or treatment in the ED at this time prior to  discharge.    Final Clinical Impressions(s) / ED Diagnoses   Final diagnoses:  Acute gout due to renal impairment involving left knee    ED Discharge Orders         Ordered    predniSONE (DELTASONE) 20 MG tablet     12/09/18 1151    morphine (MSIR) 15 MG tablet  Every 4 hours PRN     12/09/18 1151           Melene PlanFloyd, Tavonte Seybold, DO 12/09/18 1159

## 2018-12-09 NOTE — Discharge Instructions (Addendum)
Return for fever, or worsening pain.   Take tylenol 1000mg (2 extra strength) four times a day. Take the colchicine tablet about 1 hour after you took the first dose  Then take the pain medicine if you feel like you need it. Narcotics do not help with the pain, they only make you care about it less.  You can become addicted to this, people may break into your house to steal it.  It will constipate you.  If you drive under the influence of this medicine you can get a DUI.

## 2018-12-09 NOTE — ED Triage Notes (Signed)
Pt complains of knee pain for several days, denies injury. Hx of gout and fluid on the knee. Some swelling noted to left knee.

## 2018-12-28 ENCOUNTER — Ambulatory Visit: Payer: Medicare HMO

## 2019-01-03 DIAGNOSIS — E1321 Other specified diabetes mellitus with diabetic nephropathy: Secondary | ICD-10-CM | POA: Diagnosis not present

## 2019-01-03 DIAGNOSIS — Z0000 Encounter for general adult medical examination without abnormal findings: Secondary | ICD-10-CM | POA: Diagnosis not present

## 2019-01-03 DIAGNOSIS — I10 Essential (primary) hypertension: Secondary | ICD-10-CM | POA: Diagnosis not present

## 2019-01-03 DIAGNOSIS — E7849 Other hyperlipidemia: Secondary | ICD-10-CM | POA: Diagnosis not present

## 2019-01-03 DIAGNOSIS — M1A9XX0 Chronic gout, unspecified, without tophus (tophi): Secondary | ICD-10-CM | POA: Diagnosis not present

## 2019-01-18 ENCOUNTER — Ambulatory Visit (HOSPITAL_COMMUNITY)
Admission: EM | Admit: 2019-01-18 | Discharge: 2019-01-18 | Disposition: A | Discharge status: Home / Self Care | Payer: 59 | Attending: Emergency Medicine | Admitting: Emergency Medicine

## 2019-01-18 ENCOUNTER — Encounter (HOSPITAL_COMMUNITY): Payer: Self-pay | Admitting: Emergency Medicine

## 2019-01-18 ENCOUNTER — Other Ambulatory Visit: Payer: Self-pay

## 2019-01-18 DIAGNOSIS — J069 Acute upper respiratory infection, unspecified: Secondary | ICD-10-CM

## 2019-01-18 MED ORDER — AEROCHAMBER PLUS MISC: 2 refills | Status: AC

## 2019-01-18 MED ORDER — ALBUTEROL SULFATE HFA 108 (90 BASE) MCG/ACT IN AERS: 2.0000 | INHALATION_SPRAY | RESPIRATORY_TRACT | 0 refills | Status: DC | PRN

## 2019-01-18 MED ORDER — DOXYCYCLINE HYCLATE 100 MG PO CAPS: 100.0000 mg | ORAL_CAPSULE | Freq: Two times a day (BID) | ORAL | 0 refills | Status: AC

## 2019-01-18 NOTE — ED Notes (Signed)
Patient able to ambulate independently  

## 2019-01-18 NOTE — ED Triage Notes (Signed)
Had and chest congestion for 2 weeks.  Phlegm is clear to white and thick.  Patient has a cough

## 2019-01-18 NOTE — Discharge Instructions (Addendum)
Finish the doxycycline and if you feel better.  2 puffs from your albuterol inhaler with your spacer on a regular basis-every 4-6 hours - for the next several days. Continue Flonase, start saline nasal irrigation with a Lloyd Huger med rinse and distilled water as often as you want, discontinue Zyrtec for now, continue Mucinex.

## 2019-01-18 NOTE — ED Provider Notes (Signed)
HPI  SUBJECTIVE:  Cheryl HasteGlenda Gill is a 68 y.o. female who presents with clear nasal congestion, congestion, cough productive of white sputum, wheezing for the past 3 weeks.  States it started off as a URI that never resolved.  No fevers, sinus pain or pressure, postnasal drip, chest pain, shortness of breath, dyspnea on exertion.  No upper dental pain, facial swelling.  She states that she has constant allergies.  Unable to sleep at night secondary to the cough.  She was on an antibiotic last month for a sinus infection.  No antipyretic in the past 4 to 6 hours.  She tried Mucinex DM, Flonase, and her albuterol inhaler once.  She states that the Mucinex and albuterol helped.  No aggravating factors.  She denies lower extremity edema, orthopnea, PND, nocturia, abdominal pain.  She has a past medical history of chronic kidney disease stage III, and STEMI, CABG x2, diabetes, asthma, hypercholesterolemia, allergies, hypertension.  No history of CHF.  ZOX:[id number]:[name], Dorma RussellEdwin, MD  Past Medical History:  Diagnosis Date  . Asthma   . CAD (coronary artery disease)    a. 12/2014: NSTEMI with cath showing Ostial LAD ~95%, mLAD 80-90%; EF ~40-45%, LAD disease compromised major D1 branch. CABG recommended and performed with LIMA-LAD and SVG-D1.   [name] Kitchen. CKD (chronic kidney disease), stage III (HCC)   . Colitis   . Diabetes mellitus type 2, insulin dependent (HCC)   . Essential hypertension   . Hyperlipidemia with target LDL less than 70 01/28/2014  . Metabolic syndrome: DM, HTN, Obesity as well as HLD 12/18/2014  . NSTEMI (non-ST elevated myocardial infarction) (HCC) 12/18/2014   Echo 1/6: EF 60-65%, no Regional WMA, Gr 1 DD  . Obesity    BMI ~35-36  . 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 Seasonal allergies     Past Surgical History:  Procedure Laterality Date  . ABDOMINAL HYSTERECTOMY    . CORONARY ARTERY BYPASS GRAFT N/A 12/21/2014   Procedure: CORONARY ARTERY BYPASS GRAFTING (CABG) TIMES TWO USING LEFT  INTERNAL MAMMARY AND RIGHT SAPHENOUS LEG VEIN HARVESTED ENDOSCOPICALLY;  Surgeon: Loreli SlotSteven C Hendrickson, MD;  [facility];  Service: Open Heart Surgery;  Laterality: N/A;  . LEFT HEART CATHETERIZATION WITH CORONARY ANGIOGRAM N/A 12/18/2014   Procedure: LEFT HEART CATHETERIZATION WITH CORONARY ANGIOGRAM;  Surgeon: Peter M [name], MD;  [facility];  Service: Cardiovascular;  Laterality: N/A;  . NM MYOCAR PERF WALL MOTION  04/09/2010   Abnormal study - appears to be a small area of apical infarction. Reversible ischemia is not seen.  . TEE WITHOUT CARDIOVERSION N/A 12/21/2014   Procedure: TRANSESOPHAGEAL ECHOCARDIOGRAM (TEE);  Surgeon: Loreli SlotSteven C Hendrickson, MD;  [facility];  Service: Open Heart Surgery;  Laterality: N/A;  . TONSILLECTOMY    . TRANSTHORACIC ECHOCARDIOGRAM  12/2014   LVEF 60-65%, normal wall thickness, normal wall motion, diastolic dysfunction, indeterminate LV filling pressure, normal LA size.    Family History  Problem Relation Age of Onset  . Pancreatic cancer Mother   . CAD Mother        Open heart surgery 60s-70s  . Stroke Father   . Heart disease Sister        Unclear details    Social History   Tobacco Use  . Smoking status: Never Smoker  . Smokeless tobacco: Never Used  Substance Use Topics  . Alcohol use: No  . Drug use: No    No current facility-administered medications for this encounter.  Current Outpatient Medications:  .  aspirin EC 81 MG tablet, Take 81 mg by mouth daily., Disp: , Rfl:  .  colchicine 0.6 MG tablet, Take 0.5 tablets (0.3 mg total) by mouth 2 (two) times daily., Disp: 30 tablet, Rfl: 3 .  cycloSPORINE (RESTASIS) 0.05 % ophthalmic emulsion, Place 1 drop into both eyes 2 (two) times daily., Disp: , Rfl:  .  fluticasone (FLONASE) 50 MCG/ACT nasal spray, Place 2 sprays into both nostrils as needed for allergies or rhinitis., Disp: , Rfl:  .  Fluticasone-Salmeterol (ADVAIR) 100-50 MCG/DOSE AEPB, Inhale 1 puff into the lungs 2  (two) times daily., Disp: , Rfl:  .  glipiZIDE (GLUCOTROL) 10 MG tablet, Take 10 mg by mouth 2 (two) times daily before a meal., Disp: , Rfl:  .  hydrochlorothiazide (HYDRODIURIL) 25 MG tablet, Take 25 mg by mouth daily as needed (swelling)., Disp: , Rfl:  .  LANTUS SOLOSTAR 100 UNIT/ML Solostar Pen, Inject 50 Units into the skin at bedtime., Disp: , Rfl: 5 .  metoprolol tartrate (LOPRESSOR) 25 MG tablet, TAKE 1 TABLET BY MOUTH TWICE DAILY, Disp: 180 tablet, Rfl: 1 .  Multiple Vitamins-Calcium (MULTI-DAY/CALCIUM/EXTRA IRON) TABS, Take 1 tablet by mouth daily., Disp: , Rfl:  .  albuterol (PROVENTIL HFA;VENTOLIN HFA) 108 (90 Base) MCG/ACT inhaler, Inhale 2 puffs into the lungs every 4 (four) hours as needed. For shortness of breath or wheezing, Disp: 1 Inhaler, Rfl: 0 .  doxycycline (VIBRAMYCIN) 100 MG capsule, Take 1 capsule (100 mg total) by mouth 2 (two) times daily for 7 days., Disp: 14 capsule, Rfl: 0 .  methocarbamol (ROBAXIN) 500 MG tablet, Take 1 tablet (500 mg total) by mouth 2 (two) times daily., Disp: 20 tablet, Rfl: 0 .  morphine (MSIR) 15 MG tablet, Take 0.5 tablets (7.5 mg total) by mouth every 4 (four) hours as needed for severe pain., Disp: 4 tablet, Rfl: 0 .  simvastatin (ZOCOR) 40 MG tablet, Take 40 mg by mouth at bedtime., Disp: , Rfl:  .  Spacer/Aero-Holding Chambers (AEROCHAMBER PLUS) inhaler, Use as instructed, Disp: 1 each, Rfl: 2 .  traMADol (ULTRAM) 50 MG tablet, Take 1 tablet (50 mg total) by mouth every 12 (twelve) hours as needed., Disp: 8 tablet, Rfl: 0  Allergies  Allergen Reactions  . Ace Inhibitors Anaphylaxis  . Lisinopril Anaphylaxis  . Augmentin [Amoxicillin-Pot Clavulanate]     "diarrhea"  . Mushroom Extract Complex Swelling  . Tetanus Toxoids Nausea And Vomiting and Swelling     ROS  As noted in HPI.   Physical Exam  BP 123/77 (BP Location: Right Arm)   Pulse 69   Temp 98.2 F (36.8 C) (Temporal)   Resp 20   SpO2 99%   Constitutional: Well  developed, well nourished, no acute distress Eyes:  EOMI, conjunctiva normal bilaterally HENT: Normocephalic, atraumatic,mucus membranes moist.  No maxillary, frontal sinus tenderness.  Positive clear nasal congestion with friable, swollen turbinates.  Positive cobblestoning.  No postnasal drip. Respiratory: Normal inspiratory effort, lungs clear bilaterally.  No chest wall tenderness Cardiovascular: Normal rate rhythm, no murmurs GI: nondistended skin: No rash, skin intact Musculoskeletal: no deformities.  trace edema bilateral lower extremities.  Neurologic: Alert & oriented x 3, no focal neuro deficits Psychiatric: Speech and behavior appropriate   ED Course   Medications - No data to display  No orders of the defined types were placed in this encounter.   No results found for this or any previous visit (from the past 24 hour(s)). No  results found.  ED Clinical Impression  Upper respiratory tract infection, unspecified type   ED Assessment/Plan  Patient with URI symptoms for 3 weeks.  She does not have any sinus tenderness consistent with a sinusitis, and her lungs are clear today, however, given duration of symptoms and multiple medical comorbidities, will send her home with doxycycline.  Patient has no signs or symptoms of CHF.  We will have her do regular albuterol 2 puffs every 4-6 hours using her spacer for the next several days.  Continue Flonase, start saline nasal irrigation with a Lloyd Huger med rinse and distilled water as often as she wants, discontinue Zyrtec for now, continue Mucinex.  Follow-up with PMD in several days, to the ER if she gets worse.   Discussed MDM, treatment plan, and plan for follow-up with patient. Discussed sn/sx that should prompt return to the ED. patient agrees with plan.   Meds ordered this encounter  Medications  . albuterol (PROVENTIL HFA;VENTOLIN HFA) 108 (90 Base) MCG/ACT inhaler    Sig: Inhale 2 puffs into the lungs every 4 (four) hours as  needed. For shortness of breath or wheezing    Dispense:  1 Inhaler    Refill:  0  . Spacer/Aero-Holding Chambers (AEROCHAMBER PLUS) inhaler    Sig: Use as instructed    Dispense:  1 each    Refill:  2  . doxycycline (VIBRAMYCIN) 100 MG capsule    Sig: Take 1 capsule (100 mg total) by mouth 2 (two) times daily for 7 days.    Dispense:  14 capsule    Refill:  0    *This clinic note was created using Scientist, clinical (histocompatibility and immunogenetics). Therefore, there may be occasional mistakes despite careful proofreading.   ?   Domenick Gong, MD 01/19/19 [phone]

## 2019-02-07 DIAGNOSIS — N183 Chronic kidney disease, stage 3 (moderate): Secondary | ICD-10-CM | POA: Diagnosis not present

## 2019-02-07 DIAGNOSIS — M109 Gout, unspecified: Secondary | ICD-10-CM | POA: Diagnosis not present

## 2019-02-07 DIAGNOSIS — I129 Hypertensive chronic kidney disease with stage 1 through stage 4 chronic kidney disease, or unspecified chronic kidney disease: Secondary | ICD-10-CM | POA: Diagnosis not present

## 2019-02-07 DIAGNOSIS — N189 Chronic kidney disease, unspecified: Secondary | ICD-10-CM | POA: Diagnosis not present

## 2019-02-07 DIAGNOSIS — D631 Anemia in chronic kidney disease: Secondary | ICD-10-CM | POA: Diagnosis not present

## 2019-02-10 DIAGNOSIS — Z111 Encounter for screening for respiratory tuberculosis: Secondary | ICD-10-CM | POA: Diagnosis not present

## 2019-02-10 DIAGNOSIS — I10 Essential (primary) hypertension: Secondary | ICD-10-CM | POA: Diagnosis not present

## 2019-02-10 DIAGNOSIS — J4520 Mild intermittent asthma, uncomplicated: Secondary | ICD-10-CM | POA: Diagnosis not present

## 2019-02-10 DIAGNOSIS — E1321 Other specified diabetes mellitus with diabetic nephropathy: Secondary | ICD-10-CM | POA: Diagnosis not present

## 2019-03-24 ENCOUNTER — Other Ambulatory Visit: Payer: Self-pay

## 2019-03-24 ENCOUNTER — Ambulatory Visit (HOSPITAL_COMMUNITY)
Admission: EM | Admit: 2019-03-24 | Discharge: 2019-03-24 | Disposition: A | Discharge status: Home / Self Care | Payer: Medicare HMO | Attending: Emergency Medicine | Admitting: Emergency Medicine

## 2019-03-24 ENCOUNTER — Encounter (HOSPITAL_COMMUNITY): Payer: Self-pay | Admitting: Emergency Medicine

## 2019-03-24 DIAGNOSIS — M25512 Pain in left shoulder: Secondary | ICD-10-CM

## 2019-03-24 DIAGNOSIS — M5442 Lumbago with sciatica, left side: Secondary | ICD-10-CM

## 2019-03-24 MED ORDER — DICLOFENAC SODIUM 1 % TD GEL: 2.0000 g | Freq: Four times a day (QID) | TRANSDERMAL | 0 refills | Status: DC

## 2019-03-24 MED ORDER — TIZANIDINE HCL 2 MG PO TABS: 2.0000 mg | ORAL_TABLET | Freq: Every day | ORAL | 0 refills | Status: DC
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
## 2019-03-24 NOTE — Discharge Instructions (Addendum)
Light and regular activity as tolerated, see exercises provided.  Zanaflex at night as a muscle relaxer.  Voltaren gel to shoulder and to low back as an antiinflammatory.  May take tramadol and acetaminophen as needed.  Please follow up with your primary care provider if symptoms persist.
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
## 2019-04-06 DIAGNOSIS — E1321 Other specified diabetes mellitus with diabetic nephropathy: Secondary | ICD-10-CM | POA: Diagnosis not present

## 2019-04-06 DIAGNOSIS — I10 Essential (primary) hypertension: Secondary | ICD-10-CM | POA: Diagnosis not present

## 2019-04-06 DIAGNOSIS — I2510 Atherosclerotic heart disease of native coronary artery without angina pectoris: Secondary | ICD-10-CM | POA: Diagnosis not present

## 2019-06-06 ENCOUNTER — Ambulatory Visit (HOSPITAL_COMMUNITY)
Admission: EM | Admit: 2019-06-06 | Discharge: 2019-06-06 | Disposition: A | Discharge status: Home / Self Care | Payer: Medicare HMO | Attending: Family Medicine | Admitting: Family Medicine

## 2019-06-06 ENCOUNTER — Telehealth (HOSPITAL_COMMUNITY): Payer: Self-pay | Admitting: Emergency Medicine

## 2019-06-06 ENCOUNTER — Other Ambulatory Visit: Payer: Self-pay

## 2019-06-06 ENCOUNTER — Encounter (HOSPITAL_COMMUNITY): Payer: Self-pay

## 2019-06-06 DIAGNOSIS — R5383 Other fatigue: Secondary | ICD-10-CM | POA: Diagnosis not present

## 2019-06-06 DIAGNOSIS — G514 Facial myokymia: Secondary | ICD-10-CM | POA: Diagnosis not present

## 2019-06-06 LAB — BASIC METABOLIC PANEL
Anion gap: 11 (ref 5–15)
BUN: 30 mg/dL — ABNORMAL HIGH (ref 8–23)
CO2: 23 mmol/L (ref 22–32)
Calcium: 9.3 mg/dL (ref 8.9–10.3)
Chloride: 106 mmol/L (ref 98–111)
Creatinine, Ser: 1.66 mg/dL — ABNORMAL HIGH (ref 0.44–1.00)
GFR calc Af Amer: 36 mL/min — ABNORMAL LOW (ref >60)
GFR calc non Af Amer: 31 mL/min — ABNORMAL LOW (ref >60)
Glucose, Bld: 217 mg/dL — ABNORMAL HIGH (ref 70–99)
Potassium: 4.5 mmol/L (ref 3.5–5.1)
Sodium: 140 mmol/L (ref 135–145)

## 2019-06-06 LAB — CBC
HCT: 38.5 % (ref 36.0–46.0)
Hemoglobin: 13.1 g/dL (ref 12.0–15.0)
MCH: 32.3 pg (ref 26.0–34.0)
MCHC: 34 g/dL (ref 30.0–36.0)
MCV: 95.1 fL (ref 80.0–100.0)
Platelets: 233 10*3/uL (ref 150–400)
RBC: 4.05 MIL/uL (ref 3.87–5.11)
RDW: 12 % (ref 11.5–15.5)
WBC: 12.4 10*3/uL — ABNORMAL HIGH (ref 4.0–10.5)
nRBC: 0 % (ref 0.0–0.2)

## 2019-06-06 NOTE — ED Triage Notes (Signed)
Pt states she has been having facial switches on the left side and now she feels very fatigued .

## 2019-06-06 NOTE — Telephone Encounter (Signed)
Patient called to review lab work.  hemoglobin stable.  White count 12.4, slightly elevated.  Advised if her fatigue/off-balance sensation worsens to follow-up here in emergency room.  Blood sugar elevated, currently on insulin and glipizide, she monitors her blood sugars daily, continue to monitor.  Follow-up with PCP for diabetes.  Developing nausea vomiting worsening fatigue to follow-up.  Kidney function slightly improved from previous readings.  Continue with plan discussed at visit, follow-up if symptoms worsening or changing.

## 2019-06-06 NOTE — ED Provider Notes (Signed)
[facility]    CSN: [id number] Arrival date & time: 06/06/19  1250      History   Chief Complaint Chief Complaint  Patient presents with  . Fatigue    HPI Cheryl Gill is a 68 y.o. female history of CAD, asthma, CKD, DM type II, hypertension, hyperlipidemia, presenting today for evaluation of facial twitching and fatigue.  Patient states that over the past couple weeks she has noticed facial twitching on the left side of her face.  States that it is intermittent.  Does not happen daily.  Denies associated pain.  Denies associated headache or vision changes.  Denies difficulty speaking.  Today she has had increased fatigue.  Has felt slightly off balance, but denies dizziness or lightheadedness.  Denies chest pain or shortness of breath.  She is mainly concerned about the facial twitching.  She denies changes in diet.  Denies changes in medicines.  HPI  Past Medical History:  Diagnosis Date  . Asthma   . CAD (coronary artery disease)    a. 12/2014: NSTEMI with cath showing Ostial LAD ~95%, mLAD 80-90%; EF ~40-45%, LAD disease compromised major D1 branch. CABG recommended and performed with LIMA-LAD and SVG-D1.   [name] Kitchen. CKD (chronic kidney disease), stage III (HCC)   . Colitis   . Diabetes mellitus type 2, insulin dependent (HCC)   . Essential hypertension   . Hyperlipidemia with target LDL less than 70 01/28/2014  . Metabolic syndrome: DM, HTN, Obesity as well as HLD 12/18/2014  . NSTEMI (non-ST elevated myocardial infarction) (HCC) 12/18/2014   Echo 1/6: EF 60-65%, no Regional WMA, Gr 1 DD  . Obesity    BMI ~35-36  . 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 Seasonal allergies     Patient Active Problem List   Diagnosis Date Noted  . Acute gout 06/19/2018  . Left leg pain 06/18/2018  . CKD (chronic kidney disease), stage III (HCC) 06/18/2018  . Joint pain 06/18/2018  . Effusion of left knee   . Colitis, acute 03/05/2015  . Colitis 03/05/2015  . Lower abdominal  pain   . 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 Atherosclerotic heart disease of native coronary artery with angina pectoris (HCC) 12/19/2014  . NSTEMI (non-ST elevated myocardial infarction) (HCC) 12/18/2014  . Obesity, Class I, BMI 30-34.9 12/18/2014  . Metabolic syndrome: DM, HTN, Obesity as well as HLD 12/18/2014  . Frequency of urination 07/17/2014  . Nocturia more than twice per night 07/17/2014  . Abdominal pain 01/28/2014  . Asthma, chronic 01/28/2014  . Hyperlipidemia with target LDL less than 70 01/28/2014  . Leukocytosis 01/28/2014  . Normocytic anemia 01/18/2013  . 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 Diabetes mellitus type 2, uncontrolled (HCC) 01/16/2013  . Seasonal allergies     Past Surgical History:  Procedure Laterality Date  . ABDOMINAL HYSTERECTOMY    . CORONARY ARTERY BYPASS GRAFT N/A 12/21/2014   Procedure: CORONARY ARTERY BYPASS GRAFTING (CABG) TIMES TWO USING LEFT INTERNAL MAMMARY AND RIGHT SAPHENOUS LEG VEIN HARVESTED ENDOSCOPICALLY;  Surgeon: Loreli SlotSteven C Hendrickson, MD;  [facility];  Service: Open Heart Surgery;  Laterality: N/A;  . LEFT HEART CATHETERIZATION WITH CORONARY ANGIOGRAM N/A 12/18/2014   Procedure: LEFT HEART CATHETERIZATION WITH CORONARY ANGIOGRAM;  Surgeon: Peter M [name], MD;  [facility];  Service: Cardiovascular;  Laterality: N/A;  . NM MYOCAR PERF WALL MOTION  04/09/2010   Abnormal study -  appears to be a small area of apical infarction. Reversible ischemia is not seen.  . TEE WITHOUT CARDIOVERSION N/A 12/21/2014   Procedure: TRANSESOPHAGEAL ECHOCARDIOGRAM (TEE);  Surgeon: Loreli SlotSteven C Hendrickson, MD;  [facility];  Service: Open Heart Surgery;  Laterality: N/A;  . TONSILLECTOMY    . TRANSTHORACIC ECHOCARDIOGRAM  12/2014   LVEF 60-65%, normal wall thickness, normal wall motion, diastolic dysfunction, indeterminate LV filling pressure, normal LA size.    OB History     Gravida  5   Para  4   Term  4   Preterm      AB  1   Living  4     SAB  1   TAB      Ectopic      Multiple      Live Births  4            Home Medications    Prior to Admission medications   Medication Sig Start Date End Date Taking? Authorizing Provider  albuterol (PROVENTIL HFA;VENTOLIN HFA) 108 (90 Base) MCG/ACT inhaler Inhale 2 puffs into the lungs every 4 (four) hours as needed. For shortness of breath or wheezing 01/18/19   Domenick GongMortenson, Ashley, MD  aspirin EC 81 MG tablet Take 81 mg by mouth daily.    [provider]  colchicine 0.6 MG tablet Take 0.5 tablets (0.3 mg total) by mouth 2 (two) times daily. 06/20/18   Danford, Earl Liteshristopher P, MD  cycloSPORINE (RESTASIS) 0.05 % ophthalmic emulsion Place 1 drop into both eyes 2 (two) times daily. 10/29/17   [provider]  diclofenac sodium (VOLTAREN) 1 % GEL Apply 2 g topically 4 (four) times daily. 03/24/19   Georgetta HaberBurky, Natalie B, NP  fluticasone (FLONASE) 50 MCG/ACT nasal spray Place 2 sprays into both nostrils as needed for allergies or rhinitis.    [provider]  Fluticasone-Salmeterol (ADVAIR) 100-50 MCG/DOSE AEPB Inhale 1 puff into the lungs 2 (two) times daily.    [provider]  glipiZIDE (GLUCOTROL) 10 MG tablet Take 10 mg by mouth 2 (two) times daily before a meal.    [provider]  hydrochlorothiazide (HYDRODIURIL) 25 MG tablet Take 25 mg by mouth daily as needed (swelling).    [provider]  LANTUS SOLOSTAR 100 UNIT/ML Solostar Pen Inject 50 Units into the skin at bedtime. 03/15/18   [provider]  methocarbamol (ROBAXIN) 500 MG tablet Take 1 tablet (500 mg total) by mouth 2 (two) times daily. 11/02/18   Wieters, Hallie C, PA-C  metoprolol tartrate (LOPRESSOR) 25 MG tablet TAKE 1 TABLET BY MOUTH TWICE DAILY 03/15/18   Marykay LexHarding, David W, MD  morphine (MSIR) 15 MG tablet Take 0.5 tablets (7.5 mg total) by mouth every 4 (four) hours as needed for severe  pain. 12/09/18   Melene PlanFloyd, Dan, DO  Multiple Vitamins-Calcium (MULTI-DAY/CALCIUM/EXTRA IRON) TABS Take 1 tablet by mouth daily. 05/31/18   [provider]  simvastatin (ZOCOR) 40 MG tablet Take 40 mg by mouth at bedtime. 02/01/18   [provider]  Spacer/Aero-Holding Chambers (AEROCHAMBER PLUS) inhaler Use as instructed 01/18/19   Domenick GongMortenson, Ashley, MD  tiZANidine (ZANAFLEX) 2 MG tablet Take 1 tablet (2 mg total) by mouth at bedtime. 03/24/19   Georgetta HaberBurky, Natalie B, NP  traMADol (ULTRAM) 50 MG tablet Take 1 tablet (50 mg total) by mouth every 12 (twelve) hours as needed. 11/02/18   Wieters, Junius CreamerHallie C, PA-C    Family History Family History  Problem Relation Age of Onset  .  Pancreatic cancer Mother   . CAD Mother        Open heart surgery 60s-70s  . Stroke Father   . Heart disease Sister        Unclear details    Social History Social History   Tobacco Use  . Smoking status: Never Smoker  . Smokeless tobacco: Never Used  Substance Use Topics  . Alcohol use: No  . Drug use: No     Allergies   Ace inhibitors, Lisinopril, Augmentin [amoxicillin-pot clavulanate], Mushroom extract complex, and Tetanus toxoids   Review of Systems Review of Systems  Constitutional: Positive for fatigue. Negative for fever.  HENT: Negative for congestion, sinus pressure and sore throat.   Eyes: Negative for photophobia, pain and visual disturbance.  Respiratory: Negative for cough and shortness of breath.   Cardiovascular: Negative for chest pain.  Gastrointestinal: Negative for abdominal pain, nausea and vomiting.  Genitourinary: Negative for decreased urine volume and hematuria.  Musculoskeletal: Negative for myalgias, neck pain and neck stiffness.  Neurological: Negative for dizziness, syncope, facial asymmetry, speech difficulty, weakness, light-headedness, numbness and headaches.     Physical Exam Triage Vital Signs ED Triage Vitals  Enc Vitals Group     BP 06/06/19 1320 (!)  142/94     Pulse Rate 06/06/19 1320 72     Resp 06/06/19 1320 18     Temp 06/06/19 1320 99 F (37.2 C)     Temp Source 06/06/19 1320 Oral     SpO2 06/06/19 1320 99 %     Weight 06/06/19 1318 190 lb (86.2 kg)     Height --      Head Circumference --      Peak Flow --      Pain Score 06/06/19 1318 0     Pain Loc --      Pain Edu? --      Excl. in [place]? --    No data found.  Updated Vital Signs BP (!) 142/94 (BP Location: Right Arm)   Pulse 72   Temp 99 F (37.2 C) (Oral)   Resp 18   Wt 190 lb (86.2 kg)   SpO2 99%   BMI 34.75 kg/m   Visual Acuity Right Eye Distance:   Left Eye Distance:   Bilateral Distance:    Right Eye Near:   Left Eye Near:    Bilateral Near:     Physical Exam Vitals signs and nursing note reviewed.  Constitutional:      General: She is not in acute distress.    Appearance: She is well-developed.  HENT:     Head: Normocephalic and atraumatic.     Comments: Face symmetric, no notable twitching during exam    Ears:     Comments: Bilateral ears without tenderness to palpation of external auricle, tragus and mastoid, EAC's without erythema or swelling, TM's with good bony landmarks and cone of light. Non erythematous.     Mouth/Throat:     Comments: Oral mucosa pink and moist, no tonsillar enlargement or exudate. Posterior pharynx patent and nonerythematous, no uvula deviation or swelling. Normal phonation. Palate elevating symmetrically Eyes:     Extraocular Movements: Extraocular movements intact.     Conjunctiva/sclera: Conjunctivae normal.     Pupils: Pupils are equal, round, and reactive to light.  Neck:     Musculoskeletal: Neck supple.     Comments: Full active range of motion of neck no nuchal rigidity Cardiovascular:     Rate and Rhythm: Normal rate  and regular rhythm.     Heart sounds: No murmur.     Comments: Carotid bruit auscultated on left side Pulmonary:     Effort: Pulmonary effort is normal. No respiratory distress.      Breath sounds: Normal breath sounds.     Comments: Breathing comfortably at rest, CTABL, no wheezing, rales or other adventitious sounds auscultated Abdominal:     Palpations: Abdomen is soft.     Tenderness: There is no abdominal tenderness.  Skin:    General: Skin is warm and dry.  Neurological:     General: No focal deficit present.     Mental Status: She is alert and oriented to person, place, and time. Mental status is at baseline.     Comments: Patient A&O x3, cranial nerves II-XII grossly intact, strength at shoulders, hips and knees 5/5, equal bilaterally, patellar reflex difficult to obtain bilaterally.  Gait without abnormality, stable.      UC Treatments / Results  Labs (all labs ordered are listed, but only abnormal results are displayed) Labs Reviewed  CBC  BASIC METABOLIC PANEL    EKG None  Radiology No results found.  Procedures Procedures (including critical care time)  Medications Ordered in UC Medications - No data to display  Initial Impression / Assessment and Plan / UC Course  I have reviewed the triage vital signs and the nursing notes.  Pertinent labs & imaging results that were available during my care of the patient were reviewed by me and considered in my medical decision making (see chart for details).     Patient with facial twitching off and on over the past couple weeks.  No clear cause.  She does have reported fatigue and slight sensation of feeling off balance.  No neuro deficits on exam, vital signs stable.  Will obtain CBC and BMP to check general labs.  Advised to continue to monitor facial twitching if becoming more prominent to follow-up with PCP, possible neurology.  Do not suspect trigeminal neuralgia at this time.  Do not suspect underlying stroke, but advised patient that if her sensation of feeling off balance or to have one-sided weakness to follow-up in the emergency room for further evaluation/imaging.  Continue to drink plenty of  fluids, hydrate, maintain normal diet.Discussed strict return precautions. Patient verbalized understanding and is agreeable with plan.  Final Clinical Impressions(s) / UC Diagnoses   Final diagnoses:  Fatigue, unspecified type  Facial twitching     Discharge Instructions     We will draw blood work to check her electrolytes as well as hemoglobin; I will call if any thing is abnormal Please be sure to drink plenty of fluids, maintain normal diet incorporating fruits and vegetables Please follow-up in the emergency room if you are developing any increased weakness, dizziness, lightheadedness, headache, vision changes, off-balance sensation   ED Prescriptions    None     Controlled Substance Prescriptions [place] Controlled Substance Registry consulted? Not Applicable   Lew DawesWieters, Hallie C, [place] JerseyPA-C 06/06/19 1402

## 2019-06-06 NOTE — Discharge Instructions (Signed)
We will draw blood work to check her electrolytes as well as hemoglobin; I will call if any thing is abnormal Please be sure to drink plenty of fluids, maintain normal diet incorporating fruits and vegetables Please follow-up in the emergency room if you are developing any increased weakness, dizziness, lightheadedness, headache, vision changes, off-balance sensation

## 2019-09-16 ENCOUNTER — Ambulatory Visit (HOSPITAL_COMMUNITY)
Admission: EM | Admit: 2019-09-16 | Discharge: 2019-09-16 | Disposition: A | Discharge status: Home / Self Care | Payer: Medicare HMO | Attending: Emergency Medicine | Admitting: Emergency Medicine

## 2019-09-16 ENCOUNTER — Other Ambulatory Visit: Payer: Self-pay

## 2019-09-16 ENCOUNTER — Encounter (HOSPITAL_COMMUNITY): Payer: Self-pay

## 2019-09-16 DIAGNOSIS — Z20828 Contact with and (suspected) exposure to other viral communicable diseases: Secondary | ICD-10-CM | POA: Insufficient documentation

## 2019-09-16 DIAGNOSIS — J0140 Acute pansinusitis, unspecified: Secondary | ICD-10-CM | POA: Diagnosis present

## 2019-09-16 MED ORDER — DOXYCYCLINE HYCLATE 100 MG PO CAPS: 100.0000 mg | ORAL_CAPSULE | Freq: Two times a day (BID) | ORAL | 0 refills | Status: AC

## 2019-09-16 NOTE — ED Provider Notes (Signed)
HPI  SUBJECTIVE:  Cheryl Gill is a 68 y.o. female who presents with 2 weeks of sinus pain pressure, clear nasal congestion, rhinorrhea, postnasal drip.  She states that her "allergies are acting up", and is taking Zyrtec without any improvement in her symptoms.  She states that she is "getting worse".  She has also been taking Tylenol, Flonase, steam.  Flonase and steam seem to help temporarily.  Symptoms are worse with bending forward.  No dental pain, fevers, facial swelling, cough, double sickening.  No body aches, sore throat, loss of sense of smell or taste, cough, shortness of breath, abdominal pain, nausea, vomiting, diarrhea.  No exposure to cover.  She reports bilateral ear pain, no change in her hearing, otorrhea.  She has a past medical history including diabetes, asthma, and STEMI, allergies, chronic kidney disease stage III, hypertension, hypercholesterolemia, frequent sinusitis.  She states this feels identical to previous episodes of sinus infections.  LKG:[id number], Christean Grief, MD  Past Medical History:  Diagnosis Date  . Asthma   . CAD (coronary artery disease)    a. 12/2014: NSTEMI with cath showing Ostial LAD ~95%, mLAD 80-90%; EF ~40-45%, LAD disease compromised major D1 branch. CABG recommended and performed with LIMA-LAD and SVG-D1.   [name] Kitchen CKD (chronic kidney disease), stage III   . Colitis   . Diabetes mellitus type 2, insulin dependent ([employer])   . Essential hypertension   . Hyperlipidemia with target LDL less than 70 01/28/2014  . Metabolic syndrome: DM, HTN, Obesity as well as HLD 12/18/2014  . NSTEMI (non-ST elevated myocardial infarction) ([place]) 12/18/2014   Echo 1/6: EF 60-65%, no Regional WMA, Gr 1 DD  . Obesity    BMI ~35-36  . 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 Seasonal allergies     Past Surgical History:  Procedure Laterality Date  . ABDOMINAL HYSTERECTOMY    . CORONARY ARTERY BYPASS GRAFT N/A 12/21/2014   Procedure: CORONARY ARTERY BYPASS GRAFTING (CABG)  TIMES TWO USING LEFT INTERNAL MAMMARY AND RIGHT SAPHENOUS LEG VEIN HARVESTED ENDOSCOPICALLY;  Surgeon: Melrose Nakayama, MD;  Location: [facility];  Service: Open Heart Surgery;  Laterality: N/A;  . LEFT HEART CATHETERIZATION WITH CORONARY ANGIOGRAM N/A 12/18/2014   Procedure: LEFT HEART CATHETERIZATION WITH CORONARY ANGIOGRAM;  Surgeon: Peter M Martinique, MD;  Location: [facility] C[facility] LAB;  Service: Cardiovascular;  Laterality: N/A;  . NM MYOCAR PERF WALL MOTION  04/09/2010   Abnormal study - appears to be a small area of apical infarction. Reversible ischemia is not seen.  . TEE WITHOUT CARDIOVERSION N/A 12/21/2014   Procedure: TRANSESOPHAGEAL ECHOCARDIOGRAM (TEE);  Surgeon: Melrose Nakayama, MD;  Location: [place];  Service: Open Heart Surgery;  Laterality: N/A;  . TONSILLECTOMY    . TRANSTHORACIC ECHOCARDIOGRAM  12/2014   LVEF 60-65%, normal wall thickness, normal wall motion, diastolic dysfunction, indeterminate LV filling pressure, normal LA size.    Family History  Problem Relation Age of Onset  . Pancreatic cancer Mother   . CAD Mother        Open heart surgery 60s-70s  . Stroke Father   . Heart disease Sister        Unclear details    Social History   Tobacco Use  . Smoking status: Never Smoker  . Smokeless tobacco: Never Used  Substance Use Topics  . Alcohol use: No  . Drug use: No    No current facility-administered medications for this encounter.   Current Outpatient Medications:  .  albuterol (PROVENTIL HFA;VENTOLIN HFA) 108 (90 Base) MCG/ACT inhaler, Inhale 2 puffs into the lungs every 4 (four) hours as needed. For shortness of breath or wheezing, Disp: 1 Inhaler, Rfl: 0 .  aspirin EC 81 MG tablet, Take 81 mg by mouth daily., Disp: , Rfl:  .  colchicine 0.6 MG tablet, Take 0.5 tablets (0.3 mg total) by mouth 2 (two) times daily., Disp: 30 tablet, Rfl: 3 .  cycloSPORINE (RESTASIS) 0.05 % ophthalmic emulsion, Place 1 drop into both eyes 2 (two) times daily., Disp: , Rfl:  .   diclofenac sodium (VOLTAREN) 1 % GEL, Apply 2 g topically 4 (four) times daily., Disp: 1 Tube, Rfl: 0 .  doxycycline (VIBRAMYCIN) 100 MG capsule, Take 1 capsule (100 mg total) by mouth 2 (two) times daily for 7 days., Disp: 14 capsule, Rfl: 0 .  fluticasone (FLONASE) 50 MCG/ACT nasal spray, Place 2 sprays into both nostrils as needed for allergies or rhinitis., Disp: , Rfl:  .  Fluticasone-Salmeterol (ADVAIR) 100-50 MCG/DOSE AEPB, Inhale 1 puff into the lungs 2 (two) times daily., Disp: , Rfl:  .  glipiZIDE (GLUCOTROL) 10 MG tablet, Take 10 mg by mouth 2 (two) times daily before a meal., Disp: , Rfl:  .  hydrochlorothiazide (HYDRODIURIL) 25 MG tablet, Take 25 mg by mouth daily as needed (swelling)., Disp: , Rfl:  .  LANTUS SOLOSTAR 100 UNIT/ML Solostar Pen, Inject 50 Units into the skin at bedtime., Disp: , Rfl: 5 .  metoprolol tartrate (LOPRESSOR) 25 MG tablet, TAKE 1 TABLET BY MOUTH TWICE DAILY, Disp: 180 tablet, Rfl: 1 .  morphine (MSIR) 15 MG tablet, Take 0.5 tablets (7.5 mg total) by mouth every 4 (four) hours as needed for severe pain., Disp: 4 tablet, Rfl: 0 .  Multiple Vitamins-Calcium (MULTI-DAY/CALCIUM/EXTRA IRON) TABS, Take 1 tablet by mouth daily., Disp: , Rfl:  .  simvastatin (ZOCOR) 40 MG tablet, Take 40 mg by mouth at bedtime., Disp: , Rfl:  .  Spacer/Aero-Holding Chambers (AEROCHAMBER PLUS) inhaler, Use as instructed, Disp: 1 each, Rfl: 2  Allergies  Allergen Reactions  . Ace Inhibitors Anaphylaxis  . Lisinopril Anaphylaxis  . Augmentin [Amoxicillin-Pot Clavulanate]     "diarrhea"  . Mushroom Extract Complex Swelling  . Tetanus Toxoids Nausea And Vomiting and Swelling     ROS  As noted in HPI.   Physical Exam  BP 130/76 (BP Location: Right Arm)   Pulse 74   Temp 98.8 F (37.1 C) (Oral)   Resp 18   Wt 86.2 kg   SpO2 100%   BMI 34.75 kg/m   Constitutional: Well developed, well nourished, no acute distress Eyes:  EOMI, conjunctiva normal bilaterally HENT:  Normocephalic, atraumatic,mucus membranes moist.  Bilateral TMs normal.  Erythematous, but not swollen turbinates.  Clear nasal congestion.  Exquisite maxillary and frontal sinus tenderness.  Positive postnasal drip. Respiratory: Normal inspiratory effort Cardiovascular: Normal rate GI: nondistended skin: No rash, skin intact Musculoskeletal: no deformities Neurologic: Alert & oriented x 3, no focal neuro deficits Psychiatric: Speech and behavior appropriate   ED Course   Medications - No data to display  Orders Placed This Encounter  Procedures  . Novel Coronavirus, NAA (Hosp order, Send-out to Ref Lab; TAT 18-24 hrs    Standing Status:   Standing    Number of Occurrences:   1    Order Specific Question:   Is this test for diagnosis or screening    Answer:   Diagnosis of ill patient    Order Specific Question:  Symptomatic for COVID-19 as defined by CDC    Answer:   No    Order Specific Question:   Hospitalized for COVID-19    Answer:   No    Order Specific Question:   Admitted to ICU for COVID-19    Answer:   No    Order Specific Question:   Previously tested for COVID-19    Answer:   No    Order Specific Question:   Resident in a congregate (group) care setting    Answer:   No    Order Specific Question:   Employed in healthcare setting    Answer:   No    Order Specific Question:   Pregnant    Answer:   No    No results found for this or any previous visit (from the past 24 hour(s)). No results found.  ED Clinical Impression  1. Acute non-recurrent pansinusitis      ED Assessment/Plan  Sending off COVID test.  2-day work note written.  Presentation consistent with a sinus infection.  Given duration of symptoms, feel that antibiotic therapy is warranted.  Home with doxycycline, saline nasal irrigation with a Lloyd Huger med rinse as often as she wants, discontinue the Zyrtec, start Mucinex.  If the Mucinex is not helping then she can try Claritin or Allegra.  Follow-up  with PMD as needed.  Discussed MDM, treatment plan, and plan for follow-up with patient. . patient agrees with plan.   Meds ordered this encounter  Medications  . doxycycline (VIBRAMYCIN) 100 MG capsule    Sig: Take 1 capsule (100 mg total) by mouth 2 (two) times daily for 7 days.    Dispense:  14 capsule    Refill:  0    *This clinic note was created using Scientist, clinical (histocompatibility and immunogenetics). Therefore, there may be occasional mistakes despite careful proofreading.   ?    Domenick Gong, MD 09/17/19 1159

## 2019-09-16 NOTE — Discharge Instructions (Addendum)
Stop Zyrtec.  Start plain Mucinex.  If this does not help, then stop the Mucinex and start Claritin or Allegra.  Return to the ER if you get worse, have a fever >100.4, or for any concerns. You may take 1 gram of tylenol up to 3-4 times a day as needed for pain. This is an effective combination for pain.  Use a NeilMed sinus rinse as often as you want to to reduce nasal congestion. Follow the directions on the box.  This will also help reduce your allergies.  Go to www.goodrx.com to look up your medications. This will give you a list of where you can find your prescriptions at the most affordable prices. Or you can ask the pharmacist what the cash price is. This is frequently cheaper than going through insurance.

## 2019-09-16 NOTE — ED Triage Notes (Signed)
Pt states she has a sinus infection. Pt states she has sinus pressure and drainage and facal pain x 2 weeks.

## 2019-09-17 LAB — NOVEL CORONAVIRUS, NAA (HOSP ORDER, SEND-OUT TO REF LAB; TAT 18-24 HRS): SARS-CoV-2, NAA: NOT DETECTED

## 2019-10-03 ENCOUNTER — Other Ambulatory Visit: Payer: Self-pay

## 2019-10-03 ENCOUNTER — Ambulatory Visit
Admission: RE | Admit: 2019-10-03 | Discharge: 2019-10-03 | Disposition: A | Discharge status: Home / Self Care | Payer: Medicare HMO | Source: Ambulatory Visit | Attending: Internal Medicine | Admitting: Internal Medicine

## 2019-10-03 DIAGNOSIS — Z1231 Encounter for screening mammogram for malignant neoplasm of breast: Secondary | ICD-10-CM

## 2019-10-15 HISTORY — PX: NM MYOVIEW LTD: HXRAD82

## 2019-10-17 ENCOUNTER — Encounter: Payer: Self-pay | Admitting: Cardiology

## 2019-10-17 ENCOUNTER — Ambulatory Visit (INDEPENDENT_AMBULATORY_CARE_PROVIDER_SITE_OTHER): Payer: Medicare HMO | Admitting: Cardiology

## 2019-10-17 ENCOUNTER — Other Ambulatory Visit: Payer: Self-pay

## 2019-10-17 VITALS — BP 124/80 | HR 67 | Temp 97.0°F | Ht 62.0 in | Wt 194.0 lb

## 2019-10-17 DIAGNOSIS — Z951 Presence of aortocoronary bypass graft: Secondary | ICD-10-CM

## 2019-10-17 DIAGNOSIS — I25119 Atherosclerotic heart disease of native coronary artery with unspecified angina pectoris: Secondary | ICD-10-CM | POA: Diagnosis not present

## 2019-10-17 DIAGNOSIS — I214 Non-ST elevation (NSTEMI) myocardial infarction: Secondary | ICD-10-CM | POA: Diagnosis not present

## 2019-10-17 DIAGNOSIS — E8881 Metabolic syndrome: Secondary | ICD-10-CM | POA: Diagnosis not present

## 2019-10-17 DIAGNOSIS — E669 Obesity, unspecified: Secondary | ICD-10-CM

## 2019-10-17 DIAGNOSIS — E785 Hyperlipidemia, unspecified: Secondary | ICD-10-CM

## 2019-10-17 NOTE — Progress Notes (Signed)
PCP: Fleet Contras, MD  Clinic Note: Chief Complaint  Patient presents with  . Follow-up    6 months.    HPI:    Cheryl Gill is a 68 y.o. female with a PMH notable for CAD-CABG January 2016 (NSTEMI - LIMA-LAD, SVG-D1) along with hypertension, hyperlipidemia and DM-2 with stage III CKD below who presents today for delayed annual follow-up  January 2016: Admitted for Non-STEMI ? cardiac catheterization revealed severe disease in the proximal LAD that compromised the major diagonal branch. Based on the location of the stenosis and extensive disease -->CABG.  ? CABGx2: LIMA to LAD and SVG-D 1.  ? Well preserved EF by echo at the time of grade 1 diastolic function. [name] was last seen in July 2019.  Was not doing much exercise due to her arthritis and gout pains.  No chest pain or pressure. --> Recommended joining Silver sneakers to start exercising. --> Plan was to consider Vascepa  Recent Hospitalizations:   Urgent care visit June 06, 2019: CC-fatigue.  Also noted facial twitching. -->  Diagnosed with zoster and trigeminal neuralgia.  Most, ER-urgent care October 3: Allergies acting up, noted congestion rhinorrhea postnasal drip.  Symptoms similar to sinus infection.--Treated with doxycycline nasal spray etc.   Reviewed  CV studies:    The following studies were reviewed today: (if available, images/films reviewed: From Epic Chart or Care Everywhere) . None:   Interval History:   Cheryl Gill returns here today fairly well.  She notes that she only feels palpitations when she eats chocolate.  Her biggest issue is that she is having allergy issues and wheezing coughing.    Cardiovascular review of symptoms: Cardiovascular ROS: no chest pain or dyspnea on exertion positive for - palpitations and Rapid heartbeats when she eats chocolate mostly. negative for - edema or loss of consciousness  The patient does not have symptoms  concerning for COVID-19 infection (fever, chills, cough, or new shortness of breath).  The patient is practicing social distancing. ++ Masking.  ++ Groceries/shopping.    REVIEWED OF SYSTEMS   ROS: A comprehensive was performed. Review of Systems  Constitutional: Positive for malaise/fatigue (Mildly reduced energy).  HENT: Negative for nosebleeds.   Respiratory: Negative for shortness of breath (Stable).   Cardiovascular: Positive for leg swelling.  Gastrointestinal: Negative for blood in stool and constipation.  Genitourinary: Negative for dysuria, frequency, hematuria and urgency.  Musculoskeletal: Positive for myalgias. Negative for joint pain.  Neurological: Negative for loss of consciousness.  Psychiatric/Behavioral: Negative for depression and memory loss. The patient is not nervous/anxious and does not have insomnia.      I have reviewed and (if needed) personally updated the patient's problem list, medications, allergies, past medical and surgical history, social and family history.   PAST MEDICAL HISTORY   Past Medical History:  Diagnosis Date  . Asthma   . CAD (coronary artery disease)    a. 12/2014: NSTEMI with cath showing Ostial LAD ~95%, mLAD 80-90%; EF ~40-45%, LAD disease compromised major D1 branch. CABG recommended and performed with LIMA-LAD and SVG-D1.   [name] Kitchen CKD (chronic kidney disease), stage III   . Colitis   . Diabetes mellitus type 2, insulin dependent (HCC)   . Essential hypertension   . Hyperlipidemia with target LDL less than 70 01/28/2014  . Metabolic syndrome: DM, HTN, Obesity as well as HLD 12/18/2014  . NSTEMI (non-ST elevated myocardial infarction) (HCC) 12/18/2014   Echo 1/6: EF 60-65%, no Regional WMA,  Gr 1 DD  . Obesity    BMI ~35-36  . 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 Seasonal allergies     PAST SURGICAL HISTORY   Past Surgical History:  Procedure Laterality Date  . ABDOMINAL HYSTERECTOMY    . CORONARY ARTERY BYPASS GRAFT N/A  12/21/2014   Procedure: CORONARY ARTERY BYPASS GRAFTING (CABG) TIMES TWO USING LEFT INTERNAL MAMMARY AND RIGHT SAPHENOUS LEG VEIN HARVESTED ENDOSCOPICALLY;  Surgeon: Loreli Slot, MD;  Location: [facility] OR;  Service: Open Heart Surgery;  Laterality: N/A;  . LEFT HEART CATHETERIZATION WITH CORONARY ANGIOGRAM N/A 12/18/2014   Procedure: LEFT HEART CATHETERIZATION WITH CORONARY ANGIOGRAM;  Surgeon: Peter M Swaziland, MD;  Location: [facility] CATH LAB;  Service: Cardiovascular;  Laterality: N/A;  . NM MYOCAR PERF WALL MOTION  04/09/2010   Abnormal study - appears to be a small area of apical infarction. Reversible ischemia is not seen.  . TEE WITHOUT CARDIOVERSION N/A 12/21/2014   Procedure: TRANSESOPHAGEAL ECHOCARDIOGRAM (TEE);  Surgeon: Loreli Slot, MD;  Location: [facility] OR;  Service: Open Heart Surgery;  Laterality: N/A;  . TONSILLECTOMY    . TRANSTHORACIC ECHOCARDIOGRAM  12/2014   LVEF 60-65%, normal wall thickness, normal wall motion, diastolic dysfunction, indeterminate LV filling pressure, normal LA size.    MEDICATIONS/ALLERGIES   Current Meds  Medication Sig  . albuterol (PROVENTIL HFA;VENTOLIN HFA) 108 (90 Base) MCG/ACT inhaler Inhale 2 puffs into the lungs every 4 (four) hours as needed. For shortness of breath or wheezing  . aspirin EC 81 MG tablet Take 81 mg by mouth daily.  . cetirizine (ZYRTEC) 10 MG tablet Take 10 mg by mouth daily.  . colchicine 0.6 MG tablet Take 0.5 tablets (0.3 mg total) by mouth 2 (two) times daily.  . cycloSPORINE (RESTASIS) 0.05 % ophthalmic emulsion Place 1 drop into both eyes 2 (two) times daily.  . diclofenac sodium (VOLTAREN) 1 % GEL Apply 2 g topically 4 (four) times daily.  . fluticasone (FLONASE) 50 MCG/ACT nasal spray Place 2 sprays into both nostrils as needed for allergies or rhinitis.  . Fluticasone-Salmeterol (ADVAIR) 100-50 MCG/DOSE AEPB Inhale 1 puff into the lungs 2 (two) times daily.  [name] Kitchen glipiZIDE (GLUCOTROL) 10 MG tablet Take 10 mg by mouth 2 (two)  times daily before a meal.  . hydrochlorothiazide (HYDRODIURIL) 25 MG tablet Take 25 mg by mouth daily as needed (swelling).  [name] Kitchen LANTUS SOLOSTAR 100 UNIT/ML Solostar Pen Inject 50 Units into the skin at bedtime.  . metoprolol tartrate (LOPRESSOR) 25 MG tablet TAKE 1 TABLET BY MOUTH TWICE DAILY  . morphine (MSIR) 15 MG tablet Take 0.5 tablets (7.5 mg total) by mouth every 4 (four) hours as needed for severe pain.  . Multiple Vitamins-Calcium (MULTI-DAY/CALCIUM/EXTRA IRON) TABS Take 1 tablet by mouth daily.  . simvastatin (ZOCOR) 40 MG tablet Take 40 mg by mouth at bedtime.  [name] Kitchen Spacer/Aero-Holding Chambers (AEROCHAMBER PLUS) inhaler Use as instructed    Allergies  Allergen Reactions  . Ace Inhibitors Anaphylaxis  . Lisinopril Anaphylaxis  . Augmentin [Amoxicillin-Pot Clavulanate]     "diarrhea"  . Mushroom Extract Complex Swelling  . Tetanus Toxoids Nausea And Vomiting and Swelling    SOCIAL HISTORY/FAMILY HISTORY   Social History   Tobacco Use  . Smoking status: Never Smoker  . Smokeless tobacco: Never Used  Substance Use Topics  . Alcohol use: No  . Drug use: No   Social History   Social History Narrative  . Not on file  family history includes CAD in her mother; Heart disease in her sister; Pancreatic cancer in her mother; Stroke in her father.   OBJCTIVE -PE, EKG, labs   Wt Readings from Last 3 Encounters:  10/17/19 194 lb (88 kg)  09/16/19 190 lb (86.2 kg)  06/06/19 190 lb (86.2 kg)    Physical Exam: BP 124/80 (BP Location: Left Arm, Patient Position: Sitting, Cuff Size: Normal)   Pulse 67   Temp (!) 97 F (36.1 C)   Ht  (1.575 m)   Wt 194 lb (88 kg)   BMI 35.48 kg/m  Physical Exam  Constitutional: She is oriented to person, place, and time. She appears well-developed and well-nourished. No distress.  Healthy-appearing.  Well-groomed  HENT:  Head: Normocephalic and atraumatic.  Eyes: EOM are normal.  Neck: Normal range of motion. No hepatojugular  reflux present. Carotid bruit is not present.  Cardiovascular: Normal rate, regular rhythm, normal heart sounds and intact distal pulses.  No extrasystoles are present. PMI is not displaced. Exam reveals no gallop and no friction rub.  No murmur heard. Pulmonary/Chest: Breath sounds normal. No respiratory distress. She has no wheezes. She has no rales.  Abdominal: Soft. Bowel sounds are normal. She exhibits no distension. There is no abdominal tenderness. There is no rebound.  Obese  Musculoskeletal: Normal range of motion.        General: No edema.  Neurological: She is alert and oriented to person, place, and time. No cranial nerve deficit.  Psychiatric: She has a normal mood and affect. Her behavior is normal. Judgment and thought content normal.  Vitals reviewed.   Adult ECG Report  Rate: 67 ;  Rhythm: normal sinus rhythm and LAA, cannot exclude anterior MI, age undetermined.;   Narrative Interpretation: Stable EKG.  Recent Labs:   Lab Results  Component Value Date   CHOL 123 07/01/2018   HDL 32 (L) 07/01/2018   LDLCALC 66 07/01/2018   TRIG 123 07/01/2018   CHOLHDL 3.8 07/01/2018   Lab Results  Component Value Date   CREATININE 1.66 (H) 06/06/2019   BUN 30 (H) 06/06/2019   NA 140 06/06/2019   K 4.5 06/06/2019   CL 106 06/06/2019   CO2 23 06/06/2019     ASSESSMENT/PLAN   Problem List Items Addressed This Visit    NSTEMI (non-ST elevated myocardial infarction) (HCC) (Chronic)    No regional wall motion normalities noted on echo.  Is now status post CABG x2 to the LAD system.  Did not have significant lesions in the right or circumflex system.      Relevant Orders   Comprehensive metabolic panel   MYOCARDIAL PERFUSION IMAGING   Metabolic syndrome: DM, HTN, Obesity as well as HLD (Chronic)    Components:  Hypertension, on metoprolol, HCTZ.  Obesity-BMI still 35.  Lipids: Triglycerides actually relatively normal.  Were elevated last visit and be considered for  Septra.  DM-2, A1c 7.7.  Managed by PCP.      Relevant Orders   Lipid panel   Comprehensive metabolic panel   MYOCARDIAL PERFUSION IMAGING   Coronary artery disease involving native coronary artery without angina pectoris - Primary (Chronic)    No active angina symptoms.  Not aware if he has had any stress tests or echo was done recently.  Will need to scour the chart to see if any of these show up.  Is on HCTZ and metoprolol. He is on simvastatin. Is on aspirin but not other antiplatelet agent.   It  has been almost 5 years since her last ischemic evaluation.  Would be due for routine surveillance Myoview stress test. Plan: Lexiscan Myoview.      S/P CABG x 2 (Chronic)    No ischemic evaluation for several years.  Thankfully, not really having symptoms but she had atypical symptoms to begin with.  Due for follow-up Myoview stress test. Plan: Lexiscan Myoview.      Relevant Orders   EKG 12-Lead   Comprehensive metabolic panel   MYOCARDIAL PERFUSION IMAGING   Hyperlipidemia with target LDL less than 70 (Chronic)    Last labs checked were in July 2019.  LDL was 66.  Due for labs to be checked soon.  Continue simvastatin at current dose.  Lipid panel ordered along with chemistry panel.      Relevant Orders   EKG 12-Lead   Lipid panel   Comprehensive metabolic panel   Obesity, Class I, BMI 30-34.9 (Chronic)    The patient understands the need to lose weight with diet and exercise. We have discussed specific strategies for this.  She is hoping that her [employer] gym will open up so she can get back to doing exercise.          COVID-19 Education: The signs and symptoms of COVID-19 were discussed with the patient and how to seek care for testing (follow up with PCP or arrange E-visit).   The importance of social distancing was discussed today.  I spent a total of 22 minutes with the patient and chart review. >  50% of the time was spent in direct patient consultation.   Additional time spent with chart review (studies, outside notes, etc): 4 Total Time: 26 min   Current medicines are reviewed at length with the patient today.  (+/- concerns) none   Patient Instructions / Medication Changes & Studies & Tests Ordered   Patient Instructions  Medication Instructions:  NO CHANGES  *If you need a refill on your cardiac medications before your next appointment, please call your pharmacy*  Lab Work: LIPID  CMP If you have labs (blood work) drawn today and your tests are completely normal, you will receive your results only by: [name] Kitchen MyChart Message (if you have MyChart) OR . A paper copy in the mail If you have any lab test that is abnormal or we need to change your treatment, we will call you to review the results.  Testing/Procedures: WILL BE SCHEDULE AT [place] [address] Your physician has requested that you have a lexiscan myoview. For further information please visit [email or web address]. Please follow instruction sheet, as given.    Follow-Up: At [facility], you and your health needs are our priority.  As part of our continuing mission to provide you with exceptional heart care, we have created designated Provider Care Teams.  These Care Teams include your primary Cardiologist (physician) and Advanced Practice Providers (APPs -  Physician Assistants and Nurse Practitioners) who all work together to provide you with the care you need, when you need it.  Your next appointment:   12 months  The format for your next appointment:   In Person  Provider:   Glenetta Hew, MD  Other Instructions    Studies Ordered:   Orders Placed This Encounter  Procedures  . Lipid panel  . Comprehensive metabolic panel  . MYOCARDIAL PERFUSION IMAGING  . EKG 12-Lead     Glenetta Hew, M.D., M.S. Interventional Cardiologist   Pager # [phone] Phone # [phone] [address]  [address] [place], [place] [address]   Thank you for  choosing [facility] at [facility]!!

## 2019-10-17 NOTE — Patient Instructions (Signed)
Medication Instructions:  NO CHANGES  *If you need a refill on your cardiac medications before your next appointment, please call your pharmacy*  Lab Work: LIPID  CMP If you have labs (blood work) drawn today and your tests are completely normal, you will receive your results only by: [name] Kitchen MyChart Message (if you have MyChart) OR . A paper copy in the mail If you have any lab test that is abnormal or we need to change your treatment, we will call you to review the results.  Testing/Procedures: WILL BE SCHEDULE AT [place] [address] Your physician has requested that you have a lexiscan myoview. For further information please visit [email or web address]. Please follow instruction sheet, as given.    Follow-Up: At [facility], you and your health needs are our priority.  As part of our continuing mission to provide you with exceptional heart care, we have created designated Provider Care Teams.  These Care Teams include your primary Cardiologist (physician) and Advanced Practice Providers (APPs -  Physician Assistants and Nurse Practitioners) who all work together to provide you with the care you need, when you need it.  Your next appointment:   12 months  The format for your next appointment:   In Person  Provider:   Glenetta Hew, MD  Other Instructions

## 2019-10-20 ENCOUNTER — Telehealth (HOSPITAL_COMMUNITY): Payer: Self-pay

## 2019-10-20 NOTE — Assessment & Plan Note (Addendum)
Last labs checked were in July 2019.  LDL was 66.  Due for labs to be checked soon.  Continue simvastatin at current dose.  Lipid panel ordered along with chemistry panel.

## 2019-10-20 NOTE — Assessment & Plan Note (Signed)
Components:  Hypertension, on metoprolol, HCTZ.  Obesity-BMI still 35.  Lipids: Triglycerides actually relatively normal.  Were elevated last visit and be considered for Septra.  DM-2, A1c 7.7.  Managed by PCP.

## 2019-10-20 NOTE — Telephone Encounter (Signed)
Encounter complete. 

## 2019-10-20 NOTE — Assessment & Plan Note (Addendum)
No active angina symptoms.  Not aware if he has had any stress tests or echo was done recently.  Will need to scour the chart to see if any of these show up.  Is on HCTZ and metoprolol. He is on simvastatin. Is on aspirin but not other antiplatelet agent.   It has been almost 5 years since her last ischemic evaluation.  Would be due for routine surveillance Myoview stress test. Plan: Lexiscan Myoview.

## 2019-10-20 NOTE — Assessment & Plan Note (Signed)
No regional wall motion normalities noted on echo.  Is now status post CABG x2 to the LAD system.  Did not have significant lesions in the right or circumflex system.

## 2019-10-20 NOTE — Assessment & Plan Note (Signed)
The patient understands the need to lose weight with diet and exercise. We have discussed specific strategies for this.  She is hoping that her [employer] gym will open up so she can get back to doing exercise.

## 2019-10-20 NOTE — Assessment & Plan Note (Signed)
No ischemic evaluation for several years.  Thankfully, not really having symptoms but she had atypical symptoms to begin with.  Due for follow-up Myoview stress test. Plan: Lexiscan Myoview.

## 2019-10-24 ENCOUNTER — Telehealth (HOSPITAL_COMMUNITY): Payer: Self-pay

## 2019-10-24 NOTE — Telephone Encounter (Signed)
Encounter complete. 

## 2019-10-25 ENCOUNTER — Other Ambulatory Visit: Payer: Self-pay

## 2019-10-25 ENCOUNTER — Ambulatory Visit (HOSPITAL_COMMUNITY)
Admission: RE | Admit: 2019-10-25 | Discharge: 2019-10-25 | Disposition: A | Discharge status: Home / Self Care | Payer: Medicare HMO | Source: Ambulatory Visit | Attending: Cardiovascular Disease | Admitting: Cardiovascular Disease

## 2019-10-25 DIAGNOSIS — I214 Non-ST elevation (NSTEMI) myocardial infarction: Secondary | ICD-10-CM | POA: Diagnosis present

## 2019-10-25 DIAGNOSIS — I25119 Atherosclerotic heart disease of native coronary artery with unspecified angina pectoris: Secondary | ICD-10-CM | POA: Diagnosis present

## 2019-10-25 DIAGNOSIS — E8881 Metabolic syndrome: Secondary | ICD-10-CM | POA: Diagnosis present

## 2019-10-25 DIAGNOSIS — Z951 Presence of aortocoronary bypass graft: Secondary | ICD-10-CM | POA: Diagnosis present

## 2019-10-25 LAB — LIPID PANEL
Chol/HDL Ratio: 3.2 ratio (ref 0.0–4.4)
Cholesterol, Total: 124 mg/dL (ref 100–199)
HDL: 39 mg/dL — ABNORMAL LOW (ref >39)
LDL Chol Calc (NIH): 67 mg/dL (ref 0–99)
Triglycerides: 91 mg/dL (ref 0–149)
VLDL Cholesterol Cal: 18 mg/dL (ref 5–40)

## 2019-10-25 LAB — COMPREHENSIVE METABOLIC PANEL
ALT: 20 IU/L (ref 0–32)
AST: 18 IU/L (ref 0–40)
Albumin/Globulin Ratio: 1.4 (ref 1.2–2.2)
Albumin: 3.8 g/dL (ref 3.8–4.8)
Alkaline Phosphatase: 111 IU/L (ref 39–117)
BUN/Creatinine Ratio: 14 (ref 12–28)
BUN: 19 mg/dL (ref 8–27)
Bilirubin Total: <0.2 mg/dL (ref 0.0–1.2)
CO2: 20 mmol/L (ref 20–29)
Calcium: 9 mg/dL (ref 8.7–10.3)
Chloride: 109 mmol/L — ABNORMAL HIGH (ref 96–106)
Creatinine, Ser: 1.4 mg/dL — ABNORMAL HIGH (ref 0.57–1.00)
GFR calc Af Amer: 44 mL/min/{1.73_m2} — ABNORMAL LOW (ref >59)
GFR calc non Af Amer: 38 mL/min/{1.73_m2} — ABNORMAL LOW (ref >59)
Globulin, Total: 2.8 g/dL (ref 1.5–4.5)
Glucose: 142 mg/dL — ABNORMAL HIGH (ref 65–99)
Potassium: 4 mmol/L (ref 3.5–5.2)
Sodium: 143 mmol/L (ref 134–144)
Total Protein: 6.6 g/dL (ref 6.0–8.5)

## 2019-10-25 LAB — MYOCARDIAL PERFUSION IMAGING
LV dias vol: 55 mL (ref 46–106)
LV sys vol: 19 mL
Peak HR: 92 {beats}/min
Rest HR: 57 {beats}/min
SDS: 0
SRS: 4
SSS: 4
TID: 1.1

## 2019-10-25 MED ORDER — TECHNETIUM TC 99M TETROFOSMIN IV KIT
29.8000 | PACK | Freq: Once | INTRAVENOUS | Status: AC | PRN
  Administered 2019-10-25: 29.8 via INTRAVENOUS
  Filled 2019-10-25: qty 30

## 2019-10-25 MED ORDER — TECHNETIUM TC 99M TETROFOSMIN IV KIT
10.5000 | PACK | Freq: Once | INTRAVENOUS | Status: AC | PRN
  Administered 2019-10-25: 10.5 via INTRAVENOUS
  Filled 2019-10-25: qty 11

## 2019-10-25 MED ORDER — REGADENOSON 0.4 MG/5ML IV SOLN
0.4000 mg | Freq: Once | INTRAVENOUS | Status: AC
  Administered 2019-10-25: 0.4 mg via INTRAVENOUS

## 2019-10-27 ENCOUNTER — Telehealth: Payer: Self-pay | Admitting: *Deleted

## 2019-10-27 ENCOUNTER — Encounter: Payer: Self-pay | Admitting: *Deleted

## 2019-10-27 NOTE — Telephone Encounter (Signed)
Left message to call back about results- myoview

## 2019-10-27 NOTE — Telephone Encounter (Signed)
-----   Message from Leonie Man, MD sent at 10/25/2019  7:20 PM EST ----- Doristine Devoid news.  Myoview stress test is low risk.  No significant blockages noted.  Continue medical management.   Glenetta Hew, MD

## 2019-10-27 NOTE — Telephone Encounter (Signed)
-----   Message from Leonie Man, MD sent at 10/25/2019  7:20 PM EST ----- Cholesterol panel looks good.  Total cholesterol stable, HDL has improved and LDL is stable.  Pretty much at goal.  Continue current regimen.  Glenetta Hew, MD

## 2019-12-27 ENCOUNTER — Other Ambulatory Visit: Payer: Self-pay | Admitting: Internal Medicine

## 2019-12-27 DIAGNOSIS — E2839 Other primary ovarian failure: Secondary | ICD-10-CM

## 2020-01-20 ENCOUNTER — Ambulatory Visit (HOSPITAL_COMMUNITY)
Admission: EM | Admit: 2020-01-20 | Discharge: 2020-01-20 | Disposition: A | Discharge status: Home / Self Care | Payer: Medicare HMO | Attending: Family Medicine | Admitting: Family Medicine

## 2020-01-20 ENCOUNTER — Encounter (HOSPITAL_COMMUNITY): Payer: Self-pay | Admitting: *Deleted

## 2020-01-20 ENCOUNTER — Other Ambulatory Visit: Payer: Self-pay

## 2020-01-20 DIAGNOSIS — Z951 Presence of aortocoronary bypass graft: Secondary | ICD-10-CM | POA: Diagnosis not present

## 2020-01-20 DIAGNOSIS — I129 Hypertensive chronic kidney disease with stage 1 through stage 4 chronic kidney disease, or unspecified chronic kidney disease: Secondary | ICD-10-CM | POA: Insufficient documentation

## 2020-01-20 DIAGNOSIS — N183 Chronic kidney disease, stage 3 unspecified: Secondary | ICD-10-CM | POA: Insufficient documentation

## 2020-01-20 DIAGNOSIS — Z823 Family history of stroke: Secondary | ICD-10-CM | POA: Insufficient documentation

## 2020-01-20 DIAGNOSIS — Z888 Allergy status to other drugs, medicaments and biological substances status: Secondary | ICD-10-CM | POA: Insufficient documentation

## 2020-01-20 DIAGNOSIS — Z794 Long term (current) use of insulin: Secondary | ICD-10-CM | POA: Insufficient documentation

## 2020-01-20 DIAGNOSIS — Z8249 Family history of ischemic heart disease and other diseases of the circulatory system: Secondary | ICD-10-CM | POA: Insufficient documentation

## 2020-01-20 DIAGNOSIS — Z800 Family history of malignant neoplasm of digestive organs: Secondary | ICD-10-CM | POA: Diagnosis not present

## 2020-01-20 DIAGNOSIS — Z20822 Contact with and (suspected) exposure to covid-19: Secondary | ICD-10-CM | POA: Diagnosis not present

## 2020-01-20 DIAGNOSIS — J0141 Acute recurrent pansinusitis: Secondary | ICD-10-CM | POA: Insufficient documentation

## 2020-01-20 DIAGNOSIS — I252 Old myocardial infarction: Secondary | ICD-10-CM | POA: Diagnosis not present

## 2020-01-20 DIAGNOSIS — J45909 Unspecified asthma, uncomplicated: Secondary | ICD-10-CM | POA: Diagnosis not present

## 2020-01-20 DIAGNOSIS — Z880 Allergy status to penicillin: Secondary | ICD-10-CM | POA: Insufficient documentation

## 2020-01-20 DIAGNOSIS — Z79899 Other long term (current) drug therapy: Secondary | ICD-10-CM | POA: Insufficient documentation

## 2020-01-20 DIAGNOSIS — E1122 Type 2 diabetes mellitus with diabetic chronic kidney disease: Secondary | ICD-10-CM | POA: Insufficient documentation

## 2020-01-20 DIAGNOSIS — I2510 Atherosclerotic heart disease of native coronary artery without angina pectoris: Secondary | ICD-10-CM | POA: Insufficient documentation

## 2020-01-20 DIAGNOSIS — Z881 Allergy status to other antibiotic agents status: Secondary | ICD-10-CM | POA: Insufficient documentation

## 2020-01-20 DIAGNOSIS — M109 Gout, unspecified: Secondary | ICD-10-CM | POA: Insufficient documentation

## 2020-01-20 DIAGNOSIS — Z887 Allergy status to serum and vaccine status: Secondary | ICD-10-CM | POA: Diagnosis not present

## 2020-01-20 DIAGNOSIS — Z7982 Long term (current) use of aspirin: Secondary | ICD-10-CM | POA: Diagnosis not present

## 2020-01-20 MED ORDER — DOXYCYCLINE HYCLATE 100 MG PO CAPS: 100.0000 mg | ORAL_CAPSULE | Freq: Two times a day (BID) | ORAL | 0 refills | Status: DC

## 2020-01-20 MED ORDER — IPRATROPIUM BROMIDE 0.03 % NA SOLN: 2.0000 | Freq: Two times a day (BID) | NASAL | 0 refills | Status: DC

## 2020-01-20 NOTE — ED Provider Notes (Signed)
[place]    CSN: [id number] Arrival date & time: 01/20/20  1752      History   Chief Complaint Chief Complaint  Patient presents with  . Nasal Congestion  . Cough    HPI Cheryl Gill is a 69 y.o. female.   HPI  Presents with a complaint of 3 weeks of nasal congestion, facial pressure, itchy throat, occasional wheezing, and non-productive cough. Patient's medication history significant for chronic asthma.  No known COVID-19 exposures. 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 He has only taken tylenol for symptoms. Reports stopped using Advair per her providers recommendations. She has a rescue inhaler, however when asked reports not using when wheezing occurs. Endorses wheezing is not daily only occasionally. Occasional cough which is non-productive. Denies GI symptoms, chest pain or shortness of breath.  Past Medical History:  Diagnosis Date  . Asthma   . CAD (coronary artery disease)    a. 12/2014: NSTEMI with cath showing Ostial LAD ~95%, mLAD 80-90%; EF ~40-45%, LAD disease compromised major D1 branch. CABG recommended and performed with LIMA-LAD and SVG-D1.   [name] Kitchen CKD (chronic kidney disease), stage III   . Colitis   . Diabetes mellitus type 2, insulin dependent ([place])   . Essential hypertension   . Hyperlipidemia with target LDL less than 70 01/28/2014  . Metabolic syndrome: DM, HTN, Obesity as well as HLD 12/18/2014  . NSTEMI (non-ST elevated myocardial infarction) ([place]) 12/18/2014   Echo 1/6: EF 60-65%, no Regional WMA, Gr 1 DD  . Obesity    BMI ~35-36  . 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 Seasonal allergies     Patient Active Problem List   Diagnosis Date Noted  . Acute gout 06/19/2018  . Left leg pain 06/18/2018  . CKD (chronic kidney disease), stage III 06/18/2018  . Joint pain 06/18/2018  . Effusion of left knee   . Colitis, acute 03/05/2015  .  Colitis 03/05/2015  . Lower abdominal pain   . 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 Coronary artery disease involving native coronary artery without angina pectoris 12/19/2014  . NSTEMI (non-ST elevated myocardial infarction) ([place]) 12/18/2014  . Obesity, Class I, BMI 30-34.9 12/18/2014  . Metabolic syndrome: DM, HTN, Obesity as well as HLD 12/18/2014  . Frequency of urination 07/17/2014  . Nocturia more than twice per night 07/17/2014  . Abdominal pain 01/28/2014  . Asthma, chronic 01/28/2014  . Hyperlipidemia with target LDL less than 70 01/28/2014  . Leukocytosis 01/28/2014  . Normocytic anemia 01/18/2013  . 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 Diabetes mellitus type 2, uncontrolled ([place]) 01/16/2013  . Seasonal allergies     Past Surgical History:  Procedure Laterality Date  . ABDOMINAL HYSTERECTOMY    . CARDIAC SURGERY    . CORONARY ARTERY BYPASS GRAFT N/A 12/21/2014   Procedure: CORONARY ARTERY BYPASS GRAFTING (CABG) TIMES TWO USING LEFT INTERNAL MAMMARY AND RIGHT SAPHENOUS LEG VEIN HARVESTED ENDOSCOPICALLY;  Surgeon: Melrose Nakayama, MD;  Location: [place];  Service: Open Heart Surgery;  Laterality: N/A;  . LEFT HEART CATHETERIZATION WITH CORONARY ANGIOGRAM N/A 12/18/2014   Procedure: LEFT HEART CATHETERIZATION WITH CORONARY ANGIOGRAM;  Surgeon: Peter M Martinique, MD;  Location: [facility] CATH LAB;  Service: Cardiovascular;  Laterality: N/A;  . NM MYOCAR PERF WALL MOTION  04/09/2010  Abnormal study - appears to be a small area of apical infarction. Reversible ischemia is not seen.  . TEE WITHOUT CARDIOVERSION N/A 12/21/2014   Procedure: TRANSESOPHAGEAL ECHOCARDIOGRAM (TEE);  Surgeon: Loreli Slot, MD;  Location: [facility] OR;  Service: Open Heart Surgery;  Laterality: N/A;  . TONSILLECTOMY    . TRANSTHORACIC ECHOCARDIOGRAM  12/2014   LVEF 60-65%, normal wall thickness, normal wall motion, diastolic dysfunction, indeterminate  LV filling pressure, normal LA size.    OB History    Gravida  5   Para  4   Term  4   Preterm      AB  1   Living  4     SAB  1   TAB      Ectopic      Multiple      Live Births  4            Home Medications    Prior to Admission medications   Medication Sig Start Date End Date Taking? Authorizing Provider  albuterol (PROVENTIL HFA;VENTOLIN HFA) 108 (90 Base) MCG/ACT inhaler Inhale 2 puffs into the lungs every 4 (four) hours as needed. For shortness of breath or wheezing 01/18/19  Yes Domenick Gong, MD  ALLOPURINOL PO Take by mouth.   Yes [provider]  aspirin EC 81 MG tablet Take 81 mg by mouth daily.   Yes [provider]  cetirizine (ZYRTEC) 10 MG tablet Take 10 mg by mouth daily.   Yes [provider]  colchicine 0.6 MG tablet Take 0.5 tablets (0.3 mg total) by mouth 2 (two) times daily. 06/20/18  Yes Danford, Earl Lites, MD  cycloSPORINE (RESTASIS) 0.05 % ophthalmic emulsion Place 1 drop into both eyes 2 (two) times daily. 10/29/17  Yes [provider]  diclofenac sodium (VOLTAREN) 1 % GEL Apply 2 g topically 4 (four) times daily. 03/24/19  Yes Burky, Barron Alvine, NP  fluticasone (FLONASE) 50 MCG/ACT nasal spray Place 2 sprays into both nostrils as needed for allergies or rhinitis.   Yes [provider]  glipiZIDE (GLUCOTROL) 10 MG tablet Take 10 mg by mouth 2 (two) times daily before a meal.   Yes [provider]  hydrochlorothiazide (HYDRODIURIL) 25 MG tablet Take 25 mg by mouth daily as needed (swelling).   Yes [provider]  LANTUS SOLOSTAR 100 UNIT/ML Solostar Pen Inject 60 Units into the skin at bedtime.  03/15/18  Yes [provider]  metoprolol tartrate (LOPRESSOR) 25 MG tablet TAKE 1 TABLET BY MOUTH TWICE DAILY 03/15/18  Yes Marykay Lex, MD  Multiple Vitamins-Calcium (MULTI-DAY/CALCIUM/EXTRA IRON) TABS Take 1 tablet by mouth daily. 05/31/18  Yes [provider]    simvastatin (ZOCOR) 40 MG tablet Take 40 mg by mouth at bedtime. 02/01/18  Yes [provider]  Fluticasone-Salmeterol (ADVAIR) 100-50 MCG/DOSE AEPB Inhale 1 puff into the lungs 2 (two) times daily.    [provider]  morphine (MSIR) 15 MG tablet Take 0.5 tablets (7.5 mg total) by mouth every 4 (four) hours as needed for severe pain. 12/09/18   Melene Plan, DO  Spacer/Aero-Holding Chambers (AEROCHAMBER PLUS) inhaler Use as instructed 01/18/19   Domenick Gong, MD    Family History Family History  Problem Relation Age of Onset  . Pancreatic cancer Mother   . CAD Mother        Open heart surgery 60s-70s  . Stroke Father   . Heart disease Sister        Unclear details  Social History Social History   Tobacco Use  . Smoking status: Never Smoker  . Smokeless tobacco: Never Used  Substance Use Topics  . Alcohol use: No  . Drug use: No     Allergies   Ace inhibitors, Lisinopril, Augmentin [amoxicillin-pot clavulanate], Mushroom extract complex, and Tetanus toxoids   Review of Systems Review of Systems Pertinent negatives listed in HPI Physical Exam Triage Vital Signs ED Triage Vitals  Enc Vitals Group     BP 01/20/20 1804 126/78     Pulse Rate 01/20/20 1804 81     Resp 01/20/20 1804 16     Temp 01/20/20 1804 98.3 F (36.8 C)     Temp Source 01/20/20 1804 Oral     SpO2 01/20/20 1804 99 %     Weight --      Height --      Head Circumference --      Peak Flow --      Pain Score 01/20/20 1805 7     Pain Loc --      Pain Edu? --      Excl. in [place]? --    No data found.  Updated Vital Signs BP 126/78   Pulse 81   Temp 98.3 F (36.8 C) (Oral)   Resp 16   SpO2 99%   Visual Acuity Right Eye Distance:   Left Eye Distance:   Bilateral Distance:    Right Eye Near:   Left Eye Near:    Bilateral Near:     Physical Exam Constitutional:      Appearance: She is ill-appearing.  HENT:     Right Ear: Hearing, tympanic membrane and external ear  normal.     Left Ear: Hearing and tympanic membrane normal.     Nose:     Right Turbinates: Swollen.     Left Turbinates: Enlarged and swollen.     Right Sinus: Maxillary sinus tenderness and frontal sinus tenderness present.     Left Sinus: Maxillary sinus tenderness and frontal sinus tenderness present.     Mouth/Throat:     Lips: Pink.     Pharynx: Oropharynx is clear. Uvula midline. Posterior oropharyngeal erythema present. No oropharyngeal exudate or uvula swelling.  Cardiovascular:     Rate and Rhythm: Normal rate and regular rhythm.  Pulmonary:     Effort: Pulmonary effort is normal.     Breath sounds: Normal air entry. Decreased breath sounds present. No wheezing or rhonchi.  Lymphadenopathy:     Cervical: No cervical adenopathy.  Skin:    General: Skin is warm and dry.  Neurological:     Mental Status: She is oriented to person, place, and time.  Psychiatric:        Attention and Perception: Attention normal.        Mood and Affect: Mood normal.      UC Treatments / Results  Labs (all labs ordered are listed, but only abnormal results are displayed) Labs Reviewed  NOVEL CORONAVIRUS, NAA (HOSP ORDER, SEND-OUT TO REF LAB; TAT 18-24 HRS)    EKG   Radiology No results found.  Procedures Procedures (including critical care time)  Medications Ordered in UC Medications - No data to display  Initial Impression / Assessment and Plan / UC Course  I have reviewed the triage vital signs and the nursing notes.  Pertinent labs & imaging results that were available during my care of the patient were reviewed by me and considered in my medical decision making (  see chart for details).    Acute recurrent pansinusitis- Doxycyline 100 mg BID x 10 days.Continue Flonase. Added Atrovent nasal spray -2 sprays per nares TID PRN for congestion.   Final Clinical Impressions(s) / UC Diagnoses   Final diagnoses:  Acute recurrent pansinusitis   Discharge Instructions   None      ED Prescriptions    Medication Sig Dispense Auth. Provider   doxycycline (VIBRAMYCIN) 100 MG capsule Take 1 capsule (100 mg total) by mouth 2 (two) times daily. 20 capsule Bing Neighbors, FNP   ipratropium (ATROVENT) 0.03 % nasal spray Place 2 sprays into both nostrils 2 (two) times daily. 30 mL Bing Neighbors, FNP     PDMP not reviewed this encounter.   Bing Neighbors, FNP 01/20/20 2242

## 2020-01-20 NOTE — ED Triage Notes (Addendum)
C/O significant sinus pressure to face x 4 wks with nasal congestion with progressive worsening.  Has not been taking any meds to help alleviate sxs.  Denies fevers.  C/O cough onset last week with some wheezing; has been using albuterol with relief.

## 2020-01-22 LAB — NOVEL CORONAVIRUS, NAA (HOSP ORDER, SEND-OUT TO REF LAB; TAT 18-24 HRS): SARS-CoV-2, NAA: NOT DETECTED

## 2020-02-19 ENCOUNTER — Ambulatory Visit: Payer: Medicare HMO | Attending: Internal Medicine

## 2020-02-19 DIAGNOSIS — Z23 Encounter for immunization: Secondary | ICD-10-CM | POA: Insufficient documentation

## 2020-02-19 NOTE — Progress Notes (Signed)
   Covid-19 Vaccination Clinic  Name:  Cheryl Gill    MRN: [id number] DOB: 11/10/1951  02/19/2020  Cheryl Gill was observed post Covid-19 immunization for 15 minutes without incident. She was provided with Vaccine Information Sheet and instruction to access the V-Safe system.   Cheryl Gill was instructed to call 911 with any severe reactions post vaccine: [name] Kitchen Difficulty breathing  . Swelling of face and throat  . A fast heartbeat  . A bad rash all over body  . Dizziness and weakness   Immunizations Administered    Name Date Dose VIS Date Route   Pfizer COVID-19 Vaccine 02/19/2020 11:06 AM 0.3 mL 11/24/2019 Intramuscular   Manufacturer: [employer], [employer]   Lot: [id number]   NDC: [address]-7

## 2020-02-28 ENCOUNTER — Ambulatory Visit
Admission: RE | Admit: 2020-02-28 | Discharge: 2020-02-28 | Disposition: A | Discharge status: Home / Self Care | Payer: Medicare HMO | Source: Ambulatory Visit | Attending: Internal Medicine | Admitting: Internal Medicine

## 2020-02-28 ENCOUNTER — Other Ambulatory Visit: Payer: Self-pay

## 2020-02-28 DIAGNOSIS — E2839 Other primary ovarian failure: Secondary | ICD-10-CM

## 2020-03-14 ENCOUNTER — Ambulatory Visit (INDEPENDENT_AMBULATORY_CARE_PROVIDER_SITE_OTHER): Payer: Medicare HMO

## 2020-03-14 ENCOUNTER — Encounter (HOSPITAL_COMMUNITY): Payer: Self-pay

## 2020-03-14 ENCOUNTER — Other Ambulatory Visit: Payer: Self-pay

## 2020-03-14 ENCOUNTER — Ambulatory Visit (HOSPITAL_COMMUNITY)
Admission: EM | Admit: 2020-03-14 | Discharge: 2020-03-14 | Disposition: A | Discharge status: Home / Self Care | Payer: Medicare HMO | Attending: Physician Assistant | Admitting: Physician Assistant

## 2020-03-14 DIAGNOSIS — S62656A Nondisplaced fracture of medial phalanx of right little finger, initial encounter for closed fracture: Secondary | ICD-10-CM

## 2020-03-14 DIAGNOSIS — M79644 Pain in right finger(s): Secondary | ICD-10-CM

## 2020-03-14 NOTE — Discharge Instructions (Signed)
Continue to buddy tape your fingers  Take tylenol and ibuprofen for pain  Please schedule follow up with your primary care in 7-10 days for reevalution

## 2020-03-14 NOTE — ED Triage Notes (Signed)
Pt present left hand/ pinky injury from hitting her hand on something. This injury occurred last Sunday. Her pinky is swollen and with little movement.

## 2020-03-14 NOTE — ED Provider Notes (Signed)
[facility]    CSN: [id number] Arrival date & time: 03/14/20  1530      History   Chief Complaint Chief Complaint  Patient presents with  . Hand Injury    right hand/pinky    HPI Cheryl Gill is a 69 y.o. female.   Patient presents for evaluation of injury to right hand.  She reports 4 to 5 days ago she and her right pinky and hand when playing with her son.  She reports immediate pain after this.  There is been swelling and some discoloration.  She reports pain is not significantly improved since that time.  Reports most of the pain is in her pinky however there is some pain in the bones of her hand.  She reports she is able to move the pinky and make a light fist.  Denies numbness or tingling in the pinky.       Past Medical History:  Diagnosis Date  . Asthma   . CAD (coronary artery disease)    a. 12/2014: NSTEMI with cath showing Ostial LAD ~95%, mLAD 80-90%; EF ~40-45%, LAD disease compromised major D1 branch. CABG recommended and performed with LIMA-LAD and SVG-D1.   [name] Kitchen CKD (chronic kidney disease), stage III   . Colitis   . Diabetes mellitus type 2, insulin dependent ([facility])   . Essential hypertension   . Hyperlipidemia with target LDL less than 70 01/28/2014  . Metabolic syndrome: DM, HTN, Obesity as well as HLD 12/18/2014  . NSTEMI (non-ST elevated myocardial infarction) ([facility]) 12/18/2014   Echo 1/6: EF 60-65%, no Regional WMA, Gr 1 DD  . Obesity    BMI ~35-36  . 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 Seasonal allergies     Patient Active Problem List   Diagnosis Date Noted  . Acute gout 06/19/2018  . Left leg pain 06/18/2018  . CKD (chronic kidney disease), stage III 06/18/2018  . Joint pain 06/18/2018  . Effusion of left knee   . Colitis, acute 03/05/2015  . Colitis 03/05/2015  . Lower abdominal pain   . 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 Coronary artery disease involving native coronary artery without angina pectoris 12/19/2014  . NSTEMI  (non-ST elevated myocardial infarction) ([facility]) 12/18/2014  . Obesity, Class I, BMI 30-34.9 12/18/2014  . Metabolic syndrome: DM, HTN, Obesity as well as HLD 12/18/2014  . Frequency of urination 07/17/2014  . Nocturia more than twice per night 07/17/2014  . Abdominal pain 01/28/2014  . Asthma, chronic 01/28/2014  . Hyperlipidemia with target LDL less than 70 01/28/2014  . Leukocytosis 01/28/2014  . Normocytic anemia 01/18/2013  . 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 Diabetes mellitus type 2, uncontrolled ([facility]) 01/16/2013  . Seasonal allergies     Past Surgical History:  Procedure Laterality Date  . ABDOMINAL HYSTERECTOMY    . CARDIAC SURGERY    . CORONARY ARTERY BYPASS GRAFT N/A 12/21/2014   Procedure: CORONARY ARTERY BYPASS GRAFTING (CABG) TIMES TWO USING LEFT INTERNAL MAMMARY AND RIGHT SAPHENOUS LEG VEIN HARVESTED ENDOSCOPICALLY;  Surgeon: Loreli Slot, MD;  Location: [facility] OR;  Service: Open Heart Surgery;  Laterality: N/A;  . LEFT HEART CATHETERIZATION WITH CORONARY ANGIOGRAM N/A 12/18/2014   Procedure: LEFT HEART CATHETERIZATION WITH CORONARY ANGIOGRAM;  Surgeon: Peter M [place]az[place], MD;  Location: [facility] CATH LAB;  Service: Cardiovascular;  Laterality: N/A;  . NM MYOCAR PERF WALL MOTION  04/09/2010   Abnormal  study - appears to be a small area of apical infarction. Reversible ischemia is not seen.  . TEE WITHOUT CARDIOVERSION N/A 12/21/2014   Procedure: TRANSESOPHAGEAL ECHOCARDIOGRAM (TEE);  Surgeon: Loreli Slot, MD;  Location: [facility] OR;  Service: Open Heart Surgery;  Laterality: N/A;  . TONSILLECTOMY    . TRANSTHORACIC ECHOCARDIOGRAM  12/2014   LVEF 60-65%, normal wall thickness, normal wall motion, diastolic dysfunction, indeterminate LV filling pressure, normal LA size.    OB History    Gravida  5   Para  4   Term  4   Preterm      AB  1   Living  4     SAB  1   TAB      Ectopic       Multiple      Live Births  4            Home Medications    Prior to Admission medications   Medication Sig Start Date End Date Taking? Authorizing Provider  albuterol (PROVENTIL HFA;VENTOLIN HFA) 108 (90 Base) MCG/ACT inhaler Inhale 2 puffs into the lungs every 4 (four) hours as needed. For shortness of breath or wheezing 01/18/19   Domenick Gong, MD  ALLOPURINOL PO Take by mouth.    [provider]  aspirin EC 81 MG tablet Take 81 mg by mouth daily.    [provider]  cetirizine (ZYRTEC) 10 MG tablet Take 10 mg by mouth daily.    [provider]  colchicine 0.6 MG tablet Take 0.5 tablets (0.3 mg total) by mouth 2 (two) times daily. 06/20/18   Danford, Earl Lites, MD  cycloSPORINE (RESTASIS) 0.05 % ophthalmic emulsion Place 1 drop into both eyes 2 (two) times daily. 10/29/17   [provider]  diclofenac sodium (VOLTAREN) 1 % GEL Apply 2 g topically 4 (four) times daily. 03/24/19   Georgetta Haber, NP  doxycycline (VIBRAMYCIN) 100 MG capsule Take 1 capsule (100 mg total) by mouth 2 (two) times daily. 01/20/20   Bing Neighbors, FNP  fluticasone (FLONASE) 50 MCG/ACT nasal spray Place 2 sprays into both nostrils as needed for allergies or rhinitis.    [provider]  Fluticasone-Salmeterol (ADVAIR) 100-50 MCG/DOSE AEPB Inhale 1 puff into the lungs 2 (two) times daily.    [provider]  glipiZIDE (GLUCOTROL) 10 MG tablet Take 10 mg by mouth 2 (two) times daily before a meal.    [provider]  hydrochlorothiazide (HYDRODIURIL) 25 MG tablet Take 25 mg by mouth daily as needed (swelling).    [provider]  ipratropium (ATROVENT) 0.03 % nasal spray Place 2 sprays into both nostrils 2 (two) times daily. 01/20/20   Bing Neighbors, FNP  LANTUS SOLOSTAR 100 UNIT/ML Solostar Pen Inject 60 Units into the skin at bedtime.  03/15/18   [provider]  metoprolol tartrate (LOPRESSOR) 25 MG tablet TAKE 1 TABLET  BY MOUTH TWICE DAILY 03/15/18   Marykay Lex, MD  morphine (MSIR) 15 MG tablet Take 0.5 tablets (7.5 mg total) by mouth every 4 (four) hours as needed for severe pain. 12/09/18   Melene Plan, DO  Multiple Vitamins-Calcium (MULTI-DAY/CALCIUM/EXTRA IRON) TABS Take 1 tablet by mouth daily. 05/31/18   [provider]  simvastatin (ZOCOR) 40 MG tablet Take 40 mg by mouth at bedtime. 02/01/18   [provider]  Spacer/Aero-Holding Chambers (AEROCHAMBER PLUS) inhaler Use as instructed 01/18/19   Domenick Gong, MD    Family History Family  History  Problem Relation Age of Onset  . Pancreatic cancer Mother   . CAD Mother        Open heart surgery 60s-70s  . Stroke Father   . Heart disease Sister        Unclear details    Social History Social History   Tobacco Use  . Smoking status: Never Smoker  . Smokeless tobacco: Never Used  Substance Use Topics  . Alcohol use: No  . Drug use: No     Allergies   Ace inhibitors, Lisinopril, Augmentin [amoxicillin-pot clavulanate], Mushroom extract complex, and Tetanus toxoids   Review of Systems Review of Systems  Musculoskeletal:       See HPI  Skin: Positive for color change.     Physical Exam Triage Vital Signs ED Triage Vitals  Enc Vitals Group     BP 03/14/20 1555 132/74     Pulse Rate 03/14/20 1555 60     Resp 03/14/20 1555 16     Temp 03/14/20 1555 98.2 F (36.8 C)     Temp Source 03/14/20 1555 Oral     SpO2 03/14/20 1555 99 %     Weight --      Height --      Head Circumference --      Peak Flow --      Pain Score 03/14/20 1553 7     Pain Loc --      Pain Edu? --      Excl. in [place]? --    No data found.  Updated Vital Signs BP 132/74 (BP Location: Left Arm)   Pulse 60   Temp 98.2 F (36.8 C) (Oral)   Resp 16   SpO2 99%   Visual Acuity Right Eye Distance:   Left Eye Distance:   Bilateral Distance:    Right Eye Near:   Left Eye Near:    Bilateral Near:     Physical Exam Vitals and  nursing note reviewed.  Constitutional:      General: She is not in acute distress.    Appearance: She is well-developed. She is not ill-appearing.  HENT:     Head: Normocephalic and atraumatic.  Cardiovascular:     Rate and Rhythm: Normal rate.  Pulmonary:     Effort: Pulmonary effort is normal. No respiratory distress.  Musculoskeletal:     Cervical back: Neck supple.     Comments: Right hand: Pinky with swelling and ecchymosis.  No obvious deformity tenderness to palpation over the PIP as well as phalangeal bones.  There is also tenderness at the MCP joint.  Patient able to make a loose fist limited due to pain.  There is also pain elicited over palpation of the fifth metacarpal.  Mild tenderness in the remaining digits however there is no significant swelling or ecchymosis.  Cap refill less than 2 seconds and sensation intact in the fifth digit.  Skin:    General: Skin is warm and dry.     Capillary Refill: Capillary refill takes less than 2 seconds.     Findings: Bruising present.  Neurological:     Mental Status: She is alert.     Sensory: No sensory deficit.      UC Treatments / Results  Labs (all labs ordered are listed, but only abnormal results are displayed) Labs Reviewed - No data to display  EKG   Radiology DG Hand Complete Right  Result Date: 03/14/2020 CLINICAL DATA:  Fifth ray right hand pain after  recent injury EXAM: RIGHT HAND - COMPLETE 3+ VIEW COMPARISON:  None. FINDINGS: Suggestion of a tiny nondisplaced intra-articular fracture at ulnar base of the middle phalanx in the right fifth finger with surrounding soft tissue swelling. No additional fracture. No dislocation. No suspicious focal osseous lesions. No significant arthropathy. No radiopaque foreign bodies. IMPRESSION: Suggestion of a tiny nondisplaced intra-articular fracture at the ulnar base of the middle phalanx in the right fifth finger, correlate with site of pain. Electronically Signed   By: Delbert Phenix M.D.   On: 03/14/2020 17:30    Procedures Procedures (including critical care time)  Medications Ordered in UC Medications - No data to display  Initial Impression / Assessment and Plan / UC Course  I have reviewed the triage vital signs and the nursing notes.  Pertinent labs & imaging results that were available during my care of the patient were reviewed by me and considered in my medical decision making (see chart for details).     #Finger fracture Is a 69 year old presenting with acute injury to right fifth digit.  Possible nondisplaced fracture at the proximal middle phalanx.  Given clinical correlation this is likely.  Patient did not tolerate buddy taping so placed in static splint with Coban.  Instructed patient to follow-up in 7 to 10 days with her primary care for reevaluation.  Patient verbalized understanding.  Tylenol and ibuprofen for pain. Final Clinical Impressions(s) / UC Diagnoses   Final diagnoses:  Closed nondisplaced fracture of middle phalanx of right little finger, initial encounter     Discharge Instructions     Continue to buddy tape your fingers  Take tylenol and ibuprofen for pain  Please schedule follow up with your primary care in 7-10 days for reevalution    ED Prescriptions    None     PDMP not reviewed this encounter.   Hermelinda Medicus, PA-C 03/14/20 1815

## 2020-03-26 ENCOUNTER — Ambulatory Visit: Payer: Medicare HMO | Attending: Internal Medicine

## 2020-03-26 DIAGNOSIS — Z23 Encounter for immunization: Secondary | ICD-10-CM

## 2020-03-26 NOTE — Progress Notes (Signed)
   Covid-19 Vaccination Clinic  Name:  Cheryl Gill    MRN: [id number] DOB: 06-09-1951  03/26/2020  [name] was observed post Covid-19 immunization for 30 minutes based on pre-vaccination screening without incident. She was provided with Vaccine Information Sheet and instruction to access the V-Safe system.   [name] was instructed to call 911 with any severe reactions post vaccine: [name] Kitchen Difficulty breathing  . Swelling of face and throat  . A fast heartbeat  . A bad rash all over body  . Dizziness and weakness   Immunizations Administered    Name Date Dose VIS Date Route   Pfizer COVID-19 Vaccine 03/26/2020  3:42 PM 0.3 mL 11/24/2019 Intramuscular   Manufacturer: [employer], [employer]   Lot: [id number]   NDC: [address]-7

## 2020-09-29 ENCOUNTER — Other Ambulatory Visit: Payer: Self-pay

## 2020-09-29 ENCOUNTER — Encounter (HOSPITAL_COMMUNITY): Payer: Self-pay | Admitting: Emergency Medicine

## 2020-09-29 ENCOUNTER — Ambulatory Visit (HOSPITAL_COMMUNITY)
Admission: EM | Admit: 2020-09-29 | Discharge: 2020-09-29 | Disposition: A | Discharge status: Home / Self Care | Payer: Medicare HMO | Attending: Emergency Medicine | Admitting: Emergency Medicine

## 2020-09-29 DIAGNOSIS — J019 Acute sinusitis, unspecified: Secondary | ICD-10-CM

## 2020-09-29 MED ORDER — DOXYCYCLINE HYCLATE 100 MG PO CAPS: 100.0000 mg | ORAL_CAPSULE | Freq: Two times a day (BID) | ORAL | 0 refills | Status: AC

## 2020-09-29 MED ORDER — BENZONATATE 200 MG PO CAPS: 200.0000 mg | ORAL_CAPSULE | Freq: Three times a day (TID) | ORAL | 0 refills | Status: DC | PRN

## 2020-09-29 MED ORDER — BENZONATATE 200 MG PO CAPS: 200.0000 mg | ORAL_CAPSULE | Freq: Three times a day (TID) | ORAL | 0 refills | Status: AC | PRN

## 2020-09-29 MED ORDER — PREDNISONE 10 MG PO TABS: ORAL_TABLET | ORAL | 0 refills | Status: DC

## 2020-09-29 NOTE — ED Provider Notes (Signed)
[facility]    CSN: [id number] Arrival date & time: 09/29/20  1213      History   Chief Complaint Chief Complaint  Patient presents with  . Nasal Congestion  . Otalgia  . Dizziness    HPI Rivka BarbaraGlenda Armen PickupMarie Blundell is a 69 y.o. female history of CAD, CKD, DM type II, hypertension, prior CABG presenting today for evaluation of URI symptoms dizziness and blurred vision.  She has had sinus symptoms and sinus pressure for 4 to 5 weeks, feels similar to prior sinus infections.  She has also had cough and some wheezing.  Has been using her Advair and albuterol along with Flonase nasal spray and Tylenol.  Denies close sick contacts.  Denies fevers.  Denies chest pain or shortness of breath.  She reports in the past with steroid she is done better with 10-day courses rather than shorter 5-day courses of steroids to help relieve sinus pressure. Blood sugars 140-200 recently.  Reports blurry vision is intermittent, reports that she has been seen by an ophthalmologist and reports that she has early stages of glaucoma.  HPI  Past Medical History:  Diagnosis Date  . Asthma   . CAD (coronary artery disease)    a. 12/2014: NSTEMI with cath showing Ostial LAD ~95%, mLAD 80-90%; EF ~40-45%, LAD disease compromised major D1 branch. CABG recommended and performed with LIMA-LAD and SVG-D1.   [name] Kitchen. CKD (chronic kidney disease), stage III (HCC)   . Colitis   . Diabetes mellitus type 2, insulin dependent (HCC)   . Essential hypertension   . Hyperlipidemia with target LDL less than 70 01/28/2014  . Metabolic syndrome: DM, HTN, Obesity as well as HLD 12/18/2014  . NSTEMI (non-ST elevated myocardial infarction) (HCC) 12/18/2014   Echo 1/6: EF 60-65%, no Regional WMA, Gr 1 DD  . Obesity    BMI ~35-36  . 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 Seasonal allergies     Patient Active Problem List   Diagnosis Date Noted  . Acute gout 06/19/2018  . Left leg pain 06/18/2018  . CKD (chronic kidney  disease), stage III (HCC) 06/18/2018  . Joint pain 06/18/2018  . Effusion of left knee   . Colitis, acute 03/05/2015  . Colitis 03/05/2015  . Lower abdominal pain   . 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 Coronary artery disease involving native coronary artery without angina pectoris 12/19/2014  . NSTEMI (non-ST elevated myocardial infarction) (HCC) 12/18/2014  . Obesity, Class I, BMI 30-34.9 12/18/2014  . Metabolic syndrome: DM, HTN, Obesity as well as HLD 12/18/2014  . Frequency of urination 07/17/2014  . Nocturia more than twice per night 07/17/2014  . Abdominal pain 01/28/2014  . Asthma, chronic 01/28/2014  . Hyperlipidemia with target LDL less than 70 01/28/2014  . Leukocytosis 01/28/2014  . Normocytic anemia 01/18/2013  . 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 Diabetes mellitus type 2, uncontrolled (HCC) 01/16/2013  . Seasonal allergies     Past Surgical History:  Procedure Laterality Date  . ABDOMINAL HYSTERECTOMY    . CARDIAC SURGERY    . CORONARY ARTERY BYPASS GRAFT N/A 12/21/2014   Procedure: CORONARY ARTERY BYPASS GRAFTING (CABG) TIMES TWO USING LEFT INTERNAL MAMMARY AND RIGHT SAPHENOUS LEG VEIN HARVESTED ENDOSCOPICALLY;  Surgeon: Loreli SlotSteven C Hendrickson, MD;  [facility];  Service: Open Heart Surgery;  Laterality: N/A;  . LEFT HEART CATHETERIZATION WITH CORONARY ANGIOGRAM N/A 12/18/2014   Procedure:  LEFT HEART CATHETERIZATION WITH CORONARY ANGIOGRAM;  Surgeon: Peter M Swaziland, MD;  Location: [facility] CATH LAB;  Service: Cardiovascular;  Laterality: N/A;  . NM MYOCAR PERF WALL MOTION  04/09/2010   Abnormal study - appears to be a small area of apical infarction. Reversible ischemia is not seen.  . TEE WITHOUT CARDIOVERSION N/A 12/21/2014   Procedure: TRANSESOPHAGEAL ECHOCARDIOGRAM (TEE);  Surgeon: Loreli Slot, MD;  Location: [facility] OR;  Service: Open Heart Surgery;  Laterality: N/A;  . TONSILLECTOMY    .  TRANSTHORACIC ECHOCARDIOGRAM  12/2014   LVEF 60-65%, normal wall thickness, normal wall motion, diastolic dysfunction, indeterminate LV filling pressure, normal LA size.    OB History    Gravida  5   Para  4   Term  4   Preterm      AB  1   Living  4     SAB  1   TAB      Ectopic      Multiple      Live Births  4            Home Medications    Prior to Admission medications   Medication Sig Start Date End Date Taking? Authorizing Provider  albuterol (PROVENTIL HFA;VENTOLIN HFA) 108 (90 Base) MCG/ACT inhaler Inhale 2 puffs into the lungs every 4 (four) hours as needed. For shortness of breath or wheezing 01/18/19   Domenick Gong, MD  ALLOPURINOL PO Take by mouth.    [provider]  aspirin EC 81 MG tablet Take 81 mg by mouth daily.    [provider]  benzonatate (TESSALON) 200 MG capsule Take 1 capsule (200 mg total) by mouth 3 (three) times daily as needed for cough. 09/29/20 10/29/20  Adeel Guiffre C, PA-C  cetirizine (ZYRTEC) 10 MG tablet Take 10 mg by mouth daily.    [provider]  colchicine 0.6 MG tablet Take 0.5 tablets (0.3 mg total) by mouth 2 (two) times daily. 06/20/18   Danford, Earl Lites, MD  cycloSPORINE (RESTASIS) 0.05 % ophthalmic emulsion Place 1 drop into both eyes 2 (two) times daily. 10/29/17   [provider]  diclofenac sodium (VOLTAREN) 1 % GEL Apply 2 g topically 4 (four) times daily. 03/24/19   Georgetta Haber, NP  doxycycline (VIBRAMYCIN) 100 MG capsule Take 1 capsule (100 mg total) by mouth 2 (two) times daily for 10 days. 09/29/20 10/09/20  Harnoor Reta C, PA-C  fluticasone (FLONASE) 50 MCG/ACT nasal spray Place 2 sprays into both nostrils as needed for allergies or rhinitis.    [provider]  Fluticasone-Salmeterol (ADVAIR) 100-50 MCG/DOSE AEPB Inhale 1 puff into the lungs 2 (two) times daily.    [provider]  glipiZIDE (GLUCOTROL) 10 MG tablet Take 10 mg by mouth 2  (two) times daily before a meal.    [provider]  hydrochlorothiazide (HYDRODIURIL) 25 MG tablet Take 25 mg by mouth daily as needed (swelling).    [provider]  ipratropium (ATROVENT) 0.03 % nasal spray Place 2 sprays into both nostrils 2 (two) times daily. 01/20/20   Bing Neighbors, FNP  LANTUS SOLOSTAR 100 UNIT/ML Solostar Pen Inject 60 Units into the skin at bedtime.  03/15/18   [provider]  metoprolol tartrate (LOPRESSOR) 25 MG tablet TAKE 1 TABLET BY MOUTH TWICE DAILY 03/15/18   Marykay Lex, MD  morphine (MSIR) 15 MG tablet Take 0.5 tablets (7.5 mg total) by mouth every 4 (four) hours as needed  for severe pain. 12/09/18   Melene Plan, DO  Multiple Vitamins-Calcium (MULTI-DAY/CALCIUM/EXTRA IRON) TABS Take 1 tablet by mouth daily. 05/31/18   [provider]  predniSONE (DELTASONE) 10 MG tablet Begin with 6 tabs on day 1 and 2, 5 tab on day 3 and 4, 4 tab on day 5 and 6, 3 tab on day 7and 8, 2 tab on day 9 and 10, 1 tab on day 11 and12-take with food 09/29/20   Yuri Fana C, PA-C  simvastatin (ZOCOR) 40 MG tablet Take 40 mg by mouth at bedtime. 02/01/18   [provider]  Spacer/Aero-Holding Chambers (AEROCHAMBER PLUS) inhaler Use as instructed 01/18/19   Domenick Gong, MD    Family History Family History  Problem Relation Age of Onset  . Pancreatic cancer Mother   . CAD Mother        Open heart surgery 60s-70s  . Stroke Father   . Heart disease Sister        Unclear details    Social History Social History   Tobacco Use  . Smoking status: Never Smoker  . Smokeless tobacco: Never Used  Vaping Use  . Vaping Use: Never used  Substance Use Topics  . Alcohol use: No  . Drug use: No     Allergies   Ace inhibitors, Lisinopril, Augmentin [amoxicillin-pot clavulanate], Mushroom extract complex, and Tetanus toxoids   Review of Systems Review of Systems  Constitutional: Negative for fatigue and fever.  HENT: Positive  for congestion, sinus pressure and sneezing. Negative for sore throat.   Eyes: Positive for visual disturbance. Negative for photophobia and pain.  Respiratory: Negative for cough and shortness of breath.   Cardiovascular: Negative for chest pain.  Gastrointestinal: Negative for abdominal pain, nausea and vomiting.  Genitourinary: Negative for decreased urine volume and hematuria.  Musculoskeletal: Negative for myalgias, neck pain and neck stiffness.  Neurological: Positive for dizziness. Negative for syncope, facial asymmetry, speech difficulty, weakness, light-headedness, numbness and headaches.     Physical Exam Triage Vital Signs ED Triage Vitals  Enc Vitals Group     BP      Pulse      Resp      Temp      Temp src      SpO2      Weight      Height      Head Circumference      Peak Flow      Pain Score      Pain Loc      Pain Edu?      Excl. in GC?    No data found.  Updated Vital Signs BP (!) 149/78 (BP Location: Right Arm)   Pulse 60   Temp 98.5 F (36.9 C) (Oral)   Resp 18   SpO2 100%   Visual Acuity Right Eye Distance:   Left Eye Distance:   Bilateral Distance:    Right Eye Near:   Left Eye Near:    Bilateral Near:     Physical Exam Vitals and nursing note reviewed.  Constitutional:      Appearance: She is well-developed.     Comments: No acute distress  HENT:     Head: Normocephalic and atraumatic.     Ears:     Comments: Bilateral ears without tenderness to palpation of external auricle, tragus and mastoid, EAC's without erythema or swelling, TM's with good bony landmarks and cone of light. Non erythematous.     Nose: Nose normal.  Mouth/Throat:     Comments: Oral mucosa pink and moist, no tonsillar enlargement or exudate. Posterior pharynx patent and nonerythematous, no uvula deviation or swelling. Normal phonation. Eyes:     Conjunctiva/sclera: Conjunctivae normal.  Cardiovascular:     Rate and Rhythm: Normal rate.  Pulmonary:      Effort: Pulmonary effort is normal. No respiratory distress.     Comments: Breathing comfortably at rest, CTABL, no wheezing, rales or other adventitious sounds auscultated Abdominal:     General: There is no distension.  Musculoskeletal:        General: Normal range of motion.     Cervical back: Neck supple.  Skin:    General: Skin is warm and dry.  Neurological:     Mental Status: She is alert and oriented to person, place, and time.      UC Treatments / Results  Labs (all labs ordered are listed, but only abnormal results are displayed) Labs Reviewed - No data to display  EKG   Radiology No results found.  Procedures Procedures (including critical care time)  Medications Ordered in UC Medications - No data to display  Initial Impression / Assessment and Plan / UC Course  I have reviewed the triage vital signs and the nursing notes.  Pertinent labs & imaging results that were available during my care of the patient were reviewed by me and considered in my medical decision making (see chart for details).     Covering for sinusitis with doxycycline, recommended prednisone course given associated wheezing and persistent inflammation with likely associated vertigo, patient requested 10-day course, discussed risk/benefit in setting of diabetes, opted to proceed but advised to have close monitoring of sugars and strict adherence to diabetes medicines.  Tessalon for cough.  Discussed strict return precautions. Patient verbalized understanding and is agreeable with plan.  Final Clinical Impressions(s) / UC Diagnoses   Final diagnoses:  Acute sinusitis with symptoms > 10 days     Discharge Instructions     Begin doxycycline twice daily for the next 10 days to treat sinus infection Begin prednisone taper over the next 1 to 2 weeks-begin with 6 tablets, decrease by 1 tablet every 2 days until complete, take with food and in the morning Monitor sugars while on this  medicine. I have sent in a cough medicine that will be mailed to you Follow-up if not improving or worsening    ED Prescriptions    Medication Sig Dispense Auth. Provider   doxycycline (VIBRAMYCIN) 100 MG capsule Take 1 capsule (100 mg total) by mouth 2 (two) times daily for 10 days. 20 capsule Kyle Luppino C, PA-C   predniSONE (DELTASONE) 10 MG tablet Begin with 6 tabs on day 1 and 2, 5 tab on day 3 and 4, 4 tab on day 5 and 6, 3 tab on day 7and 8, 2 tab on day 9 and 10, 1 tab on day 11 and12-take with food 42 tablet Ari Bernabei C, PA-C   benzonatate (TESSALON) 200 MG capsule Take 1 capsule (200 mg total) by mouth 3 (three) times daily as needed for cough. 84 capsule Sable Knoles, Beatrice C, PA-C     PDMP not reviewed this encounter.   Lew Dawes, PA-C 09/29/20 1436

## 2020-09-29 NOTE — Discharge Instructions (Signed)
Begin doxycycline twice daily for the next 10 days to treat sinus infection Begin prednisone taper over the next 1 to 2 weeks-begin with 6 tablets, decrease by 1 tablet every 2 days until complete, take with food and in the morning Monitor sugars while on this medicine. I have sent in a cough medicine that will be mailed to you Follow-up if not improving or worsening

## 2020-09-29 NOTE — ED Triage Notes (Signed)
Pt presents with nasal congestion, bilateral ear pain, and dizziness xs 4-5 weeks.

## 2020-10-05 ENCOUNTER — Other Ambulatory Visit: Payer: Self-pay

## 2020-10-05 ENCOUNTER — Emergency Department (HOSPITAL_COMMUNITY)
Admission: EM | Admit: 2020-10-05 | Discharge: 2020-10-05 | Disposition: A | Discharge status: Home / Self Care | Payer: Medicare HMO | Attending: Emergency Medicine | Admitting: Emergency Medicine

## 2020-10-05 DIAGNOSIS — N183 Chronic kidney disease, stage 3 unspecified: Secondary | ICD-10-CM | POA: Diagnosis not present

## 2020-10-05 DIAGNOSIS — J45909 Unspecified asthma, uncomplicated: Secondary | ICD-10-CM | POA: Insufficient documentation

## 2020-10-05 DIAGNOSIS — I129 Hypertensive chronic kidney disease with stage 1 through stage 4 chronic kidney disease, or unspecified chronic kidney disease: Secondary | ICD-10-CM | POA: Insufficient documentation

## 2020-10-05 DIAGNOSIS — Z794 Long term (current) use of insulin: Secondary | ICD-10-CM | POA: Diagnosis not present

## 2020-10-05 DIAGNOSIS — Z79899 Other long term (current) drug therapy: Secondary | ICD-10-CM | POA: Insufficient documentation

## 2020-10-05 DIAGNOSIS — E119 Type 2 diabetes mellitus without complications: Secondary | ICD-10-CM | POA: Insufficient documentation

## 2020-10-05 DIAGNOSIS — I2510 Atherosclerotic heart disease of native coronary artery without angina pectoris: Secondary | ICD-10-CM | POA: Diagnosis not present

## 2020-10-05 DIAGNOSIS — Z951 Presence of aortocoronary bypass graft: Secondary | ICD-10-CM | POA: Insufficient documentation

## 2020-10-05 DIAGNOSIS — M25561 Pain in right knee: Secondary | ICD-10-CM

## 2020-10-05 DIAGNOSIS — M25562 Pain in left knee: Secondary | ICD-10-CM | POA: Diagnosis present

## 2020-10-05 DIAGNOSIS — Z7982 Long term (current) use of aspirin: Secondary | ICD-10-CM | POA: Insufficient documentation

## 2020-10-05 MED ORDER — OXYCODONE-ACETAMINOPHEN 5-325 MG PO TABS
1.0000 | ORAL_TABLET | Freq: Once | ORAL | Status: AC
  Administered 2020-10-05: 1 via ORAL
  Filled 2020-10-05: qty 1

## 2020-10-05 MED ORDER — ACETAMINOPHEN 325 MG PO TABS
650.0000 mg | ORAL_TABLET | Freq: Once | ORAL | Status: AC
  Administered 2020-10-05: 650 mg via ORAL
  Filled 2020-10-05: qty 2

## 2020-10-05 NOTE — Progress Notes (Signed)
Orthopedic Tech Progress Note Patient Details:  Cheryl Gill March 20, 1951 [id number]  Ortho Devices Type of Ortho Device: Knee Sleeve Ortho Device/Splint Location: Left Lower Extremity Ortho Device/Splint Interventions: Ordered, Application   Post Interventions Patient Tolerated: Fair Instructions Provided: Adjustment of device, Care of device, Poper ambulation with device   Gerald Stabs 10/05/2020, 10:46 AM

## 2020-10-05 NOTE — Discharge Instructions (Addendum)
You were seen in the ER for right knee pain  X-ray a couple of years ago showed arthritis and fluid in your joint.    Your pain is likely from inflammation in the cartilage and other soft tissues.  We decided to defer x-rays emergently today.  Ice. Rest. Elevate your leg. Take [phone] mg acetaminophen every 6 hours for the next 3 days. Use your knee sleeve to help with compression and stability.   Go to your orthopedic appointment on Monday, they may choose to do x-ray, CT or MRI at that time.

## 2020-10-05 NOTE — ED Provider Notes (Signed)
[facility] EMERGENCY DEPARTMENT Provider Note   CSN: [id number] Arrival date & time: 10/05/20  6269     History Chief Complaint  Patient presents with  . Leg Pain  . Knee Pain    Cheryl Gill is a 69 y.o. female presents to the ED for evaluation of knee pain, bilateral but worse on the left side.  This began suddenly while at work yesterday.  States that she was going through a door and turned on her leg awkwardly and had sudden pain in the left knee.  Pain is worse with full extension, palpation.  No interventions.  Patient does have an appointment with orthopedist on Monday.  Reports previously having gout flare in this knee as well as being told she had fluid in the knee.  Denies significant swelling.  Denies distal leg swelling, redness, calf pain, tingling or numbness.  States the pain today feels slightly different than his.  HPI     Past Medical History:  Diagnosis Date  . Asthma   . CAD (coronary artery disease)    a. 12/2014: NSTEMI with cath showing Ostial LAD ~95%, mLAD 80-90%; EF ~40-45%, LAD disease compromised major D1 branch. CABG recommended and performed with LIMA-LAD and SVG-D1.   [name] Kitchen CKD (chronic kidney disease), stage III (HCC)   . Colitis   . Diabetes mellitus type 2, insulin dependent (HCC)   . Essential hypertension   . Hyperlipidemia with target LDL less than 70 01/28/2014  . Metabolic syndrome: DM, HTN, Obesity as well as HLD 12/18/2014  . NSTEMI (non-ST elevated myocardial infarction) (HCC) 12/18/2014   Echo 1/6: EF 60-65%, no Regional WMA, Gr 1 DD  . Obesity    BMI ~35-36  . 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 Seasonal allergies     Patient Active Problem List   Diagnosis Date Noted  . Acute gout 06/19/2018  . Left leg pain 06/18/2018  . CKD (chronic kidney disease), stage III (HCC) 06/18/2018  . Joint pain 06/18/2018  . Effusion of left knee   . Colitis, acute 03/05/2015  . Colitis 03/05/2015  . Lower  abdominal pain   . 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 Coronary artery disease involving native coronary artery without angina pectoris 12/19/2014  . NSTEMI (non-ST elevated myocardial infarction) (HCC) 12/18/2014  . Obesity, Class I, BMI 30-34.9 12/18/2014  . Metabolic syndrome: DM, HTN, Obesity as well as HLD 12/18/2014  . Frequency of urination 07/17/2014  . Nocturia more than twice per night 07/17/2014  . Abdominal pain 01/28/2014  . Asthma, chronic 01/28/2014  . Hyperlipidemia with target LDL less than 70 01/28/2014  . Leukocytosis 01/28/2014  . Normocytic anemia 01/18/2013  . 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 Diabetes mellitus type 2, uncontrolled (HCC) 01/16/2013  . Seasonal allergies     Past Surgical History:  Procedure Laterality Date  . ABDOMINAL HYSTERECTOMY    . CARDIAC SURGERY    . CORONARY ARTERY BYPASS GRAFT N/A 12/21/2014   Procedure: CORONARY ARTERY BYPASS GRAFTING (CABG) TIMES TWO USING LEFT INTERNAL MAMMARY AND RIGHT SAPHENOUS LEG VEIN HARVESTED ENDOSCOPICALLY;  Surgeon: Loreli Slot, MD;  Location: [facility] OR;  Service: Open Heart Surgery;  Laterality: N/A;  . LEFT HEART CATHETERIZATION WITH CORONARY ANGIOGRAM N/A 12/18/2014   Procedure: LEFT HEART CATHETERIZATION WITH CORONARY ANGIOGRAM;  Surgeon: Peter M Swaziland, MD;  Location: [facility] CATH LAB;  Service: Cardiovascular;  Laterality: N/A;  .  NM MYOCAR PERF WALL MOTION  04/09/2010   Abnormal study - appears to be a small area of apical infarction. Reversible ischemia is not seen.  . TEE WITHOUT CARDIOVERSION N/A 12/21/2014   Procedure: TRANSESOPHAGEAL ECHOCARDIOGRAM (TEE);  Surgeon: Loreli Slot, MD;  Location: [facility] OR;  Service: Open Heart Surgery;  Laterality: N/A;  . TONSILLECTOMY    . TRANSTHORACIC ECHOCARDIOGRAM  12/2014   LVEF 60-65%, normal wall thickness, normal wall motion, diastolic dysfunction, indeterminate LV filling pressure, normal LA  size.     OB History    Gravida  5   Para  4   Term  4   Preterm      AB  1   Living  4     SAB  1   TAB      Ectopic      Multiple      Live Births  4           Family History  Problem Relation Age of Onset  . Pancreatic cancer Mother   . CAD Mother        Open heart surgery 60s-70s  . Stroke Father   . Heart disease Sister        Unclear details    Social History   Tobacco Use  . Smoking status: Never Smoker  . Smokeless tobacco: Never Used  Vaping Use  . Vaping Use: Never used  Substance Use Topics  . Alcohol use: No  . Drug use: No    Home Medications Prior to Admission medications   Medication Sig Start Date End Date Taking? Authorizing Provider  albuterol (PROVENTIL HFA;VENTOLIN HFA) 108 (90 Base) MCG/ACT inhaler Inhale 2 puffs into the lungs every 4 (four) hours as needed. For shortness of breath or wheezing 01/18/19   Domenick Gong, MD  ALLOPURINOL PO Take by mouth.    [provider]  aspirin EC 81 MG tablet Take 81 mg by mouth daily.    [provider]  benzonatate (TESSALON) 200 MG capsule Take 1 capsule (200 mg total) by mouth 3 (three) times daily as needed for cough. 09/29/20 10/29/20  Wieters, Hallie C, PA-C  cetirizine (ZYRTEC) 10 MG tablet Take 10 mg by mouth daily.    [provider]  colchicine 0.6 MG tablet Take 0.5 tablets (0.3 mg total) by mouth 2 (two) times daily. 06/20/18   Danford, Earl Lites, MD  cycloSPORINE (RESTASIS) 0.05 % ophthalmic emulsion Place 1 drop into both eyes 2 (two) times daily. 10/29/17   [provider]  diclofenac sodium (VOLTAREN) 1 % GEL Apply 2 g topically 4 (four) times daily. 03/24/19   Georgetta Haber, NP  doxycycline (VIBRAMYCIN) 100 MG capsule Take 1 capsule (100 mg total) by mouth 2 (two) times daily for 10 days. 09/29/20 10/09/20  Wieters, Hallie C, PA-C  fluticasone (FLONASE) 50 MCG/ACT nasal spray Place 2 sprays into both nostrils as needed for allergies  or rhinitis.    [provider]  Fluticasone-Salmeterol (ADVAIR) 100-50 MCG/DOSE AEPB Inhale 1 puff into the lungs 2 (two) times daily.    [provider]  glipiZIDE (GLUCOTROL) 10 MG tablet Take 10 mg by mouth 2 (two) times daily before a meal.    [provider]  hydrochlorothiazide (HYDRODIURIL) 25 MG tablet Take 25 mg by mouth daily as needed (swelling).    [provider]  ipratropium (ATROVENT) 0.03 % nasal spray Place 2 sprays into both nostrils 2 (two) times daily. 01/20/20  Bing Neighbors, FNP  LANTUS SOLOSTAR 100 UNIT/ML Solostar Pen Inject 60 Units into the skin at bedtime.  03/15/18   [provider]  metoprolol tartrate (LOPRESSOR) 25 MG tablet TAKE 1 TABLET BY MOUTH TWICE DAILY 03/15/18   Marykay Lex, MD  morphine (MSIR) 15 MG tablet Take 0.5 tablets (7.5 mg total) by mouth every 4 (four) hours as needed for severe pain. 12/09/18   Melene Plan, DO  Multiple Vitamins-Calcium (MULTI-DAY/CALCIUM/EXTRA IRON) TABS Take 1 tablet by mouth daily. 05/31/18   [provider]  predniSONE (DELTASONE) 10 MG tablet Begin with 6 tabs on day 1 and 2, 5 tab on day 3 and 4, 4 tab on day 5 and 6, 3 tab on day 7and 8, 2 tab on day 9 and 10, 1 tab on day 11 and12-take with food 09/29/20   Wieters, Hallie C, PA-C  simvastatin (ZOCOR) 40 MG tablet Take 40 mg by mouth at bedtime. 02/01/18   [provider]  Spacer/Aero-Holding Chambers (AEROCHAMBER PLUS) inhaler Use as instructed 01/18/19   Domenick Gong, MD    Allergies    Ace inhibitors, Lisinopril, Augmentin [amoxicillin-pot clavulanate], Mushroom extract complex, and Tetanus toxoids  Review of Systems   Review of Systems  Musculoskeletal: Positive for arthralgias.  All other systems reviewed and are negative.   Physical Exam Updated Vital Signs BP 132/77   Pulse 66   Temp 97.7 F (36.5 C)   Resp 20   Ht 5\' 1"  (1.549 m)   Wt 87.5 kg   SpO2 100%   BMI 36.47 kg/m   Physical  Exam Constitutional:      Appearance: She is well-developed.  HENT:     Head: Normocephalic.     Nose: Nose normal.  Eyes:     General: Lids are normal.  Cardiovascular:     Rate and Rhythm: Normal rate.  Pulmonary:     Effort: Pulmonary effort is normal. No respiratory distress.  Musculoskeletal:        General: Swelling and tenderness present. Normal range of motion.     Cervical back: Normal range of motion.     Comments: Left knee with mild tenderness medial joint line. Pain with full extension. Full flexion. Normal J tracking of patella. No popliteal fullness, tenderness. LLE pulses intact distally. No calf tenderness. Sensation intact LLE.   Neurological:     Mental Status: She is alert.  Psychiatric:        Behavior: Behavior normal.     ED Results / Procedures / Treatments   Labs (all labs ordered are listed, but only abnormal results are displayed) Labs Reviewed - No data to display  EKG None  Radiology No results found.  Procedures Procedures (including critical care time)  Medications Ordered in ED Medications  oxyCODONE-acetaminophen (PERCOCET/ROXICET) 5-325 MG per tablet 1 tablet (1 tablet Oral Given 10/05/20 1028)  acetaminophen (TYLENOL) tablet 650 mg (650 mg Oral Given 10/05/20 1028)    ED Course  I have reviewed the triage vital signs and the nursing notes.  Pertinent labs & imaging results that were available during my care of the patient were reviewed by me and considered in my medical decision making (see chart for details).    MDM Rules/Calculators/A&P                          EMR triage and nursing notes reviewed to assist with history and MDM  Exam benign, mild tenderness but  with good ROM. Normal sensation, pulses distally. No trauma. Clinical presentation most consistent with soft tissue injury of knee vs acute OA flare.  No suspicion for fracture, dislocation, septic arthritis, gout, DVT.   Discussed emergent x-rays may not be  necessary. She agrees to defer x-rays in ER. She has appointment with orthopedist in 2 days.   Given percocet and tylenol, knee sleeve. She has a cane for walking.   Return precautions given. Patient in areement with POC.  Final Clinical Impression(s) / ED Diagnoses Final diagnoses:  Acute pain of right knee    Rx / DC Orders ED Discharge Orders    None       Liberty HandyGibbons, Montario Zilka J, PA-C 10/05/20 1032    Vanetta MuldersZackowski, Scott, MD 10/06/20 [phone]

## 2020-10-05 NOTE — ED Triage Notes (Signed)
Pt. Stated, I started having leg and knee pain when I walk started yesterday.

## 2020-10-05 NOTE — ED Notes (Signed)
ED Provider at bedside. 

## 2020-10-23 ENCOUNTER — Other Ambulatory Visit: Payer: Self-pay | Admitting: Internal Medicine

## 2020-10-23 DIAGNOSIS — Z1231 Encounter for screening mammogram for malignant neoplasm of breast: Secondary | ICD-10-CM

## 2020-12-04 ENCOUNTER — Ambulatory Visit
Admission: RE | Admit: 2020-12-04 | Discharge: 2020-12-04 | Disposition: A | Discharge status: Home / Self Care | Payer: Medicare HMO | Source: Ambulatory Visit | Attending: Internal Medicine | Admitting: Internal Medicine

## 2020-12-04 ENCOUNTER — Other Ambulatory Visit: Payer: Self-pay

## 2020-12-04 DIAGNOSIS — Z1231 Encounter for screening mammogram for malignant neoplasm of breast: Secondary | ICD-10-CM

## 2021-01-06 ENCOUNTER — Other Ambulatory Visit: Payer: Self-pay | Admitting: Orthopedic Surgery

## 2021-01-06 DIAGNOSIS — M545 Low back pain, unspecified: Secondary | ICD-10-CM

## 2021-01-09 ENCOUNTER — Other Ambulatory Visit: Payer: Self-pay | Admitting: Internal Medicine

## 2021-01-10 LAB — COMPLETE METABOLIC PANEL WITH GFR
AG Ratio: 1.3 (calc) (ref 1.0–2.5)
ALT: 16 U/L (ref 6–29)
AST: 21 U/L (ref 10–35)
Albumin: 3.9 g/dL (ref 3.6–5.1)
Alkaline phosphatase (APISO): 72 U/L (ref 37–153)
BUN/Creatinine Ratio: 17 (calc) (ref 6–22)
BUN: 24 mg/dL (ref 7–25)
CO2: 24 mmol/L (ref 20–32)
Calcium: 9 mg/dL (ref 8.6–10.4)
Chloride: 110 mmol/L (ref 98–110)
Creat: 1.43 mg/dL — ABNORMAL HIGH (ref 0.60–0.93)
GFR, Est African American: 43 mL/min/{1.73_m2} — ABNORMAL LOW (ref >=60)
GFR, Est Non African American: 37 mL/min/{1.73_m2} — ABNORMAL LOW (ref >=60)
Globulin: 3.1 g/dL (calc) (ref 1.9–3.7)
Glucose, Bld: 66 mg/dL (ref 65–99)
Potassium: 4.7 mmol/L (ref 3.5–5.3)
Sodium: 146 mmol/L (ref 135–146)
Total Bilirubin: 0.4 mg/dL (ref 0.2–1.2)
Total Protein: 7 g/dL (ref 6.1–8.1)

## 2021-01-10 LAB — LIPID PANEL
Cholesterol: 107 mg/dL (ref <200)
HDL: 33 mg/dL — ABNORMAL LOW (ref >=50)
LDL Cholesterol (Calc): 53 mg/dL (calc)
Non-HDL Cholesterol (Calc): 74 mg/dL (calc) (ref <130)
Total CHOL/HDL Ratio: 3.2 (calc) (ref <5.0)
Triglycerides: 119 mg/dL (ref <150)

## 2021-01-10 LAB — CBC
HCT: 35.7 % (ref 35.0–45.0)
Hemoglobin: 12.3 g/dL (ref 11.7–15.5)
MCH: 33.3 pg — ABNORMAL HIGH (ref 27.0–33.0)
MCHC: 34.5 g/dL (ref 32.0–36.0)
MCV: 96.7 fL (ref 80.0–100.0)
MPV: 10.2 fL (ref 7.5–12.5)
Platelets: 280 10*3/uL (ref 140–400)
RBC: 3.69 10*6/uL — ABNORMAL LOW (ref 3.80–5.10)
RDW: 12 % (ref 11.0–15.0)
WBC: 11.4 10*3/uL — ABNORMAL HIGH (ref 3.8–10.8)

## 2021-01-10 LAB — TSH: TSH: 1.69 mIU/L (ref 0.40–4.50)

## 2021-01-10 LAB — URIC ACID: Uric Acid, Serum: 7 mg/dL (ref 2.5–7.0)

## 2021-01-10 LAB — VITAMIN D 25 HYDROXY (VIT D DEFICIENCY, FRACTURES): Vit D, 25-Hydroxy: 34 ng/mL (ref 30–100)

## 2021-01-24 ENCOUNTER — Ambulatory Visit
Admission: RE | Admit: 2021-01-24 | Discharge: 2021-01-24 | Disposition: A | Discharge status: Home / Self Care | Payer: Medicare HMO | Source: Ambulatory Visit | Attending: Orthopedic Surgery | Admitting: Orthopedic Surgery

## 2021-01-24 ENCOUNTER — Other Ambulatory Visit: Payer: Self-pay

## 2021-01-24 DIAGNOSIS — M545 Low back pain, unspecified: Secondary | ICD-10-CM

## 2021-09-13 ENCOUNTER — Other Ambulatory Visit: Payer: Self-pay

## 2021-09-13 ENCOUNTER — Ambulatory Visit (HOSPITAL_COMMUNITY)
Admission: EM | Admit: 2021-09-13 | Discharge: 2021-09-13 | Disposition: A | Discharge status: Home / Self Care | Payer: Medicare HMO | Attending: Emergency Medicine | Admitting: Emergency Medicine

## 2021-09-13 ENCOUNTER — Encounter (HOSPITAL_COMMUNITY): Payer: Self-pay

## 2021-09-13 DIAGNOSIS — B3731 Acute candidiasis of vulva and vagina: Secondary | ICD-10-CM | POA: Diagnosis not present

## 2021-09-13 DIAGNOSIS — J029 Acute pharyngitis, unspecified: Secondary | ICD-10-CM | POA: Diagnosis not present

## 2021-09-13 MED ORDER — FLUCONAZOLE 150 MG PO TABS: ORAL_TABLET | ORAL | 0 refills | Status: DC

## 2021-09-13 MED ORDER — CLOTRIMAZOLE-BETAMETHASONE 1-0.05 % EX CREA: 1.0000 "application " | TOPICAL_CREAM | Freq: Two times a day (BID) | CUTANEOUS | 0 refills | Status: DC

## 2021-09-13 NOTE — Discharge Instructions (Addendum)
Take the diflucan one pill today.  If you are still having symptoms in 3 days you can take the second pill.  You can apply the Lotrisone cream twice a day as needed for itching.  Try to stay clean and dry.  Avoid tight fitting clothing.  Wear breathable fabrics such as cotton.   You can take Tylenol and/or Ibuprofen as needed for fever reduction and pain relief.   For sore throat: try warm salt water gargles, cepacol lozenges, throat spray, warm tea or water with lemon/honey, popsicles or ice   It is important to stay hydrated: drink plenty of fluids (water, gatorade/powerade/pedialyte, juices, or teas) to keep your throat moisturized and help further relieve irritation/discomfort.   Return or go to the Emergency Department if symptoms worsen or do not improve in the next few days.

## 2021-09-13 NOTE — ED Triage Notes (Signed)
Pt presents with sore throat and bilateral ear pain X 3 days; pt also complains of ongoing vaginal itching for over a week.

## 2021-09-13 NOTE — ED Provider Notes (Signed)
[facility]    CSN: [id number] Arrival date & time: 09/13/21  1141      History   Chief Complaint Chief Complaint  Patient presents with   Vaginal Itching   Sore Throat    HPI Cheryl Gill is a 70 y.o. female.   Patient here for evaluation of vaginal itching that has been ongoing for the past month.  Reports itching is intermittent and that it occasionally "feels like something is crawling down there".  Reports using OTC anti-itch cream with some relief.  Reports some vaginal discharge and odor but is unable to describe them.  Also reports bilateral ear pain and sore throat that has been ongoing for the past 3 days.  Denies any recent sick contacts.  Has not taken any OTC medications or treatments.  Denies any trauma, injury, or other precipitating event.  Denies any specific alleviating or aggravating factors.  Denies any fevers, chest pain, shortness of breath, N/V/D, numbness, tingling, weakness, abdominal pain, or headaches.    The history is provided by the patient.  Vaginal Itching  Sore Throat   Past Medical History:  Diagnosis Date   Asthma    CAD (coronary artery disease)    a. 12/2014: NSTEMI with cath showing Ostial LAD ~95%, mLAD 80-90%; EF ~40-45%, LAD disease compromised major D1 branch. CABG recommended and performed with LIMA-LAD and SVG-D1.    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   LIMA-LAD, SVG-D1   Seasonal allergies     Patient Active Problem List   Diagnosis Date Noted   Acute gout 06/19/2018   Left leg pain 06/18/2018   CKD (chronic kidney disease),  stage III (HCC) 06/18/2018   Joint pain 06/18/2018   Effusion of left knee    Colitis, acute 03/05/2015   Colitis 03/05/2015   Lower abdominal pain    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   Coronary artery disease involving native coronary artery without angina pectoris 12/19/2014   NSTEMI (non-ST elevated myocardial infarction) (HCC) 12/18/2014   Obesity, Class I, BMI 30-34.9 12/18/2014   Metabolic syndrome: DM, HTN, Obesity as well as HLD 12/18/2014   Frequency of urination 07/17/2014   Nocturia more than twice per night 07/17/2014   Abdominal pain 01/28/2014   Asthma, chronic 01/28/2014   Hyperlipidemia with target LDL less than 70 01/28/2014   Leukocytosis 01/28/2014   Normocytic anemia 01/18/2013   Anaphylactic reaction, due to adverse effect of correct medicinal substance properly administered 01/16/2013   Diabetes mellitus type 2, uncontrolled 01/16/2013   Seasonal allergies     Past Surgical History:  Procedure Laterality Date   ABDOMINAL HYSTERECTOMY     CARDIAC SURGERY     CORONARY ARTERY BYPASS GRAFT N/A 12/21/2014   Procedure: CORONARY ARTERY BYPASS GRAFTING (CABG) TIMES TWO USING LEFT INTERNAL MAMMARY AND RIGHT SAPHENOUS LEG VEIN HARVESTED ENDOSCOPICALLY;  Surgeon: Loreli Slot, MD;  Location: [facility] OR;  Service: Open Heart Surgery;  Laterality: N/A;   LEFT HEART CATHETERIZATION WITH CORONARY ANGIOGRAM N/A 12/18/2014   Procedure: LEFT HEART  CATHETERIZATION WITH CORONARY ANGIOGRAM;  Surgeon: Peter M Swaziland, MD;  Location: [facility] CATH LAB;  Service: Cardiovascular;  Laterality: N/A;   NM MYOCAR PERF WALL MOTION  04/09/2010   Abnormal study - appears to be a small area of apical infarction. Reversible ischemia is not seen.   TEE WITHOUT CARDIOVERSION N/A 12/21/2014   Procedure: TRANSESOPHAGEAL ECHOCARDIOGRAM (TEE);  Surgeon: Loreli Slot, MD;  Location: [facility] OR;  Service: Open Heart Surgery;  Laterality: N/A;   TONSILLECTOMY     TRANSTHORACIC ECHOCARDIOGRAM  12/2014   LVEF 60-65%,  normal wall thickness, normal wall motion, diastolic dysfunction, indeterminate LV filling pressure, normal LA size.    OB History     Gravida  5   Para  4   Term  4   Preterm      AB  1   Living  4      SAB  1   IAB      Ectopic      Multiple      Live Births  4            Home Medications    Prior to Admission medications   Medication Sig Start Date End Date Taking? Authorizing Provider  clotrimazole-betamethasone (LOTRISONE) cream Apply 1 application topically 2 (two) times daily. 09/13/21  Yes Ivette Loyal, NP  fluconazole (DIFLUCAN) 150 MG tablet Take one pill today.  Take the second pill in 3 days if you are are still having symptoms. 09/13/21  Yes Ivette Loyal, NP  albuterol (PROVENTIL HFA;VENTOLIN HFA) 108 (90 Base) MCG/ACT inhaler Inhale 2 puffs into the lungs every 4 (four) hours as needed. For shortness of breath or wheezing 01/18/19   Domenick Gong, MD  ALLOPURINOL PO Take by mouth.    [provider]  aspirin EC 81 MG tablet Take 81 mg by mouth daily.    [provider]  cetirizine (ZYRTEC) 10 MG tablet Take 10 mg by mouth daily.    [provider]  colchicine 0.6 MG tablet Take 0.5 tablets (0.3 mg total) by mouth 2 (two) times daily. 06/20/18   Danford, Earl Lites, MD  cycloSPORINE (RESTASIS) 0.05 % ophthalmic emulsion Place 1 drop into both eyes 2 (two) times daily. 10/29/17   [provider]  diclofenac sodium (VOLTAREN) 1 % GEL Apply 2 g topically 4 (four) times daily. 03/24/19   Georgetta Haber, NP  fluticasone (FLONASE) 50 MCG/ACT nasal spray Place 2 sprays into both nostrils as needed for allergies or rhinitis.    [provider]  Fluticasone-Salmeterol (ADVAIR) 100-50 MCG/DOSE AEPB Inhale 1 puff into the lungs 2 (two) times daily.    [provider]  glipiZIDE (GLUCOTROL) 10 MG tablet Take 10 mg by mouth 2 (two) times daily before a meal.    [provider]   hydrochlorothiazide (HYDRODIURIL) 25 MG tablet Take 25 mg by mouth daily as needed (swelling).    [provider]  ipratropium (ATROVENT) 0.03 % nasal spray Place 2 sprays into both nostrils 2 (two) times daily. 01/20/20   Bing Neighbors, FNP  LANTUS SOLOSTAR 100 UNIT/ML Solostar Pen Inject 60 Units into the skin at bedtime.  03/15/18   [provider]  metoprolol tartrate (LOPRESSOR) 25 MG tablet TAKE 1 TABLET BY MOUTH TWICE DAILY 03/15/18   Marykay Lex, MD  morphine (MSIR) 15 MG tablet Take 0.5 tablets (7.5 mg total) by mouth every 4 (four) hours as needed for severe pain. 12/09/18  Melene Plan, DO  Multiple Vitamins-Calcium (MULTI-DAY/CALCIUM/EXTRA IRON) TABS Take 1 tablet by mouth daily. 05/31/18   [provider]  predniSONE (DELTASONE) 10 MG tablet Begin with 6 tabs on day 1 and 2, 5 tab on day 3 and 4, 4 tab on day 5 and 6, 3 tab on day 7and 8, 2 tab on day 9 and 10, 1 tab on day 11 and12-take with food 09/29/20   Wieters, Hallie C, PA-C  simvastatin (ZOCOR) 40 MG tablet Take 40 mg by mouth at bedtime. 02/01/18   [provider]  Spacer/Aero-Holding Chambers (AEROCHAMBER PLUS) inhaler Use as instructed 01/18/19   Domenick Gong, MD    Family History Family History  Problem Relation Age of Onset   Pancreatic cancer Mother    CAD Mother        Open heart surgery 60s-70s   Stroke Father    Heart disease Sister        Unclear details    Social History Social History   Tobacco Use   Smoking status: Never   Smokeless tobacco: Never  Vaping Use   Vaping Use: Never used  Substance Use Topics   Alcohol use: No   Drug use: No     Allergies   Ace inhibitors, Lisinopril, Augmentin [amoxicillin-pot clavulanate], Mushroom extract complex, and Tetanus toxoids   Review of Systems Review of Systems  Constitutional:  Negative for fever.  HENT:  Positive for ear pain, sore throat and trouble swallowing.   Genitourinary:  Positive for vaginal  discharge. Negative for dysuria, hematuria and urgency.  All other systems reviewed and are negative.   Physical Exam Triage Vital Signs ED Triage Vitals  Enc Vitals Group     BP 09/13/21 1326 (!) 160/82     Pulse Rate 09/13/21 1326 72     Resp 09/13/21 1326 17     Temp 09/13/21 1326 98.8 F (37.1 C)     Temp Source 09/13/21 1326 Oral     SpO2 09/13/21 1326 95 %     Weight --      Height --      Head Circumference --      Peak Flow --      Pain Score 09/13/21 1327 5     Pain Loc --      Pain Edu? --      Excl. in GC? --    No data found.  Updated Vital Signs BP (!) 160/82 (BP Location: Right Arm)   Pulse 72   Temp 98.8 F (37.1 C) (Oral)   Resp 17   SpO2 95%   Visual Acuity Right Eye Distance:   Left Eye Distance:   Bilateral Distance:    Right Eye Near:   Left Eye Near:    Bilateral Near:     Physical Exam Vitals and nursing note reviewed. Chaperone present: declines.  Constitutional:      General: She is not in acute distress.    Appearance: Normal appearance. She is not ill-appearing, toxic-appearing or diaphoretic.  HENT:     Head: Normocephalic and atraumatic.     Right Ear: Tympanic membrane and ear canal normal. No middle ear effusion. Tympanic membrane is not erythematous.     Left Ear:  No middle ear effusion. Tympanic membrane is not erythematous.     Nose: No congestion.     Mouth/Throat:     Pharynx: No pharyngeal swelling or posterior oropharyngeal erythema.     Tonsils: No tonsillar exudate or tonsillar  abscesses. 0 on the right. 0 on the left.  Eyes:     Conjunctiva/sclera: Conjunctivae normal.  Cardiovascular:     Rate and Rhythm: Normal rate.     Pulses: Normal pulses.  Pulmonary:     Effort: Pulmonary effort is normal.  Abdominal:     General: Abdomen is flat.  Musculoskeletal:        General: Normal range of motion.     Cervical back: Normal range of motion.  Skin:    General: Skin is warm and dry.  Neurological:     General:  No focal deficit present.     Mental Status: She is alert and oriented to person, place, and time.  Psychiatric:        Mood and Affect: Mood normal.     UC Treatments / Results  Labs (all labs ordered are listed, but only abnormal results are displayed) Labs Reviewed - No data to display  EKG   Radiology No results found.  Procedures Procedures (including critical care time)  Medications Ordered in UC Medications - No data to display  Initial Impression / Assessment and Plan / UC Course  I have reviewed the triage vital signs and the nursing notes.  Pertinent labs & imaging results that were available during my care of the patient were reviewed by me and considered in my medical decision making (see chart for details).    Assessment negative for red flags or concerns.   Yeast vaginitis. Diflucan 1 pill today and repeat in 3 days if still having symptoms Lotrisone cream twice a day as needed Recommend staying clean and dry, avoiding tight fitting clothing, and wearing breathable fabrics  Viral pharyngitis Tylenol and or ibuprofen as needed Conservative symptom management as described in discharge instructions Follow-up with primary care provider for reevaluation of soon as possible Final Clinical Impressions(s) / UC Diagnoses   Final diagnoses:  Yeast vaginitis  Viral pharyngitis     Discharge Instructions      Take the diflucan one pill today.  If you are still having symptoms in 3 days you can take the second pill.  You can apply the Lotrisone cream twice a day as needed for itching.  Try to stay clean and dry.  Avoid tight fitting clothing.  Wear breathable fabrics such as cotton.   You can take Tylenol and/or Ibuprofen as needed for fever reduction and pain relief.   For sore throat: try warm salt water gargles, cepacol lozenges, throat spray, warm tea or water with lemon/honey, popsicles or ice   It is important to stay hydrated: drink plenty of fluids  (water, gatorade/powerade/pedialyte, juices, or teas) to keep your throat moisturized and help further relieve irritation/discomfort.   Return or go to the Emergency Department if symptoms worsen or do not improve in the next few days.        ED Prescriptions     Medication Sig Dispense Auth. Provider   fluconazole (DIFLUCAN) 150 MG tablet Take one pill today.  Take the second pill in 3 days if you are are still having symptoms. 2 tablet Ivette Loyal, NP   clotrimazole-betamethasone (LOTRISONE) cream Apply 1 application topically 2 (two) times daily. 30 g Ivette Loyal, NP      PDMP not reviewed this encounter.   Ivette Loyal, NP 09/13/21 1348

## 2021-09-18 ENCOUNTER — Other Ambulatory Visit: Payer: Self-pay | Admitting: Internal Medicine

## 2021-09-18 DIAGNOSIS — Z1231 Encounter for screening mammogram for malignant neoplasm of breast: Secondary | ICD-10-CM

## 2021-12-09 ENCOUNTER — Ambulatory Visit
Admission: RE | Admit: 2021-12-09 | Discharge: 2021-12-09 | Disposition: A | Discharge status: Home / Self Care | Payer: Medicare HMO | Source: Ambulatory Visit | Attending: Internal Medicine | Admitting: Internal Medicine

## 2021-12-09 DIAGNOSIS — Z1231 Encounter for screening mammogram for malignant neoplasm of breast: Secondary | ICD-10-CM

## 2022-02-08 ENCOUNTER — Ambulatory Visit (HOSPITAL_COMMUNITY)
Admission: EM | Admit: 2022-02-08 | Discharge: 2022-02-08 | Disposition: A | Discharge status: Home / Self Care | Payer: Medicare HMO | Attending: Family Medicine | Admitting: Family Medicine

## 2022-02-08 ENCOUNTER — Other Ambulatory Visit: Payer: Self-pay

## 2022-02-08 ENCOUNTER — Encounter (HOSPITAL_COMMUNITY): Payer: Self-pay

## 2022-02-08 DIAGNOSIS — R1032 Left lower quadrant pain: Secondary | ICD-10-CM

## 2022-02-08 LAB — POCT URINALYSIS DIPSTICK, ED / UC
Bilirubin Urine: NEGATIVE
Glucose, UA: NEGATIVE mg/dL
Hgb urine dipstick: NEGATIVE
Ketones, ur: NEGATIVE mg/dL
Nitrite: NEGATIVE
Protein, ur: NEGATIVE mg/dL
Specific Gravity, Urine: 1.02 (ref 1.005–1.030)
Urobilinogen, UA: 0.2 mg/dL (ref 0.0–1.0)
pH: 5 (ref 5.0–8.0)

## 2022-02-08 MED ORDER — LEVOFLOXACIN 250 MG PO TABS: 250.0000 mg | ORAL_TABLET | Freq: Every day | ORAL | 0 refills | Status: DC

## 2022-02-08 NOTE — ED Triage Notes (Signed)
Pt presents to the office for left side pain radiating down to her leg that started last night.

## 2022-02-08 NOTE — Discharge Instructions (Addendum)
Your urinalysis did show a few white blood cells, but I do wonder if you actually have diverticulitis, with your left lower abdominal tenderness.  Urine culture is sent to the see if you may be do have a bladder infection.  Staff will call you if it looks like you need a different antibiotic than the one I sent  Take Levaquin 250 mg, 1 tab once daily for 7 days.  The low back pain into your leg, I do think is more musculoskeletal in origin.  Take Tylenol 500 mg, 2 every 6 hours as needed for pain.  You can also use a heating pad on the sore area

## 2022-02-08 NOTE — ED Provider Notes (Signed)
[place]    CSN: [id number] Arrival date & time: 02/08/22  1012      History   Chief Complaint Chief Complaint  Patient presents with   Flank Pain    HPI Cheryl Gill is a 71 y.o. female.    Flank Pain  Here with a 4 to 5-day history of left buttock pain radiating into her left posterior proximal thigh.  She also has had some urinary frequency, but no dysuria or hematuria.  She has had no fever or chills, nor has she had any vomiting or diarrhea.  She does not note a history of nausea.  Last bowel movement was yesterday morning and she usually has 1-3 times a day.  History significant for diabetes, which is well controlled lately.  She notes that sugar of 74 this morning and she was able to eat after that.  She also has a history of coronary artery disease, CKD (with last creatinine in epic 1.4 with an EGFR of 43), and hypertension.  Past Medical History:  Diagnosis Date   Asthma    CAD (coronary artery disease)    a. 12/2014: NSTEMI with cath showing Ostial LAD ~95%, mLAD 80-90%; EF ~40-45%, LAD disease compromised major D1 branch. CABG recommended and performed with LIMA-LAD and SVG-D1.    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   LIMA-LAD, SVG-D1   Seasonal allergies     Patient Active Problem List   Diagnosis Date Noted   Acute gout 06/19/2018   Left leg pain 06/18/2018   CKD (chronic kidney disease), stage III ([place]) 06/18/2018   Joint pain 06/18/2018   Effusion of left knee    Colitis, acute 03/05/2015   Colitis 03/05/2015   Lower abdominal pain    S/P CABG x 2  12/21/2014   Coronary artery disease involving native coronary artery without angina pectoris 12/19/2014   NSTEMI (non-ST elevated myocardial infarction) ([place]) 12/18/2014   Obesity, Class I, BMI 30-34.9 99/83/3825   Metabolic syndrome: DM, HTN, Obesity as well as HLD 12/18/2014   Frequency of urination 07/17/2014   Nocturia more than twice per night 07/17/2014   Abdominal pain 01/28/2014   Asthma, chronic 01/28/2014   Hyperlipidemia with target LDL less than 70 01/28/2014   Leukocytosis 01/28/2014   Normocytic anemia 01/18/2013   Anaphylactic reaction, due to adverse effect of correct medicinal substance properly administered 01/16/2013   Diabetes mellitus type 2, uncontrolled 01/16/2013   Seasonal allergies     Past Surgical History:  Procedure Laterality Date   ABDOMINAL HYSTERECTOMY     CARDIAC SURGERY     CORONARY ARTERY BYPASS GRAFT N/A 12/21/2014   Procedure: CORONARY ARTERY BYPASS GRAFTING (CABG) TIMES TWO USING LEFT INTERNAL MAMMARY AND RIGHT SAPHENOUS LEG VEIN HARVESTED ENDOSCOPICALLY;  Surgeon: Melrose Nakayama, MD;  Location: [facility];  Service: Open Heart Surgery;  Laterality: N/A;   LEFT HEART CATHETERIZATION WITH CORONARY ANGIOGRAM N/A 12/18/2014   Procedure: LEFT HEART CATHETERIZATION WITH CORONARY ANGIOGRAM;  Surgeon: Peter M Martinique, MD;  Location: [facility] CATH LAB;  Service: Cardiovascular;  Laterality: N/A;   NM MYOCAR PERF WALL MOTION  04/09/2010   Abnormal study - appears to be a small area of apical infarction. Reversible ischemia is not seen.   TEE WITHOUT CARDIOVERSION N/A 12/21/2014   Procedure: TRANSESOPHAGEAL ECHOCARDIOGRAM (TEE);  Surgeon: Melrose Nakayama, MD;  Location: [facility];  Service: Open Heart Surgery;  Laterality: N/A;   TONSILLECTOMY     TRANSTHORACIC ECHOCARDIOGRAM  12/2014   LVEF 60-65%, normal wall thickness, normal wall motion, diastolic dysfunction, indeterminate LV filling pressure, normal LA size.    OB History     Gravida  5   Para  4   Term   4   Preterm      AB  1   Living  4      SAB  1   IAB      Ectopic      Multiple      Live Births  4            Home Medications    Prior to Admission medications   Medication Sig Start Date End Date Taking? Authorizing Provider  levofloxacin (LEVAQUIN) 250 MG tablet Take 1 tablet (250 mg total) by mouth daily. 02/08/22  Yes Javarri Segal, Gwenlyn Perking, MD  albuterol (PROVENTIL HFA;VENTOLIN HFA) 108 (90 Base) MCG/ACT inhaler Inhale 2 puffs into the lungs every 4 (four) hours as needed. For shortness of breath or wheezing 01/18/19   Melynda Ripple, MD  ALLOPURINOL PO Take by mouth.    [provider]  aspirin EC 81 MG tablet Take 81 mg by mouth daily.    [provider]  cetirizine (ZYRTEC) 10 MG tablet Take 10 mg by mouth daily.    [provider]  clotrimazole-betamethasone (LOTRISONE) cream Apply 1 application topically 2 (two) times daily. 09/13/21   Pearson Forster, NP  colchicine 0.6 MG tablet Take 0.5 tablets (0.3 mg total) by mouth 2 (two) times daily. 06/20/18   Danford, Suann Larry, MD  cycloSPORINE (RESTASIS) 0.05 % ophthalmic emulsion Place 1 drop into both eyes 2 (two) times daily. 10/29/17   [provider]  diclofenac sodium (VOLTAREN) 1 % GEL Apply 2 g topically 4 (four) times daily. 03/24/19   Zigmund Gottron, NP  fluconazole (DIFLUCAN) 150 MG tablet Take one pill today.  Take the second pill in 3 days if you are are still having symptoms. 09/13/21   Pearson Forster, NP  fluticasone (FLONASE) 50 MCG/ACT nasal spray Place 2 sprays into both nostrils as needed for allergies or rhinitis.    [provider]  Fluticasone-Salmeterol (ADVAIR) 100-50 MCG/DOSE AEPB Inhale 1 puff into the lungs 2 (two) times daily.    [provider]  glipiZIDE (GLUCOTROL) 10 MG tablet Take 10 mg by mouth 2 (two) times daily before a meal.    [provider]  hydrochlorothiazide (HYDRODIURIL) 25 MG tablet Take 25 mg by mouth daily as  needed (swelling).    [provider]  ipratropium (ATROVENT) 0.03 % nasal spray Place 2 sprays into both nostrils 2 (two) times daily. 01/20/20   Scot Jun, FNP  LANTUS SOLOSTAR 100 UNIT/ML Solostar Pen Inject 60 Units into the skin at bedtime.  03/15/18   [provider]  metoprolol tartrate (LOPRESSOR) 25 MG tablet TAKE 1 TABLET BY MOUTH TWICE DAILY 03/15/18   Leonie Man, MD  morphine (MSIR) 15 MG tablet Take 0.5 tablets (7.5 mg total) by mouth every 4 (four) hours as needed  for severe pain. 12/09/18   Deno Etienne, DO  Multiple Vitamins-Calcium (MULTI-DAY/CALCIUM/EXTRA IRON) TABS Take 1 tablet by mouth daily. 05/31/18   [provider]  simvastatin (ZOCOR) 40 MG tablet Take 40 mg by mouth at bedtime. 02/01/18   [provider]  Spacer/Aero-Holding Chambers (AEROCHAMBER PLUS) inhaler Use as instructed 01/18/19   Melynda Ripple, MD    Family History Family History  Problem Relation Age of Onset   Pancreatic cancer Mother    CAD Mother        Open heart surgery 60s-70s   Stroke Father    Heart disease Sister        Unclear details    Social History Social History   Tobacco Use   Smoking status: Never   Smokeless tobacco: Never  Vaping Use   Vaping Use: Never used  Substance Use Topics   Alcohol use: No   Drug use: No     Allergies   Ace inhibitors, Lisinopril, Augmentin [amoxicillin-pot clavulanate], Mushroom extract complex, and Tetanus toxoids   Review of Systems Review of Systems  Genitourinary:  Positive for flank pain.    Physical Exam Triage Vital Signs ED Triage Vitals  Enc Vitals Group     BP 02/08/22 1031 (!) 165/91     Pulse Rate 02/08/22 1031 68     Resp 02/08/22 1031 18     Temp 02/08/22 1031 98.3 F (36.8 C)     Temp src --      SpO2 02/08/22 1031 100 %     Weight --      Height --      Head Circumference --      Peak Flow --      Pain Score 02/08/22 1032 8     Pain Loc --      Pain Edu? --       Excl. in [place]? --    No data found.  Updated Vital Signs BP (!) 165/91 (BP Location: Left Arm)    Pulse 68    Temp 98.3 F (36.8 C)    Resp 18    SpO2 100%   Visual Acuity Right Eye Distance:   Left Eye Distance:   Bilateral Distance:    Right Eye Near:   Left Eye Near:    Bilateral Near:     Physical Exam Vitals reviewed.  Constitutional:      General: She is not in acute distress.    Appearance: She is not toxic-appearing.  HENT:     Mouth/Throat:     Mouth: Mucous membranes are moist.  Eyes:     Extraocular Movements: Extraocular movements intact.     Pupils: Pupils are equal, round, and reactive to light.  Cardiovascular:     Rate and Rhythm: Normal rate and regular rhythm.     Heart sounds: No murmur heard. Pulmonary:     Effort: Pulmonary effort is normal.     Breath sounds: No wheezing or rhonchi.  Abdominal:     General: There is no distension.     Palpations: Abdomen is soft. There is no mass.     Tenderness: There is abdominal tenderness (LLQ).  Musculoskeletal:     Cervical back: Neck supple.  Lymphadenopathy:     Cervical: No cervical adenopathy.  Skin:    Capillary Refill: Capillary refill takes less than 2 seconds.     Coloration: Skin is not jaundiced or pale.  Neurological:     General: No focal deficit present.  Mental Status: She is alert and oriented to person, place, and time.  Psychiatric:        Behavior: Behavior normal.     UC Treatments / Results  Labs (all labs ordered are listed, but only abnormal results are displayed) Labs Reviewed  POCT URINALYSIS DIPSTICK, ED / UC - Abnormal; Notable for the following components:      Result Value   Leukocytes,Ua SMALL (*)    All other components within normal limits  URINE CULTURE    EKG   Radiology No results found.  Procedures Procedures (including critical care time)  Medications Ordered in UC Medications - No data to display  Initial Impression / Assessment and Plan / UC  Course  I have reviewed the triage vital signs and the nursing notes.  Pertinent labs & imaging results that were available during my care of the patient were reviewed by me and considered in my medical decision making (see chart for details).     UA shows a small amount of white cells.  No sugar and no blood.  We will treat for possible diverticulitis, with Levaquin(per UTD, no adjustment necessary for 250 mg).  Urine culture is sent with her pyuria and urinary frequency.  She will follow-up with her primary doctor in the next week or so  I do feel the low back pain/buttock pain is most likely musculoskeletal in origin. Final Clinical Impressions(s) / UC Diagnoses   Final diagnoses:  LLQ pain     Discharge Instructions      Your urinalysis did show a few white blood cells, but I do wonder if you actually have diverticulitis, with your left lower abdominal tenderness.  Urine culture is sent to the see if you may be do have a bladder infection.  Staff will call you if it looks like you need a different antibiotic than the one I sent  Take Levaquin 250 mg, 1 tab once daily for 7 days.  The low back pain into your leg, I do think is more musculoskeletal in origin.  Take Tylenol 500 mg, 2 every 6 hours as needed for pain.  You can also use a heating pad on the sore area     ED Prescriptions     Medication Sig Dispense Auth. Provider   levofloxacin (LEVAQUIN) 250 MG tablet Take 1 tablet (250 mg total) by mouth daily. 7 tablet Qualyn Oyervides, Gwenlyn Perking, MD      PDMP not reviewed this encounter.   Barrett Henle, MD 02/08/22 [phone]

## 2022-02-09 LAB — URINE CULTURE: Culture: <10000 — AB

## 2022-03-13 ENCOUNTER — Encounter: Payer: Self-pay | Admitting: Cardiology

## 2022-03-13 ENCOUNTER — Ambulatory Visit: Payer: Medicare HMO | Admitting: Cardiology

## 2022-03-13 VITALS — BP 136/78 | HR 67 | Ht 62.0 in | Wt 195.2 lb

## 2022-03-13 DIAGNOSIS — Z951 Presence of aortocoronary bypass graft: Secondary | ICD-10-CM | POA: Diagnosis not present

## 2022-03-13 DIAGNOSIS — E1169 Type 2 diabetes mellitus with other specified complication: Secondary | ICD-10-CM

## 2022-03-13 DIAGNOSIS — I10 Essential (primary) hypertension: Secondary | ICD-10-CM

## 2022-03-13 DIAGNOSIS — I2510 Atherosclerotic heart disease of native coronary artery without angina pectoris: Secondary | ICD-10-CM

## 2022-03-13 DIAGNOSIS — E8881 Metabolic syndrome: Secondary | ICD-10-CM

## 2022-03-13 DIAGNOSIS — E785 Hyperlipidemia, unspecified: Secondary | ICD-10-CM

## 2022-03-13 DIAGNOSIS — I214 Non-ST elevation (NSTEMI) myocardial infarction: Secondary | ICD-10-CM

## 2022-03-13 DIAGNOSIS — E669 Obesity, unspecified: Secondary | ICD-10-CM

## 2022-03-13 DIAGNOSIS — N1832 Chronic kidney disease, stage 3b: Secondary | ICD-10-CM

## 2022-03-13 NOTE — Progress Notes (Signed)
? ?CLINIC NOTE  ?ATTENDING ATTESTATION ? ?Primary Care Provider: Nolene Ebbs, MD ?Cardiologist: None ?Electrophysiologist: None ? ?Clinic Note: ?Chief Complaint  ?Patient presents with  ? Follow-up  ?  I have been over 2 years  ? Coronary Artery Disease  ?  Doing well.  No angina  ? ?HPI:   ? ?Cheryl Gill is a 71 y.o. female with a PMH notable for NSTEMI-CAD-CABGx2 (January 2016) with CRS of HTN, HLD, DM-2 with CKD 3 who presents today for over 2-year follow-up.  She returns today at the request of Nolene Ebbs, MD. ? ?Non-STEMI January 2016: Cardiac cath revealed severe proximal LAD disease compromising major diagonal => CABG x2 (LIMA-LAD, SVG-D1).  Normal EF. ? ? ?Assessment and Plan:  ? ?H/O NSTEMI (non-ST elevated myocardial infarction) ([place]) ?Just over 7 years out from her  non-STEMI that led to her undergoing CABG.  Thankfully, her EF is preserved.  Myoview suggests a possible small apical infarct versus artifact (breast attenuation).  Regardless, EF is normal.  No active anginal symptoms.   ? ? ?A long time since she was last seen, but seems to be stable. ? ?Hyperlipidemia associated with type 2 diabetes mellitus ([place]) ?HLD - on statin managed by PCP, LDL goal <70 ? ?Labs presumably are being followed by PCP.  Last labs seen were from January 2022-LDL 53.  Well within target.  Unfortunately, I do not have ability to review labs from her PCP.  We will need to get the results. ? ?T2DM - on glipizide and basal insulin managed by PCP, consider starting SGLT2i given CKD & CAD ? ? ? ? ?Coronary artery disease involving native coronary artery without angina pectoris ?CAD with history of NSTEMI s/p CABGx2 January 2016 - doing well without symptoms, most recent Myoview 2020 appears stable. ? ?Doing fairly well on current meds: ?Aspirin 81 mg daily along with Lopressor 25 twice daily ?Simvastatin 40 mg daily ?Not currently on ACE inhibitor or ARB-consider if blood pressure remains borderline. ?Consider  GLP-1 agonist and/or SGLT2 inhibitor for diabetes/CAD management. ? ?S/P CABG x 2 ?Myoview in November 2020 was normal.  This was almost 5 years out from her CABG. ?Evaluation would be in 2025 versus early 2026 ? ?Obesity, Class I, BMI 30-34.9 ?The patient understands the need to lose weight with diet and exercise. We have discussed specific strategies for this. ? ? ?Essential hypertension ?HTN - on metoprolol tartrate and HCTZ managed by PCP, consider starting ACEi/ARB given T2DM ? ?Low threshold of starting ACE inhibitor/ARB.  With heart rate of 67, not much room to titrate up beta-blocker. ? ?Metabolic syndrome: DM, HTN, Obesity as well as HLD ?Individual risk factors discussed separately. ? ?CKD (chronic kidney disease), stage III ([place]) ?Followed by PCP.  Would likely benefit from ACE inhibitor or ARB as long as renal function is somewhat stable.  Also consider SGLT2 inhibitor. ? ? ?I have seen, examined and evaluated the patient along with the Resident Physician in clinic today.  I personally performed my own interview & exanimation.  After reviewing all the available data and chart, we discussed the patients laboratory, study & physical findings as well as symptoms in detail. I agree with HIS findings, examination as well as impression recommendations as per our discussion.   ? ?Attending adjustments int the full clinic note annotated in italics.  ? ? ? ?Glenetta Hew, M.D., M.S. ?Interventional Cardiologist  ? ?Pager # [phone] ?Phone # [phone] ?[address] ?[place], [place] [address] ? ? ?  Thank you for choosing [facility]!! ?  ? ?

## 2022-03-13 NOTE — Progress Notes (Addendum)
? ? ?Primary Care Provider: Nolene Ebbs, MD ?Cardiologist: None ?Electrophysiologist: None ? ?Clinic Note: ?Chief Complaint  ?Patient presents with  ? Follow-up  ?  I have been over 2 years  ? Coronary Artery Disease  ?  Doing well.  No angina  ? ?=================================== ? ?ASSESSMENT/PLAN  ? ?Problem List Items Addressed This Visit   ? ?  ? Cardiology Problems  ? H/O NSTEMI (non-ST elevated myocardial infarction) ([facility]) (Chronic)  ?  Just over 7 years out from her  non-STEMI that led to her undergoing CABG.  Thankfully, her EF is preserved.  Myoview suggests a possible small apical infarct versus artifact (breast attenuation).  Regardless, EF is normal.  No active anginal symptoms.   ? ? ?A long time since she was last seen, but seems to be stable. ?  ?  ? Relevant Orders  ? EKG 12-Lead  ? Coronary artery disease involving native coronary artery without angina pectoris - Primary (Chronic)  ?  CAD with history of NSTEMI s/p CABGx2 January 2016 - doing well without symptoms, most recent Myoview 2020 appears stable. ? ?Doing fairly well on current meds: ?Aspirin 81 mg daily along with Lopressor 25 twice daily ?Simvastatin 40 mg daily ?Not currently on ACE inhibitor or ARB-consider if blood pressure remains borderline. ?Consider GLP-1 agonist and/or SGLT2 inhibitor for diabetes/CAD management. ?  ?  ? Relevant Orders  ? EKG 12-Lead  ? Essential hypertension (Chronic)  ?  HTN - on metoprolol tartrate and HCTZ managed by PCP, consider starting ACEi/ARB given T2DM ? ?Low threshold of starting ACE inhibitor/ARB.  With heart rate of 67, not much room to titrate up beta-blocker. ?  ?  ? Hyperlipidemia associated with type 2 diabetes mellitus (HCC) (Chronic)  ?  HLD - on statin managed by PCP, LDL goal <70 ? ?Labs presumably are being followed by PCP.  Last labs seen were from January 2022-LDL 53.  Well within target.  Unfortunately, I do not have ability to review labs from her PCP.  We will need to get the  results. ? ?T2DM - on glipizide and basal insulin managed by PCP, consider starting SGLT2i given CKD & CAD ? ? ? ?  ?  ?  ? Other  ? Metabolic syndrome: DM, HTN, Obesity as well as HLD (Chronic)  ?  Individual risk factors discussed separately. ?  ?  ? Relevant Orders  ? EKG 12-Lead  ? S/P CABG x 2 (Chronic)  ?  Myoview in November 2020 was normal.  This was almost 5 years out from her CABG. ?Evaluation would be in 2025 versus early 2026 ?  ?  ? Relevant Orders  ? EKG 12-Lead  ? Obesity, Class I, BMI 30-34.9 (Chronic)  ?  The patient understands the need to lose weight with diet and exercise. We have discussed specific strategies for this. ? ?  ?  ? CKD (chronic kidney disease), stage III (HCC) (Chronic)  ?  Followed by PCP.  Would likely benefit from ACE inhibitor or ARB as long as renal function is somewhat stable.  Also consider SGLT2 inhibitor. ?  ?  ? ? ?=================================== ? ?HPI:   ? ?Cheryl Gill is a 71 y.o. female with a PMH notable for NSTEMI-CAD-CABGx2 (January 2016) with CRFs of HTN, HLD, DM-2 with CKD 3 who presents today for over 2-year follow-up.  She returns today at the request of Cheryl Ebbs, MD. ? ?Non-STEMI January 2016: Cardiac cath revealed severe proximal LAD disease compromising major  diagonal => CABG x2 (LIMA-LAD, SVG-D1).  Normal EF. ? ?Cheryl Gill was last seen on October 17, 2019: Doing well.  Noted palpitations when eating chocolate.  Was having issues with allergies-wheezing and coughing.  Otherwise negative cardiac ROS. ?Surveillance Myoview ordered ? ?Recent Hospitalizations:  ?02/08/2022: 4-5-day history of left buttock pain rating to left posterior thigh.  Also noted urinary frequency but no dysuria.  Thought to be possible diverticulitis.  Treated with Levaquin.  UA also sent for cultures. ? ?Reviewed  CV studies:   ? ?The following studies were reviewed today: (if available, images/films reviewed: From Epic Chart or Care Everywhere) ?Myoview  October 25, 2019: No ischemia or infarction.  Small apical defect likely artifact.  LOW RISK.  EF 60 to 65%. ? ? ?Interval History:  ? ?Cheryl Gill returns for routine follow-up. She is doing fairly well. Reports occasional palpitations when drinking coffee/eating chocolate and occasional dizziness when turning her head. Otherwise doing normal household activities without any issues. Not doing any regular exercise, but she does do her shopping and housework.  Notes a little bit fatigue and exercise intolerance ? ?She denies any other active cardiac symptoms: ? ?CV Review of Symptoms (Summary) ?Cardiovascular ROS: no chest pain or dyspnea on exertion ?positive for - palpitations and these are usually fleeting; fatigue/exercise intolerance.  Trivial end of day swelling.. ?negative for - orthopnea, paroxysmal nocturnal dyspnea, rapid heart rate, shortness of breath, or syncope or near syncope, TIA/amaurosis fugax, claudication ? ?REVIEWED OF SYSTEMS  ? ?Review of Systems  ?Constitutional:  Positive for malaise/fatigue (Mildly reduced energy.  This does not have the same stamina-probably because she does not do to much). Negative for chills and fever.  ?HENT:  Negative for congestion and ear discharge.   ?Respiratory:  Negative for cough and sputum production.   ?Genitourinary:  Negative for dysuria.  ?     Flank pain seems to be better  ?Musculoskeletal:  Positive for joint pain and myalgias.  ?Neurological:  Negative for dizziness (Although she did feel dizzy when she went to the urgent care.) and weakness.  ?Psychiatric/Behavioral:  Negative for memory loss (Mild). The patient is not nervous/anxious and does not have insomnia.   ? ?I have reviewed and (if needed) personally updated the patient's problem list, medications, allergies, past medical and surgical history, social and family history.  ? ?PAST MEDICAL HISTORY  ? ?Past Medical History:  ?Diagnosis Date  ? Asthma   ? CAD (coronary artery disease)    ? a. 12/2014: NSTEMI with cath showing Ostial LAD ~95%, mLAD 80-90%; EF ~40-45%, LAD disease compromised major D1 branch. CABG recommended and performed with LIMA-LAD and SVG-D1.   ? CKD (chronic kidney disease), stage III ([place])   ? Colitis   ? Diabetes mellitus type 2, insulin dependent ([place])   ? Essential hypertension   ? Hyperlipidemia with target LDL less than 70 01/28/2014  ? Metabolic syndrome: DM, HTN, Obesity as well as HLD 12/18/2014  ? NSTEMI (non-ST elevated myocardial infarction) ([place]) 12/18/2014  ? Echo 1/6: EF 60-65%, no [facility] WMA, Gr 1 DD  ? Obesity   ? BMI ~35-36  ? 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  ? LIMA-LAD, SVG-D1  ? Seasonal allergies   ? ? ?PAST SURGICAL HISTORY  ? ?Past Surgical History:  ?Procedure Laterality Date  ? ABDOMINAL HYSTERECTOMY    ? CARDIAC SURGERY    ? CORONARY ARTERY BYPASS GRAFT N/A 12/21/2014  ? Procedure: CORONARY ARTERY BYPASS GRAFTING (CABG) TIMES TWO USING  LEFT INTERNAL MAMMARY AND RIGHT SAPHENOUS LEG VEIN HARVESTED ENDOSCOPICALLY;  Surgeon: Melrose Nakayama, MD;  Location: [facility];  Service: Open Heart Surgery;  Laterality: N/A;  ? LEFT HEART CATHETERIZATION WITH CORONARY ANGIOGRAM N/A 12/18/2014  ? Procedure: LEFT HEART CATHETERIZATION WITH CORONARY ANGIOGRAM;  Surgeon: Peter M Martinique, MD;  Location[facility] CATH LAB;  Service: Cardiovascular;  Laterality: N/A;  ? NM MYOCAR PERF WALL MOTION  04/09/2010  ? Abnormal study - appears to be a small area of apical infarction. Reversible ischemia is not seen.  ? NM MYOVIEW LTD  10/2019  ? No ischemia or infarction.  Small apical defect likely artifact.  LOW RISK.  EF 60 to 65%.  ? TEE WITHOUT CARDIOVERSION N/A 12/21/2014  ? Procedure: TRANSESOPHAGEAL ECHOCARDIOGRAM (TEE);  Surgeon: Melrose Nakayama, MD;  Location: [facility];  Service: Open Heart Surgery;  Laterality: N/A;  ? TONSILLECTOMY    ? TRANSTHORACIC ECHOCARDIOGRAM  12/2014  ? LVEF 60-65%, normal wall thickness, normal wall motion, diastolic dysfunction, indeterminate LV filling  pressure, normal LA size.  ? ? ?Immunization History  ?Administered Date(s) Administered  ? Influenza,inj,Quad PF,6+ Mos 12/19/2014  ? PFIZER(Purple Top)SARS-COV-2 Vaccination 02/19/2020, 03/26/2020  ? Pneum

## 2022-03-13 NOTE — Patient Instructions (Signed)
Medication Instructions:  ?Continue same medications ?*If you need a refill on your cardiac medications before your next appointment, please call your pharmacy* ? ? ?Lab Work: ?None ordered ? ? ?Testing/Procedures: ?None ordered ? ? ?Follow-Up: ?At [facility], you and your health needs are our priority.  As part of our continuing mission to provide you with exceptional heart care, we have created designated Provider Care Teams.  These Care Teams include your primary Cardiologist (physician) and Advanced Practice Providers (APPs -  Physician Assistants and Nurse Practitioners) who all work together to provide you with the care you need, when you need it. ? ?We recommend signing up for the patient portal called "MyChart".  Sign up information is provided on this After Visit Summary.  MyChart is used to connect with patients for Virtual Visits (Telemedicine).  Patients are able to view lab/test results, encounter notes, upcoming appointments, etc.  Non-urgent messages can be sent to your provider as well.   ?To learn more about what you can do with MyChart, go to [email or web address].   ? ?Your next appointment:  1 year ?  ? ?The format for your next appointment: Office ? ? ?Provider:  Dr.Harding ? ? ?

## 2022-03-15 ENCOUNTER — Encounter: Payer: Self-pay | Admitting: Cardiology

## 2022-03-15 NOTE — Assessment & Plan Note (Addendum)
HLD - on statin managed by PCP, LDL goal <70 ? ?Labs presumably are being followed by PCP.  Last labs seen were from January 2022-LDL 53.  Well within target.  Unfortunately, I do not have ability to review labs from her PCP.  We will need to get the results. ? ?T2DM - on glipizide and basal insulin managed by PCP, consider starting SGLT2i given CKD & CAD ? ? ? ?

## 2022-03-15 NOTE — Assessment & Plan Note (Signed)
CAD with history of NSTEMI s/p CABGx2 January 2016 - doing well without symptoms, most recent Myoview 2020 appears stable. ? ?Doing fairly well on current meds: ?? Aspirin 81 mg daily along with Lopressor 25 twice daily ?? Simvastatin 40 mg daily ?? Not currently on ACE inhibitor or ARB-consider if blood pressure remains borderline. ?? Consider GLP-1 agonist and/or SGLT2 inhibitor for diabetes/CAD management. ?

## 2022-03-15 NOTE — Assessment & Plan Note (Signed)
HTN - on metoprolol tartrate and HCTZ managed by PCP, consider starting ACEi/ARB given T2DM ? ?Low threshold of starting ACE inhibitor/ARB.  With heart rate of 67, not much room to titrate up beta-blocker. ?

## 2022-03-15 NOTE — Assessment & Plan Note (Signed)
Followed by PCP.  Would likely benefit from ACE inhibitor or ARB as long as renal function is somewhat stable.  Also consider SGLT2 inhibitor. ?

## 2022-03-15 NOTE — Assessment & Plan Note (Addendum)
Myoview in November 2020 was normal.  This was almost 5 years out from her CABG. ?Evaluation would be in 2025 versus early 2026 ?

## 2022-03-15 NOTE — Assessment & Plan Note (Signed)
?   Individual risk factors discussed separately. ?

## 2022-03-15 NOTE — Assessment & Plan Note (Signed)
Just over 7 years out from her  non-STEMI that led to her undergoing CABG.  Thankfully, her EF is preserved.  Myoview suggests a possible small apical infarct versus artifact (breast attenuation).  Regardless, EF is normal.  No active anginal symptoms.   ? ? ?A long time since she was last seen, but seems to be stable. ?

## 2022-03-15 NOTE — Assessment & Plan Note (Signed)
The patient understands the need to lose weight with diet and exercise. We have discussed specific strategies for this.  

## 2022-06-05 DIAGNOSIS — I10 Essential (primary) hypertension: Secondary | ICD-10-CM | POA: Diagnosis not present

## 2022-06-05 DIAGNOSIS — E1321 Other specified diabetes mellitus with diabetic nephropathy: Secondary | ICD-10-CM | POA: Diagnosis not present

## 2022-06-05 DIAGNOSIS — M1A9XX0 Chronic gout, unspecified, without tophus (tophi): Secondary | ICD-10-CM | POA: Diagnosis not present

## 2022-06-05 DIAGNOSIS — J4520 Mild intermittent asthma, uncomplicated: Secondary | ICD-10-CM | POA: Diagnosis not present

## 2022-07-17 ENCOUNTER — Other Ambulatory Visit: Payer: Self-pay | Admitting: Internal Medicine

## 2022-07-17 DIAGNOSIS — M1A9XX0 Chronic gout, unspecified, without tophus (tophi): Secondary | ICD-10-CM | POA: Diagnosis not present

## 2022-07-17 DIAGNOSIS — K219 Gastro-esophageal reflux disease without esophagitis: Secondary | ICD-10-CM | POA: Diagnosis not present

## 2022-07-17 DIAGNOSIS — N390 Urinary tract infection, site not specified: Secondary | ICD-10-CM | POA: Diagnosis not present

## 2022-07-17 DIAGNOSIS — E1321 Other specified diabetes mellitus with diabetic nephropathy: Secondary | ICD-10-CM | POA: Diagnosis not present

## 2022-07-17 DIAGNOSIS — I10 Essential (primary) hypertension: Secondary | ICD-10-CM | POA: Diagnosis not present

## 2022-07-18 LAB — URINE CULTURE
MICRO NUMBER:: 13737731
Result:: NO GROWTH
SPECIMEN QUALITY:: ADEQUATE

## 2022-07-18 LAB — BASIC METABOLIC PANEL WITH GFR
BUN/Creatinine Ratio: 11 (calc) (ref 6–22)
BUN: 15 mg/dL (ref 7–25)
CO2: 25 mmol/L (ref 20–32)
Calcium: 8.9 mg/dL (ref 8.6–10.4)
Chloride: 108 mmol/L (ref 98–110)
Creat: 1.32 mg/dL — ABNORMAL HIGH (ref 0.60–1.00)
Glucose, Bld: 101 mg/dL — ABNORMAL HIGH (ref 65–99)
Potassium: 3.9 mmol/L (ref 3.5–5.3)
Sodium: 142 mmol/L (ref 135–146)
eGFR: 43 mL/min/{1.73_m2} — ABNORMAL LOW (ref >=60)

## 2022-07-18 LAB — EXTRA LAV TOP TUBE

## 2022-07-25 ENCOUNTER — Encounter (HOSPITAL_COMMUNITY): Payer: Self-pay | Admitting: Emergency Medicine

## 2022-07-25 ENCOUNTER — Other Ambulatory Visit: Payer: Self-pay

## 2022-07-25 ENCOUNTER — Ambulatory Visit (HOSPITAL_COMMUNITY)
Admission: EM | Admit: 2022-07-25 | Discharge: 2022-07-25 | Disposition: A | Discharge status: Home / Self Care | Payer: Medicare HMO | Attending: Internal Medicine | Admitting: Internal Medicine

## 2022-07-25 DIAGNOSIS — U071 COVID-19: Secondary | ICD-10-CM | POA: Insufficient documentation

## 2022-07-25 DIAGNOSIS — J069 Acute upper respiratory infection, unspecified: Secondary | ICD-10-CM

## 2022-07-25 LAB — SARS CORONAVIRUS 2 BY RT PCR: SARS Coronavirus 2 by RT PCR: POSITIVE — AB

## 2022-07-25 MED ORDER — FLUTICASONE PROPIONATE 50 MCG/ACT NA SUSP: 1.0000 | Freq: Every day | NASAL | 0 refills | Status: AC

## 2022-07-25 MED ORDER — BENZONATATE 100 MG PO CAPS: 100.0000 mg | ORAL_CAPSULE | Freq: Three times a day (TID) | ORAL | 0 refills | Status: DC | PRN

## 2022-07-25 NOTE — Discharge Instructions (Signed)
As we discussed, it appears that you have a viral upper respiratory infection.  Your COVID test is pending.  We will call if it is positive.  You have just completed antibiotic therapy for a different illness as confirmed by your pharmacy.  I do not think that you need antibiotics at this time especially given that you were recently treated with antibiotics.  This viral illness that should resolve with symptomatic treatment and run its course.  No antibiotics are necessary.  Please follow-up if symptoms persist or worsen.

## 2022-07-25 NOTE — ED Triage Notes (Signed)
Pt here for cough and congestion with fever x 1 week

## 2022-07-25 NOTE — ED Provider Notes (Signed)
Cheryl Gill    CSN: [id number] Arrival date & time: 07/25/22  1008      History   Chief Complaint Chief Complaint  Patient presents with   Cough    HPI Cheryl Gill is a 71 y.o. female.   Patient presents with nasal congestion and cough that has been present for about 6 days.  Patient reports that her grandchildren have had similar symptoms.  She denies any known fevers at home but states that she "felt feverish".  Patient has taken Tylenol for symptoms with minimal improvement.  Denies chest pain, shortness of breath, sore throat, ear pain, nausea, vomiting, diarrhea, abdominal pain.  Denies history of COPD but does report history of asthma.   Cough   Past Medical History:  Diagnosis Date   Asthma    CAD (coronary artery disease)    a. 12/2014: NSTEMI with cath showing Ostial LAD ~95%, mLAD 80-90%; EF ~40-45%, LAD disease compromised major D1 branch. CABG recommended and performed with LIMA-LAD and SVG-D1.    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   LIMA-LAD, SVG-D1   Seasonal allergies     Patient Active Problem List   Diagnosis Date Noted   Acute gout 06/19/2018   Left leg pain 06/18/2018   CKD (chronic kidney disease), stage III ([place]) 06/18/2018   Joint pain 06/18/2018   Effusion of left knee    Colitis, acute 03/05/2015   Colitis 03/05/2015   Lower abdominal pain    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   Coronary artery disease involving native coronary artery without angina pectoris 12/19/2014   H/O NSTEMI (non-ST elevated myocardial infarction) ([place]) 12/18/2014   Obesity,  Class I, BMI 30-34.9 93/23/5573   Metabolic syndrome: DM, HTN, Obesity as well as HLD 12/18/2014   Frequency of urination 07/17/2014   Nocturia more than twice per night 07/17/2014   Abdominal pain 01/28/2014   Asthma, chronic 01/28/2014   Hyperlipidemia associated with type 2 diabetes mellitus ([place]) 01/28/2014   Leukocytosis 01/28/2014   Normocytic anemia 01/18/2013   Anaphylactic reaction, due to adverse effect of correct medicinal substance properly administered 01/16/2013   Essential hypertension 01/16/2013   Diabetes mellitus type 2, uncontrolled 01/16/2013   Seasonal allergies     Past Surgical History:  Procedure Laterality Date   ABDOMINAL HYSTERECTOMY     CARDIAC SURGERY     CORONARY ARTERY BYPASS GRAFT N/A 12/21/2014   Procedure: CORONARY ARTERY BYPASS GRAFTING (CABG) TIMES TWO USING LEFT INTERNAL MAMMARY AND RIGHT SAPHENOUS LEG VEIN HARVESTED ENDOSCOPICALLY;  Surgeon: Melrose Nakayama, MD;  Location: [facility];  Service: Open Heart Surgery;  Laterality: N/A;   LEFT HEART CATHETERIZATION WITH CORONARY ANGIOGRAM N/A 12/18/2014   Procedure: LEFT HEART CATHETERIZATION WITH CORONARY ANGIOGRAM;  Surgeon: Peter M Martinique, MD;  Location: [facility];  Service: Cardiovascular;  Laterality: N/A;   NM MYOCAR PERF WALL MOTION  04/09/2010   Abnormal study - appears to be a small area of apical infarction. Reversible ischemia is not seen.   NM MYOVIEW LTD  10/2019   No ischemia or infarction.  Small apical defect likely artifact.  LOW RISK.  EF 60 to 65%.   TEE WITHOUT CARDIOVERSION N/A 12/21/2014   Procedure: TRANSESOPHAGEAL ECHOCARDIOGRAM (TEE);  Surgeon: Melrose Nakayama, MD;  Location: [facility];  Service: Open Heart Surgery;  Laterality: N/A;   TONSILLECTOMY     TRANSTHORACIC ECHOCARDIOGRAM  12/2014   LVEF 60-65%, normal wall thickness, normal wall motion, diastolic dysfunction, indeterminate LV filling pressure, normal LA size.    OB History     Gravida  5   Para  4   Term   4   Preterm      AB  1   Living  4      SAB  1   IAB      Ectopic      Multiple      Live Births  4            Home Medications    Prior to Admission medications   Medication Sig Start Date End Date Taking? Authorizing Provider  benzonatate (TESSALON) 100 MG capsule Take 1 capsule (100 mg total) by mouth every 8 (eight) hours as needed for cough. 07/25/22  Yes Mound, Michele Rockers, FNP  fluticasone (FLONASE) 50 MCG/ACT nasal spray Place 1 spray into both nostrils daily for 3 days. 07/25/22 07/28/22 Yes [place], Michele Rockers, FNP  albuterol (PROVENTIL HFA;VENTOLIN HFA) 108 (90 Base) MCG/ACT inhaler Inhale 2 puffs into the lungs every 4 (four) hours as needed. For shortness of breath or wheezing 01/18/19   Melynda Ripple, MD  ALLOPURINOL PO Take by mouth.    [provider]  aspirin EC 81 MG tablet Take 81 mg by mouth daily.    [provider]  Blood Glucose Monitoring Suppl (TRUE METRIX AIR GLUCOSE METER) w/Device KIT  12/28/21   [provider]  cetirizine (ZYRTEC) 10 MG tablet Take 10 mg by mouth daily.    [provider]  clotrimazole-betamethasone (LOTRISONE) cream Apply 1 application topically 2 (two) times daily. 09/13/21   Pearson Forster, NP  colchicine 0.6 MG tablet Take 0.5 tablets (0.3 mg total) by mouth 2 (two) times daily. 06/20/18   Danford, Suann Larry, MD  cycloSPORINE (RESTASIS) 0.05 % ophthalmic emulsion Place 1 drop into both eyes 2 (two) times daily. 10/29/17   [provider]  diclofenac sodium (VOLTAREN) 1 % GEL Apply 2 g topically 4 (four) times daily. 03/24/19   Zigmund Gottron, NP  DROPLET PEN NEEDLES 32G X 4 MM MISC  12/28/21   [provider]  glipiZIDE (GLUCOTROL) 10 MG tablet Take 10 mg by mouth 2 (two) times daily before a meal.    [provider]  hydrochlorothiazide (HYDRODIURIL) 25 MG tablet Take 25 mg by mouth daily as needed (swelling).    [provider]  ipratropium (ATROVENT) 0.03 %  nasal spray Place 2 sprays into both nostrils 2 (two) times daily. 01/20/20   Scot Jun, FNP  LANTUS SOLOSTAR 100 UNIT/ML Solostar Pen Inject 60 Units into the skin at bedtime.  03/15/18   [provider]  metoprolol tartrate (LOPRESSOR) 25 MG tablet TAKE 1 TABLET BY MOUTH TWICE DAILY 03/15/18   Leonie Man, MD  Multiple Vitamins-Calcium (MULTI-DAY/CALCIUM/EXTRA IRON) TABS Take 1 tablet by mouth daily. 05/31/18   [provider]  NOREL AD 4-10-325 MG TABS Take 1 tablet by mouth 2 (two) times daily as needed. 01/16/22   [provider]  simvastatin (ZOCOR) 40  MG tablet Take 40 mg by mouth at bedtime. 02/01/18   [provider]  Spacer/Aero-Holding Chambers (AEROCHAMBER PLUS) inhaler Use as instructed 01/18/19   Melynda Ripple, MD    Family History Family History  Problem Relation Age of Onset   Pancreatic cancer Mother    CAD Mother        Open heart surgery 60s-70s   Stroke Father    Heart disease Sister        Unclear details    Social History Social History   Tobacco Use   Smoking status: Never   Smokeless tobacco: Never  Vaping Use   Vaping Use: Never used  Substance Use Topics   Alcohol use: No   Drug use: No     Allergies   Ace inhibitors, Lisinopril, Augmentin [amoxicillin-pot clavulanate], Mushroom extract complex, and Tetanus toxoids   Review of Systems Review of Systems Per HPI  Physical Exam Triage Vital Signs ED Triage Vitals [07/25/22 1102]  Enc Vitals Group     BP (!) 154/78     Pulse Rate 77     Resp 18     Temp 98.2 F (36.8 C)     Temp Source Oral     SpO2 100 %     Weight      Height      Head Circumference      Peak Flow      Pain Score 3     Pain Loc      Pain Edu?      Excl. in [place]?    No data found.  Updated Vital Signs BP (!) 154/78 (BP Location: Left Arm)   Pulse 77   Temp 98.2 F (36.8 C) (Oral)   Resp 18   SpO2 100%   Visual Acuity Right Eye Distance:   Left Eye Distance:    Bilateral Distance:    Right Eye Near:   Left Eye Near:    Bilateral Near:     Physical Exam Constitutional:      General: She is not in acute distress.    Appearance: Normal appearance. She is not toxic-appearing or diaphoretic.  HENT:     Head: Normocephalic and atraumatic.     Right Ear: Tympanic membrane and ear canal normal.     Left Ear: Tympanic membrane and ear canal normal.     Nose: Congestion present.     Mouth/Throat:     Mouth: Mucous membranes are moist.     Pharynx: No posterior oropharyngeal erythema.  Eyes:     Extraocular Movements: Extraocular movements intact.     Conjunctiva/sclera: Conjunctivae normal.     Pupils: Pupils are equal, round, and reactive to light.  Cardiovascular:     Rate and Rhythm: Normal rate and regular rhythm.     Pulses: Normal pulses.     Heart sounds: Normal heart sounds.  Pulmonary:     Effort: Pulmonary effort is normal. No respiratory distress.     Breath sounds: Normal breath sounds. No stridor. No wheezing, rhonchi or rales.  Abdominal:     General: Abdomen is flat. Bowel sounds are normal.     Palpations: Abdomen is soft.  Musculoskeletal:        General: Normal range of motion.     Cervical back: Normal range of motion.  Skin:    General: Skin is warm and dry.  Neurological:     General: No focal deficit present.     Mental Status: She is  alert and oriented to person, place, and time. Mental status is at baseline.  Psychiatric:        Mood and Affect: Mood normal.        Behavior: Behavior normal.      UC Treatments / Results  Labs (all labs ordered are listed, but only abnormal results are displayed) Labs Reviewed  SARS CORONAVIRUS 2 BY RT PCR    EKG   Radiology No results found.  Procedures Procedures (including critical care time)  Medications Ordered in UC Medications - No data to display  Initial Impression / Assessment and Plan / UC Course  I have reviewed the triage vital signs and the  nursing notes.  Pertinent labs & imaging results that were available during my care of the patient were reviewed by me and considered in my medical decision making (see chart for details).     Patient presents with symptoms likely from a viral upper respiratory infection. Differential includes bacterial pneumonia, sinusitis, allergic rhinitis, COVID-19, flu. Do not suspect underlying cardiopulmonary process. Symptoms seem unlikely related to ACS, CHF or COPD exacerbations, pneumonia, pneumothorax. Patient is nontoxic appearing and not in need of emergent medical intervention.  COVID test pending.  Do not think chest imaging is necessary given no adventitious lung sounds on exam.  Recommended symptom control with medications.  Patient sent prescriptions.  Do not think antibiotic therapy is warranted at this time.  Patient also had recent antibiotic therapy for UTI and advised patient of antibiotic stewardship.  Return if symptoms fail to improve in 1-2 weeks or you develop shortness of breath, chest pain, severe headache. Patient states understanding and is agreeable.  Discharged with PCP followup.  Final Clinical Impressions(s) / UC Diagnoses   Final diagnoses:  Viral upper respiratory tract infection with cough     Discharge Instructions      As we discussed, it appears that you have a viral upper respiratory infection.  Your COVID test is pending.  We will call if it is positive.  You have just completed antibiotic therapy for a different illness as confirmed by your pharmacy.  I do not think that you need antibiotics at this time especially given that you were recently treated with antibiotics.  This viral illness that should resolve with symptomatic treatment and run its course.  No antibiotics are necessary.  Please follow-up if symptoms persist or worsen.    ED Prescriptions     Medication Sig Dispense Auth. Provider   fluticasone (FLONASE) 50 MCG/ACT nasal spray Place 1 spray  into both nostrils daily for 3 days. 16 g [place], Hildred Alamin E, [place]   benzonatate (TESSALON) 100 MG capsule Take 1 capsule (100 mg total) by mouth every 8 (eight) hours as needed for cough. 21 capsule [place], Michele Rockers, [place]      PDMP not reviewed this encounter.   Teodora Medici, Cheryl Gill 07/25/22 1143

## 2022-08-11 ENCOUNTER — Telehealth (HOSPITAL_COMMUNITY): Payer: Self-pay | Admitting: Emergency Medicine

## 2022-08-11 NOTE — Telephone Encounter (Signed)
Patient returned call after receiving letter in the mail.  I updated her phone number (it was incorrect in the chart leading to a "not in service" message), reviewed her results, and she states she is doing really well at this time

## 2022-08-18 ENCOUNTER — Other Ambulatory Visit: Payer: Self-pay | Admitting: Internal Medicine

## 2022-08-18 DIAGNOSIS — Z1231 Encounter for screening mammogram for malignant neoplasm of breast: Secondary | ICD-10-CM

## 2022-09-29 ENCOUNTER — Telehealth: Payer: Self-pay | Admitting: Cardiology

## 2022-09-29 NOTE — Telephone Encounter (Signed)
   Pre-operative Risk Assessment    Patient Name: Cheryl Gill  DOB: 08-10-1951 MRN: [id number]      Request for Surgical Clearance    Procedure:   Cataract surgery   Date of Surgery:  Clearance TBD                                 Surgeon:  Dr. Marylynn Pearson  Surgeon's Group or Practice Name:  [facility]  Phone number:  [phone] Fax number:  [phone]   Type of Clearance Requested:    Medical   5. What type of anesthesia will be used?  Press F2 and select the anesthesia to be used for the procedure.  :1}  Type of Anesthesia:  General    Additional requests/questions:    Signed, Foye Clock   09/29/2022, 4:12 PM

## 2022-09-30 NOTE — Telephone Encounter (Signed)
   Patient Name: Cheryl Gill  DOB: 1951-07-05 MRN: [id number]  Primary Cardiologist: None  Chart reviewed as part of pre-operative protocol coverage. Cataract extractions are recognized in guidelines as low risk surgeries that do not typically require specific preoperative testing or holding of blood thinner therapy. Therefore, given past medical history and time since last visit, based on ACC/AHA guidelines, Cheryl Gill would be at acceptable risk for the planned procedure without further cardiovascular testing.   I will route this recommendation to the requesting party via Epic fax function and remove from pre-op pool.  Please call with questions.  Lenna Sciara, NP 09/30/2022, 4:27 PM

## 2022-10-30 NOTE — Telephone Encounter (Signed)
Re-faxed clearance over to fax # provided.

## 2022-10-30 NOTE — Telephone Encounter (Signed)
Office state they didn't received clearance. Please refax information.

## 2022-12-11 ENCOUNTER — Ambulatory Visit
Admission: RE | Admit: 2022-12-11 | Discharge: 2022-12-11 | Disposition: A | Discharge status: Home / Self Care | Payer: Medicare HMO | Source: Ambulatory Visit | Attending: Internal Medicine | Admitting: Internal Medicine

## 2022-12-11 DIAGNOSIS — Z1231 Encounter for screening mammogram for malignant neoplasm of breast: Secondary | ICD-10-CM

## 2023-01-11 ENCOUNTER — Other Ambulatory Visit: Payer: Self-pay | Admitting: Internal Medicine

## 2023-01-12 LAB — CBC
HCT: 35.4 % (ref 35.0–45.0)
Hemoglobin: 12.1 g/dL (ref 11.7–15.5)
MCH: 33.4 pg — ABNORMAL HIGH (ref 27.0–33.0)
MCHC: 34.2 g/dL (ref 32.0–36.0)
MCV: 97.8 fL (ref 80.0–100.0)
MPV: 10.3 fL (ref 7.5–12.5)
Platelets: 253 10*3/uL (ref 140–400)
RBC: 3.62 10*6/uL — ABNORMAL LOW (ref 3.80–5.10)
RDW: 12.8 % (ref 11.0–15.0)
WBC: 12 10*3/uL — ABNORMAL HIGH (ref 3.8–10.8)

## 2023-01-12 LAB — COMPLETE METABOLIC PANEL WITH GFR
AG Ratio: 1.2 (calc) (ref 1.0–2.5)
ALT: 26 U/L (ref 6–29)
AST: 33 U/L (ref 10–35)
Albumin: 3.9 g/dL (ref 3.6–5.1)
Alkaline phosphatase (APISO): 88 U/L (ref 37–153)
BUN/Creatinine Ratio: 17 (calc) (ref 6–22)
BUN: 24 mg/dL (ref 7–25)
CO2: 23 mmol/L (ref 20–32)
Calcium: 9.2 mg/dL (ref 8.6–10.4)
Chloride: 109 mmol/L (ref 98–110)
Creat: 1.42 mg/dL — ABNORMAL HIGH (ref 0.60–1.00)
Globulin: 3.2 g/dL (calc) (ref 1.9–3.7)
Glucose, Bld: 80 mg/dL (ref 65–99)
Potassium: 4.3 mmol/L (ref 3.5–5.3)
Sodium: 143 mmol/L (ref 135–146)
Total Bilirubin: 0.4 mg/dL (ref 0.2–1.2)
Total Protein: 7.1 g/dL (ref 6.1–8.1)
eGFR: 39 mL/min/{1.73_m2} — ABNORMAL LOW (ref >=60)

## 2023-01-12 LAB — LIPID PANEL
Cholesterol: 109 mg/dL (ref <200)
HDL: 41 mg/dL — ABNORMAL LOW (ref >=50)
LDL Cholesterol (Calc): 50 mg/dL (calc)
Non-HDL Cholesterol (Calc): 68 mg/dL (calc) (ref <130)
Total CHOL/HDL Ratio: 2.7 (calc) (ref <5.0)
Triglycerides: 98 mg/dL (ref <150)

## 2023-01-12 LAB — URIC ACID: Uric Acid, Serum: 3.7 mg/dL (ref 2.5–7.0)

## 2023-01-12 LAB — TSH: TSH: 1.59 mIU/L (ref 0.40–4.50)

## 2023-03-04 ENCOUNTER — Ambulatory Visit (HOSPITAL_COMMUNITY)
Admission: EM | Admit: 2023-03-04 | Discharge: 2023-03-04 | Disposition: A | Discharge status: Home / Self Care | Payer: Medicare HMO | Attending: Emergency Medicine | Admitting: Emergency Medicine

## 2023-03-04 ENCOUNTER — Encounter (HOSPITAL_COMMUNITY): Payer: Self-pay | Admitting: *Deleted

## 2023-03-04 DIAGNOSIS — K529 Noninfective gastroenteritis and colitis, unspecified: Secondary | ICD-10-CM | POA: Insufficient documentation

## 2023-03-04 LAB — CBC
HCT: 39.5 % (ref 36.0–46.0)
Hemoglobin: 13.4 g/dL (ref 12.0–15.0)
MCH: 33.3 pg (ref 26.0–34.0)
MCHC: 33.9 g/dL (ref 30.0–36.0)
MCV: 98.3 fL (ref 80.0–100.0)
Platelets: 231 10*3/uL (ref 150–400)
RBC: 4.02 MIL/uL (ref 3.87–5.11)
RDW: 13.2 % (ref 11.5–15.5)
WBC: 15.9 10*3/uL — ABNORMAL HIGH (ref 4.0–10.5)
nRBC: 0 % (ref 0.0–0.2)

## 2023-03-04 LAB — BASIC METABOLIC PANEL
Anion gap: 12 (ref 5–15)
BUN: 28 mg/dL — ABNORMAL HIGH (ref 8–23)
CO2: 23 mmol/L (ref 22–32)
Calcium: 8.8 mg/dL — ABNORMAL LOW (ref 8.9–10.3)
Chloride: 104 mmol/L (ref 98–111)
Creatinine, Ser: 1.74 mg/dL — ABNORMAL HIGH (ref 0.44–1.00)
GFR, Estimated: 31 mL/min — ABNORMAL LOW (ref >60)
Glucose, Bld: 127 mg/dL — ABNORMAL HIGH (ref 70–99)
Potassium: 3.4 mmol/L — ABNORMAL LOW (ref 3.5–5.1)
Sodium: 139 mmol/L (ref 135–145)

## 2023-03-04 MED ORDER — LOPERAMIDE HCL 2 MG PO CAPS: 2.0000 mg | ORAL_CAPSULE | Freq: Four times a day (QID) | ORAL | 0 refills | Status: DC | PRN

## 2023-03-04 MED ORDER — GUAIFENESIN ER 600 MG PO TB12: 1200.0000 mg | ORAL_TABLET | Freq: Two times a day (BID) | ORAL | 0 refills | Status: AC

## 2023-03-04 MED ORDER — PROMETHAZINE-DM 6.25-15 MG/5ML PO SYRP: 5.0000 mL | ORAL_SOLUTION | Freq: Four times a day (QID) | ORAL | 0 refills | Status: AC | PRN

## 2023-03-04 MED ORDER — ONDANSETRON 4 MG PO TBDP: 4.0000 mg | ORAL_TABLET | Freq: Three times a day (TID) | ORAL | 0 refills | Status: DC | PRN

## 2023-03-04 NOTE — ED Triage Notes (Signed)
Pt states she has had bilateral ear pain and headache X 10 days. Yesterday she started with diarrhea and vomiting. She has been taking tylenol as needed.

## 2023-03-04 NOTE — Discharge Instructions (Addendum)
Overall your physical exam is reassuring, suspect you are suffering from viral gastroenteritis.  Attached are some food choices to help relieve diarrhea.  You can take the Imodium as needed for diarrhea, use the Zofran as needed for nausea.  Please push oral fluids, for your sore throat you can do tea with warm honey, warm saline gargles and sleep with a humidifier.  You can also use Tylenol for fever and body discomfort.  Please seek immediate care if you develop syncope, weakness, uncontrolled vomiting or diarrhea, or worsening of symptoms.

## 2023-03-04 NOTE — ED Provider Notes (Signed)
[place]    CSN: [id number] Arrival date & time: 03/04/23  1330      History   Chief Complaint Chief Complaint  Patient presents with   Emesis   Otalgia   Headache   Diarrhea    HPI Cheryl Gill is a 72 y.o. female.   Patient presents to clinic for bilateral ear pain, headache, sore throat ongoing for the past 10 days.  On Saturday she developed nausea, vomiting and diarrhea.  Continued to vomit into Sunday and Monday.  Today she has not vomited, did have 1 episode of diarrhea after eating a sausage biscuit this morning.  Report is her appetite has been normal, has been sticking to mostly liquids like soup, broth and water due to sore throat.  Reports a temp of 99.1 at home, denies fevers or chills.  Does provide childcare for her young granddaughter.   She is diabetic and her last CBG was this morning, it was 86. Was 154 last night, takes insulin.  Denies chest pain, shortness of breath or current abdominal pain.  Denies current nausea.  Denies ear drainage.    The history is provided by the patient.  Emesis Associated symptoms: diarrhea, headaches and sore throat   Associated symptoms: no abdominal pain, no arthralgias, no cough and no fever   Otalgia Associated symptoms: diarrhea, headaches, rhinorrhea, sore throat and vomiting   Associated symptoms: no abdominal pain, no cough and no fever   Headache Associated symptoms: diarrhea, ear pain, sore throat and vomiting   Associated symptoms: no abdominal pain, no back pain, no cough, no fever and no weakness   Diarrhea Associated symptoms: headaches and vomiting   Associated symptoms: no abdominal pain, no arthralgias and no fever     Past Medical History:  Diagnosis Date   Asthma    CAD (coronary artery disease)    a. 12/2014: NSTEMI with cath showing Ostial LAD ~95%, mLAD 80-90%; EF ~40-45%, LAD disease compromised major D1 branch. CABG recommended and performed with LIMA-LAD and SVG-D1.     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   LIMA-LAD, SVG-D1   Seasonal allergies     Patient Active Problem List   Diagnosis Date Noted   Acute gout 06/19/2018   Left leg pain 06/18/2018   CKD (chronic kidney disease), stage III ([place]) 06/18/2018   Joint pain 06/18/2018   Effusion of left knee    Colitis, acute 03/05/2015   Colitis 03/05/2015   Lower abdominal pain    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   Coronary artery disease involving native coronary artery without angina pectoris 12/19/2014   H/O NSTEMI (non-ST elevated myocardial infarction) ([place]) 12/18/2014   Obesity, Class I, BMI 30-34.9 99991111   Metabolic syndrome: DM, HTN, Obesity as well as HLD 12/18/2014   Frequency of urination 07/17/2014   Nocturia more than twice per night 07/17/2014   Abdominal pain 01/28/2014   Asthma, chronic 01/28/2014   Hyperlipidemia associated with type 2 diabetes mellitus ([place]) 01/28/2014   Leukocytosis 01/28/2014   Normocytic  anemia 01/18/2013   Anaphylactic reaction, due to adverse effect of correct medicinal substance properly administered 01/16/2013   Essential hypertension 01/16/2013   Diabetes mellitus type 2, uncontrolled 01/16/2013   Seasonal allergies     Past Surgical History:  Procedure Laterality Date   ABDOMINAL HYSTERECTOMY     CARDIAC SURGERY     CORONARY ARTERY BYPASS GRAFT N/A 12/21/2014   Procedure: CORONARY ARTERY BYPASS GRAFTING (CABG) TIMES TWO USING LEFT INTERNAL MAMMARY AND RIGHT SAPHENOUS LEG VEIN HARVESTED ENDOSCOPICALLY;  Surgeon: Melrose Nakayama, MD;  Location: [facility];  Service: Open Heart  Surgery;  Laterality: N/A;   LEFT HEART CATHETERIZATION WITH CORONARY ANGIOGRAM N/A 12/18/2014   Procedure: LEFT HEART CATHETERIZATION WITH CORONARY ANGIOGRAM;  Surgeon: Peter M Martinique, MD;  Location: [facility] CATH LAB;  Service: Cardiovascular;  Laterality: N/A;   NM MYOCAR PERF WALL MOTION  04/09/2010   Abnormal study - appears to be a small area of apical infarction. Reversible ischemia is not seen.   NM MYOVIEW LTD  10/2019   No ischemia or infarction.  Small apical defect likely artifact.  LOW RISK.  EF 60 to 65%.   TEE WITHOUT CARDIOVERSION N/A 12/21/2014   Procedure: TRANSESOPHAGEAL ECHOCARDIOGRAM (TEE);  Surgeon: Melrose Nakayama, MD;  Location: [place];  Service: Open Heart Surgery;  Laterality: N/A;   TONSILLECTOMY     TRANSTHORACIC ECHOCARDIOGRAM  12/2014   LVEF 60-65%, normal wall thickness, normal wall motion, diastolic dysfunction, indeterminate LV filling pressure, normal LA size.    OB History     Gravida  5   Para  4   Term  4   Preterm      AB  1   Living  4      SAB  1   IAB      Ectopic      Multiple      Live Births  4            Home Medications    Prior to Admission medications   Medication Sig Start Date End Date Taking? Authorizing Provider  ALLOPURINOL PO Take by mouth.   Yes [provider]  aspirin EC 81 MG tablet Take 81 mg by mouth daily.   Yes [provider]  benzonatate (TESSALON) 100 MG capsule Take 1 capsule (100 mg total) by mouth every 8 (eight) hours as needed for cough. 07/25/22  Yes Mound, Hildred Alamin E, FNP  Blood Glucose Monitoring Suppl (TRUE METRIX AIR GLUCOSE METER) w/Device KIT  12/28/21  Yes [provider]  cetirizine (ZYRTEC) 10 MG tablet Take 10 mg by mouth daily.   Yes [provider]  clotrimazole-betamethasone (LOTRISONE) cream Apply 1 application topically 2 (two) times daily. 09/13/21  Yes Pearson Forster, NP  colchicine 0.6 MG tablet Take 0.5 tablets (0.3 mg total) by mouth 2 (two)  times daily. 06/20/18  Yes Danford, Suann Larry, MD  cycloSPORINE (RESTASIS) 0.05 % ophthalmic emulsion Place 1 drop into both eyes 2 (two) times daily. 10/29/17  Yes [provider]  diclofenac sodium (VOLTAREN) 1 % GEL Apply 2 g topically 4 (four) times daily. 03/24/19  Yes Burky, Lanelle Bal B, NP  DROPLET PEN NEEDLES 32G X 4 MM MISC  12/28/21  Yes [provider]  fluticasone (FLONASE) 50 MCG/ACT nasal spray Place 1 spray into both nostrils daily for 3 days. 07/25/22 03/04/23 Yes Mound, Hildred Alamin E, FNP  glipiZIDE (GLUCOTROL) 10 MG tablet Take 10 mg by mouth 2 (two) times daily before a  meal.   Yes [provider]  guaiFENesin (MUCINEX) 600 MG 12 hr tablet Take 2 tablets (1,200 mg total) by mouth 2 (two) times daily for 10 days. 03/04/23 03/14/23 Yes Louretta Shorten, [place] N, FNP  hydrochlorothiazide (HYDRODIURIL) 25 MG tablet Take 25 mg by mouth daily as needed (swelling).   Yes [provider]  ipratropium (ATROVENT) 0.03 % nasal spray Place 2 sprays into both nostrils 2 (two) times daily. 01/20/20  Yes Scot Jun, NP  LANTUS SOLOSTAR 100 UNIT/ML Solostar Pen Inject 60 Units into the skin at bedtime.  03/15/18  Yes [provider]  loperamide (IMODIUM) 2 MG capsule Take 1 capsule (2 mg total) by mouth 4 (four) times daily as needed for diarrhea or loose stools. 03/04/23  Yes Louretta Shorten, [place] N, FNP  metoprolol tartrate (LOPRESSOR) 25 MG tablet TAKE 1 TABLET BY MOUTH TWICE DAILY 03/15/18  Yes Leonie Man, MD  [facility] 5 MG/0.5ML Pen Inject into the skin. 01/20/23  Yes [provider]  Multiple Vitamins-Calcium (MULTI-DAY/CALCIUM/EXTRA IRON) TABS Take 1 tablet by mouth daily. 05/31/18  Yes [provider]  ondansetron (ZOFRAN-ODT) 4 MG disintegrating tablet Take 1 tablet (4 mg total) by mouth every 8 (eight) hours as needed for nausea or vomiting. 03/04/23  Yes Louretta Shorten, [place] N, FNP  promethazine-dextromethorphan (PROMETHAZINE-DM) 6.25-15 MG/5ML  syrup Take 5 mLs by mouth 4 (four) times daily as needed for up to 5 days for cough. 03/04/23 03/09/23 Yes Louretta Shorten, [place] N, FNP  simvastatin (ZOCOR) 40 MG tablet Take 40 mg by mouth at bedtime. 02/01/18  Yes [provider]  Spacer/Aero-Holding Chambers (AEROCHAMBER PLUS) inhaler Use as instructed 01/18/19  Yes Melynda Ripple, MD  TRELEGY ELLIPTA 100-62.5-25 MCG/ACT AEPB Inhale 1 puff into the lungs daily. 01/18/23  Yes [provider]  albuterol (PROVENTIL HFA;VENTOLIN HFA) 108 (90 Base) MCG/ACT inhaler Inhale 2 puffs into the lungs every 4 (four) hours as needed. For shortness of breath or wheezing 01/18/19   Melynda Ripple, MD  NOREL AD 4-10-325 MG TABS Take 1 tablet by mouth 2 (two) times daily as needed. 01/16/22   [provider]    Family History Family History  Problem Relation Age of Onset   Pancreatic cancer Mother    CAD Mother        Open heart surgery 60s-70s   Stroke Father    Heart disease Sister        Unclear details    Social History Social History   Tobacco Use   Smoking status: Never   Smokeless tobacco: Never  Vaping Use   Vaping Use: Never used  Substance Use Topics   Alcohol use: No   Drug use: No     Allergies   Ace inhibitors, Lisinopril, Augmentin [amoxicillin-pot clavulanate], Mushroom extract complex, and Tetanus toxoids   Review of Systems Review of Systems  Constitutional:  Negative for appetite change and fever.  HENT:  Positive for ear pain, rhinorrhea and sore throat. Negative for trouble swallowing and voice change.   Respiratory:  Negative for cough and wheezing.   Cardiovascular:  Negative for chest pain.  Gastrointestinal:  Positive for diarrhea and vomiting. Negative for abdominal pain.  Genitourinary:  Negative for dysuria, frequency and hematuria.  Musculoskeletal:  Negative for arthralgias and back pain.  Neurological:  Positive for headaches. Negative for syncope and weakness.     Physical  Exam Triage Vital Signs ED Triage Vitals  Enc Vitals Group     BP 03/04/23 1458 (!) 145/82  Pulse Rate 03/04/23 1458 84     Resp 03/04/23 1458 20     Temp 03/04/23 1458 99.2 F (37.3 C)     Temp Source 03/04/23 1458 Oral     SpO2 03/04/23 1458 96 %     Weight --      Height --      Head Circumference --      Peak Flow --      Pain Score 03/04/23 1455 9     Pain Loc --      Pain Edu? --      Excl. in [place]? --    No data found.  Updated Vital Signs BP (!) 145/82 (BP Location: Right Arm)   Pulse 84   Temp 99.2 F (37.3 C) (Oral)   Resp 20   SpO2 96%   Visual Acuity Right Eye Distance:   Left Eye Distance:   Bilateral Distance:    Right Eye Near:   Left Eye Near:    Bilateral Near:     Physical Exam Vitals and nursing note reviewed.  Constitutional:      General: She is not in acute distress.    Appearance: Normal appearance. She is well-developed.  HENT:     Head: Normocephalic and atraumatic.     Right Ear: Tympanic membrane, ear canal and external ear normal. There is no impacted cerumen.     Left Ear: Tympanic membrane, ear canal and external ear normal. There is no impacted cerumen.     Nose: Congestion and rhinorrhea present.     Mouth/Throat:     Mouth: Mucous membranes are moist.     Pharynx: Posterior oropharyngeal erythema present.  Eyes:     General: No scleral icterus.       Right eye: No discharge.        Left eye: No discharge.     Extraocular Movements: Extraocular movements intact.     Conjunctiva/sclera: Conjunctivae normal.     Pupils: Pupils are equal, round, and reactive to light.  Cardiovascular:     Rate and Rhythm: Normal rate and regular rhythm.     Heart sounds: Normal heart sounds, S1 normal and S2 normal. No murmur heard. Pulmonary:     Effort: Pulmonary effort is normal. No respiratory distress.     Breath sounds: Normal breath sounds.     Comments: Lungs vesicular posteriorly. Abdominal:     General: Abdomen is flat. Bowel  sounds are normal. There is no distension.     Palpations: Abdomen is soft. There is no mass.     Tenderness: There is no abdominal tenderness. There is no guarding.  Musculoskeletal:        General: No swelling. Normal range of motion.     Cervical back: Normal range of motion and neck supple.  Lymphadenopathy:     Cervical: Cervical adenopathy present.  Skin:    General: Skin is warm and dry.     Capillary Refill: Capillary refill takes less than 2 seconds.  Neurological:     Mental Status: She is alert and oriented to person, place, and time.     GCS: GCS eye subscore is 4. GCS verbal subscore is 5. GCS motor subscore is 6.  Psychiatric:        Mood and Affect: Mood normal.        Behavior: Behavior is cooperative.      UC Treatments / Results  Labs (all labs ordered are listed, but only abnormal results are  displayed) Labs Reviewed  CBC  BASIC METABOLIC PANEL    EKG   Radiology No results found.  Procedures Procedures (including critical care time)  Medications Ordered in UC Medications - No data to display  Initial Impression / Assessment and Plan / UC Course  I have reviewed the triage vital signs and the nursing notes.  Pertinent labs & imaging results that were available during my care of the patient were reviewed by me and considered in my medical decision making (see chart for details).  Vitals in triage reviewed, patient is hemodynamically stable.  Ongoing diarrhea, emesis, sore throat, headache and nasal congestion, suspect gastroenteritis versus viral illness.  Abdomen nontender to palpation, afebrile and without tachycardia, low concern for dehydration or acute abdomen.  Will check CBC and CMP for gross electrolyte abnormalities or severe white count elevations that would warrant further evaluation.  Lungs vesicular, cough absent, tonsils without exudate or discharge, low concern for bacterial etiology at this point.  Discussed symptomatic management at  home.  Advised to push oral fluids.  Strict emergency precautions given, patient verbalized understanding.  No questions with treatment plan.     Final Clinical Impressions(s) / UC Diagnoses   Final diagnoses:  Gastroenteritis     Discharge Instructions      Overall your physical exam is reassuring, suspect you are suffering from viral gastroenteritis.  Attached are some food choices to help relieve diarrhea.  You can take the Imodium as needed for diarrhea, use the Zofran as needed for nausea.  Please push oral fluids, for your sore throat you can do tea with warm honey, warm saline gargles and sleep with a humidifier.  You can also use Tylenol for fever and body discomfort.  Please seek immediate care if you develop syncope, weakness, uncontrolled vomiting or diarrhea, or worsening of symptoms.       ED Prescriptions     Medication Sig Dispense Auth. Provider   loperamide (IMODIUM) 2 MG capsule Take 1 capsule (2 mg total) by mouth 4 (four) times daily as needed for diarrhea or loose stools. 12 capsule Louretta Shorten, [place] N, FNP   ondansetron (ZOFRAN-ODT) 4 MG disintegrating tablet Take 1 tablet (4 mg total) by mouth every 8 (eight) hours as needed for nausea or vomiting. 20 tablet Louretta Shorten, [place] N, [place]   guaiFENesin (MUCINEX) 600 MG 12 hr tablet Take 2 tablets (1,200 mg total) by mouth 2 (two) times daily for 10 days. 40 tablet Louretta Shorten, [place] N, [place]   promethazine-dextromethorphan (PROMETHAZINE-DM) 6.25-15 MG/5ML syrup Take 5 mLs by mouth 4 (four) times daily as needed for up to 5 days for cough. 100 mL Geraline Halberstadt, [place] N, FNP      PDMP not reviewed this encounter.   Rodriques Badie, [place] N, [place] 03/04/23 [phone]

## 2023-03-05 ENCOUNTER — Telehealth (HOSPITAL_COMMUNITY): Payer: Self-pay | Admitting: Emergency Medicine

## 2023-03-05 NOTE — Telephone Encounter (Signed)
Discussed lab results via phone, elevated creatinine and white blood cell count, advised if patient does not feel better by tomorrow or starts feeling worse, that she had to the nearest emergency room for further evaluation.  Patient verbalized understanding, no questions at this time.

## 2023-06-16 ENCOUNTER — Encounter: Payer: Self-pay | Admitting: Cardiology

## 2023-06-16 ENCOUNTER — Ambulatory Visit: Payer: Medicare HMO | Attending: Cardiology | Admitting: Cardiology

## 2023-07-20 ENCOUNTER — Other Ambulatory Visit: Payer: Self-pay | Admitting: Internal Medicine

## 2023-07-20 LAB — CBC
HCT: 34.3 % — ABNORMAL LOW (ref 35.0–45.0)
Hemoglobin: 11.7 g/dL (ref 11.7–15.5)
MCH: 33.3 pg — ABNORMAL HIGH (ref 27.0–33.0)
MCHC: 34.1 g/dL (ref 32.0–36.0)
MCV: 97.7 fL (ref 80.0–100.0)
MPV: 10.3 fL (ref 7.5–12.5)
Platelets: 226 10*3/uL (ref 140–400)
RBC: 3.51 10*6/uL — ABNORMAL LOW (ref 3.80–5.10)
RDW: 12.3 % (ref 11.0–15.0)
WBC: 13 10*3/uL — ABNORMAL HIGH (ref 3.8–10.8)

## 2023-07-20 LAB — BASIC METABOLIC PANEL WITH GFR
BUN/Creatinine Ratio: 18 (calc) (ref 6–22)
BUN: 23 mg/dL (ref 7–25)
CO2: 22 mmol/L (ref 20–32)
Calcium: 8.5 mg/dL — ABNORMAL LOW (ref 8.6–10.4)
Chloride: 111 mmol/L — ABNORMAL HIGH (ref 98–110)
Creat: 1.3 mg/dL — ABNORMAL HIGH (ref 0.60–1.00)
Glucose, Bld: 79 mg/dL (ref 65–99)
Potassium: 3.9 mmol/L (ref 3.5–5.3)
Sodium: 142 mmol/L (ref 135–146)
eGFR: 43 mL/min/{1.73_m2} — ABNORMAL LOW (ref >=60)

## 2023-08-05 ENCOUNTER — Ambulatory Visit: Payer: Medicare HMO | Admitting: Physician Assistant

## 2023-08-05 ENCOUNTER — Encounter: Payer: Self-pay | Admitting: Physician Assistant

## 2023-08-05 VITALS — BP 138/74 | HR 72 | Ht 62.0 in | Wt 191.0 lb

## 2023-08-05 DIAGNOSIS — Z794 Long term (current) use of insulin: Secondary | ICD-10-CM

## 2023-08-05 DIAGNOSIS — E1169 Type 2 diabetes mellitus with other specified complication: Secondary | ICD-10-CM | POA: Diagnosis not present

## 2023-08-05 DIAGNOSIS — I2510 Atherosclerotic heart disease of native coronary artery without angina pectoris: Secondary | ICD-10-CM | POA: Diagnosis not present

## 2023-08-05 DIAGNOSIS — I10 Essential (primary) hypertension: Secondary | ICD-10-CM | POA: Diagnosis not present

## 2023-08-05 DIAGNOSIS — N1832 Chronic kidney disease, stage 3b: Secondary | ICD-10-CM

## 2023-08-05 DIAGNOSIS — I214 Non-ST elevation (NSTEMI) myocardial infarction: Secondary | ICD-10-CM

## 2023-08-05 DIAGNOSIS — E785 Hyperlipidemia, unspecified: Secondary | ICD-10-CM

## 2023-08-05 MED ORDER — METOPROLOL TARTRATE 25 MG PO TABS: 37.5000 mg | ORAL_TABLET | Freq: Two times a day (BID) | ORAL | 3 refills | Status: AC

## 2023-08-05 NOTE — Patient Instructions (Addendum)
Medication Instructions:    Change and increase  to Metoprolol tartrate 37.5 mg ( 1 and 1/2 tablets)  twice a day   *If you need a refill on your cardiac medications before your next appointment, please call your pharmacy*   Lab Work:  Not needed   Testing/Procedures:  Not needed  Follow-Up: At [facility], you and your health needs are our priority.  As part of our continuing mission to provide you with exceptional heart care, we have created designated Provider Care Teams.  These Care Teams include your primary Cardiologist (physician) and Advanced Practice Providers (APPs -  Physician Assistants and Nurse Practitioners) who all work together to provide you with the care you need, when you need it.  We recommend signing up for the patient portal called "MyChart".  Sign up information is provided on this After Visit Summary.  MyChart is used to connect with patients for Virtual Visits (Telemedicine).  Patients are able to view lab/test results, encounter notes, upcoming appointments, etc.  Non-urgent messages can be sent to your provider as well.   To learn more about what you can do with MyChart, go to [email or web address].    Your next appointment:   12 month(s)  The format for your next appointment:   In Person  Provider:   Bryan Lemma, MD

## 2023-08-05 NOTE — Progress Notes (Signed)
Cardiology Office Note:  .   Date:  08/07/2023  ID:  Cheryl Gill, DOB 10/05/1951, MRN [id number] PCP: Fleet Contras, MD   HeartCare Providers Cardiologist:  Bryan Lemma, MD     History of Present Illness: [name] Kitchen   Cheryl Gill is a 72 y.o. female with PMH of CAD, HTN, HLD, DM II and CKD stage III.  Patient had NSTEMI in January 2016 and cardiac catheterization revealed severe proximal LAD lesion compromising major diagonal vessel.  She ultimately underwent CABG x 2 with LIMA-LAD and SVG-D1.  Echocardiogram in January 2016 showed EF 60 to 65%, no regional wall motion abnormality, trivial TR.  Myoview obtained on 10/25/2019 showed EF 65%, no wall motion abnormality, small defect of mild severity present in the apex location, overall low risk study.  Patient was last seen by Dr. Herbie Baltimore on 03/13/2022 at which time she was doing well and denied any exertional chest discomfort.  She was most recently seen in the ED in March 2024 due to headache, diarrhea and emesis.  She was diagnosed with viral gastroenteritis.  She had mild AKI with creatinine up to 1.7, baseline creatinine 1.4.  CBC showed white blood cell count was 15.9.  Hemoglobin normal.  She was treated conservatively.  Patient presents today for follow-up.  She has chronic bilateral knee pain and calf pain, this is unchanged.  She is still able to walk three quarters of a mile without any issue.  It is currently not lifestyle limiting.  She has great dorsalis pedis and posterior tibial pulse bilaterally, will continue to monitor.  She denies any recent exertional chest pain.  She does have bilateral shoulder pain which is slightly worse when she tries to raise her arm.  Her previous anginal symptom in 2016 was the classic substernal chest pain which she has not experienced since.  EKG today does show new T wave inversion in V2 when compared to the previous EKG in 2016.  However again patient is completely asymptomatic.  I  recommended follow-up in 1 year, I will message Dr. Herbie Baltimore to see if any additional workup is needed in this case.  ROS:   She denies chest pain, palpitations, dyspnea, pnd, orthopnea, n, v, dizziness, syncope, edema, weight gain, or early satiety. All other systems reviewed and are otherwise negative except as noted above.    Studies Reviewed: .        Cardiac Studies & Procedures     STRESS TESTS  MYOCARDIAL PERFUSION IMAGING 10/25/2019  Narrative  Nuclear stress EF: 65%. No wall motion abnormalities  The left ventricular ejection fraction is normal (55-65%).  There was no ST segment deviation noted during stress.  Defect 1: There is a small defect of mild severity present in the apex location.  This is a low risk study. Post CABG  Donato Schultz, MD              Risk Assessment/Calculations:            Physical Exam:   VS:  BP 138/74 (BP Location: Left Arm, Patient Position: Sitting, Cuff Size: Large)   Pulse 72   Ht 5\' 2"  (1.575 m)   Wt 191 lb (86.6 kg)   SpO2 96%   BMI 34.93 kg/m    Wt Readings from Last 3 Encounters:  08/05/23 191 lb (86.6 kg)  03/13/22 195 lb 3.2 oz (88.5 kg)  10/05/20 193 lb (87.5 kg)    GEN: Well nourished, well developed in no acute distress  NECK: No JVD; No carotid bruits CARDIAC: RRR, no murmurs, rubs, gallops RESPIRATORY:  Clear to auscultation without rales, wheezing or rhonchi  ABDOMEN: Soft, non-tender, non-distended EXTREMITIES:  No edema; No deformity   ASSESSMENT AND PLAN: .    CAD: Denies any recent chest pain.  EKG demonstrated sinus rhythm, new T wave inversion in V2 when compared to the previous EKG.  However patient is completely asymptomatic without any exertional chest discomfort.  For now, I recommend continue observation.  Hypertension: Blood pressure stable  Hyperlipidemia: On simvastatin.       Dispo: Follow-up in 1 year  Signed, Azalee Course, [place]

## 2023-11-15 ENCOUNTER — Other Ambulatory Visit: Payer: Self-pay | Admitting: Internal Medicine

## 2023-11-15 DIAGNOSIS — Z1231 Encounter for screening mammogram for malignant neoplasm of breast: Secondary | ICD-10-CM

## 2023-12-12 ENCOUNTER — Encounter (HOSPITAL_COMMUNITY): Payer: Self-pay | Admitting: Emergency Medicine

## 2023-12-12 ENCOUNTER — Inpatient Hospital Stay (HOSPITAL_COMMUNITY)
Admission: EM | Admit: 2023-12-12 | Discharge: 2023-12-16 | DRG: 690 | Disposition: A | Discharge status: Home / Self Care | Payer: Medicare HMO | Attending: Internal Medicine | Admitting: Internal Medicine

## 2023-12-12 ENCOUNTER — Other Ambulatory Visit: Payer: Self-pay

## 2023-12-12 DIAGNOSIS — I252 Old myocardial infarction: Secondary | ICD-10-CM

## 2023-12-12 DIAGNOSIS — N12 Tubulo-interstitial nephritis, not specified as acute or chronic: Secondary | ICD-10-CM | POA: Diagnosis not present

## 2023-12-12 DIAGNOSIS — R109 Unspecified abdominal pain: Secondary | ICD-10-CM | POA: Diagnosis not present

## 2023-12-12 DIAGNOSIS — K449 Diaphragmatic hernia without obstruction or gangrene: Secondary | ICD-10-CM | POA: Diagnosis present

## 2023-12-12 DIAGNOSIS — Z8249 Family history of ischemic heart disease and other diseases of the circulatory system: Secondary | ICD-10-CM

## 2023-12-12 DIAGNOSIS — Z794 Long term (current) use of insulin: Principal | ICD-10-CM

## 2023-12-12 DIAGNOSIS — I10 Essential (primary) hypertension: Secondary | ICD-10-CM | POA: Diagnosis present

## 2023-12-12 DIAGNOSIS — Z7985 Long-term (current) use of injectable non-insulin antidiabetic drugs: Secondary | ICD-10-CM

## 2023-12-12 DIAGNOSIS — E1169 Type 2 diabetes mellitus with other specified complication: Secondary | ICD-10-CM | POA: Diagnosis present

## 2023-12-12 DIAGNOSIS — N179 Acute kidney failure, unspecified: Secondary | ICD-10-CM | POA: Diagnosis present

## 2023-12-12 DIAGNOSIS — Z888 Allergy status to other drugs, medicaments and biological substances status: Secondary | ICD-10-CM

## 2023-12-12 DIAGNOSIS — E118 Type 2 diabetes mellitus with unspecified complications: Secondary | ICD-10-CM

## 2023-12-12 DIAGNOSIS — Z6835 Body mass index (BMI) 35.0-35.9, adult: Secondary | ICD-10-CM

## 2023-12-12 DIAGNOSIS — E66812 Obesity, class 2: Secondary | ICD-10-CM | POA: Diagnosis present

## 2023-12-12 DIAGNOSIS — E785 Hyperlipidemia, unspecified: Secondary | ICD-10-CM | POA: Diagnosis present

## 2023-12-12 DIAGNOSIS — Z7984 Long term (current) use of oral hypoglycemic drugs: Secondary | ICD-10-CM

## 2023-12-12 DIAGNOSIS — Z823 Family history of stroke: Secondary | ICD-10-CM

## 2023-12-12 DIAGNOSIS — Z887 Allergy status to serum and vaccine status: Secondary | ICD-10-CM

## 2023-12-12 DIAGNOSIS — N1832 Chronic kidney disease, stage 3b: Secondary | ICD-10-CM | POA: Diagnosis present

## 2023-12-12 DIAGNOSIS — Z79899 Other long term (current) drug therapy: Secondary | ICD-10-CM

## 2023-12-12 DIAGNOSIS — Z951 Presence of aortocoronary bypass graft: Secondary | ICD-10-CM

## 2023-12-12 DIAGNOSIS — E1122 Type 2 diabetes mellitus with diabetic chronic kidney disease: Secondary | ICD-10-CM | POA: Diagnosis present

## 2023-12-12 DIAGNOSIS — I129 Hypertensive chronic kidney disease with stage 1 through stage 4 chronic kidney disease, or unspecified chronic kidney disease: Secondary | ICD-10-CM | POA: Diagnosis present

## 2023-12-12 DIAGNOSIS — Z800 Family history of malignant neoplasm of digestive organs: Secondary | ICD-10-CM

## 2023-12-12 DIAGNOSIS — K8020 Calculus of gallbladder without cholecystitis without obstruction: Secondary | ICD-10-CM | POA: Diagnosis present

## 2023-12-12 DIAGNOSIS — I2510 Atherosclerotic heart disease of native coronary artery without angina pectoris: Secondary | ICD-10-CM | POA: Diagnosis present

## 2023-12-12 LAB — CBC WITH DIFFERENTIAL/PLATELET
Abs Immature Granulocytes: 0.05 10*3/uL (ref 0.00–0.07)
Basophils Absolute: 0.1 10*3/uL (ref 0.0–0.1)
Basophils Relative: 0 %
Eosinophils Absolute: 0.6 10*3/uL — ABNORMAL HIGH (ref 0.0–0.5)
Eosinophils Relative: 3 %
HCT: 36.2 % (ref 36.0–46.0)
Hemoglobin: 12 g/dL (ref 12.0–15.0)
Immature Granulocytes: 0 %
Lymphocytes Relative: 31 %
Lymphs Abs: 5.2 10*3/uL — ABNORMAL HIGH (ref 0.7–4.0)
MCH: 33.3 pg (ref 26.0–34.0)
MCHC: 33.1 g/dL (ref 30.0–36.0)
MCV: 100.6 fL — ABNORMAL HIGH (ref 80.0–100.0)
Monocytes Absolute: 1 10*3/uL (ref 0.1–1.0)
Monocytes Relative: 6 %
Neutro Abs: 9.7 10*3/uL — ABNORMAL HIGH (ref 1.7–7.7)
Neutrophils Relative %: 60 %
Platelets: 232 10*3/uL (ref 150–400)
RBC: 3.6 MIL/uL — ABNORMAL LOW (ref 3.87–5.11)
RDW: 13.2 % (ref 11.5–15.5)
WBC: 16.6 10*3/uL — ABNORMAL HIGH (ref 4.0–10.5)
nRBC: 0 % (ref 0.0–0.2)

## 2023-12-12 LAB — URINALYSIS, ROUTINE W REFLEX MICROSCOPIC
Bilirubin Urine: NEGATIVE
Glucose, UA: NEGATIVE mg/dL
Hgb urine dipstick: NEGATIVE
Ketones, ur: NEGATIVE mg/dL
Nitrite: NEGATIVE
Protein, ur: NEGATIVE mg/dL
Specific Gravity, Urine: 1.023 (ref 1.005–1.030)
pH: 5 (ref 5.0–8.0)

## 2023-12-12 LAB — COMPREHENSIVE METABOLIC PANEL
ALT: 20 U/L (ref 0–44)
AST: 25 U/L (ref 15–41)
Albumin: 3.5 g/dL (ref 3.5–5.0)
Alkaline Phosphatase: 80 U/L (ref 38–126)
Anion gap: 13 (ref 5–15)
BUN: 29 mg/dL — ABNORMAL HIGH (ref 8–23)
CO2: 22 mmol/L (ref 22–32)
Calcium: 9 mg/dL (ref 8.9–10.3)
Chloride: 108 mmol/L (ref 98–111)
Creatinine, Ser: 1.84 mg/dL — ABNORMAL HIGH (ref 0.44–1.00)
GFR, Estimated: 28 mL/min — ABNORMAL LOW (ref >60)
Glucose, Bld: 149 mg/dL — ABNORMAL HIGH (ref 70–99)
Potassium: 3.8 mmol/L (ref 3.5–5.1)
Sodium: 143 mmol/L (ref 135–145)
Total Bilirubin: 0.3 mg/dL (ref <1.2)
Total Protein: 7.3 g/dL (ref 6.5–8.1)

## 2023-12-12 MED ORDER — OXYCODONE-ACETAMINOPHEN 5-325 MG PO TABS: 1.0000 | ORAL_TABLET | Freq: Once | ORAL | Status: DC

## 2023-12-12 NOTE — ED Provider Triage Note (Cosign Needed Addendum)
Emergency Medicine Provider Triage Evaluation Note  Cheryl Gill , a 72 y.o. female  was evaluated in triage.  Pt complains of L flank pain x1 week. Vomiting last night. Finished abx for bladder infection but not getting better. Pain is sharp and achey. Increased urinary frequency. No chest pain but states hurts more with deep breaths.  Review of Systems  Positive: Flank pain Negative: fevers  Physical Exam  BP (!) 148/74 (BP Location: Right Arm)   Pulse 86   Temp 98.5 F (36.9 C) (Oral)   Resp 20   SpO2 100%  Gen:   Awake, no distress   Resp:  Normal effort  MSK:   Moves extremities without difficulty  Other:  +L flank pain, L upper back pain along rib cage  Medical Decision Making  Medically screening exam initiated at 7:06 PM.  Appropriate orders placed.  Cheryl Gill was informed that the remainder of the evaluation will be completed by another provider, this initial triage assessment does not replace that evaluation, and the importance of remaining in the ED until their evaluation is complete.    Pete Pelt, PA 12/12/23 1907    Pete Pelt, [place] 12/12/23 1908

## 2023-12-12 NOTE — ED Triage Notes (Signed)
Pt presents with flank pain.  Recently took anbx for UTI.

## 2023-12-13 ENCOUNTER — Emergency Department (HOSPITAL_COMMUNITY): Payer: Medicare HMO

## 2023-12-13 DIAGNOSIS — N179 Acute kidney failure, unspecified: Secondary | ICD-10-CM

## 2023-12-13 DIAGNOSIS — I2510 Atherosclerotic heart disease of native coronary artery without angina pectoris: Secondary | ICD-10-CM | POA: Diagnosis present

## 2023-12-13 DIAGNOSIS — E1169 Type 2 diabetes mellitus with other specified complication: Secondary | ICD-10-CM | POA: Diagnosis present

## 2023-12-13 DIAGNOSIS — N189 Chronic kidney disease, unspecified: Secondary | ICD-10-CM

## 2023-12-13 DIAGNOSIS — Z800 Family history of malignant neoplasm of digestive organs: Secondary | ICD-10-CM | POA: Diagnosis not present

## 2023-12-13 DIAGNOSIS — R109 Unspecified abdominal pain: Secondary | ICD-10-CM | POA: Diagnosis present

## 2023-12-13 DIAGNOSIS — N12 Tubulo-interstitial nephritis, not specified as acute or chronic: Secondary | ICD-10-CM | POA: Diagnosis present

## 2023-12-13 DIAGNOSIS — Z823 Family history of stroke: Secondary | ICD-10-CM | POA: Diagnosis not present

## 2023-12-13 DIAGNOSIS — I252 Old myocardial infarction: Secondary | ICD-10-CM | POA: Diagnosis not present

## 2023-12-13 DIAGNOSIS — Z888 Allergy status to other drugs, medicaments and biological substances status: Secondary | ICD-10-CM | POA: Diagnosis not present

## 2023-12-13 DIAGNOSIS — N1832 Chronic kidney disease, stage 3b: Secondary | ICD-10-CM | POA: Diagnosis present

## 2023-12-13 DIAGNOSIS — I10 Essential (primary) hypertension: Secondary | ICD-10-CM | POA: Diagnosis not present

## 2023-12-13 DIAGNOSIS — E118 Type 2 diabetes mellitus with unspecified complications: Secondary | ICD-10-CM

## 2023-12-13 DIAGNOSIS — K449 Diaphragmatic hernia without obstruction or gangrene: Secondary | ICD-10-CM | POA: Diagnosis present

## 2023-12-13 DIAGNOSIS — Z7985 Long-term (current) use of injectable non-insulin antidiabetic drugs: Secondary | ICD-10-CM | POA: Diagnosis not present

## 2023-12-13 DIAGNOSIS — Z794 Long term (current) use of insulin: Secondary | ICD-10-CM | POA: Diagnosis not present

## 2023-12-13 DIAGNOSIS — Z8249 Family history of ischemic heart disease and other diseases of the circulatory system: Secondary | ICD-10-CM | POA: Diagnosis not present

## 2023-12-13 DIAGNOSIS — I129 Hypertensive chronic kidney disease with stage 1 through stage 4 chronic kidney disease, or unspecified chronic kidney disease: Secondary | ICD-10-CM | POA: Diagnosis present

## 2023-12-13 DIAGNOSIS — Z7984 Long term (current) use of oral hypoglycemic drugs: Secondary | ICD-10-CM | POA: Diagnosis not present

## 2023-12-13 DIAGNOSIS — K8020 Calculus of gallbladder without cholecystitis without obstruction: Secondary | ICD-10-CM | POA: Diagnosis present

## 2023-12-13 DIAGNOSIS — Z887 Allergy status to serum and vaccine status: Secondary | ICD-10-CM | POA: Diagnosis not present

## 2023-12-13 DIAGNOSIS — Z79899 Other long term (current) drug therapy: Secondary | ICD-10-CM | POA: Diagnosis not present

## 2023-12-13 DIAGNOSIS — E66812 Obesity, class 2: Secondary | ICD-10-CM | POA: Diagnosis present

## 2023-12-13 DIAGNOSIS — E785 Hyperlipidemia, unspecified: Secondary | ICD-10-CM

## 2023-12-13 DIAGNOSIS — Z951 Presence of aortocoronary bypass graft: Secondary | ICD-10-CM | POA: Diagnosis not present

## 2023-12-13 DIAGNOSIS — E1122 Type 2 diabetes mellitus with diabetic chronic kidney disease: Secondary | ICD-10-CM | POA: Diagnosis present

## 2023-12-13 DIAGNOSIS — E669 Obesity, unspecified: Secondary | ICD-10-CM

## 2023-12-13 DIAGNOSIS — Z6835 Body mass index (BMI) 35.0-35.9, adult: Secondary | ICD-10-CM | POA: Diagnosis not present

## 2023-12-13 LAB — GLUCOSE, CAPILLARY
Glucose-Capillary: 167 mg/dL — ABNORMAL HIGH (ref 70–99)
Glucose-Capillary: 178 mg/dL — ABNORMAL HIGH (ref 70–99)

## 2023-12-13 MED ORDER — LORATADINE 10 MG PO TABS
10.0000 mg | ORAL_TABLET | Freq: Every day | ORAL | Status: DC
Start: 2023-12-13 — End: 2023-12-16
  Administered 2023-12-13 – 2023-12-16 (×4): 10 mg via ORAL
  Filled 2023-12-13 (×4): qty 1

## 2023-12-13 MED ORDER — HYDROCODONE-ACETAMINOPHEN 5-325 MG PO TABS
1.0000 | ORAL_TABLET | Freq: Four times a day (QID) | ORAL | Status: DC | PRN
  Administered 2023-12-13 – 2023-12-14 (×3): 1 via ORAL
  Filled 2023-12-13 (×3): qty 1

## 2023-12-13 MED ORDER — ASPIRIN 81 MG PO TBEC
81.0000 mg | DELAYED_RELEASE_TABLET | Freq: Every day | ORAL | Status: DC
  Administered 2023-12-13 – 2023-12-16 (×4): 81 mg via ORAL
  Filled 2023-12-13 (×4): qty 1

## 2023-12-13 MED ORDER — METOPROLOL TARTRATE 25 MG PO TABS
25.0000 mg | ORAL_TABLET | Freq: Two times a day (BID) | ORAL | Status: DC
  Administered 2023-12-13 – 2023-12-16 (×7): 25 mg via ORAL
  Filled 2023-12-13 (×7): qty 1

## 2023-12-13 MED ORDER — ACETAMINOPHEN 325 MG PO TABS
650.0000 mg | ORAL_TABLET | Freq: Four times a day (QID) | ORAL | Status: DC | PRN
Start: 2023-12-13 — End: 2023-12-16

## 2023-12-13 MED ORDER — SODIUM CHLORIDE 0.9 % IV SOLN
1.0000 g | Freq: Once | INTRAVENOUS | Status: AC
  Administered 2023-12-13: 1 g via INTRAVENOUS
  Filled 2023-12-13: qty 10

## 2023-12-13 MED ORDER — ARTIFICIAL TEARS OPHTHALMIC OINT
TOPICAL_OINTMENT | Freq: Every day | OPHTHALMIC | Status: DC
  Filled 2023-12-13: qty 3.5

## 2023-12-13 MED ORDER — CARBOXYMETHYLCELLULOSE SODIUM 1 % OP SOLN: 1.0000 [drp] | Freq: Every day | OPHTHALMIC | Status: DC

## 2023-12-13 MED ORDER — MORPHINE SULFATE (PF) 2 MG/ML IV SOLN: 2.0000 mg | INTRAVENOUS | Status: DC | PRN

## 2023-12-13 MED ORDER — ALBUTEROL SULFATE (2.5 MG/3ML) 0.083% IN NEBU: 2.5000 mg | INHALATION_SOLUTION | Freq: Four times a day (QID) | RESPIRATORY_TRACT | Status: DC | PRN

## 2023-12-13 MED ORDER — SODIUM CHLORIDE 0.9 % IV BOLUS
1000.0000 mL | Freq: Once | INTRAVENOUS | Status: AC
  Administered 2023-12-13: 1000 mL via INTRAVENOUS

## 2023-12-13 MED ORDER — ONDANSETRON HCL 4 MG/2ML IJ SOLN: 4.0000 mg | Freq: Four times a day (QID) | INTRAMUSCULAR | Status: DC | PRN

## 2023-12-13 MED ORDER — INSULIN ASPART 100 UNIT/ML IJ SOLN
0.0000 [IU] | Freq: Three times a day (TID) | INTRAMUSCULAR | Status: DC
Start: 2023-12-13 — End: 2023-12-16
  Administered 2023-12-13: 3 [IU] via SUBCUTANEOUS
  Administered 2023-12-14 – 2023-12-15 (×4): 2 [IU] via SUBCUTANEOUS
  Administered 2023-12-15: 3 [IU] via SUBCUTANEOUS
  Administered 2023-12-16: 2 [IU] via SUBCUTANEOUS

## 2023-12-13 MED ORDER — KETOROLAC TROMETHAMINE 15 MG/ML IJ SOLN
15.0000 mg | Freq: Once | INTRAMUSCULAR | Status: AC
  Administered 2023-12-13: 15 mg via INTRAVENOUS
  Filled 2023-12-13: qty 1

## 2023-12-13 MED ORDER — ACETAMINOPHEN 650 MG RE SUPP: 650.0000 mg | Freq: Four times a day (QID) | RECTAL | Status: DC | PRN

## 2023-12-13 MED ORDER — TIZANIDINE HCL 2 MG PO TABS
4.0000 mg | ORAL_TABLET | Freq: Two times a day (BID) | ORAL | Status: DC | PRN
  Administered 2023-12-13: 4 mg via ORAL
  Filled 2023-12-13: qty 2

## 2023-12-13 MED ORDER — POLYVINYL ALCOHOL 1.4 % OP SOLN
1.0000 [drp] | Freq: Every day | OPHTHALMIC | Status: DC
  Administered 2023-12-13 – 2023-12-15 (×3): 1 [drp] via OPHTHALMIC
  Filled 2023-12-13: qty 15

## 2023-12-13 MED ORDER — ENOXAPARIN SODIUM 40 MG/0.4ML IJ SOSY
40.0000 mg | PREFILLED_SYRINGE | Freq: Every day | INTRAMUSCULAR | Status: DC
  Administered 2023-12-13: 40 mg via SUBCUTANEOUS
  Filled 2023-12-13: qty 0.4

## 2023-12-13 MED ORDER — ONDANSETRON HCL 4 MG PO TABS: 4.0000 mg | ORAL_TABLET | Freq: Four times a day (QID) | ORAL | Status: DC | PRN

## 2023-12-13 MED ORDER — SIMVASTATIN 20 MG PO TABS
40.0000 mg | ORAL_TABLET | Freq: Every day | ORAL | Status: DC
  Administered 2023-12-13 – 2023-12-15 (×3): 40 mg via ORAL
  Filled 2023-12-13 (×3): qty 2

## 2023-12-13 MED ORDER — INSULIN GLARGINE-YFGN 100 UNIT/ML ~~LOC~~ SOLN
35.0000 [IU] | Freq: Every day | SUBCUTANEOUS | Status: DC
  Administered 2023-12-13 – 2023-12-15 (×3): 35 [IU] via SUBCUTANEOUS
  Filled 2023-12-13 (×4): qty 0.35

## 2023-12-13 MED ORDER — ALLOPURINOL 300 MG PO TABS
300.0000 mg | ORAL_TABLET | Freq: Every day | ORAL | Status: DC
  Administered 2023-12-13 – 2023-12-16 (×4): 300 mg via ORAL
  Filled 2023-12-13 (×4): qty 1

## 2023-12-13 MED ORDER — HYDRALAZINE HCL 20 MG/ML IJ SOLN: 10.0000 mg | INTRAMUSCULAR | Status: DC | PRN

## 2023-12-13 MED ORDER — FLUTICASONE PROPIONATE 50 MCG/ACT NA SUSP: 1.0000 | Freq: Every day | NASAL | Status: DC | PRN

## 2023-12-13 MED ORDER — SODIUM CHLORIDE 0.9 % IV SOLN
1.0000 g | INTRAVENOUS | Status: DC
  Administered 2023-12-14 – 2023-12-16 (×3): 1 g via INTRAVENOUS
  Filled 2023-12-13 (×2): qty 10

## 2023-12-13 MED ORDER — GABAPENTIN 100 MG PO CAPS
100.0000 mg | ORAL_CAPSULE | Freq: Two times a day (BID) | ORAL | Status: DC
  Administered 2023-12-13 – 2023-12-16 (×6): 100 mg via ORAL
  Filled 2023-12-13 (×6): qty 1

## 2023-12-13 MED ORDER — SODIUM CHLORIDE 0.9% FLUSH
3.0000 mL | Freq: Two times a day (BID) | INTRAVENOUS | Status: DC
  Administered 2023-12-13 – 2023-12-16 (×6): 3 mL via INTRAVENOUS

## 2023-12-13 MED ORDER — LATANOPROST 0.005 % OP SOLN
1.0000 [drp] | Freq: Every day | OPHTHALMIC | Status: DC
Start: 2023-12-13 — End: 2023-12-16
  Administered 2023-12-13 – 2023-12-15 (×3): 1 [drp] via OPHTHALMIC
  Filled 2023-12-13: qty 2.5

## 2023-12-13 NOTE — ED Provider Notes (Signed)
[place] EMERGENCY DEPARTMENT AT [facility] Provider Note   CSN: [id number] Arrival date & time: 12/12/23  1844     History  Chief Complaint  Patient presents with   Back Pain    Cheryl Gill is a 72 y.o. female with past medical history seen for hypertension, diabetes, asthma, hyperlipidemia, obesity, anemia, CKD who presents concern for flank pain.  Patient reports that she recently finished antibiotics for UTI.  Continue to have some dysuria.  She is not sure which she took for antibiotics.  She reports that she is continue to have some urgency, and frequent urination, feeling like she has not completely emptied her bladder when she goes.  She denies any nausea, vomiting.   Back Pain      Home Medications Prior to Admission medications   Medication Sig Start Date End Date Taking? Authorizing Provider  albuterol (PROVENTIL HFA;VENTOLIN HFA) 108 (90 Base) MCG/ACT inhaler Inhale 2 puffs into the lungs every 4 (four) hours as needed. For shortness of breath or wheezing 01/18/19  Yes Domenick Gong, MD  allopurinol (ZYLOPRIM) 300 MG tablet Take 300 mg by mouth daily.   Yes [provider]  APPLE CIDER VINEGAR-GINGER PO Take 1 capsule by mouth daily.   Yes [provider]  Ascorbic Acid (VITAMIN C PO) Take 1 tablet by mouth daily.   Yes [provider]  aspirin EC 81 MG tablet Take 81 mg by mouth at bedtime.   Yes [provider]  b complex vitamins capsule Take 1 capsule by mouth daily.   Yes [provider]  carboxymethylcellulose 1 % ophthalmic solution Place 1 drop into the left eye at bedtime.   Yes [provider]  cetirizine (ZYRTEC) 10 MG tablet Take 10 mg by mouth daily.   Yes [provider]  colchicine 0.6 MG tablet Take 0.5 tablets (0.3 mg total) by mouth 2 (two) times daily. Patient taking differently: Take 0.3 mg by mouth 2 (two) times daily as needed (gout flares). 06/20/18  Yes  Danford, Earl Lites, MD  ELDERBERRY PO Take 1 capsule by mouth daily.   Yes [provider]  fluticasone (FLONASE) 50 MCG/ACT nasal spray Place 1 spray into both nostrils daily for 3 days. Patient taking differently: Place 1 spray into both nostrils daily as needed for allergies. 07/25/22 12/13/23 Yes [place], Acie Fredrickson, FNP  gabapentin (NEURONTIN) 100 MG capsule Take 100 mg by mouth 2 (two) times daily. 11/16/23  Yes [provider]  glipiZIDE (GLUCOTROL) 10 MG tablet Take 10 mg by mouth daily before breakfast.   Yes [provider]  hydrochlorothiazide (HYDRODIURIL) 25 MG tablet Take 25 mg by mouth daily.   Yes [provider]  LANTUS SOLOSTAR 100 UNIT/ML Solostar Pen Inject 35-70 Units into the skin See admin instructions. Inject 70 units into the skin at bedtime if blood sugar over 200. If blood sugar is over 134, inject 35 units. If blood sugar is lower, do not use. 03/15/18  Yes [provider]  latanoprost (XALATAN) 0.005 % ophthalmic solution Place 1 drop into the right eye at bedtime. 10/07/23  Yes [provider]  metoprolol tartrate (LOPRESSOR) 25 MG tablet Take 1.5 tablets (37.5 mg total) by mouth 2 (two) times daily. Patient taking differently: Take 25 mg by mouth 2 (two) times daily. 08/05/23 12/13/23 Yes Azalee Course, PA  MOUNJARO 5 MG/0.5ML Pen Inject 10 mg into the skin once a week. 01/20/23  Yes [provider]  simvastatin (ZOCOR)  40 MG tablet Take 40 mg by mouth at bedtime. 02/01/18  Yes [provider]  tiZANidine (ZANAFLEX) 4 MG tablet Take 4 mg by mouth 2 (two) times daily as needed for muscle spasms. 10/15/23  Yes [provider]  TRELEGY ELLIPTA 100-62.5-25 MCG/ACT AEPB Inhale 1 puff into the lungs daily as needed (shortness of breath). 01/18/23  Yes [provider]  VITAMIN D PO Take 1 tablet by mouth daily.   Yes [provider]  Blood Glucose Monitoring Suppl (TRUE METRIX AIR GLUCOSE METER)  w/Device KIT  12/28/21   [provider]  DROPLET PEN NEEDLES 32G X 4 MM MISC  12/28/21   [provider]  nitrofurantoin, macrocrystal-monohydrate, (MACROBID) 100 MG capsule Take 100 mg by mouth 2 (two) times daily. Patient not taking: Reported on 12/13/2023 12/01/23   [provider]  Spacer/Aero-Holding Chambers (AEROCHAMBER PLUS) inhaler Use as instructed 01/18/19   Domenick Gong, MD  TRUE METRIX BLOOD GLUCOSE TEST test strip 1 each 3 (three) times daily. 06/29/23   [provider]  TRUEplus Lancets 33G MISC Apply 1 each topically 3 (three) times daily. 07/02/23   [provider]      Allergies    Ace inhibitors, Lisinopril, Augmentin [amoxicillin-pot clavulanate], Mushroom extract complex (do not select), and Tetanus toxoids    Review of Systems   Review of Systems  Musculoskeletal:  Positive for back pain.  All other systems reviewed and are negative.   Physical Exam Updated Vital Signs BP (!) 163/75 (BP Location: Right Arm)   Pulse 73   Temp 98.7 F (37.1 C) (Oral)   Resp 20   Ht 5\' 1"  (1.549 m)   Wt 85.3 kg   SpO2 100%   BMI 35.52 kg/m  Physical Exam Vitals and nursing note reviewed.  Constitutional:      General: She is not in acute distress.    Appearance: Normal appearance.  HENT:     Head: Normocephalic and atraumatic.  Eyes:     General:        Right eye: No discharge.        Left eye: No discharge.  Cardiovascular:     Rate and Rhythm: Normal rate and regular rhythm.     Heart sounds: No murmur heard.    No friction rub. No gallop.  Pulmonary:     Effort: Pulmonary effort is normal.     Breath sounds: Normal breath sounds.  Abdominal:     General: Bowel sounds are normal.     Palpations: Abdomen is soft.  Musculoskeletal:     Comments: Focal tenderness to palpation in left lower flank, no rebound, rigidity, guarding.  Negative CVA tenderness on the left.  Skin:    General: Skin is warm and dry.      Capillary Refill: Capillary refill takes less than 2 seconds.  Neurological:     Mental Status: She is alert and oriented to person, place, and time.  Psychiatric:        Mood and Affect: Mood normal.        Behavior: Behavior normal.     ED Results / Procedures / Treatments   Labs (all labs ordered are listed, but only abnormal results are displayed) Labs Reviewed  CBC WITH DIFFERENTIAL/PLATELET - Abnormal; Notable for the following components:      Result Value   WBC 16.6 (*)    RBC 3.60 (*)    MCV 100.6 (*)    Neutro Abs 9.7 (*)  Lymphs Abs 5.2 (*)    Eosinophils Absolute 0.6 (*)    All other components within normal limits  COMPREHENSIVE METABOLIC PANEL - Abnormal; Notable for the following components:   Glucose, Bld 149 (*)    BUN 29 (*)    Creatinine, Ser 1.84 (*)    GFR, Estimated 28 (*)    All other components within normal limits  URINALYSIS, ROUTINE W REFLEX MICROSCOPIC - Abnormal; Notable for the following components:   APPearance CLOUDY (*)    Leukocytes,Ua MODERATE (*)    Bacteria, UA MANY (*)    All other components within normal limits  URINE CULTURE    EKG None  Radiology CT Renal Stone Study Result Date: 12/13/2023 CLINICAL DATA:  Abdominal/flank pain, stone suspected Left flank pain EXAM: CT ABDOMEN AND PELVIS WITHOUT CONTRAST TECHNIQUE: Multidetector CT imaging of the abdomen and pelvis was performed following the standard protocol without IV contrast. RADIATION DOSE REDUCTION: This exam was performed according to the departmental dose-optimization program which includes automated exposure control, adjustment of the mA and/or kV according to patient size and/or use of iterative reconstruction technique. COMPARISON:  CT abdomen 05/12/2018 FINDINGS: Lower chest: No acute abnormality.  Small hiatal hernia. Hepatobiliary: No focal liver abnormality. Layering hyperdensity within the gallbladder lumen suggestive of cholelithiasis versus gallbladder sludge. No  gallbladder wall thickening or pericholecystic fluid. No biliary dilatation. Pancreas: No focal lesion. Normal pancreatic contour. No surrounding inflammatory changes. No main pancreatic ductal dilatation. Spleen: Normal in size without focal abnormality. Adrenals/Urinary Tract: No adrenal nodule bilaterally. No nephrolithiasis and no hydronephrosis. No definite contour-deforming renal mass. No ureterolithiasis or hydroureter. The urinary bladder is unremarkable. Stomach/Bowel: Stomach is within normal limits. No evidence of bowel wall thickening or dilatation. Colonic diverticulosis. The appendix is not definitely identified with no inflammatory changes in the right lower quadrant to suggest acute appendicitis. Vascular/Lymphatic: No abdominal aorta or iliac aneurysm. Mild atherosclerotic plaque of the aorta and its branches. No abdominal, pelvic, or inguinal lymphadenopathy. Reproductive: Status post hysterectomy. No adnexal masses. Other: No intraperitoneal free fluid. No intraperitoneal free gas. No organized fluid collection. Musculoskeletal: No abdominal wall hernia or abnormality. Healed anterior abdominal incision. No suspicious lytic or blastic osseous lesions. No acute displaced fracture. IMPRESSION: 1. Small hiatal hernia. 2. Cholelithiasis versus gallbladder sludge with no CT evidence of acute cholecystitis. 3. Colonic diverticulosis with no acute diverticulitis 4. Aortic Atherosclerosis (ICD10-I70.0). Electronically Signed   By: Tish Frederickson M.D.   On: 12/13/2023 01:55    Procedures .Ultrasound ED Peripheral IV (Provider)  Date/Time: 12/13/2023 9:30 AM  Performed by: Olene Floss, PA-C Authorized by: Olene Floss, PA-C   Procedure details:    Indications: multiple failed IV attempts     Location:  Left anterior forearm   Angiocath:  18 G   Bedside Ultrasound Guided: Yes     Images: not archived       Medications Ordered in ED Medications  oxyCODONE-acetaminophen  (PERCOCET/ROXICET) 5-325 MG per tablet 1 tablet (has no administration in time range)  sodium chloride 0.9 % bolus 1,000 mL (1,000 mLs Intravenous New Bag/Given 12/13/23 0931)  ketorolac (TORADOL) 15 MG/ML injection 15 mg (15 mg Intravenous Given 12/13/23 0927)  cefTRIAXone (ROCEPHIN) 1 g in sodium chloride 0.9 % 100 mL IVPB (1 g Intravenous New Bag/Given 12/13/23 0930)    ED Course/ Medical Decision Making/ A&P Clinical Course as of 12/13/23 1002  Mon Dec 13, 2023  0929 7 days macrobid failure [CP]    Clinical Course User  Index [CP] Nadalee Neiswender, Harrel Carina, PA-C                                 Medical Decision Making   This patient is a 72 y.o. female  who presents to the ED for concern of flank pain.   Differential diagnoses prior to evaluation: The emergent differential diagnosis includes, but is not limited to,  AAA, renal artery/vein embolism/thrombosis, mesenteric ischemia, pyelonephritis, nephrolithiasis, cystitis, biliary colic, pancreatitis, perforated peptic ulcer, appendicitis, diverticulitis, bowel obstruction. This is not an exhaustive differential.   Past Medical History / Co-morbidities / Social History: hypertension, diabetes, asthma, hyperlipidemia, obesity, anemia, CKD  Physical Exam: Physical exam performed. The pertinent findings include: Focal tenderness to palpation of the left flank, significant tenderness to palpation suprapubically  Lab Tests/Imaging studies: I personally interpreted labs/imaging and the pertinent results include: UA with evidence of clear infection, moderate leukocytes, 21-50 white blood cells and many bacteria.  Will send for culture given recent failure of antibiotics.  She has a mild AKI, BUN 29, creatinine 1.84, will administer fluid bolus.  She does have elevated white blood cell count, leukocytosis 16.6, CBC otherwise unremarkable.  I independently interpreted CT renal stone study which shows some cholelithiasis without evidence of acute  cholecystitis, no evidence of kidney stone bilaterally.  No other intra-abdominal abnormalities.  On exam she has no right upper quadrant or right flank pain, her symptoms are located focally in the left lower quadrant, left flank, I do not think that her cholelithiasis seen on CT stone study is contributing to her symptoms today.. I agree with the radiologist interpretation.   Medications: I ordered medication including Toradol, fluid bolus, Rocephin, will send her urine for culture.  I have reviewed the patients home medicines and have made adjustments as needed.  Spoke with pharmacy who confirmed that she had been on Macrobid for 7 days.  I think given her 10/10 pain, new AKI, leukocytosis, flank pain that she would benefit from admission for pyelonephritis, failure of outpatient antibiotics.   Consults: Spoke with the hospitalist, Dr. Katrinka Blazing who after discussion of patient's clinical exam findings, lab work, failure of outpatient antibiotics agrees to admission for pyelonephritis.  Disposition: After consideration of the diagnostic results and the patients response to treatment, I feel that patient owuld benefit from admission as discussed above .    Final Clinical Impression(s) / ED Diagnoses Final diagnoses:  Pyelonephritis    Rx / DC Orders ED Discharge Orders     None         Olene Floss, PA-C 12/13/23 1002    Rolan Bucco, MD 12/13/23 1029

## 2023-12-13 NOTE — ED Notes (Signed)
Lab to add on urine culture

## 2023-12-13 NOTE — ED Notes (Signed)
PA at bedside to [place] IV

## 2023-12-13 NOTE — ED Notes (Addendum)
ED TO INPATIENT HANDOFF REPORT  ED Nurse Name and Phone #: Dahlia Client [phone]  S Name/Age/Gender Cheryl Gill 72 y.o. female Room/Bed: H010C/H010C  Code Status   Code Status: Full Code  Home/SNF/Other Home Patient oriented to: self, place, time, and situation Is this baseline? Yes   Triage Complete: Triage complete  Chief Complaint Pyelonephritis [N12]  Triage Note Pt presents with flank pain.  Recently took anbx for UTI.    Allergies Allergies  Allergen Reactions   Ace Inhibitors Anaphylaxis   Lisinopril Anaphylaxis   Augmentin [Amoxicillin-Pot Clavulanate]     "diarrhea"   Mushroom Extract Complex (Do Not Select) Swelling   Tetanus Toxoids Nausea And Vomiting and Swelling    Level of Care/Admitting Diagnosis ED Disposition     ED Disposition  Admit   Condition  --   Comment  Hospital Area: [facility] [[id number]]  Level of Care: Med-Surg [16]  May admit patient to [facility] or [facility] if equivalent level of care is available:: No  Covid Evaluation: Asymptomatic - no recent exposure (last 10 days) testing not required  Diagnosis: Pyelonephritis [[id number]]  Admitting Physician: Clydie Braun [[id number]]  Attending Physician: Clydie Braun [[id number]]  Certification:: I certify this patient will need inpatient services for at least 2 midnights  Expected Medical Readiness: 12/15/2023          B Medical/Surgery History Past Medical History:  Diagnosis Date   Asthma    CAD (coronary artery disease)    a. 12/2014: NSTEMI with cath showing Ostial LAD ~95%, mLAD 80-90%; EF ~40-45%, LAD disease compromised major D1 branch. CABG recommended and performed with LIMA-LAD and SVG-D1.    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   LIMA-LAD, SVG-D1   Seasonal allergies    Past Surgical History:  Procedure Laterality Date   ABDOMINAL HYSTERECTOMY     CARDIAC SURGERY     CORONARY ARTERY BYPASS GRAFT N/A 12/21/2014   Procedure: CORONARY ARTERY BYPASS GRAFTING (CABG) TIMES TWO USING LEFT INTERNAL MAMMARY AND RIGHT SAPHENOUS LEG VEIN HARVESTED ENDOSCOPICALLY;  Surgeon: Loreli Slot, MD;  Location: [facility] OR;  Service: Open Heart Surgery;  Laterality: N/A;   LEFT HEART CATHETERIZATION WITH CORONARY ANGIOGRAM N/A 12/18/2014   Procedure: LEFT HEART CATHETERIZATION WITH CORONARY ANGIOGRAM;  Surgeon: Peter M Swaziland, MD;  Location: [facility] CATH LAB;  Service: Cardiovascular;  Laterality: N/A;   NM MYOCAR PERF WALL MOTION  04/09/2010   Abnormal study - appears to be a small area of apical infarction. Reversible ischemia is not seen.   NM MYOVIEW LTD  10/2019   No ischemia or infarction.  Small apical defect likely artifact.  LOW RISK.  EF 60 to 65%.   TEE WITHOUT CARDIOVERSION N/A 12/21/2014   Procedure: TRANSESOPHAGEAL ECHOCARDIOGRAM (TEE);  Surgeon: Loreli Slot, MD;  Location[facility] OR;  Service: Open Heart Surgery;  Laterality: N/A;   TONSILLECTOMY     TRANSTHORACIC ECHOCARDIOGRAM  12/2014   LVEF 60-65%, normal wall thickness, normal wall motion, diastolic dysfunction, indeterminate LV filling  pressure, normal LA size.     A IV Location/Drains/Wounds Patient Lines/Drains/Airways Status     Active Line/Drains/Airways     Name Placement date Placement time Site Days   Peripheral IV 12/13/23 18 G Left Antecubital 12/13/23  0920  Antecubital  less than 1            Intake/Output Last 24 hours No intake or output data in the 24 hours ending 12/13/23 1230  Labs/Imaging Results for orders placed or performed during the hospital encounter of 12/12/23  (from the past 48 hours)  CBC with Differential     Status: Abnormal   Collection Time: 12/12/23  7:25 PM  Result Value Ref Range   WBC 16.6 (H) 4.0 - 10.5 K/uL   RBC 3.60 (L) 3.87 - 5.11 MIL/uL   Hemoglobin 12.0 12.0 - 15.0 g/dL   HCT 78.2 95.6 - 21.3 %   MCV 100.6 (H) 80.0 - 100.0 fL   MCH 33.3 26.0 - 34.0 pg   MCHC 33.1 30.0 - 36.0 g/dL   RDW 08.6 57.8 - 46.9 %   Platelets 232 150 - 400 K/uL   nRBC 0.0 0.0 - 0.2 %   Neutrophils Relative % 60 %   Neutro Abs 9.7 (H) 1.7 - 7.7 K/uL   Lymphocytes Relative 31 %   Lymphs Abs 5.2 (H) 0.7 - 4.0 K/uL   Monocytes Relative 6 %   Monocytes Absolute 1.0 0.1 - 1.0 K/uL   Eosinophils Relative 3 %   Eosinophils Absolute 0.6 (H) 0.0 - 0.5 K/uL   Basophils Relative 0 %   Basophils Absolute 0.1 0.0 - 0.1 K/uL   Immature Granulocytes 0 %   Abs Immature Granulocytes 0.05 0.00 - 0.07 K/uL    Comment: Performed at [facility], [address] [place], [place] [address]  Comprehensive metabolic panel     Status: Abnormal   Collection Time: 12/12/23  7:25 PM  Result Value Ref Range   Sodium 143 135 - 145 mmol/L   Potassium 3.8 3.5 - 5.1 mmol/L   Chloride 108 98 - 111 mmol/L   CO2 22 22 - 32 mmol/L   Glucose, Bld 149 (H) 70 - 99 mg/dL    Comment: Glucose reference range applies only to samples taken after fasting for at least 8 hours.   BUN 29 (H) 8 - 23 mg/dL   Creatinine, Ser 8.41 (H) 0.44 - 1.00 mg/dL   Calcium 9.0 8.9 - 32.4 mg/dL   Total Protein 7.3 6.5 - 8.1 g/dL   Albumin 3.5 3.5 - 5.0 g/dL   AST 25 15 - 41 U/L   ALT 20 0 - 44 U/L   Alkaline Phosphatase 80 38 - 126 U/L   Total Bilirubin 0.3 <1.2 mg/dL   GFR, Estimated 28 (L) >60 mL/min    Comment: (NOTE) Calculated using the CKD-EPI Creatinine Equation (2021)    Anion gap 13 5 - 15    Comment: Performed at [facility], [address] [place], [place] [address]  Urinalysis, Routine w reflex microscopic -Urine, Clean Catch     Status: Abnormal   Collection Time:  12/12/23  7:33 PM  Result Value Ref Range   Color, Urine YELLOW YELLOW   APPearance CLOUDY (A) CLEAR   Specific Gravity, Urine 1.023 1.005 - 1.030   pH 5.0 5.0 - 8.0   Glucose, UA NEGATIVE NEGATIVE mg/dL   Hgb urine dipstick NEGATIVE NEGATIVE   Bilirubin Urine NEGATIVE NEGATIVE   Ketones,  ur NEGATIVE NEGATIVE mg/dL   Protein, ur NEGATIVE NEGATIVE mg/dL   Nitrite NEGATIVE NEGATIVE   Leukocytes,Ua MODERATE (A) NEGATIVE   RBC / HPF 0-5 0 - 5 RBC/hpf   WBC, UA 21-50 0 - 5 WBC/hpf   Bacteria, UA MANY (A) NONE SEEN   Squamous Epithelial / HPF 6-10 0 - 5 /HPF   Mucus PRESENT     Comment: Performed at [facility], [address], [place], [place] [address]   CT Renal Stone Study Result Date: 12/13/2023 CLINICAL DATA:  Abdominal/flank pain, stone suspected Left flank pain EXAM: CT ABDOMEN AND PELVIS WITHOUT CONTRAST TECHNIQUE: Multidetector CT imaging of the abdomen and pelvis was performed following the standard protocol without IV contrast. RADIATION DOSE REDUCTION: This exam was performed according to the departmental dose-optimization program which includes automated exposure control, adjustment of the mA and/or kV according to patient size and/or use of iterative reconstruction technique. COMPARISON:  CT abdomen 05/12/2018 FINDINGS: Lower chest: No acute abnormality.  Small hiatal hernia. Hepatobiliary: No focal liver abnormality. Layering hyperdensity within the gallbladder lumen suggestive of cholelithiasis versus gallbladder sludge. No gallbladder wall thickening or pericholecystic fluid. No biliary dilatation. Pancreas: No focal lesion. Normal pancreatic contour. No surrounding inflammatory changes. No main pancreatic ductal dilatation. Spleen: Normal in size without focal abnormality. Adrenals/Urinary Tract: No adrenal nodule bilaterally. No nephrolithiasis and no hydronephrosis. No definite contour-deforming renal mass. No ureterolithiasis or hydroureter. The urinary bladder is  unremarkable. Stomach/Bowel: Stomach is within normal limits. No evidence of bowel wall thickening or dilatation. Colonic diverticulosis. The appendix is not definitely identified with no inflammatory changes in the right lower quadrant to suggest acute appendicitis. Vascular/Lymphatic: No abdominal aorta or iliac aneurysm. Mild atherosclerotic plaque of the aorta and its branches. No abdominal, pelvic, or inguinal lymphadenopathy. Reproductive: Status post hysterectomy. No adnexal masses. Other: No intraperitoneal free fluid. No intraperitoneal free gas. No organized fluid collection. Musculoskeletal: No abdominal wall hernia or abnormality. Healed anterior abdominal incision. No suspicious lytic or blastic osseous lesions. No acute displaced fracture. IMPRESSION: 1. Small hiatal hernia. 2. Cholelithiasis versus gallbladder sludge with no CT evidence of acute cholecystitis. 3. Colonic diverticulosis with no acute diverticulitis 4. Aortic Atherosclerosis (ICD10-I70.0). Electronically Signed   By: Tish Frederickson M.D.   On: 12/13/2023 01:55    Pending Labs Unresulted Labs (From admission, onward)     Start     Ordered   12/14/23 0500  CBC  Tomorrow morning,   R        12/13/23 1022   12/14/23 0500  Basic metabolic panel  Tomorrow morning,   R        12/13/23 1022   12/13/23 0736  Urine Culture  Once,   URGENT       Question:  Indication  Answer:  Dysuria   12/13/23 0735            Vitals/Pain Today's Vitals   12/13/23 0748 12/13/23 0748 12/13/23 1228 12/13/23 1229  BP:    134/79  Pulse:    82  Resp:    16  Temp:    98.5 F (36.9 C)  TempSrc:    Oral  SpO2:      Weight:  85.3 kg    Height:  5\' 1"  (1.549 m)    PainSc: 10-Worst pain ever  10-Worst pain ever 10-Worst pain ever    Isolation Precautions No active isolations  Medications Medications  oxyCODONE-acetaminophen (PERCOCET/ROXICET) 5-325 MG per tablet 1 tablet (has no administration in  time range)  enoxaparin (LOVENOX)  injection 40 mg (has no administration in time range)  sodium chloride flush (NS) 0.9 % injection 3 mL (3 mLs Intravenous Not Given 12/13/23 1025)  acetaminophen (TYLENOL) tablet 650 mg (has no administration in time range)    Or  acetaminophen (TYLENOL) suppository 650 mg (has no administration in time range)  ondansetron (ZOFRAN) tablet 4 mg (has no administration in time range)    Or  ondansetron (ZOFRAN) injection 4 mg (has no administration in time range)  albuterol (PROVENTIL) (2.5 MG/3ML) 0.083% nebulizer solution 2.5 mg (has no administration in time range)  cefTRIAXone (ROCEPHIN) 1 g in sodium chloride 0.9 % 100 mL IVPB (has no administration in time range)  HYDROcodone-acetaminophen (NORCO/VICODIN) 5-325 MG per tablet 1 tablet (1 tablet Oral Given 12/13/23 1227)  sodium chloride 0.9 % bolus 1,000 mL (0 mLs Intravenous Stopped 12/13/23 1038)  ketorolac (TORADOL) 15 MG/ML injection 15 mg (15 mg Intravenous Given 12/13/23 0927)  cefTRIAXone (ROCEPHIN) 1 g in sodium chloride 0.9 % 100 mL IVPB (0 g Intravenous Stopped 12/13/23 1025)    Mobility walks     Focused Assessments Cardiac Assessment Handoff:    Lab Results  Component Value Date   CKTOTAL 158 06/18/2018   TROPONINI <0.03 06/19/2018   No results found for: "DDIMER" Does the Patient currently have chest pain? No   , Neuro Assessment Handoff:  Swallow screen pass?  NA         Neuro Assessment:   Neuro Checks:      Has TPA been given? No If patient is a Neuro Trauma and patient is going to OR before floor call report to 4N Charge nurse: [phone] or [phone]  , R Recommendations: See Admitting Provider Note  Report given to:   Additional Notes:

## 2023-12-13 NOTE — H&P (Signed)
History and Physical    Patient: Cheryl Gill RUE:[id number] DOB: [birth year] DOA: 12/12/2023 DOS: the patient was seen and examined on 12/13/2023 PCP: Fleet Contras, MD  Patient coming from: Home  Chief Complaint:  Chief Complaint  Patient presents with   Back Pain   HPI: Cheryl Gill is a 72 y.o. female with medical history significant of hypertension, hyperlipidemia, diabetes mellitus type 2, CAD s/p CABG, CKD stage IIIb, obesity presents with complaints of persistent left flank pain and urinary discomfort. These symptoms began approximately two weeks prior, at which time she was diagnosed with a bladder infection and prescribed 7-day course of Macrobid which she completed last week.  Despite completing the course of antibiotics, her symptoms did not improve.  Her pain has been severe and persistent. Associated symptoms included urinary frequency nausea, and vomiting.  She denies having any recent fevers, chest pain, shortness of breath, cough, leg swelling, or rash.  In the emergency department patient was noted to be afebrile with blood pressures elevated up to 163/73, and all other vital signs maintained.  Labs significant for WBC 16.6, BUN 29, and creatinine 1.84.  CT renal study noted a small hiatal hernia, cholelithiasis without cholecystitis.  Urinalysis positive for moderate leukocytes, many bacteria, 6-10 squamous epithelial cells and 21-50 WBCs.  Urine culture was collected. Patient had been given 1 L of normal saline IV fluids, ketorolac 15 mg IV, and Rocephin.    Review of Systems: As mentioned in the history of present illness. All other systems reviewed and are negative. Past Medical History:  Diagnosis Date   Asthma    CAD (coronary artery disease)    a. 12/2014: NSTEMI with cath showing Ostial LAD ~95%, mLAD 80-90%; EF ~40-45%, LAD disease compromised major D1 branch. CABG recommended and performed with LIMA-LAD and SVG-D1.    CKD (chronic kidney  disease), stage III (HCC)    Colitis    Diabetes mellitus type 2, insulin dependent (HCC)    Essential hypertension    Hyperlipidemia with target LDL less than 70 01/28/2014   Metabolic syndrome: DM, HTN, Obesity as well as HLD 12/18/2014   NSTEMI (non-ST elevated myocardial infarction) (HCC) 12/18/2014   Echo 1/6: EF 60-65%, no Regional WMA, Gr 1 DD   Obesity    BMI ~35-36   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   LIMA-LAD, SVG-D1   Seasonal allergies    Past Surgical History:  Procedure Laterality Date   ABDOMINAL HYSTERECTOMY     CARDIAC SURGERY     CORONARY ARTERY BYPASS GRAFT N/A 12/21/2014   Procedure: CORONARY ARTERY BYPASS GRAFTING (CABG) TIMES TWO USING LEFT INTERNAL MAMMARY AND RIGHT SAPHENOUS LEG VEIN HARVESTED ENDOSCOPICALLY;  Surgeon: Loreli Slot, MD;  Location: [facility] OR;  Service: Open Heart Surgery;  Laterality: N/A;   LEFT HEART CATHETERIZATION WITH CORONARY ANGIOGRAM N/A 12/18/2014   Procedure: LEFT HEART CATHETERIZATION WITH CORONARY ANGIOGRAM;  Surgeon: Peter M Swaziland, MD;  Location: [facility] CATH LAB;  Service: Cardiovascular;  Laterality: N/A;   NM MYOCAR PERF WALL MOTION  04/09/2010   Abnormal study - appears to be a small area of apical infarction. Reversible ischemia is not seen.   NM MYOVIEW LTD  10/2019   No ischemia or infarction.  Small apical defect likely artifact.  LOW RISK.  EF 60 to 65%.   TEE WITHOUT CARDIOVERSION N/A 12/21/2014   Procedure: TRANSESOPHAGEAL ECHOCARDIOGRAM (TEE);  Surgeon: Loreli Slot, MD;  Location: [facility] OR;  Service: Open Heart Surgery;  Laterality: N/A;  TONSILLECTOMY     TRANSTHORACIC ECHOCARDIOGRAM  12/2014   LVEF 60-65%, normal wall thickness, normal wall motion, diastolic dysfunction, indeterminate LV filling pressure, normal LA size.   Social History:  reports that she has never smoked. She has never used smokeless tobacco. She reports that she does not drink alcohol and does not use drugs.  Allergies  Allergen Reactions   Ace  Inhibitors Anaphylaxis   Lisinopril Anaphylaxis   Augmentin [Amoxicillin-Pot Clavulanate]     "diarrhea"   Mushroom Extract Complex (Do Not Select) Swelling   Tetanus Toxoids Nausea And Vomiting and Swelling    Family History  Problem Relation Age of Onset   Pancreatic cancer Mother    CAD Mother        Open heart surgery 60s-70s   Stroke Father    Heart disease Sister        Unclear details    Prior to Admission medications   Medication Sig Start Date End Date Taking? Authorizing Provider  albuterol (PROVENTIL HFA;VENTOLIN HFA) 108 (90 Base) MCG/ACT inhaler Inhale 2 puffs into the lungs every 4 (four) hours as needed. For shortness of breath or wheezing 01/18/19  Yes Domenick Gong, MD  allopurinol (ZYLOPRIM) 300 MG tablet Take 300 mg by mouth daily.   Yes [provider]  aspirin EC 81 MG tablet Take 81 mg by mouth at bedtime.   Yes [provider]  carboxymethylcellulose 1 % ophthalmic solution Place 1 drop into the left eye at bedtime.   Yes [provider]  cetirizine (ZYRTEC) 10 MG tablet Take 10 mg by mouth daily.   Yes [provider]  colchicine 0.6 MG tablet Take 0.5 tablets (0.3 mg total) by mouth 2 (two) times daily. Patient taking differently: Take 0.3 mg by mouth 2 (two) times daily as needed (gout flares). 06/20/18  Yes Danford, Earl Lites, MD  fluticasone (FLONASE) 50 MCG/ACT nasal spray Place 1 spray into both nostrils daily for 3 days. Patient taking differently: Place 1 spray into both nostrils daily as needed for allergies. 07/25/22 12/13/23 Yes Mound, Acie Fredrickson, FNP  gabapentin (NEURONTIN) 100 MG capsule Take 100 mg by mouth 2 (two) times daily. 11/16/23  Yes [provider]  glipiZIDE (GLUCOTROL) 10 MG tablet Take 10 mg by mouth daily before breakfast.   Yes [provider]  hydrochlorothiazide (HYDRODIURIL) 25 MG tablet Take 25 mg by mouth daily.   Yes [provider]  LANTUS SOLOSTAR 100 UNIT/ML  Solostar Pen Inject 35-70 Units into the skin See admin instructions. Inject 70 units into the skin at bedtime if blood sugar over 200. If blood sugar is over 134, inject 35 units. If blood sugar is lower, do not use. 03/15/18  Yes [provider]  latanoprost (XALATAN) 0.005 % ophthalmic solution Place 1 drop into the right eye at bedtime. 10/07/23  Yes [provider]  metoprolol tartrate (LOPRESSOR) 25 MG tablet Take 1.5 tablets (37.5 mg total) by mouth 2 (two) times daily. Patient taking differently: Take 25 mg by mouth 2 (two) times daily. 08/05/23 12/13/23 Yes Azalee Course, PA  MOUNJARO 5 MG/0.5ML Pen Inject 10 mg into the skin once a week. 01/20/23  Yes [provider]  simvastatin (ZOCOR) 40 MG tablet Take 40 mg by mouth at bedtime. 02/01/18  Yes [provider]  tiZANidine (ZANAFLEX) 4 MG tablet Take 4 mg by mouth 2 (two) times daily as needed. 10/15/23  Yes [provider]  TRELEGY ELLIPTA 100-62.5-25 MCG/ACT AEPB Inhale 1 puff  into the lungs daily. 01/18/23  Yes [provider]  Blood Glucose Monitoring Suppl (TRUE METRIX AIR GLUCOSE METER) w/Device KIT  12/28/21   [provider]  DROPLET PEN NEEDLES 32G X 4 MM MISC  12/28/21   [provider]  nitrofurantoin, macrocrystal-monohydrate, (MACROBID) 100 MG capsule Take 100 mg by mouth 2 (two) times daily. Patient not taking: Reported on 12/13/2023 12/01/23   [provider]  Spacer/Aero-Holding Chambers (AEROCHAMBER PLUS) inhaler Use as instructed 01/18/19   Domenick Gong, MD  TRUE METRIX BLOOD GLUCOSE TEST test strip 1 each 3 (three) times daily. 06/29/23   [provider]  TRUEplus Lancets 33G MISC Apply 1 each topically 3 (three) times daily. 07/02/23   [provider]    Physical Exam: Vitals:   12/13/23 0110 12/13/23 0533 12/13/23 0735 12/13/23 0748  BP: 138/79 (!) 146/78 (!) 163/75   Pulse: 72 71 73   Resp: 16 16 20    Temp: 98 F (36.7 C) 97.8  F (36.6 C) 98.7 F (37.1 C)   TempSrc:   Oral   SpO2: 98% 98% 100%   Weight:    85.3 kg  Height:    5\' 1"  (1.549 m)    Constitutional: Elderly female who appears ill but in no acute distress Eyes: PERRL, lids and conjunctivae normal ENMT: Mucous membranes are moist. Posterior pharynx clear of any exudate or lesions.  Neck: normal, supple, no masses, no thyromegaly Respiratory: clear to auscultation bilaterally, no wheezing, no crackles. Normal respiratory effort. No accessory muscle use.  Cardiovascular: Regular rate and rhythm, no murmurs / rubs / gallops. No extremity edema. Abdomen: Tenderness to palpation of the left flank.  Bowel sounds present in all 4 quadrants.  No guarding present. Musculoskeletal: no clubbing / cyanosis. No joint deformity upper and lower extremities. Good ROM, no contractures. Normal muscle tone.  Skin: no rashes, lesions, ulcers. No induration Neurologic: CN 2-12 grossly intact. Sensation intact, DTR normal. Strength 5/5 in all 4.  Psychiatric: Normal judgment and insight. Alert and oriented x 3. Normal mood.   Data Reviewed:  reviewed labs, imaging, and pertinent records as documented.  Assessment and Plan:  Suspected pyelonephritis Acute.  Patient presents complaints of dysuria and left flank pain.  Recently had been diagnosed with a urinary tract infection treated with nitrofurantoin.  Urinalysis positive for moderate leukocytes, many bacteria, 6-10 squamous epithelial cells/hpf, and 21-50 WBCs.  Patient had been given Rocephin IV. -Admit to MedSurg bed -Follow-up urine culture -Continue empiric antibiotics of Rocephin -Hydrocodone as needed for pain  Acute kidney injury superimposed on chronic kidney disease stage IIIb Creatinine elevated at 1.84 with BUN 29.  Baseline creatinine previously noted to be 1.3 when last checked in August this year.  CT scan of the abdomen pelvis showed no signs of a kidney stone or hydronephrosis. -Hold possible  nephrotoxic agents -Continue normal saline IV fluids at 75 mL/h -Recheck kidney function in a.m.  Essential hypertension Blood pressures currently maintained. -Held hydrochlorothiazide due to AKI -Continue metoprolol  Diabetes mellitus type 2, with long-term use of insulin On admission glucose 149.  Home medication regimen includes glipizide 10 mg daily, Lantus 35 to 70 units nightly as blood sugar, and Mounjaro. -Hypoglycemic protocols -Check hemoglobin A1c -Hold glipizide -Pharmacy substitution of Semglee 35 units nightly -CBGs before every meal with moderate SSI -Adjust insulin regimen as needed  Hyperlipidemia -Continue simvastatin  Obesity BMI 35.52 kg/m  DVT prophylaxis: Lovenox Advance Care Planning:   Code Status: Full Code  Consults: None  Family Communication: Family request to be updated.  Severity of Illness: The appropriate patient status for this patient is INPATIENT. Inpatient status is judged to be reasonable and necessary in order to provide the required intensity of service to ensure the patient's safety. The patient's presenting symptoms, physical exam findings, and initial radiographic and laboratory data in the context of their chronic comorbidities is felt to place them at high risk for further clinical deterioration. Furthermore, it is not anticipated that the patient will be medically stable for discharge from the hospital within 2 midnights of admission.   * I certify that at the point of admission it is my clinical judgment that the patient will require inpatient hospital care spanning beyond 2 midnights from the point of admission due to high intensity of service, high risk for further deterioration and high frequency of surveillance required.*  Author: Clydie Braun, MD 12/13/2023 9:55 AM  For on call review [email or web address].

## 2023-12-14 ENCOUNTER — Encounter (HOSPITAL_COMMUNITY): Payer: Self-pay | Admitting: Internal Medicine

## 2023-12-14 DIAGNOSIS — E66812 Obesity, class 2: Secondary | ICD-10-CM

## 2023-12-14 DIAGNOSIS — E118 Type 2 diabetes mellitus with unspecified complications: Secondary | ICD-10-CM | POA: Diagnosis not present

## 2023-12-14 DIAGNOSIS — N1832 Chronic kidney disease, stage 3b: Secondary | ICD-10-CM

## 2023-12-14 DIAGNOSIS — I10 Essential (primary) hypertension: Secondary | ICD-10-CM | POA: Diagnosis not present

## 2023-12-14 DIAGNOSIS — N179 Acute kidney failure, unspecified: Secondary | ICD-10-CM | POA: Diagnosis not present

## 2023-12-14 DIAGNOSIS — N12 Tubulo-interstitial nephritis, not specified as acute or chronic: Secondary | ICD-10-CM | POA: Diagnosis not present

## 2023-12-14 LAB — BASIC METABOLIC PANEL
Anion gap: 9 (ref 5–15)
BUN: 35 mg/dL — ABNORMAL HIGH (ref 8–23)
CO2: 21 mmol/L — ABNORMAL LOW (ref 22–32)
Calcium: 8.4 mg/dL — ABNORMAL LOW (ref 8.9–10.3)
Chloride: 110 mmol/L (ref 98–111)
Creatinine, Ser: 1.79 mg/dL — ABNORMAL HIGH (ref 0.44–1.00)
GFR, Estimated: 29 mL/min — ABNORMAL LOW (ref >60)
Glucose, Bld: 133 mg/dL — ABNORMAL HIGH (ref 70–99)
Potassium: 4 mmol/L (ref 3.5–5.1)
Sodium: 140 mmol/L (ref 135–145)

## 2023-12-14 LAB — CBC
HCT: 31 % — ABNORMAL LOW (ref 36.0–46.0)
Hemoglobin: 10.3 g/dL — ABNORMAL LOW (ref 12.0–15.0)
MCH: 33.1 pg (ref 26.0–34.0)
MCHC: 33.2 g/dL (ref 30.0–36.0)
MCV: 99.7 fL (ref 80.0–100.0)
Platelets: 198 10*3/uL (ref 150–400)
RBC: 3.11 MIL/uL — ABNORMAL LOW (ref 3.87–5.11)
RDW: 13.2 % (ref 11.5–15.5)
WBC: 11.5 10*3/uL — ABNORMAL HIGH (ref 4.0–10.5)
nRBC: 0 % (ref 0.0–0.2)

## 2023-12-14 LAB — GLUCOSE, CAPILLARY
Glucose-Capillary: 142 mg/dL — ABNORMAL HIGH (ref 70–99)
Glucose-Capillary: 81 mg/dL (ref 70–99)
Glucose-Capillary: 148 mg/dL — ABNORMAL HIGH (ref 70–99)
Glucose-Capillary: 127 mg/dL — ABNORMAL HIGH (ref 70–99)

## 2023-12-14 LAB — URINE CULTURE: Culture: NO GROWTH

## 2023-12-14 LAB — HEMOGLOBIN A1C
Hgb A1c MFr Bld: 6.8 % — ABNORMAL HIGH (ref 4.8–5.6)
Mean Plasma Glucose: 148 mg/dL

## 2023-12-14 LAB — PROCALCITONIN: Procalcitonin: <0.1 ng/mL

## 2023-12-14 MED ORDER — ENOXAPARIN SODIUM 30 MG/0.3ML IJ SOSY
30.0000 mg | PREFILLED_SYRINGE | Freq: Every day | INTRAMUSCULAR | Status: DC
  Administered 2023-12-14 – 2023-12-15 (×2): 30 mg via SUBCUTANEOUS
  Filled 2023-12-14 (×2): qty 0.3

## 2023-12-14 MED ORDER — ORAL CARE MOUTH RINSE: 15.0000 mL | OROMUCOSAL | Status: DC | PRN

## 2023-12-14 NOTE — Assessment & Plan Note (Signed)
 12-14-2023 continue with zocor 40 mg at bedtime.

## 2023-12-14 NOTE — Hospital Course (Signed)
 HPI: Cheryl Gill is a 72 y.o. female with medical history significant of hypertension, hyperlipidemia, diabetes mellitus type 2, CAD s/p CABG, CKD stage IIIb, obesity presents with complaints of persistent left flank pain and urinary discomfort. These symptoms began approximately two weeks prior, at which time she was diagnosed with a bladder infection and prescribed 7-day course of Macrobid  which she completed last week.  Despite completing the course of antibiotics, her symptoms did not improve.  Her pain has been severe and persistent. Associated symptoms included urinary frequency nausea, and vomiting.  She denies having any recent fevers, chest pain, shortness of breath, cough, leg swelling, or rash.   In the emergency department patient was noted to be afebrile with blood pressures elevated up to 163/73, and all other vital signs maintained.  Labs significant for WBC 16.6, BUN 29, and creatinine 1.84.  CT renal study noted a small hiatal hernia, cholelithiasis without cholecystitis.  Urinalysis positive for moderate leukocytes, many bacteria, 6-10 squamous epithelial cells and 21-50 WBCs.  Urine culture was collected. Patient had been given 1 L of normal saline IV fluids, ketorolac  15 mg IV, and Rocephin .    Significant Events: Admitted 12/12/2023 for clinical pyelonephritis   Significant Labs: WBC 16.6, BUN 29, and creatinine 1.84  Urinalysis positive for moderate leukocytes, many bacteria, 6-10 squamous epithelial cells and 21-50 WBCs.   Significant Imaging Studies: CT renal study noted a small hiatal hernia, cholelithiasis without cholecystitis.   Antibiotic Therapy: Anti-infectives (From admission, onward)    Start     Dose/Rate Route Frequency Ordered Stop   12/14/23 0800  cefTRIAXone  (ROCEPHIN ) 1 g in sodium chloride  0.9 % 100 mL IVPB        1 g 200 mL/hr over 30 Minutes Intravenous Every 24 hours 12/13/23 1022     12/13/23 0715  cefTRIAXone  (ROCEPHIN ) 1 g in sodium  chloride 0.9 % 100 mL IVPB        1 g 200 mL/hr over 30 Minutes Intravenous  Once 12/13/23 0706 12/13/23 1025       Procedures:   Consultants:

## 2023-12-14 NOTE — Progress Notes (Addendum)
 PROGRESS NOTE    Cheryl Gill  FMW:[id number] DOB: [birth year] DOA: 12/12/2023 PCP: Shelda Atlas, MD  Subjective: Patient seen and examined.  She states that she was on 3 days of Macrobid  last week.  This ended on Thursday.  She states that her dysuria and urinary frequency never resolved.  After initiation of IV fluids and IV Rocephin , her flank pain is improved.   Hospital Course: HPI: Cheryl Gill is a 72 y.o. female with medical history significant of hypertension, hyperlipidemia, diabetes mellitus type 2, CAD s/p CABG, CKD stage IIIb, obesity presents with complaints of persistent left flank pain and urinary discomfort. These symptoms began approximately two weeks prior, at which time she was diagnosed with a bladder infection and prescribed 7-day course of Macrobid  which she completed last week.  Despite completing the course of antibiotics, her symptoms did not improve.  Her pain has been severe and persistent. Associated symptoms included urinary frequency nausea, and vomiting.  She denies having any recent fevers, chest pain, shortness of breath, cough, leg swelling, or rash.   In the emergency department patient was noted to be afebrile with blood pressures elevated up to 163/73, and all other vital signs maintained.  Labs significant for WBC 16.6, BUN 29, and creatinine 1.84.  CT renal study noted a small hiatal hernia, cholelithiasis without cholecystitis.  Urinalysis positive for moderate leukocytes, many bacteria, 6-10 squamous epithelial cells and 21-50 WBCs.  Urine culture was collected. Patient had been given 1 L of normal saline IV fluids, ketorolac  15 mg IV, and Rocephin .    Significant Events: Admitted 12/12/2023 for clinical pyelonephritis   Significant Labs: WBC 16.6, BUN 29, and creatinine 1.84  Urinalysis positive for moderate leukocytes, many bacteria, 6-10 squamous epithelial cells and 21-50 WBCs.   Significant Imaging Studies: CT renal study  noted a small hiatal hernia, cholelithiasis without cholecystitis.   Antibiotic Therapy: Anti-infectives (From admission, onward)    Start     Dose/Rate Route Frequency Ordered Stop   12/14/23 0800  cefTRIAXone  (ROCEPHIN ) 1 g in sodium chloride  0.9 % 100 mL IVPB        1 g 200 mL/hr over 30 Minutes Intravenous Every 24 hours 12/13/23 1022     12/13/23 0715  cefTRIAXone  (ROCEPHIN ) 1 g in sodium chloride  0.9 % 100 mL IVPB        1 g 200 mL/hr over 30 Minutes Intravenous  Once 12/13/23 0706 12/13/23 1025       Procedures:   Consultants:     Assessment and Plan: * Pyelonephritis 12-14-2023 continue with IV rocephin . Awaiting urine cx. Appears pt failed outpatient treatment with macrobid .  Acute kidney injury superimposed on stage 3b chronic kidney disease (HCC) - baseline Scr 1.4-1.7 12-14-2023 pt without N/V. Continue with po liquids. Hold nephrotoxic agents  Type 2 diabetes mellitus with complication, with long-term current use of insulin  (HCC) 12-14-2023 continue with lantus  and SSI. A1c 6.8% indicating decent/good outpatient control of her diabetes.  Obesity, Class II, BMI 35-39.9 BMI 35.52  Hyperlipidemia associated with type 2 diabetes mellitus (HCC) 12-14-2023 continue with zocor  40 mg at bedtime.  Essential hypertension 12-14-2023 continue with lopressor  bid. Prn hydralazine . Hold HCZT due to AKI.   DVT prophylaxis: enoxaparin  (LOVENOX ) injection 30 mg Start: 12/14/23 1600    Code Status: Full Code Family Communication: no family at bedside. Pt is decisional Disposition Plan: return home Reason for continuing need for hospitalization: remains on IV ABX.  Objective: Vitals:   12/13/23 2036 12/14/23 0008 12/14/23  0423 12/14/23 0749  BP: 118/76 114/63 127/67 107/60  Pulse: 83 72 82 76  Resp: 18 18  17   Temp: (!) 97.4 F (36.3 C) (!) 97.5 F (36.4 C) 97.8 F (36.6 C) 97.8 F (36.6 C)  TempSrc: Oral Oral Oral Oral  SpO2: 94% 99% 98% 99%  Weight:       Height:        Intake/Output Summary (Last 24 hours) at 12/14/2023 1330 Last data filed at 12/14/2023 0750 Gross per 24 hour  Intake 360 ml  Output --  Net 360 ml   Filed Weights   12/13/23 0748  Weight: 85.3 kg    Examination:  Physical Exam Vitals and nursing note reviewed.  Constitutional:      General: She is not in acute distress.    Appearance: She is obese. She is not toxic-appearing or diaphoretic.  HENT:     Head: Normocephalic and atraumatic.     Nose: Nose normal.  Eyes:     General: No scleral icterus. Cardiovascular:     Rate and Rhythm: Normal rate and regular rhythm.  Pulmonary:     Effort: Pulmonary effort is normal.     Breath sounds: Normal breath sounds.  Abdominal:     General: Bowel sounds are normal. There is no distension.     Palpations: Abdomen is soft.  Musculoskeletal:     Right lower leg: No edema.     Left lower leg: No edema.  Skin:    General: Skin is warm and dry.     Capillary Refill: Capillary refill takes less than 2 seconds.  Neurological:     General: No focal deficit present.     Mental Status: She is alert and oriented to person, place, and time.     Data Reviewed: I have personally reviewed following labs and imaging studies  CBC: Recent Labs  Lab 12/12/23 1925 12/14/23 0313  WBC 16.6* 11.5*  NEUTROABS 9.7*  --   HGB 12.0 10.3*  HCT 36.2 31.0*  MCV 100.6* 99.7  PLT 232 198   Basic Metabolic Panel: Recent Labs  Lab 12/12/23 1925 12/14/23 0313  NA 143 140  K 3.8 4.0  CL 108 110  CO2 22 21*  GLUCOSE 149* 133*  BUN 29* 35*  CREATININE 1.84* 1.79*  CALCIUM  9.0 8.4*   GFR: Estimated Creatinine Clearance: 27.3 mL/min (A) (by C-G formula based on SCr of 1.79 mg/dL (H)). Liver Function Tests: Recent Labs  Lab 12/12/23 1925  AST 25  ALT 20  ALKPHOS 80  BILITOT 0.3  PROT 7.3  ALBUMIN  3.5   HbA1C: Recent Labs    12/12/23 1925  HGBA1C 6.8*   CBG: Recent Labs  Lab 12/13/23 1739 12/13/23 2038  12/14/23 0750 12/14/23 1159  GLUCAP 167* 178* 142* 81   Sepsis Labs: Recent Labs  Lab 12/14/23 0313  PROCALCITON <0.10    Recent Results (from the past 240 hours)  Urine Culture     Status: None   Collection Time: 12/13/23  7:36 AM   Specimen: Urine, Clean Catch  Result Value Ref Range Status   Specimen Description URINE, CLEAN CATCH  Final   Special Requests NONE  Final   Culture   Final    NO GROWTH Performed at [facility], [address], [place], [place] [address]    Report Status 12/14/2023 FINAL  Final     Radiology Studies: CT Renal Stone Study Result Date: 12/13/2023 CLINICAL DATA:  Abdominal/flank pain, stone  suspected Left flank pain EXAM: CT ABDOMEN AND PELVIS WITHOUT CONTRAST TECHNIQUE: Multidetector CT imaging of the abdomen and pelvis was performed following the standard protocol without IV contrast. RADIATION DOSE REDUCTION: This exam was performed according to the departmental dose-optimization program which includes automated exposure control, adjustment of the mA and/or kV according to patient size and/or use of iterative reconstruction technique. COMPARISON:  CT abdomen 05/12/2018 FINDINGS: Lower chest: No acute abnormality.  Small hiatal hernia. Hepatobiliary: No focal liver abnormality. Layering hyperdensity within the gallbladder lumen suggestive of cholelithiasis versus gallbladder sludge. No gallbladder wall thickening or pericholecystic fluid. No biliary dilatation. Pancreas: No focal lesion. Normal pancreatic contour. No surrounding inflammatory changes. No main pancreatic ductal dilatation. Spleen: Normal in size without focal abnormality. Adrenals/Urinary Tract: No adrenal nodule bilaterally. No nephrolithiasis and no hydronephrosis. No definite contour-deforming renal mass. No ureterolithiasis or hydroureter. The urinary bladder is unremarkable. Stomach/Bowel: Stomach is within normal limits. No evidence of bowel wall thickening or dilatation. Colonic  diverticulosis. The appendix is not definitely identified with no inflammatory changes in the right lower quadrant to suggest acute appendicitis. Vascular/Lymphatic: No abdominal aorta or iliac aneurysm. Mild atherosclerotic plaque of the aorta and its branches. No abdominal, pelvic, or inguinal lymphadenopathy. Reproductive: Status post hysterectomy. No adnexal masses. Other: No intraperitoneal free fluid. No intraperitoneal free gas. No organized fluid collection. Musculoskeletal: No abdominal wall hernia or abnormality. Healed anterior abdominal incision. No suspicious lytic or blastic osseous lesions. No acute displaced fracture. IMPRESSION: 1. Small hiatal hernia. 2. Cholelithiasis versus gallbladder sludge with no CT evidence of acute cholecystitis. 3. Colonic diverticulosis with no acute diverticulitis 4. Aortic Atherosclerosis (ICD10-I70.0). Electronically Signed   By: Morgane  Naveau M.D.   On: 12/13/2023 01:55    Scheduled Meds:  allopurinol   300 mg Oral Daily   aspirin  EC  81 mg Oral Daily   enoxaparin  (LOVENOX ) injection  30 mg Subcutaneous Daily   gabapentin   100 mg Oral BID   insulin  aspart  0-15 Units Subcutaneous TID WC   insulin  glargine-yfgn  35 Units Subcutaneous QHS   latanoprost   1 drop Right Eye QHS   loratadine   10 mg Oral Daily   metoprolol  tartrate  25 mg Oral BID   polyvinyl alcohol   1 drop Left Eye QHS   simvastatin   40 mg Oral QHS   sodium chloride  flush  3 mL Intravenous Q12H   Continuous Infusions:  cefTRIAXone  (ROCEPHIN )  IV 1 g (12/14/23 0853)     LOS: 1 day   Time spent: 40 minutes  Camellia Door, DO  [facility]  12/14/2023, 1:30 PM

## 2023-12-14 NOTE — Assessment & Plan Note (Addendum)
 12-14-2023 continue with lantus and SSI. A1c 6.8% indicating decent/good outpatient control of her diabetes. 12-15-2023 CBG in acceptable ranges

## 2023-12-14 NOTE — Assessment & Plan Note (Addendum)
 12-14-2023 pt without N/V. Continue with po liquids. Hold nephrotoxic agents  12-15-2023 awaiting BMP today 12-16-2023 Scr stable at 1.8. likely due to pyelonephritis. Pt is urinating well. Will need repeat BMP in PCP f/u appointment. Hold hydrochlorothiazide  and colchicine  until seen.

## 2023-12-14 NOTE — Assessment & Plan Note (Addendum)
 12-14-2023 continue with lopressor  bid. Prn hydralazine . Hold HCTZ due to AKI. 12-15-2023 stable. Continue to hold hydrochlorothiazide . Awaiting BMP 12-16-2023 hold hydrochlorothiazide  at discharge. Will need repeat BMP at PCP f/u appointment. Continue lopressor  bid

## 2023-12-14 NOTE — Subjective & Objective (Addendum)
 Patient seen and examined. Remains afebrile. PIV not working this AM. Pt does not want it restarted. Will not get IV rocephin today. Pt understands she needs to start po abx today. Ready for DC Discussed with pt's dtr [facility] via phone.

## 2023-12-14 NOTE — Assessment & Plan Note (Addendum)
 12-14-2023 continue with IV rocephin . Awaiting urine cx. Appears pt failed outpatient treatment with macrobid .  12-14-2022 urine cx negative. Today is Day #3 IV Rocephin . Monitor overnight and potential DC in AM after 4th dose of IV rocephin . 12-16-2023 PIV not working today. Has completed 3 days of IV rocephin . DC to home with Duricef 500 mg bid x 11 days. Pt to start po duricef today. F/u with PCP in 1-2 weeks.

## 2023-12-14 NOTE — Assessment & Plan Note (Signed)
 BMI 35.52

## 2023-12-15 DIAGNOSIS — E118 Type 2 diabetes mellitus with unspecified complications: Secondary | ICD-10-CM | POA: Diagnosis not present

## 2023-12-15 DIAGNOSIS — I10 Essential (primary) hypertension: Secondary | ICD-10-CM | POA: Diagnosis not present

## 2023-12-15 DIAGNOSIS — N179 Acute kidney failure, unspecified: Secondary | ICD-10-CM | POA: Diagnosis not present

## 2023-12-15 DIAGNOSIS — N12 Tubulo-interstitial nephritis, not specified as acute or chronic: Secondary | ICD-10-CM | POA: Diagnosis not present

## 2023-12-15 LAB — GLUCOSE, CAPILLARY
Glucose-Capillary: 158 mg/dL — ABNORMAL HIGH (ref 70–99)
Glucose-Capillary: 127 mg/dL — ABNORMAL HIGH (ref 70–99)
Glucose-Capillary: 147 mg/dL — ABNORMAL HIGH (ref 70–99)
Glucose-Capillary: 132 mg/dL — ABNORMAL HIGH (ref 70–99)

## 2023-12-15 LAB — COMPREHENSIVE METABOLIC PANEL
ALT: 19 U/L (ref 0–44)
AST: 24 U/L (ref 15–41)
Albumin: 3.2 g/dL — ABNORMAL LOW (ref 3.5–5.0)
Alkaline Phosphatase: 88 U/L (ref 38–126)
Anion gap: 10 (ref 5–15)
BUN: 40 mg/dL — ABNORMAL HIGH (ref 8–23)
CO2: 19 mmol/L — ABNORMAL LOW (ref 22–32)
Calcium: 8.6 mg/dL — ABNORMAL LOW (ref 8.9–10.3)
Chloride: 110 mmol/L (ref 98–111)
Creatinine, Ser: 1.83 mg/dL — ABNORMAL HIGH (ref 0.44–1.00)
GFR, Estimated: 29 mL/min — ABNORMAL LOW (ref >60)
Glucose, Bld: 126 mg/dL — ABNORMAL HIGH (ref 70–99)
Potassium: 4 mmol/L (ref 3.5–5.1)
Sodium: 139 mmol/L (ref 135–145)
Total Bilirubin: 0.2 mg/dL (ref 0.0–1.2)
Total Protein: 6.7 g/dL (ref 6.5–8.1)

## 2023-12-15 LAB — CBC WITH DIFFERENTIAL/PLATELET
Abs Immature Granulocytes: 0.03 10*3/uL (ref 0.00–0.07)
Basophils Absolute: 0.1 10*3/uL (ref 0.0–0.1)
Basophils Relative: 1 %
Eosinophils Absolute: 0.7 10*3/uL — ABNORMAL HIGH (ref 0.0–0.5)
Eosinophils Relative: 6 %
HCT: 35.2 % — ABNORMAL LOW (ref 36.0–46.0)
Hemoglobin: 11.7 g/dL — ABNORMAL LOW (ref 12.0–15.0)
Immature Granulocytes: 0 %
Lymphocytes Relative: 43 %
Lymphs Abs: 5.2 10*3/uL — ABNORMAL HIGH (ref 0.7–4.0)
MCH: 32.9 pg (ref 26.0–34.0)
MCHC: 33.2 g/dL (ref 30.0–36.0)
MCV: 98.9 fL (ref 80.0–100.0)
Monocytes Absolute: 0.7 10*3/uL (ref 0.1–1.0)
Monocytes Relative: 6 %
Neutro Abs: 5.3 10*3/uL (ref 1.7–7.7)
Neutrophils Relative %: 44 %
Platelets: 223 10*3/uL (ref 150–400)
RBC: 3.56 MIL/uL — ABNORMAL LOW (ref 3.87–5.11)
RDW: 13.1 % (ref 11.5–15.5)
WBC: 12 10*3/uL — ABNORMAL HIGH (ref 4.0–10.5)
nRBC: 0 % (ref 0.0–0.2)

## 2023-12-15 LAB — PROCALCITONIN: Procalcitonin: <0.1 ng/mL

## 2023-12-15 NOTE — Progress Notes (Signed)
 PROGRESS NOTE    Cheryl Gill  FMW:[id number] DOB: 1951-04-21 DOA: 12/12/2023 PCP: Shelda Atlas, MD  Subjective: Patient seen and examined. Afebrile. Urine cx negative. Pt still with mild left CVA tenderness. Urinary frequency has resolved. Pt wants to stay one more night for IV ABX. Discussed she will need 10-14 days of total abx course for presume pyelonephritis.   Hospital Course: HPI: Cheryl Gill is a 73 y.o. female with medical history significant of hypertension, hyperlipidemia, diabetes mellitus type 2, CAD s/p CABG, CKD stage IIIb, obesity presents with complaints of persistent left flank pain and urinary discomfort. These symptoms began approximately two weeks prior, at which time she was diagnosed with a bladder infection and prescribed 7-day course of Macrobid  which she completed last week.  Despite completing the course of antibiotics, her symptoms did not improve.  Her pain has been severe and persistent. Associated symptoms included urinary frequency nausea, and vomiting.  She denies having any recent fevers, chest pain, shortness of breath, cough, leg swelling, or rash.   In the emergency department patient was noted to be afebrile with blood pressures elevated up to 163/73, and all other vital signs maintained.  Labs significant for WBC 16.6, BUN 29, and creatinine 1.84.  CT renal study noted a small hiatal hernia, cholelithiasis without cholecystitis.  Urinalysis positive for moderate leukocytes, many bacteria, 6-10 squamous epithelial cells and 21-50 WBCs.  Urine culture was collected. Patient had been given 1 L of normal saline IV fluids, ketorolac  15 mg IV, and Rocephin .    Significant Events: Admitted 12/12/2023 for clinical pyelonephritis   Significant Labs: WBC 16.6, BUN 29, and creatinine 1.84  Urinalysis positive for moderate leukocytes, many bacteria, 6-10 squamous epithelial cells and 21-50 WBCs.   Significant Imaging Studies: CT renal  study noted a small hiatal hernia, cholelithiasis without cholecystitis.   Antibiotic Therapy: Anti-infectives (From admission, onward)    Start     Dose/Rate Route Frequency Ordered Stop   12/14/23 0800  cefTRIAXone  (ROCEPHIN ) 1 g in sodium chloride  0.9 % 100 mL IVPB        1 g 200 mL/hr over 30 Minutes Intravenous Every 24 hours 12/13/23 1022     12/13/23 0715  cefTRIAXone  (ROCEPHIN ) 1 g in sodium chloride  0.9 % 100 mL IVPB        1 g 200 mL/hr over 30 Minutes Intravenous  Once 12/13/23 0706 12/13/23 1025       Procedures:   Consultants:     Assessment and Plan: * Pyelonephritis 12-14-2023 continue with IV rocephin . Awaiting urine cx. Appears pt failed outpatient treatment with macrobid .  12-14-2022 urine cx negative. Today is Day #3 IV Rocephin . Monitor overnight and potential DC in AM after 4th dose of IV rocephin .  Acute kidney injury superimposed on stage 3b chronic kidney disease (HCC) - baseline Scr 1.4-1.7 12-14-2023 pt without N/V. Continue with po liquids. Hold nephrotoxic agents  12-14-2022 awaiting BMP today  Type 2 diabetes mellitus with complication, with long-term current use of insulin  (HCC) 12-14-2023 continue with lantus  and SSI. A1c 6.8% indicating decent/good outpatient control of her diabetes. 12-15-2023 CBG in acceptable ranges  Obesity, Class II, BMI 35-39.9 BMI 35.52  Hyperlipidemia associated with type 2 diabetes mellitus (HCC) 12-14-2023 continue with zocor  40 mg at bedtime.  Essential hypertension 12-14-2023 continue with lopressor  bid. Prn hydralazine . Hold HCTZ due to AKI. 12-15-2023 stable. Continue to hold hydrochlorothiazide . Awaiting BMP       DVT prophylaxis: enoxaparin  (LOVENOX ) injection 30 mg Start: 12/14/23  1600    Code Status: Full Code Family Communication: no family at bedside. Pt is decisional Disposition Plan: return home Reason for continuing need for hospitalization: remains on IV ABX.  Objective: Vitals:    12/14/23 2256 12/15/23 0436 12/15/23 0726 12/15/23 0905  BP: (!) 145/75 106/71 139/78 139/78  Pulse: 73 73 76 76  Resp: 16 17 16    Temp: 98.1 F (36.7 C) 98.3 F (36.8 C) 98.1 F (36.7 C)   TempSrc: Oral Oral Oral   SpO2: 100% 100% 100%   Weight:      Height:        Intake/Output Summary (Last 24 hours) at 12/15/2023 0947 Last data filed at 12/15/2023 0600 Gross per 24 hour  Intake 917.28 ml  Output --  Net 917.28 ml   Filed Weights   12/13/23 0748  Weight: 85.3 kg    Examination:  Physical Exam Vitals and nursing note reviewed.  Constitutional:      Appearance: She is obese.  HENT:     Head: Normocephalic and atraumatic.     Nose: Nose normal.  Eyes:     General: No scleral icterus. Cardiovascular:     Rate and Rhythm: Normal rate and regular rhythm.  Pulmonary:     Effort: Pulmonary effort is normal.     Breath sounds: Normal breath sounds.  Abdominal:     General: Bowel sounds are normal. There is no distension.     Palpations: Abdomen is soft.     Tenderness: There is left CVA tenderness.  Musculoskeletal:     Right lower leg: No edema.     Left lower leg: No edema.  Skin:    General: Skin is warm and dry.     Capillary Refill: Capillary refill takes less than 2 seconds.  Neurological:     General: No focal deficit present.     Mental Status: She is alert and oriented to person, place, and time.     Data Reviewed: I have personally reviewed following labs and imaging studies  CBC: Recent Labs  Lab 12/12/23 1925 12/14/23 0313 12/15/23 0814  WBC 16.6* 11.5* 12.0*  NEUTROABS 9.7*  --  5.3  HGB 12.0 10.3* 11.7*  HCT 36.2 31.0* 35.2*  MCV 100.6* 99.7 98.9  PLT 232 198 223   Basic Metabolic Panel: Recent Labs  Lab 12/12/23 1925 12/14/23 0313  NA 143 140  K 3.8 4.0  CL 108 110  CO2 22 21*  GLUCOSE 149* 133*  BUN 29* 35*  CREATININE 1.84* 1.79*  CALCIUM  9.0 8.4*   GFR: Estimated Creatinine Clearance: 27.3 mL/min (A) (by C-G formula based  on SCr of 1.79 mg/dL (H)). Liver Function Tests: Recent Labs  Lab 12/12/23 1925  AST 25  ALT 20  ALKPHOS 80  BILITOT 0.3  PROT 7.3  ALBUMIN  3.5   HbA1C: Recent Labs    12/12/23 1925  HGBA1C 6.8*   CBG: Recent Labs  Lab 12/14/23 0750 12/14/23 1159 12/14/23 1651 12/14/23 2204 12/15/23 0903  GLUCAP 142* 81 148* 127* 158*   Sepsis Labs: Recent Labs  Lab 12/14/23 0313  PROCALCITON <0.10    Recent Results (from the past 240 hours)  Urine Culture     Status: None   Collection Time: 12/13/23  7:36 AM   Specimen: Urine, Clean Catch  Result Value Ref Range Status   Specimen Description URINE, CLEAN CATCH  Final   Special Requests NONE  Final   Culture   Final    NO  GROWTH Performed at [facility], [address], [place], [place] [address]    Report Status 12/14/2023 FINAL  Final     Radiology Studies: No results found.  Scheduled Meds:  allopurinol   300 mg Oral Daily   aspirin  EC  81 mg Oral Daily   enoxaparin  (LOVENOX ) injection  30 mg Subcutaneous Daily   gabapentin   100 mg Oral BID   insulin  aspart  0-15 Units Subcutaneous TID WC   insulin  glargine-yfgn  35 Units Subcutaneous QHS   latanoprost   1 drop Right Eye QHS   loratadine   10 mg Oral Daily   metoprolol  tartrate  25 mg Oral BID   polyvinyl alcohol   1 drop Left Eye QHS   simvastatin   40 mg Oral QHS   sodium chloride  flush  3 mL Intravenous Q12H   Continuous Infusions:  cefTRIAXone  (ROCEPHIN )  IV 1 g (12/15/23 0909)     LOS: 2 days   Time spent: 40 minutes  Camellia Door, DO  [facility]  12/15/2023, 9:47 AM

## 2023-12-15 NOTE — Plan of Care (Signed)
 Patient alert and oriented. Ambulating in halls independently. Medicated for flank pain with positive effect. Will continue to monitor.   Problem: Education: Goal: Knowledge of General Education information will improve Description: Including pain rating scale, medication(s)/side effects and non-pharmacologic comfort measures Outcome: Progressing   Problem: Health Behavior/Discharge Planning: Goal: Ability to manage health-related needs will improve Outcome: Progressing   Problem: Clinical Measurements: Goal: Ability to maintain clinical measurements within normal limits will improve Outcome: Progressing Goal: Will remain free from infection Outcome: Progressing Goal: Diagnostic test results will improve Outcome: Progressing Goal: Respiratory complications will improve Outcome: Progressing Goal: Cardiovascular complication will be avoided Outcome: Progressing   Problem: Activity: Goal: Risk for activity intolerance will decrease Outcome: Progressing   Problem: Nutrition: Goal: Adequate nutrition will be maintained Outcome: Progressing   Problem: Coping: Goal: Level of anxiety will decrease Outcome: Progressing   Problem: Elimination: Goal: Will not experience complications related to bowel motility Outcome: Progressing Goal: Will not experience complications related to urinary retention Outcome: Progressing   Problem: Pain Management: Goal: General experience of comfort will improve Outcome: Progressing   Problem: Safety: Goal: Ability to remain free from injury will improve Outcome: Progressing   Problem: Skin Integrity: Goal: Risk for impaired skin integrity will decrease Outcome: Progressing   Problem: Education: Goal: Ability to describe self-care measures that may prevent or decrease complications (Diabetes Survival Skills Education) will improve Outcome: Progressing Goal: Individualized Educational Video(s) Outcome: Progressing   Problem:  Coping: Goal: Ability to adjust to condition or change in health will improve Outcome: Progressing   Problem: Fluid Volume: Goal: Ability to maintain a balanced intake and output will improve Outcome: Progressing   Problem: Health Behavior/Discharge Planning: Goal: Ability to identify and utilize available resources and services will improve Outcome: Progressing Goal: Ability to manage health-related needs will improve Outcome: Progressing   Problem: Metabolic: Goal: Ability to maintain appropriate glucose levels will improve Outcome: Progressing   Problem: Nutritional: Goal: Maintenance of adequate nutrition will improve Outcome: Progressing Goal: Progress toward achieving an optimal weight will improve Outcome: Progressing   Problem: Skin Integrity: Goal: Risk for impaired skin integrity will decrease Outcome: Progressing   Problem: Tissue Perfusion: Goal: Adequacy of tissue perfusion will improve Outcome: Progressing

## 2023-12-16 DIAGNOSIS — E118 Type 2 diabetes mellitus with unspecified complications: Secondary | ICD-10-CM | POA: Diagnosis not present

## 2023-12-16 DIAGNOSIS — N179 Acute kidney failure, unspecified: Secondary | ICD-10-CM | POA: Diagnosis not present

## 2023-12-16 DIAGNOSIS — I10 Essential (primary) hypertension: Secondary | ICD-10-CM | POA: Diagnosis not present

## 2023-12-16 DIAGNOSIS — N12 Tubulo-interstitial nephritis, not specified as acute or chronic: Secondary | ICD-10-CM | POA: Diagnosis not present

## 2023-12-16 LAB — COMPREHENSIVE METABOLIC PANEL
ALT: 25 U/L (ref 0–44)
AST: 34 U/L (ref 15–41)
Albumin: 2.8 g/dL — ABNORMAL LOW (ref 3.5–5.0)
Alkaline Phosphatase: 71 U/L (ref 38–126)
Anion gap: 10 (ref 5–15)
BUN: 39 mg/dL — ABNORMAL HIGH (ref 8–23)
CO2: 20 mmol/L — ABNORMAL LOW (ref 22–32)
Calcium: 8.6 mg/dL — ABNORMAL LOW (ref 8.9–10.3)
Chloride: 109 mmol/L (ref 98–111)
Creatinine, Ser: 1.95 mg/dL — ABNORMAL HIGH (ref 0.44–1.00)
GFR, Estimated: 27 mL/min — ABNORMAL LOW (ref >60)
Glucose, Bld: 132 mg/dL — ABNORMAL HIGH (ref 70–99)
Potassium: 4.3 mmol/L (ref 3.5–5.1)
Sodium: 139 mmol/L (ref 135–145)
Total Bilirubin: 0.3 mg/dL (ref 0.0–1.2)
Total Protein: 6.1 g/dL — ABNORMAL LOW (ref 6.5–8.1)

## 2023-12-16 LAB — CBC WITH DIFFERENTIAL/PLATELET
Abs Immature Granulocytes: 0.03 10*3/uL (ref 0.00–0.07)
Basophils Absolute: 0.1 10*3/uL (ref 0.0–0.1)
Basophils Relative: 0 %
Eosinophils Absolute: 0.6 10*3/uL — ABNORMAL HIGH (ref 0.0–0.5)
Eosinophils Relative: 5 %
HCT: 31.6 % — ABNORMAL LOW (ref 36.0–46.0)
Hemoglobin: 10.7 g/dL — ABNORMAL LOW (ref 12.0–15.0)
Immature Granulocytes: 0 %
Lymphocytes Relative: 38 %
Lymphs Abs: 4.6 10*3/uL — ABNORMAL HIGH (ref 0.7–4.0)
MCH: 33.4 pg (ref 26.0–34.0)
MCHC: 33.9 g/dL (ref 30.0–36.0)
MCV: 98.8 fL (ref 80.0–100.0)
Monocytes Absolute: 0.8 10*3/uL (ref 0.1–1.0)
Monocytes Relative: 7 %
Neutro Abs: 6 10*3/uL (ref 1.7–7.7)
Neutrophils Relative %: 50 %
Platelets: 205 10*3/uL (ref 150–400)
RBC: 3.2 MIL/uL — ABNORMAL LOW (ref 3.87–5.11)
RDW: 12.9 % (ref 11.5–15.5)
WBC: 12.1 10*3/uL — ABNORMAL HIGH (ref 4.0–10.5)
nRBC: 0 % (ref 0.0–0.2)

## 2023-12-16 LAB — GLUCOSE, CAPILLARY: Glucose-Capillary: 141 mg/dL — ABNORMAL HIGH (ref 70–99)

## 2023-12-16 MED ORDER — CEFADROXIL 500 MG PO CAPS: 500.0000 mg | ORAL_CAPSULE | Freq: Two times a day (BID) | ORAL | 0 refills | Status: AC

## 2023-12-16 NOTE — Care Management (Signed)
  Transition of Care [facility]) Screening Note   Patient Details  Name: Cheryl Gill Date of Birth: [birth year]   Transition of Care [facility]) CM/SW Contact:    Corean JAYSON Canary, RN Phone Number: 12/16/2023, 10:56 AM    Transition of Care Department [facility]) has reviewed patient and no TOC needs have been identified at this time. We will continue to monitor patient advancement through interdisciplinary progression rounds. If new patient transition needs arise, please place a TOC consult.

## 2023-12-16 NOTE — Progress Notes (Signed)
 PROGRESS NOTE    Cheryl Gill  FMW:[id number] DOB: [birth year] DOA: 12/12/2023 PCP: Shelda Atlas, MD  Subjective: Patient seen and examined. Remains afebrile. PIV not working this AM. Pt does not want it restarted. Will not get IV rocephin  today. Pt understands she needs to start po abx today. Ready for DC Discussed with pt's dtr [facility] via phone.   Hospital Course: HPI: Cheryl Gill is a 73 y.o. female with medical history significant of hypertension, hyperlipidemia, diabetes mellitus type 2, CAD s/p CABG, CKD stage IIIb, obesity presents with complaints of persistent left flank pain and urinary discomfort. These symptoms began approximately two weeks prior, at which time she was diagnosed with a bladder infection and prescribed 7-day course of Macrobid  which she completed last week.  Despite completing the course of antibiotics, her symptoms did not improve.  Her pain has been severe and persistent. Associated symptoms included urinary frequency nausea, and vomiting.  She denies having any recent fevers, chest pain, shortness of breath, cough, leg swelling, or rash.   In the emergency department patient was noted to be afebrile with blood pressures elevated up to 163/73, and all other vital signs maintained.  Labs significant for WBC 16.6, BUN 29, and creatinine 1.84.  CT renal study noted a small hiatal hernia, cholelithiasis without cholecystitis.  Urinalysis positive for moderate leukocytes, many bacteria, 6-10 squamous epithelial cells and 21-50 WBCs.  Urine culture was collected. Patient had been given 1 L of normal saline IV fluids, ketorolac  15 mg IV, and Rocephin .    Significant Events: Admitted 12/12/2023 for clinical pyelonephritis   Significant Labs: WBC 16.6, BUN 29, and creatinine 1.84  Urinalysis positive for moderate leukocytes, many bacteria, 6-10 squamous epithelial cells and 21-50 WBCs.   Significant Imaging Studies: CT renal study noted a small  hiatal hernia, cholelithiasis without cholecystitis.   Antibiotic Therapy: Anti-infectives (From admission, onward)    Start     Dose/Rate Route Frequency Ordered Stop   12/14/23 0800  cefTRIAXone  (ROCEPHIN ) 1 g in sodium chloride  0.9 % 100 mL IVPB        1 g 200 mL/hr over 30 Minutes Intravenous Every 24 hours 12/13/23 1022     12/13/23 0715  cefTRIAXone  (ROCEPHIN ) 1 g in sodium chloride  0.9 % 100 mL IVPB        1 g 200 mL/hr over 30 Minutes Intravenous  Once 12/13/23 0706 12/13/23 1025       Procedures:   Consultants:     Assessment and Plan: * Pyelonephritis 12-14-2023 continue with IV rocephin . Awaiting urine cx. Appears pt failed outpatient treatment with macrobid .  12-14-2022 urine cx negative. Today is Day #3 IV Rocephin . Monitor overnight and potential DC in AM after 4th dose of IV rocephin . 12-16-2023 PIV not working today. Has completed 3 days of IV rocephin . DC to home with Duricef 500 mg bid x 11 days. Pt to start po duricef today. F/u with PCP in 1-2 weeks.  Acute kidney injury superimposed on stage 3b chronic kidney disease (HCC) - baseline Scr 1.4-1.7 12-14-2023 pt without N/V. Continue with po liquids. Hold nephrotoxic agents  12-15-2023 awaiting BMP today 12-16-2023 Scr stable at 1.8. likely due to pyelonephritis. Pt is urinating well. Will need repeat BMP in PCP f/u appointment. Hold hydrochlorothiazide  and colchicine  until seen.  Type 2 diabetes mellitus with complication, with long-term current use of insulin  (HCC) 12-14-2023 continue with lantus  and SSI. A1c 6.8% indicating decent/good outpatient control of her diabetes. 12-15-2023 CBG in acceptable ranges  Obesity, Class II, BMI 35-39.9 BMI 35.52  Hyperlipidemia associated with type 2 diabetes mellitus (HCC) 12-14-2023 continue with zocor  40 mg at bedtime.  Essential hypertension 12-14-2023 continue with lopressor  bid. Prn hydralazine . Hold HCTZ due to AKI. 12-15-2023 stable. Continue to hold  hydrochlorothiazide . Awaiting BMP 12-16-2023 hold hydrochlorothiazide  at discharge. Will need repeat BMP at PCP f/u appointment. Continue lopressor  bid   DVT prophylaxis: enoxaparin  (LOVENOX ) injection 30 mg Start: 12/14/23 1600     Code Status: Full Code Family Communication: discussed with pt. and with dtr nicky via phone Disposition Plan: return home Reason for continuing need for hospitalization: medically stable for DC.  Objective: Vitals:   12/15/23 2150 12/16/23 0519 12/16/23 0804 12/16/23 0836  BP: 118/68 137/76 (!) 144/83 (!) 144/83  Pulse: 69 83 70 70  Resp: 19 18 19    Temp: 98.5 F (36.9 C) 98.3 F (36.8 C) 98.5 F (36.9 C)   TempSrc: Oral Oral Oral   SpO2: 96%  97%   Weight:      Height:        Intake/Output Summary (Last 24 hours) at 12/16/2023 1018 Last data filed at 12/15/2023 1504 Gross per 24 hour  Intake 100 ml  Output --  Net 100 ml   Filed Weights   12/13/23 0748  Weight: 85.3 kg    Examination:  Physical Exam Vitals and nursing note reviewed.  Constitutional:      General: She is not in acute distress.    Appearance: She is obese. She is not toxic-appearing or diaphoretic.  HENT:     Head: Normocephalic and atraumatic.     Nose: Nose normal.  Cardiovascular:     Rate and Rhythm: Normal rate and regular rhythm.  Pulmonary:     Effort: Pulmonary effort is normal.     Breath sounds: Normal breath sounds.  Abdominal:     General: Bowel sounds are normal.  Musculoskeletal:     Right lower leg: No edema.     Left lower leg: No edema.  Skin:    General: Skin is warm and dry.     Capillary Refill: Capillary refill takes less than 2 seconds.  Neurological:     General: No focal deficit present.     Mental Status: She is alert and oriented to person, place, and time.     Data Reviewed: I have personally reviewed following labs and imaging studies  CBC: Recent Labs  Lab 12/12/23 1925 12/14/23 0313 12/15/23 0814 12/16/23 0346  WBC  16.6* 11.5* 12.0* 12.1*  NEUTROABS 9.7*  --  5.3 6.0  HGB 12.0 10.3* 11.7* 10.7*  HCT 36.2 31.0* 35.2* 31.6*  MCV 100.6* 99.7 98.9 98.8  PLT 232 198 223 205   Basic Metabolic Panel: Recent Labs  Lab 12/12/23 1925 12/14/23 0313 12/15/23 0814 12/16/23 0346  NA 143 140 139 139  K 3.8 4.0 4.0 4.3  CL 108 110 110 109  CO2 22 21* 19* 20*  GLUCOSE 149* 133* 126* 132*  BUN 29* 35* 40* 39*  CREATININE 1.84* 1.79* 1.83* 1.95*  CALCIUM  9.0 8.4* 8.6* 8.6*   GFR: Estimated Creatinine Clearance: 25.1 mL/min (A) (by C-G formula based on SCr of 1.95 mg/dL (H)). Liver Function Tests: Recent Labs  Lab 12/12/23 1925 12/15/23 0814 12/16/23 0346  AST 25 24 34  ALT 20 19 25   ALKPHOS 80 88 71  BILITOT 0.3 0.2 0.3  PROT 7.3 6.7 6.1*  ALBUMIN  3.5 3.2* 2.8*   CBG: Recent Labs  Lab 12/15/23  9096 12/15/23 1155 12/15/23 1817 12/15/23 2150 12/16/23 0802  GLUCAP 158* 127* 147* 132* 141*   Sepsis Labs: Recent Labs  Lab 12/14/23 0313 12/15/23 0814  PROCALCITON <0.10 <0.10    Recent Results (from the past 240 hours)  Urine Culture     Status: None   Collection Time: 12/13/23  7:36 AM   Specimen: Urine, Clean Catch  Result Value Ref Range Status   Specimen Description URINE, CLEAN CATCH  Final   Special Requests NONE  Final   Culture   Final    NO GROWTH Performed at [facility], [address], [place], [place] [address]    Report Status 12/14/2023 FINAL  Final     Radiology Studies: No results found.  Scheduled Meds:  allopurinol   300 mg Oral Daily   aspirin  EC  81 mg Oral Daily   enoxaparin  (LOVENOX ) injection  30 mg Subcutaneous Daily   gabapentin   100 mg Oral BID   insulin  aspart  0-15 Units Subcutaneous TID WC   insulin  glargine-yfgn  35 Units Subcutaneous QHS   latanoprost   1 drop Right Eye QHS   loratadine   10 mg Oral Daily   metoprolol  tartrate  25 mg Oral BID   polyvinyl alcohol   1 drop Left Eye QHS   simvastatin   40 mg Oral QHS   sodium chloride   flush  3 mL Intravenous Q12H   Continuous Infusions:   LOS: 3 days   Time spent: 40 minutes  Camellia Door, DO  [facility]  12/16/2023, 10:18 AM

## 2023-12-16 NOTE — Plan of Care (Signed)

## 2023-12-16 NOTE — Discharge Summary (Signed)
 [facility] Phys[facility] Discharge Summary   Patient name: Cheryl Gill  Admit date:     12/12/2023  Discharge date: 12/16/2023  Attending Physician: CLAUDENE MAXIMINO LABOR [[id number]]  Discharge Physician: Camellia Door   PCP: Shelda Atlas, MD  Admitted From: Home  Disposition:  Home  Recommendations for Outpatient Follow-up:  Follow up with PCP in 1-2 weeks Will need repeat BMP in office at PCP followup before restarting hydrochlorothiazide  and colchicine .  Home Health:No Equipment/Devices: None    Discharge Condition:Stable CODE STATUS:FULL Diet recommendation: Diabetic Fluid Restriction: None  Hospital Summary: HPI: Cheryl Gill is a 73 y.o. female with medical history significant of hypertension, hyperlipidemia, diabetes mellitus type 2, CAD s/p CABG, CKD stage IIIb, obesity presents with complaints of persistent left flank pain and urinary discomfort. These symptoms began approximately two weeks prior, at which time she was diagnosed with a bladder infection and prescribed 7-day course of Macrobid  which she completed last week.  Despite completing the course of antibiotics, her symptoms did not improve.  Her pain has been severe and persistent. Associated symptoms included urinary frequency nausea, and vomiting.  She denies having any recent fevers, chest pain, shortness of breath, cough, leg swelling, or rash.   In the emergency department patient was noted to be afebrile with blood pressures elevated up to 163/73, and all other vital signs maintained.  Labs significant for WBC 16.6, BUN 29, and creatinine 1.84.  CT renal study noted a small hiatal hernia, cholelithiasis without cholecystitis.  Urinalysis positive for moderate leukocytes, many bacteria, 6-10 squamous epithelial cells and 21-50 WBCs.  Urine culture was collected. Patient had been given 1 L of normal saline IV fluids, ketorolac  15 mg IV, and Rocephin .    Significant Events: Admitted 12/12/2023  for clinical pyelonephritis   Significant Labs: WBC 16.6, BUN 29, and creatinine 1.84  Urinalysis positive for moderate leukocytes, many bacteria, 6-10 squamous epithelial cells and 21-50 WBCs.   Significant Imaging Studies: CT renal study noted a small hiatal hernia, cholelithiasis without cholecystitis.   Antibiotic Therapy: Anti-infectives (From admission, onward)    Start     Dose/Rate Route Frequency Ordered Stop   12/14/23 0800  cefTRIAXone  (ROCEPHIN ) 1 g in sodium chloride  0.9 % 100 mL IVPB        1 g 200 mL/hr over 30 Minutes Intravenous Every 24 hours 12/13/23 1022     12/13/23 0715  cefTRIAXone  (ROCEPHIN ) 1 g in sodium chloride  0.9 % 100 mL IVPB        1 g 200 mL/hr over 30 Minutes Intravenous  Once 12/13/23 0706 12/13/23 1025       Procedures:   Consultants:    Hospital Course by Problem: * Pyelonephritis 12-14-2023 continue with IV rocephin . Awaiting urine cx. Appears pt failed outpatient treatment with macrobid .  12-14-2022 urine cx negative. Today is Day #3 IV Rocephin . Monitor overnight and potential DC in AM after 4th dose of IV rocephin . 12-16-2023 PIV not working today. Has completed 3 days of IV rocephin . DC to home with Duricef 500 mg bid x 11 days. Pt to start po duricef today. F/u with PCP in 1-2 weeks.  Acute kidney injury superimposed on stage 3b chronic kidney disease (HCC) - baseline Scr 1.4-1.7 12-14-2023 pt without N/V. Continue with po liquids. Hold nephrotoxic agents  12-15-2023 awaiting BMP today 12-16-2023 Scr stable at 1.8. likely due to pyelonephritis. Pt is urinating well. Will need repeat BMP in PCP f/u appointment. Hold hydrochlorothiazide  and colchicine  until seen.  Type 2  diabetes mellitus with complication, with long-term current use of insulin  (HCC) 12-14-2023 continue with lantus  and SSI. A1c 6.8% indicating decent/good outpatient control of her diabetes. 12-15-2023 CBG in acceptable ranges  Obesity, Class II, BMI 35-39.9 BMI  35.52  Hyperlipidemia associated with type 2 diabetes mellitus (HCC) 12-14-2023 continue with zocor  40 mg at bedtime.  Essential hypertension 12-14-2023 continue with lopressor  bid. Prn hydralazine . Hold HCTZ due to AKI. 12-15-2023 stable. Continue to hold hydrochlorothiazide . Awaiting BMP 12-16-2023 hold hydrochlorothiazide  at discharge. Will need repeat BMP at PCP f/u appointment. Continue lopressor  bid    Discharge Diagnoses:  Principal Problem:   Pyelonephritis Active Problems:   Acute kidney injury superimposed on stage 3b chronic kidney disease (HCC) - baseline Scr 1.4-1.7   Essential hypertension   Hyperlipidemia associated with type 2 diabetes mellitus (HCC)   Obesity, Class II, BMI 35-39.9   Type 2 diabetes mellitus with complication, with long-term current use of insulin  [facility])   Discharge Instructions  Discharge Instructions     Call MD for:  difficulty breathing, headache or visual disturbances   Complete by: As directed    Call MD for:  extreme fatigue   Complete by: As directed    Call MD for:  persistant dizziness or light-headedness   Complete by: As directed    Call MD for:  persistant nausea and vomiting   Complete by: As directed    Call MD for:  redness, tenderness, or signs of infection (pain, swelling, redness, odor or green/yellow discharge around incision site)   Complete by: As directed    Call MD for:  severe uncontrolled pain   Complete by: As directed    Call MD for:  temperature >100.4   Complete by: As directed    Diet - low sodium heart healthy   Complete by: As directed    Diet Carb Modified   Complete by: As directed    Discharge instructions   Complete by: As directed    1. Follow up with your primary care provider in 1-2 weeks following discharge.   Increase activity slowly   Complete by: As directed       Allergies as of 12/16/2023       Reactions   Ace Inhibitors Anaphylaxis   Lisinopril Anaphylaxis   Augmentin  [amoxicillin -pot Clavulanate]    diarrhea   Mushroom Extract Complex (do Not Select) Swelling   Tetanus Toxoids Nausea And Vomiting, Swelling        Medication List     PAUSE taking these medications    colchicine  0.6 MG tablet Wait to take this until your doctor or other care provider tells you to start again. Take 0.5 tablets (0.3 mg total) by mouth 2 (two) times daily.   hydrochlorothiazide  25 MG tablet Wait to take this until your doctor or other care provider tells you to start again. Commonly known as: HYDRODIURIL  Take 25 mg by mouth daily.       TAKE these medications    AeroChamber Plus inhaler Use as instructed   albuterol  108 (90 Base) MCG/ACT inhaler Commonly known as: VENTOLIN  HFA Inhale 2 puffs into the lungs every 4 (four) hours as needed. For shortness of breath or wheezing   allopurinol  300 MG tablet Commonly known as: ZYLOPRIM  Take 300 mg by mouth daily.   APPLE CIDER VINEGAR-GINGER PO Take 1 capsule by mouth daily.   aspirin  EC 81 MG tablet Take 81 mg by mouth at bedtime.   b complex vitamins capsule Take 1 capsule  by mouth daily.   carboxymethylcellulose 1 % ophthalmic solution Place 1 drop into the left eye at bedtime.   cefadroxil  500 MG capsule Commonly known as: DURICEF Take 1 capsule (500 mg total) by mouth 2 (two) times daily for 11 days.   cetirizine  10 MG tablet Commonly known as: ZYRTEC  Take 10 mg by mouth daily.   Droplet Pen Needles 32G X 4 MM Misc Generic drug: Insulin  Pen Needle   ELDERBERRY PO Take 1 capsule by mouth daily.   fluticasone  50 MCG/ACT nasal spray Commonly known as: FLONASE  Place 1 spray into both nostrils daily for 3 days. What changed:  when to take this reasons to take this   gabapentin  100 MG capsule Commonly known as: NEURONTIN  Take 100 mg by mouth 2 (two) times daily.   glipiZIDE  10 MG tablet Commonly known as: GLUCOTROL  Take 10 mg by mouth daily before breakfast.   Lantus  SoloStar 100  UNIT/ML Solostar Pen Generic drug: insulin  glargine Inject 35-70 Units into the skin See admin instructions. Inject 70 units into the skin at bedtime if blood sugar over 200. If blood sugar is over 134, inject 35 units. If blood sugar is lower, do not use.   latanoprost  0.005 % ophthalmic solution Commonly known as: XALATAN  Place 1 drop into the right eye at bedtime.   metoprolol  tartrate 25 MG tablet Commonly known as: LOPRESSOR  Take 1.5 tablets (37.5 mg total) by mouth 2 (two) times daily. What changed: how much to take   Mounjaro 5 MG/0.5ML Pen Generic drug: tirzepatide Inject 10 mg into the skin once a week.   simvastatin  40 MG tablet Commonly known as: ZOCOR  Take 40 mg by mouth at bedtime.   tiZANidine  4 MG tablet Commonly known as: ZANAFLEX  Take 4 mg by mouth 2 (two) times daily as needed for muscle spasms.   Trelegy Ellipta 100-62.5-25 MCG/ACT Aepb Generic drug: Fluticasone -Umeclidin-Vilant Inhale 1 puff into the lungs daily as needed (shortness of breath).   True Metrix Air Glucose Meter w/Device Kit   True Metrix Blood Glucose Test test strip Generic drug: glucose blood 1 each 3 (three) times daily.   TRUEplus Lancets 33G Misc Apply 1 each topically 3 (three) times daily.   VITAMIN C PO Take 1 tablet by mouth daily.   VITAMIN D PO Take 1 tablet by mouth daily.        Allergies  Allergen Reactions   Ace Inhibitors Anaphylaxis   Lisinopril Anaphylaxis   Augmentin [Amoxicillin -Pot Clavulanate]     diarrhea   Mushroom Extract Complex (Do Not Select) Swelling   Tetanus Toxoids Nausea And Vomiting and Swelling    Discharge Exam: Vitals:   12/16/23 0804 12/16/23 0836  BP: (!) 144/83 (!) 144/83  Pulse: 70 70  Resp: 19   Temp: 98.5 F (36.9 C)   SpO2: 97%     Physical Exam Vitals and nursing note reviewed.  Constitutional:      General: She is not in acute distress.    Appearance: She is obese. She is not toxic-appearing or diaphoretic.   HENT:     Head: Normocephalic and atraumatic.     Nose: Nose normal.  Cardiovascular:     Rate and Rhythm: Normal rate and regular rhythm.  Pulmonary:     Effort: Pulmonary effort is normal.     Breath sounds: Normal breath sounds.  Abdominal:     General: Bowel sounds are normal.  Musculoskeletal:     Right lower leg: No edema.  Left lower leg: No edema.  Skin:    General: Skin is warm and dry.     Capillary Refill: Capillary refill takes less than 2 seconds.  Neurological:     General: No focal deficit present.     Mental Status: She is alert and oriented to person, place, and time.     The results of significant diagnostics from this hospitalization (including imaging, microbiology, ancillary and laboratory) are listed below for reference.    Microbiology: Recent Results (from the past 240 hours)  Urine Culture     Status: None   Collection Time: 12/13/23  7:36 AM   Specimen: Urine, Clean Catch  Result Value Ref Range Status   Specimen Description URINE, CLEAN CATCH  Final   Special Requests NONE  Final   Culture   Final    NO GROWTH Performed at [facility], [address] [place], [place] [address]    Report Status 12/14/2023 FINAL  Final     Labs:  Basic Metabolic Panel: Recent Labs  Lab 12/12/23 1925 12/14/23 0313 12/15/23 0814 12/16/23 0346  NA 143 140 139 139  K 3.8 4.0 4.0 4.3  CL 108 110 110 109  CO2 22 21* 19* 20*  GLUCOSE 149* 133* 126* 132*  BUN 29* 35* 40* 39*  CREATININE 1.84* 1.79* 1.83* 1.95*  CALCIUM  9.0 8.4* 8.6* 8.6*   Liver Function Tests: Recent Labs  Lab 12/12/23 1925 12/15/23 0814 12/16/23 0346  AST 25 24 34  ALT 20 19 25   ALKPHOS 80 88 71  BILITOT 0.3 0.2 0.3  PROT 7.3 6.7 6.1*  ALBUMIN  3.5 3.2* 2.8*   CBC: Recent Labs  Lab 12/12/23 1925 12/14/23 0313 12/15/23 0814 12/16/23 0346  WBC 16.6* 11.5* 12.0* 12.1*  NEUTROABS 9.7*  --  5.3 6.0  HGB 12.0 10.3* 11.7* 10.7*  HCT 36.2 31.0* 35.2* 31.6*  MCV  100.6* 99.7 98.9 98.8  PLT 232 198 223 205   CBG: Recent Labs  Lab 12/15/23 0903 12/15/23 1155 12/15/23 1817 12/15/23 2150 12/16/23 0802  GLUCAP 158* 127* 147* 132* 141*   Urinalysis    Component Value Date/Time   COLORURINE YELLOW 12/12/2023 1933   APPEARANCEUR CLOUDY (A) 12/12/2023 1933   LABSPEC 1.023 12/12/2023 1933   PHURINE 5.0 12/12/2023 1933   GLUCOSEU NEGATIVE 12/12/2023 1933   HGBUR NEGATIVE 12/12/2023 1933   BILIRUBINUR NEGATIVE 12/12/2023 1933   KETONESUR NEGATIVE 12/12/2023 1933   PROTEINUR NEGATIVE 12/12/2023 1933   UROBILINOGEN 0.2 02/08/2022 1059   NITRITE NEGATIVE 12/12/2023 1933   LEUKOCYTESUR MODERATE (A) 12/12/2023 1933   Sepsis Labs Recent Labs  Lab 12/12/23 1925 12/14/23 0313 12/15/23 0814 12/16/23 0346  WBC 16.6* 11.5* 12.0* 12.1*   Microbiology Recent Results (from the past 240 hours)  Urine Culture     Status: None   Collection Time: 12/13/23  7:36 AM   Specimen: Urine, Clean Catch  Result Value Ref Range Status   Specimen Description URINE, CLEAN CATCH  Final   Special Requests NONE  Final   Culture   Final    NO GROWTH Performed at [facility], [address] [place], [place] [address]    Report Status 12/14/2023 FINAL  Final    Procedures/Studies: CT Renal Stone Study Result Date: 12/13/2023 CLINICAL DATA:  Abdominal/flank pain, stone suspected Left flank pain EXAM: CT ABDOMEN AND PELVIS WITHOUT CONTRAST TECHNIQUE: Multidetector CT imaging of the abdomen and pelvis was performed following the standard protocol without IV contrast. RADIATION  DOSE REDUCTION: This exam was performed according to the departmental dose-optimization program which includes automated exposure control, adjustment of the mA and/or kV according to patient size and/or use of iterative reconstruction technique. COMPARISON:  CT abdomen 05/12/2018 FINDINGS: Lower chest: No acute abnormality.  Small hiatal hernia. Hepatobiliary: No focal liver abnormality.  Layering hyperdensity within the gallbladder lumen suggestive of cholelithiasis versus gallbladder sludge. No gallbladder wall thickening or pericholecystic fluid. No biliary dilatation. Pancreas: No focal lesion. Normal pancreatic contour. No surrounding inflammatory changes. No main pancreatic ductal dilatation. Spleen: Normal in size without focal abnormality. Adrenals/Urinary Tract: No adrenal nodule bilaterally. No nephrolithiasis and no hydronephrosis. No definite contour-deforming renal mass. No ureterolithiasis or hydroureter. The urinary bladder is unremarkable. Stomach/Bowel: Stomach is within normal limits. No evidence of bowel wall thickening or dilatation. Colonic diverticulosis. The appendix is not definitely identified with no inflammatory changes in the right lower quadrant to suggest acute appendicitis. Vascular/Lymphatic: No abdominal aorta or iliac aneurysm. Mild atherosclerotic plaque of the aorta and its branches. No abdominal, pelvic, or inguinal lymphadenopathy. Reproductive: Status post hysterectomy. No adnexal masses. Other: No intraperitoneal free fluid. No intraperitoneal free gas. No organized fluid collection. Musculoskeletal: No abdominal wall hernia or abnormality. Healed anterior abdominal incision. No suspicious lytic or blastic osseous lesions. No acute displaced fracture. IMPRESSION: 1. Small hiatal hernia. 2. Cholelithiasis versus gallbladder sludge with no CT evidence of acute cholecystitis. 3. Colonic diverticulosis with no acute diverticulitis 4. Aortic Atherosclerosis (ICD10-I70.0). Electronically Signed   By: Morgane  Naveau M.D.   On: 12/13/2023 01:55    Time coordinating discharge: 45 mins  SIGNED:  Camellia Door, DO [facility] 12/16/23, 10:19 AM

## 2023-12-16 NOTE — Progress Notes (Signed)
 Discharge instructions reviewed with pt.  Copy of instructions given to pt. Pt informed her scripts were sent to her pharmacy for pick up. Pt to call for a follow up appointment with her PCP in next 1-2 weeks, pt states she will call and set up. Pt verbalized understanding of instructions and able to teach back.the patient drove herself, she has a steady gait and fully alert and oriented.  Pt to be  d/c'd via wheelchair with belongings, and will be escorted by staff/hospital volunteer to the ER entrance.   Naiyah Klostermann,RN SWOT

## 2023-12-17 ENCOUNTER — Ambulatory Visit
Admission: RE | Admit: 2023-12-17 | Discharge: 2023-12-17 | Disposition: A | Discharge status: Home / Self Care | Payer: Medicare HMO | Source: Ambulatory Visit | Attending: Internal Medicine | Admitting: Internal Medicine

## 2023-12-17 DIAGNOSIS — Z1231 Encounter for screening mammogram for malignant neoplasm of breast: Secondary | ICD-10-CM

## 2023-12-22 ENCOUNTER — Other Ambulatory Visit: Payer: Self-pay | Admitting: Internal Medicine

## 2023-12-22 DIAGNOSIS — R928 Other abnormal and inconclusive findings on diagnostic imaging of breast: Secondary | ICD-10-CM

## 2023-12-30 ENCOUNTER — Ambulatory Visit
Admission: RE | Admit: 2023-12-30 | Discharge: 2023-12-30 | Disposition: A | Discharge status: Home / Self Care | Payer: Medicare HMO | Source: Ambulatory Visit | Attending: Internal Medicine | Admitting: Internal Medicine

## 2023-12-30 ENCOUNTER — Other Ambulatory Visit: Payer: Medicare HMO

## 2023-12-30 DIAGNOSIS — R928 Other abnormal and inconclusive findings on diagnostic imaging of breast: Secondary | ICD-10-CM

## 2024-02-16 ENCOUNTER — Ambulatory Visit (HOSPITAL_COMMUNITY)
Admission: EM | Admit: 2024-02-16 | Discharge: 2024-02-16 | Disposition: A | Discharge status: Home / Self Care | Attending: Physician Assistant | Admitting: Physician Assistant

## 2024-02-16 ENCOUNTER — Encounter (HOSPITAL_COMMUNITY): Payer: Self-pay

## 2024-02-16 DIAGNOSIS — R051 Acute cough: Secondary | ICD-10-CM

## 2024-02-16 DIAGNOSIS — J329 Chronic sinusitis, unspecified: Secondary | ICD-10-CM | POA: Diagnosis not present

## 2024-02-16 DIAGNOSIS — J40 Bronchitis, not specified as acute or chronic: Secondary | ICD-10-CM

## 2024-02-16 MED ORDER — DOXYCYCLINE HYCLATE 100 MG PO CAPS: 100.0000 mg | ORAL_CAPSULE | Freq: Two times a day (BID) | ORAL | 0 refills | Status: DC

## 2024-02-16 NOTE — ED Provider Notes (Signed)
 [facility]    CSN: [id number] Arrival date & time: 02/16/24  1093      History   Chief Complaint Chief Complaint  Patient presents with   Otalgia   Cough    HPI Cheryl Gill is a 73 y.o. female.   Patient presents today with a several week history of URI symptoms.  She reports bilateral ear pain, congestion, drainage, cough.  Denies any chest pain, shortness of breath, fever, nausea, vomiting, diarrhea.  She does have a history of asthma but has not required albuterol since symptoms began.  She denies any recent antibiotics or steroids; was hospitalized December 2024 for pyelonephritis and given Rocephin but has not had additional antibiotics since that time.  She denies any known sick contacts.  She has been taking over-the-counter cold and flu medication without improvement of symptoms.  She denies any recent swimming, airplane travel.  She does use Q-tips occasionally.  Denies any associated otorrhea.  She reports that pain is rated 8 on a 0 to pain scale, described as a fullness in her left ear, no alleviating factors identified.    Past Medical History:  Diagnosis Date   Acute gout 06/19/2018   Anaphylactic reaction, due to adverse effect of correct medicinal substance properly administered 01/16/2013   Asthma    CAD (coronary artery disease)    a. 12/2014: NSTEMI with cath showing Ostial LAD ~95%, mLAD 80-90%; EF ~40-45%, LAD disease compromised major D1 branch. CABG recommended and performed with LIMA-LAD and SVG-D1.    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   LIMA-LAD,  SVG-D1   Seasonal allergies     Patient Active Problem List   Diagnosis Date Noted   Pyelonephritis 12/13/2023   Acute kidney injury superimposed on stage 3b chronic kidney disease (HCC) - baseline Scr 1.4-1.7 12/13/2023   Type 2 diabetes mellitus with complication, with long-term current use of insulin (HCC) 12/13/2023   CKD (chronic kidney disease), stage III (HCC) 06/18/2018   Joint pain 06/18/2018   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   Coronary artery disease involving native coronary artery without angina pectoris 12/19/2014   H/O NSTEMI (non-ST elevated myocardial infarction) (HCC) 12/18/2014   Obesity, Class II, BMI 35-39.9 12/18/2014   Metabolic syndrome: DM, HTN, Obesity as well as HLD 12/18/2014   Frequency of urination 07/17/2014   Nocturia more than twice per night 07/17/2014   Asthma, chronic 01/28/2014   Hyperlipidemia associated with type 2 diabetes mellitus (HCC) 01/28/2014   Normocytic anemia 01/18/2013   Essential hypertension 01/16/2013   Diabetes mellitus type 2, uncontrolled 01/16/2013   Seasonal allergies     Past Surgical History:  Procedure Laterality Date   ABDOMINAL HYSTERECTOMY     CARDIAC SURGERY     CORONARY ARTERY BYPASS GRAFT N/A 12/21/2014   Procedure: CORONARY ARTERY BYPASS GRAFTING (CABG) TIMES TWO USING LEFT INTERNAL MAMMARY AND RIGHT SAPHENOUS LEG VEIN HARVESTED ENDOSCOPICALLY;  Surgeon: Loreli Slot, MD;  Location: [facility] OR;  Service: Open Heart Surgery;  Laterality: N/A;   LEFT HEART  CATHETERIZATION WITH CORONARY ANGIOGRAM N/A 12/18/2014   Procedure: LEFT HEART CATHETERIZATION WITH CORONARY ANGIOGRAM;  Surgeon: Peter M Swaziland, MD;  Location: [facility] CATH LAB;  Service: Cardiovascular;  Laterality: N/A;   NM MYOCAR PERF WALL MOTION  04/09/2010   Abnormal study - appears to be a small area of apical infarction. Reversible ischemia is not seen.   NM MYOVIEW LTD  10/2019   No ischemia or infarction.  Small apical defect likely artifact.  LOW RISK.  EF 60 to  65%.   TEE WITHOUT CARDIOVERSION N/A 12/21/2014   Procedure: TRANSESOPHAGEAL ECHOCARDIOGRAM (TEE);  Surgeon: Loreli Slot, MD;  Location: [facility] OR;  Service: Open Heart Surgery;  Laterality: N/A;   TONSILLECTOMY     TRANSTHORACIC ECHOCARDIOGRAM  12/2014   LVEF 60-65%, normal wall thickness, normal wall motion, diastolic dysfunction, indeterminate LV filling pressure, normal LA size.    OB History     Gravida  5   Para  4   Term  4   Preterm      AB  1   Living  4      SAB  1   IAB      Ectopic      Multiple      Live Births  4            Home Medications    Prior to Admission medications   Medication Sig Start Date End Date Taking? Authorizing Provider  doxycycline (VIBRAMYCIN) 100 MG capsule Take 1 capsule (100 mg total) by mouth 2 (two) times daily. 02/16/24  Yes Latosha Gaylord K, PA-C  albuterol (PROVENTIL HFA;VENTOLIN HFA) 108 (90 Base) MCG/ACT inhaler Inhale 2 puffs into the lungs every 4 (four) hours as needed. For shortness of breath or wheezing 01/18/19   Domenick Gong, MD  allopurinol (ZYLOPRIM) 300 MG tablet Take 300 mg by mouth daily.    [provider]  APPLE CIDER VINEGAR-GINGER PO Take 1 capsule by mouth daily.    [provider]  Ascorbic Acid (VITAMIN C PO) Take 1 tablet by mouth daily.    [provider]  aspirin EC 81 MG tablet Take 81 mg by mouth at bedtime.    [provider]  b complex vitamins capsule Take 1 capsule by mouth daily.    [provider]  Blood Glucose Monitoring Suppl (TRUE METRIX AIR GLUCOSE METER) w/Device KIT  12/28/21   [provider]  carboxymethylcellulose 1 % ophthalmic solution Place 1 drop into the left eye at bedtime.    [provider]  cetirizine (ZYRTEC) 10 MG tablet Take 10 mg by mouth daily.    [provider]  colchicine 0.6 MG tablet Take 0.5 tablets (0.3 mg total) by mouth 2 (two) times daily. Patient taking differently: Take 0.3 mg by  mouth 2 (two) times daily as needed (gout flares). 06/20/18   Danford, Earl Lites, MD  DROPLET PEN NEEDLES 32G X 4 MM MISC  12/28/21   [provider]  ELDERBERRY PO Take 1 capsule by mouth daily.    [provider]  fluticasone (FLONASE) 50 MCG/ACT nasal spray Place 1 spray into both nostrils daily for 3 days. Patient taking differently: Place 1 spray into both nostrils daily as needed for allergies. 07/25/22 12/13/23  Gustavus Bryant, FNP  gabapentin (NEURONTIN) 100 MG capsule Take 100 mg by mouth 2 (two) times daily. 11/16/23   [provider]  glipiZIDE (GLUCOTROL) 10 MG tablet Take 10 mg by mouth daily before  breakfast.    [provider]  hydrochlorothiazide (HYDRODIURIL) 25 MG tablet Take 25 mg by mouth daily.    [provider]  LANTUS SOLOSTAR 100 UNIT/ML Solostar Pen Inject 35-70 Units into the skin See admin instructions. Inject 70 units into the skin at bedtime if blood sugar over 200. If blood sugar is over 134, inject 35 units. If blood sugar is lower, do not use. 03/15/18   [provider]  latanoprost (XALATAN) 0.005 % ophthalmic solution Place 1 drop into the right eye at bedtime. 10/07/23   [provider]  metoprolol tartrate (LOPRESSOR) 25 MG tablet Take 1.5 tablets (37.5 mg total) by mouth 2 (two) times daily. Patient taking differently: Take 25 mg by mouth 2 (two) times daily. 08/05/23 12/13/23  Azalee Course, PA  MOUNJARO 5 MG/0.5ML Pen Inject 10 mg into the skin once a week. 01/20/23   [provider]  simvastatin (ZOCOR) 40 MG tablet Take 40 mg by mouth at bedtime. 02/01/18   [provider]  Spacer/Aero-Holding Chambers (AEROCHAMBER PLUS) inhaler Use as instructed 01/18/19   Domenick Gong, MD  tiZANidine (ZANAFLEX) 4 MG tablet Take 4 mg by mouth 2 (two) times daily as needed for muscle spasms. 10/15/23   [provider]  TRELEGY ELLIPTA 100-62.5-25 MCG/ACT AEPB Inhale 1 puff into the lungs daily as  needed (shortness of breath). 01/18/23   [provider]  TRUE METRIX BLOOD GLUCOSE TEST test strip 1 each 3 (three) times daily. 06/29/23   [provider]  TRUEplus Lancets 33G MISC Apply 1 each topically 3 (three) times daily. 07/02/23   [provider]  VITAMIN D PO Take 1 tablet by mouth daily.    [provider]    Family History Family History  Problem Relation Age of Onset   Pancreatic cancer Mother    CAD Mother        Open heart surgery 60s-70s   Stroke Father    Heart disease Sister        Unclear details    Social History Social History   Tobacco Use   Smoking status: Never   Smokeless tobacco: Never  Vaping Use   Vaping status: Never Used  Substance Use Topics   Alcohol use: No   Drug use: No     Allergies   Ace inhibitors, Lisinopril, Augmentin [amoxicillin-pot clavulanate], Mushroom extract complex (obsolete), and Tetanus toxoids   Review of Systems Review of Systems  Constitutional:  Positive for activity change. Negative for appetite change, fatigue and fever.  HENT:  Positive for congestion, postnasal drip and sore throat. Negative for sinus pressure and sneezing.   Respiratory:  Positive for cough. Negative for shortness of breath.   Cardiovascular:  Negative for chest pain.  Gastrointestinal:  Negative for abdominal pain, diarrhea, nausea and vomiting.  Neurological:  Negative for dizziness, light-headedness and headaches.     Physical Exam Triage Vital Signs ED Triage Vitals [02/16/24 1020]  Encounter Vitals Group     BP 130/79     Systolic BP Percentile      Diastolic BP Percentile      Gill Rate 71     Resp 16     Temp 98.1 F (36.7 C)     Temp Source Oral     SpO2 96 %     Weight      Height      Head Circumference      Peak Flow      Pain Score  8     Pain Loc      Pain Education      Exclude from Growth Chart    No data found.  Updated Vital Signs BP 130/79 (BP Location: Left Arm)   Gill  69   Temp 98.1 F (36.7 C) (Oral)   Resp 16   SpO2 99%   Visual Acuity Right Eye Distance:   Left Eye Distance:   Bilateral Distance:    Right Eye Near:   Left Eye Near:    Bilateral Near:     Physical Exam Vitals reviewed.  Constitutional:      General: She is awake. She is not in acute distress.    Appearance: Normal appearance. She is well-developed. She is not ill-appearing.     Comments: Very pleasant female appears stated age in no acute distress sitting comfortably in exam room.  HENT:     Head: Normocephalic and atraumatic.     Right Ear: Ear canal and external ear normal. A middle ear effusion is present. Tympanic membrane is not erythematous or bulging.     Left Ear: Ear canal and external ear normal. A middle ear effusion is present. Tympanic membrane is not erythematous or bulging.     Nose:     Right Sinus: Maxillary sinus tenderness present. No frontal sinus tenderness.     Left Sinus: Maxillary sinus tenderness and frontal sinus tenderness present.     Mouth/Throat:     Pharynx: Uvula midline. Postnasal drip present. No oropharyngeal exudate or posterior oropharyngeal erythema.  Cardiovascular:     Rate and Rhythm: Normal rate and regular rhythm.     Heart sounds: Normal heart sounds, S1 normal and S2 normal. No murmur heard. Pulmonary:     Effort: Pulmonary effort is normal.     Breath sounds: Normal breath sounds. No wheezing, rhonchi or rales.     Comments: Clear to auscultation bilaterally Psychiatric:        Behavior: Behavior is cooperative.      UC Treatments / Results  Labs (all labs ordered are listed, but only abnormal results are displayed) Labs Reviewed - No data to display  EKG   Radiology No results found.  Procedures Procedures (including critical care time)  Medications Ordered in UC Medications - No data to display  Initial Impression / Assessment and Plan / UC Course  I have reviewed the triage vital signs and the nursing  notes.  Pertinent labs & imaging results that were available during my care of the patient were reviewed by me and considered in my medical decision making (see chart for details).     Patient is well-appearing, afebrile, nontoxic, nontachycardic.  Viral testing was deferred as she has been symptomatic for several weeks and this would not change management.  Chest x-ray was deferred as she had oxygen saturation of 99% with no adventitious lung sounds on exam.  Concern for sinobronchitis and she was started on doxycycline given history of intolerance of Augmentin as well as history of chronic kidney disease since this does not require renal adjustment.  We discussed that she she is to avoid prolonged sun exposure while on this medication due to associated photosensitivity.  She can use over-the-counter medication including Mucinex, Flonase, Tylenol, nasal saline/sinus rinses.  Recommend she follow-up with her primary care next week.  If her symptoms worsen in any way and she has high fever, worsening cough, shortness of breath, chest pain, nausea/vomiting she needs to be seen immediately.  Strict  return precautions given.  All questions answered to patient satisfaction.  Final Clinical Impressions(s) / UC Diagnoses   Final diagnoses:  Sinobronchitis  Acute cough     Discharge Instructions      We are treating you for a sinus infection.  Start doxycycline 100 mg twice daily for 10 days.  Stay out of the sun while on this medication as it can cause bad sunburns.  Use over-the-counter medication including Mucinex, Flonase, Tylenol.  I also recommend nasal saline and sinus rinses.  Make sure you rest and drink plenty of fluid.  If your symptoms are not improving within a week or if anything worsens and you have high fever, worsening cough, shortness of breath, chest pain, weakness, nausea/vomiting interfering with oral intake you need to be seen immediately.     ED Prescriptions     Medication  Sig Dispense Auth. Provider   doxycycline (VIBRAMYCIN) 100 MG capsule Take 1 capsule (100 mg total) by mouth 2 (two) times daily. 20 capsule Henri Baumler, Noberto Retort, PA-C      PDMP not reviewed this encounter.   Jeani Hawking, PA-C 02/16/24 1057

## 2024-02-16 NOTE — ED Triage Notes (Signed)
 Patient c/o a cough, bilateral ear pain x 10 days.  Patient states she has been taking Tylenol and the last dose was a "few days ago."

## 2024-02-16 NOTE — Discharge Instructions (Signed)
 We are treating you for a sinus infection.  Start doxycycline 100 mg twice daily for 10 days.  Stay out of the sun while on this medication as it can cause bad sunburns.  Use over-the-counter medication including Mucinex, Flonase, Tylenol.  I also recommend nasal saline and sinus rinses.  Make sure you rest and drink plenty of fluid.  If your symptoms are not improving within a week or if anything worsens and you have high fever, worsening cough, shortness of breath, chest pain, weakness, nausea/vomiting interfering with oral intake you need to be seen immediately.

## 2024-05-04 ENCOUNTER — Encounter (HOSPITAL_COMMUNITY): Payer: Self-pay | Admitting: *Deleted

## 2024-05-04 ENCOUNTER — Other Ambulatory Visit: Payer: Self-pay

## 2024-05-04 ENCOUNTER — Ambulatory Visit (HOSPITAL_COMMUNITY)
Admission: EM | Admit: 2024-05-04 | Discharge: 2024-05-04 | Disposition: A | Discharge status: Home / Self Care | Attending: Physician Assistant | Admitting: Physician Assistant

## 2024-05-04 DIAGNOSIS — J019 Acute sinusitis, unspecified: Secondary | ICD-10-CM

## 2024-05-04 MED ORDER — DOXYCYCLINE HYCLATE 100 MG PO CAPS: 100.0000 mg | ORAL_CAPSULE | Freq: Two times a day (BID) | ORAL | 0 refills | Status: AC

## 2024-05-04 NOTE — ED Triage Notes (Signed)
 DOB and full name confirmed

## 2024-05-04 NOTE — ED Triage Notes (Signed)
 PT reports for one week she has had ear pain ,cough ,congestion . Pt had a sore throat but her throat feels better now. Pt denuies a fever.

## 2024-05-04 NOTE — ED Provider Notes (Signed)
 [facility]    CSN: [id number] Arrival date & time: 05/04/24  0803      History   Chief Complaint Chief Complaint  Patient presents with   Cough   Otalgia    HPI Cheryl Gill is a 73 y.o. female.   Patient presents today for evaluation of cough, congestion, sinus pressure and ear pain she has had for the last week.  She reports initially she had sore throat but this has improved significantly.  She has not had any fever.  She reports cough is productive at times.  She has tried Coricidin without resolution of symptoms.  The history is provided by the patient.    Past Medical History:  Diagnosis Date   Acute gout 06/19/2018   Anaphylactic reaction, due to adverse effect of correct medicinal substance properly administered 01/16/2013   Asthma    CAD (coronary artery disease)    a. 12/2014: NSTEMI with cath showing Ostial LAD ~95%, mLAD 80-90%; EF ~40-45%, LAD disease compromised major D1 branch. CABG recommended and performed with LIMA-LAD and SVG-D1.    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   LIMA-LAD, SVG-D1   Seasonal allergies     Patient Active Problem List   Diagnosis Date Noted   Pyelonephritis 12/13/2023   Acute kidney injury superimposed on stage 3b chronic kidney disease (HCC) - baseline Scr 1.4-1.7 12/13/2023   Type 2 diabetes mellitus with complication, with long-term current use of insulin  (HCC) 12/13/2023   CKD (chronic kidney disease), stage III (HCC) 06/18/2018   Joint pain 06/18/2018   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   Coronary artery disease involving  native coronary artery without angina pectoris 12/19/2014   H/O NSTEMI (non-ST elevated myocardial infarction) (HCC) 12/18/2014   Obesity, Class II, BMI 35-39.9 12/18/2014   Metabolic syndrome: DM, HTN, Obesity as well as HLD 12/18/2014   Frequency of urination 07/17/2014   Nocturia more than twice per night 07/17/2014   Asthma, chronic 01/28/2014   Hyperlipidemia associated with type 2 diabetes mellitus (HCC) 01/28/2014   Normocytic anemia 01/18/2013   Essential hypertension 01/16/2013   Diabetes mellitus type 2, uncontrolled 01/16/2013   Seasonal allergies     Past Surgical History:  Procedure Laterality Date   ABDOMINAL HYSTERECTOMY     CARDIAC SURGERY     CORONARY ARTERY BYPASS GRAFT N/A 12/21/2014   Procedure: CORONARY ARTERY BYPASS GRAFTING (CABG) TIMES TWO USING LEFT INTERNAL MAMMARY AND RIGHT SAPHENOUS LEG VEIN HARVESTED ENDOSCOPICALLY;  Surgeon: Zelphia Higashi, MD;  Location: [facility] OR;  Service: Open Heart Surgery;  Laterality: N/A;   LEFT HEART CATHETERIZATION WITH CORONARY ANGIOGRAM N/A 12/18/2014   Procedure: LEFT HEART CATHETERIZATION WITH CORONARY ANGIOGRAM;  Surgeon: Peter M Swaziland, MD;  Location: [facility] CATH LAB;  Service: Cardiovascular;  Laterality: N/A;   NM MYOCAR PERF WALL MOTION  04/09/2010   Abnormal study - appears to be a small area of apical infarction. Reversible ischemia is not seen.   NM MYOVIEW  LTD  10/2019   No ischemia or infarction.  Small  apical defect likely artifact.  LOW RISK.  EF 60 to 65%.   TEE WITHOUT CARDIOVERSION N/A 12/21/2014   Procedure: TRANSESOPHAGEAL ECHOCARDIOGRAM (TEE);  Surgeon: Zelphia Higashi, MD;  Location: [facility] OR;  Service: Open Heart Surgery;  Laterality: N/A;   TONSILLECTOMY     TRANSTHORACIC ECHOCARDIOGRAM  12/2014   LVEF 60-65%, normal wall thickness, normal wall motion, diastolic dysfunction, indeterminate LV filling pressure, normal LA size.    OB History     Gravida  5   Para  4   Term  4   Preterm      AB   1   Living  4      SAB  1   IAB      Ectopic      Multiple      Live Births  4            Home Medications    Prior to Admission medications   Medication Sig Start Date End Date Taking? Authorizing Provider  doxycycline  (VIBRAMYCIN ) 100 MG capsule Take 1 capsule (100 mg total) by mouth 2 (two) times daily for 7 days. 05/04/24 05/11/24 Yes Vernestine Gondola, PA-C  albuterol  (PROVENTIL  HFA;VENTOLIN  HFA) 108 (90 Base) MCG/ACT inhaler Inhale 2 puffs into the lungs every 4 (four) hours as needed. For shortness of breath or wheezing 01/18/19  Yes Ethlyn Herd, MD  allopurinol  (ZYLOPRIM ) 300 MG tablet Take 300 mg by mouth daily.    [provider]  APPLE CIDER VINEGAR-GINGER PO Take 1 capsule by mouth daily.   Yes [provider]  Ascorbic Acid (VITAMIN C PO) Take 1 tablet by mouth daily.   Yes [provider]  aspirin  EC 81 MG tablet Take 81 mg by mouth at bedtime.   Yes [provider]  b complex vitamins capsule Take 1 capsule by mouth daily.    [provider]  Blood Glucose Monitoring Suppl (TRUE METRIX AIR GLUCOSE METER) w/Device KIT  12/28/21  Yes [provider]  carboxymethylcellulose 1 % ophthalmic solution Place 1 drop into the left eye at bedtime.   Yes [provider]  cetirizine  (ZYRTEC ) 10 MG tablet Take 10 mg by mouth daily.   Yes [provider]  colchicine  0.6 MG tablet Take 0.5 tablets (0.3 mg total) by mouth 2 (two) times daily. Patient taking differently: Take 0.3 mg by mouth 2 (two) times daily as needed (gout flares). 06/20/18   Danford, Willis Harter, MD  DROPLET PEN NEEDLES 32G X 4 MM MISC  12/28/21  Yes [provider]  ELDERBERRY PO Take 1 capsule by mouth daily.   Yes [provider]  fluticasone  (FLONASE ) 50 MCG/ACT nasal spray Place 1 spray into both nostrils daily for 3 days. Patient taking differently: Place 1 spray into both nostrils daily as needed for allergies.  07/25/22 05/04/24 Yes Mound, Fairy Homer E, FNP  gabapentin  (NEURONTIN ) 100 MG capsule Take 100 mg by mouth 2 (two) times daily. 11/16/23   [provider]  glipiZIDE  (GLUCOTROL ) 10 MG tablet Take 10 mg by mouth daily before breakfast.   Yes [provider]  hydrochlorothiazide  (HYDRODIURIL ) 25 MG tablet Take 25 mg by mouth daily.   Yes [provider]  LANTUS  SOLOSTAR 100 UNIT/ML Solostar Pen Inject 35-70 Units into the skin See admin instructions. Inject 70 units into the skin at bedtime if blood sugar over 200. If blood sugar is over 134, inject 35 units. If blood sugar is lower, do not use. 03/15/18  Yes [provider]  latanoprost  (XALATAN ) 0.005 % ophthalmic solution Place 1 drop into the right eye at bedtime. 10/07/23  Yes [provider]  metoprolol  tartrate (LOPRESSOR ) 25 MG tablet Take 1.5 tablets (37.5 mg total) by mouth 2 (two) times daily. Patient taking differently: Take 25 mg by mouth 2 (two) times daily. 08/05/23 05/04/24 Yes Meng, Hao, PA  MOUNJARO 5 MG/0.5ML Pen Inject 10 mg into the skin once a week. 01/20/23  Yes [provider]  simvastatin  (ZOCOR ) 40 MG tablet Take 40 mg by mouth at bedtime. 02/01/18  Yes [provider]  Spacer/Aero-Holding Chambers (AEROCHAMBER PLUS) inhaler Use as instructed 01/18/19   Ethlyn Herd, MD  tiZANidine  (ZANAFLEX ) 4 MG tablet Take 4 mg by mouth 2 (two) times daily as needed for muscle spasms. 10/15/23   [provider]  TRELEGY ELLIPTA 100-62.5-25 MCG/ACT AEPB Inhale 1 puff into the lungs daily as needed (shortness of breath). 01/18/23  Yes [provider]  TRUE METRIX BLOOD GLUCOSE TEST test strip 1 each 3 (three) times daily. 06/29/23  Yes [provider]  TRUEplus Lancets 33G MISC Apply 1 each topically 3 (three) times daily. 07/02/23  Yes [provider]  VITAMIN D PO Take 1 tablet by mouth daily.   Yes [provider]    Family History Family History   Problem Relation Age of Onset   Pancreatic cancer Mother    CAD Mother        Open heart surgery 60s-70s   Stroke Father    Heart disease Sister        Unclear details    Social History Social History   Tobacco Use   Smoking status: Never   Smokeless tobacco: Never  Vaping Use   Vaping status: Never Used  Substance Use Topics   Alcohol  use: No   Drug use: No     Allergies   Ace inhibitors, Lisinopril, Augmentin [amoxicillin -pot clavulanate], Mushroom extract complex (obsolete), and Tetanus toxoids   Review of Systems Review of Systems  Constitutional:  Negative for chills and fever.  HENT:  Positive for congestion, ear pain, sinus pressure and sore throat (resolved).   Eyes:  Negative for discharge and redness.  Respiratory:  Positive for cough. Negative for shortness of breath and wheezing.   Gastrointestinal:  Negative for abdominal pain, diarrhea, nausea and vomiting.     Physical Exam Triage Vital Signs ED Triage Vitals  Encounter Vitals Group     BP      Systolic BP Percentile      Diastolic BP Percentile      Pulse      Resp      Temp      Temp src      SpO2      Weight      Height      Head Circumference      Peak Flow      Pain Score      Pain Loc      Pain Education      Exclude from Growth Chart    No data found.  Updated Vital Signs BP 127/82   Pulse 99   Temp 98.1 F (36.7 C)   Resp 20   SpO2 99%   Visual Acuity Right Eye Distance:   Left Eye Distance:   Bilateral Distance:    Right Eye Near:   Left Eye Near:    Bilateral Near:     Physical Exam Vitals and nursing  note reviewed.  Constitutional:      General: She is not in acute distress.    Appearance: Normal appearance. She is not ill-appearing.  HENT:     Head: Normocephalic and atraumatic.     Right Ear: Tympanic membrane normal.     Left Ear: Tympanic membrane normal.     Nose: Congestion present.     Mouth/Throat:     Mouth: Mucous membranes are moist.      Pharynx: No oropharyngeal exudate or posterior oropharyngeal erythema.  Eyes:     Conjunctiva/sclera: Conjunctivae normal.  Cardiovascular:     Rate and Rhythm: Normal rate and regular rhythm.  Pulmonary:     Effort: Pulmonary effort is normal. No respiratory distress.     Breath sounds: Normal breath sounds. No wheezing, rhonchi or rales.  Skin:    General: Skin is warm and dry.  Neurological:     Mental Status: She is alert.  Psychiatric:        Mood and Affect: Mood normal.        Thought Content: Thought content normal.      UC Treatments / Results  Labs (all labs ordered are listed, but only abnormal results are displayed) Labs Reviewed - No data to display  EKG   Radiology No results found.  Procedures Procedures (including critical care time)  Medications Ordered in UC Medications - No data to display  Initial Impression / Assessment and Plan / UC Course  I have reviewed the triage vital signs and the nursing notes.  Pertinent labs & imaging results that were available during my care of the patient were reviewed by me and considered in my medical decision making (see chart for details).    Given duration of symptoms we will treat to cover sinusitis.  Doxycycline  prescribed given allergy to Augmentin.  Recommended further evaluation if no gradual improvement or with any worsening symptoms.  Okay to continue symptomatic treatment if needed.  Final Clinical Impressions(s) / UC Diagnoses   Final diagnoses:  Acute non-recurrent sinusitis, unspecified location   Discharge Instructions   None    ED Prescriptions     Medication Sig Dispense Auth. Provider   doxycycline  (VIBRAMYCIN ) 100 MG capsule Take 1 capsule (100 mg total) by mouth 2 (two) times daily for 7 days. 14 capsule Vernestine Gondola, PA-C      PDMP not reviewed this encounter.   Vernestine Gondola, PA-C 05/04/24 [phone]

## 2024-07-07 ENCOUNTER — Ambulatory Visit (HOSPITAL_COMMUNITY)
Admission: EM | Admit: 2024-07-07 | Discharge: 2024-07-07 | Disposition: A | Discharge status: Home / Self Care | Attending: Internal Medicine | Admitting: Internal Medicine

## 2024-07-07 ENCOUNTER — Encounter (HOSPITAL_COMMUNITY): Payer: Self-pay | Admitting: *Deleted

## 2024-07-07 DIAGNOSIS — J0110 Acute frontal sinusitis, unspecified: Secondary | ICD-10-CM

## 2024-07-07 DIAGNOSIS — R42 Dizziness and giddiness: Secondary | ICD-10-CM

## 2024-07-07 DIAGNOSIS — R350 Frequency of micturition: Secondary | ICD-10-CM

## 2024-07-07 LAB — POCT URINALYSIS DIP (MANUAL ENTRY)
Bilirubin, UA: NEGATIVE
Blood, UA: NEGATIVE
Glucose, UA: NEGATIVE mg/dL
Ketones, POC UA: NEGATIVE mg/dL
Leukocytes, UA: NEGATIVE
Nitrite, UA: NEGATIVE
Protein Ur, POC: NEGATIVE mg/dL
Spec Grav, UA: 1.02 (ref 1.010–1.025)
Urobilinogen, UA: 0.2 U/dL
pH, UA: 5.5 (ref 5.0–8.0)

## 2024-07-07 MED ORDER — DOXYCYCLINE HYCLATE 100 MG PO CAPS: 100.0000 mg | ORAL_CAPSULE | Freq: Two times a day (BID) | ORAL | 0 refills | Status: DC

## 2024-07-07 NOTE — Discharge Instructions (Addendum)
 Urinalysis done today.  This is negative for an infectious process.  Symptoms are most consistent with a frontal sinus infection.  Your physical exam findings, vital signs and other symptoms are reassuring.  We will treat with the following: Doxycycline  100 mg twice daily for 10 days. Take this with food.  Continue Flonase  as previous. May use Tylenol  as needed for pain or fevers Monitor your symptoms, if the dizziness worsens or fails to resolve then would recommend going to the emergency room for further evaluation

## 2024-07-07 NOTE — ED Triage Notes (Signed)
 Pt states she is dizzy, can't focus, has bilateral flank pain and increased urination X 1 week. She did take some mucinex  D since she had some congestion and tylenol  PRN.

## 2024-07-07 NOTE — ED Provider Notes (Signed)
 [facility]    CSN: [id number] Arrival date & time: 07/07/24  1414      History   Chief Complaint Chief Complaint  Patient presents with   Dizziness   Urinary Frequency   Flank Pain   Eye Problem    HPI Cheryl Gill is a 73 y.o. female.   73 year old female who presents urgent care with complaints of sinus congestion, dizziness with having trouble focusing at times, sinus pressure, and urinary frequency.  She reports the symptoms started about a week ago.  It mostly started with sinus congestion and pressure.  The dizziness is intermittent and when she is having the dizziness she has some trouble focusing.  She is also noted a increase in the amount of time she is urinating.  She denies any dysuria, hematuria, headaches, vision loss, slurred speech, loss of consciousness, falling, fevers, chills.  She does relate that she has some pain in the sinuses above her left eye.  She has had similar symptoms over the last 6 to 8 months and has been diagnosed with sinus infections.  She reports that her symptoms resolve when she has been on antibiotics.  She uses Flonase  and over-the-counter allergy medication daily.   Dizziness Associated symptoms: no chest pain, no palpitations, no shortness of breath and no vomiting   Urinary Frequency Pertinent negatives include no chest pain, no abdominal pain and no shortness of breath.  Flank Pain Pertinent negatives include no chest pain, no abdominal pain and no shortness of breath.  Eye Problem Associated symptoms: no vomiting     Past Medical History:  Diagnosis Date   Acute gout 06/19/2018   Anaphylactic reaction, due to adverse effect of correct medicinal substance properly administered 01/16/2013   Asthma    CAD (coronary artery disease)    a. 12/2014: NSTEMI with cath showing Ostial LAD ~95%, mLAD 80-90%; EF ~40-45%, LAD disease compromised major D1 branch. CABG recommended and performed with LIMA-LAD and SVG-D1.     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   LIMA-LAD, SVG-D1   Seasonal allergies     Patient Active Problem List   Diagnosis Date Noted   Pyelonephritis 12/13/2023   Acute kidney injury superimposed on stage 3b chronic kidney disease (HCC) - baseline Scr 1.4-1.7 12/13/2023   Type 2 diabetes mellitus with complication, with long-term current use of insulin  (HCC) 12/13/2023   CKD (chronic kidney disease), stage III (HCC) 06/18/2018   Joint pain 06/18/2018   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   Coronary artery disease involving native coronary artery without angina pectoris 12/19/2014   H/O NSTEMI (non-ST elevated myocardial infarction) (HCC) 12/18/2014   Obesity, Class II, BMI 35-39.9 12/18/2014   Metabolic syndrome: DM, HTN, Obesity as well as HLD 12/18/2014   Frequency of urination 07/17/2014   Nocturia more than twice per night 07/17/2014   Asthma, chronic 01/28/2014   Hyperlipidemia associated with type 2 diabetes mellitus (HCC) 01/28/2014   Normocytic anemia 01/18/2013   Essential hypertension 01/16/2013   Diabetes mellitus type 2, uncontrolled 01/16/2013  Seasonal allergies     Past Surgical History:  Procedure Laterality Date   ABDOMINAL HYSTERECTOMY     CARDIAC SURGERY     CORONARY ARTERY BYPASS GRAFT N/A 12/21/2014   Procedure: CORONARY ARTERY BYPASS GRAFTING (CABG) TIMES TWO USING LEFT INTERNAL MAMMARY AND RIGHT SAPHENOUS LEG VEIN HARVESTED ENDOSCOPICALLY;  Surgeon: Elspeth JAYSON Millers, MD;  Location: [facility] OR;  Service: Open Heart Surgery;  Laterality: N/A;   LEFT HEART CATHETERIZATION WITH CORONARY  ANGIOGRAM N/A 12/18/2014   Procedure: LEFT HEART CATHETERIZATION WITH CORONARY ANGIOGRAM;  Surgeon: Peter M Swaziland, MD;  Location: [facility] CATH LAB;  Service: Cardiovascular;  Laterality: N/A;   NM MYOCAR PERF WALL MOTION  04/09/2010   Abnormal study - appears to be a small area of apical infarction. Reversible ischemia is not seen.   NM MYOVIEW  LTD  10/2019   No ischemia or infarction.  Small apical defect likely artifact.  LOW RISK.  EF 60 to 65%.   TEE WITHOUT CARDIOVERSION N/A 12/21/2014   Procedure: TRANSESOPHAGEAL ECHOCARDIOGRAM (TEE);  Surgeon: Elspeth JAYSON Millers, MD;  Location: [facility] OR;  Service: Open Heart Surgery;  Laterality: N/A;   TONSILLECTOMY     TRANSTHORACIC ECHOCARDIOGRAM  12/2014   LVEF 60-65%, normal wall thickness, normal wall motion, diastolic dysfunction, indeterminate LV filling pressure, normal LA size.    OB History     Gravida  5   Para  4   Term  4   Preterm      AB  1   Living  4      SAB  1   IAB      Ectopic      Multiple      Live Births  4            Home Medications    Prior to Admission medications   Medication Sig Start Date End Date Taking? Authorizing Provider  allopurinol  (ZYLOPRIM ) 300 MG tablet Take 300 mg by mouth daily.   Yes [provider]  APPLE CIDER VINEGAR-GINGER PO Take 1 capsule by mouth daily.   Yes [provider]  Ascorbic Acid (VITAMIN C PO) Take 1 tablet by mouth daily.   Yes [provider]  aspirin  EC 81 MG tablet Take 81 mg by mouth at bedtime.   Yes [provider]  b complex vitamins capsule Take 1 capsule by mouth daily.   Yes [provider]  cetirizine  (ZYRTEC ) 10 MG tablet Take 10 mg by mouth daily.   Yes [provider]  doxycycline  (VIBRAMYCIN ) 100 MG capsule Take 1 capsule (100 mg total) by mouth 2 (two) times daily. 07/07/24  Yes Jackqueline Aquilar A, PA-C  ELDERBERRY PO Take 1 capsule by mouth daily.   Yes [provider]  gabapentin   (NEURONTIN ) 100 MG capsule Take 100 mg by mouth 2 (two) times daily. 11/16/23  Yes [provider]  glipiZIDE  (GLUCOTROL ) 10 MG tablet Take 10 mg by mouth daily before breakfast.   Yes [provider]  hydrochlorothiazide  (HYDRODIURIL ) 25 MG tablet Take 25 mg by mouth daily.   Yes [provider]  LANTUS  SOLOSTAR 100 UNIT/ML Solostar Pen Inject 35-70 Units into the skin See admin instructions. Inject 70 units into the skin at bedtime if blood sugar over 200. If blood sugar is over 134, inject 35 units. If blood sugar is lower, do not use. 03/15/18  Yes [provider]  latanoprost  (XALATAN ) 0.005 % ophthalmic solution Place 1 drop into the right eye at  bedtime. 10/07/23  Yes [provider]  metoprolol  tartrate (LOPRESSOR ) 25 MG tablet Take 1.5 tablets (37.5 mg total) by mouth 2 (two) times daily. Patient taking differently: Take 25 mg by mouth 2 (two) times daily. 08/05/23 07/07/24 Yes Meng, Hao, PA  MOUNJARO 5 MG/0.5ML Pen Inject 10 mg into the skin once a week. 01/20/23  Yes [provider]  simvastatin  (ZOCOR ) 40 MG tablet Take 40 mg by mouth at bedtime. 02/01/18  Yes [provider]  VITAMIN D PO Take 1 tablet by mouth daily.   Yes [provider]  albuterol  (PROVENTIL  HFA;VENTOLIN  HFA) 108 (90 Base) MCG/ACT inhaler Inhale 2 puffs into the lungs every 4 (four) hours as needed. For shortness of breath or wheezing 01/18/19   Van Knee, MD  Blood Glucose Monitoring Suppl (TRUE METRIX AIR GLUCOSE METER) w/Device KIT  12/28/21   [provider]  carboxymethylcellulose 1 % ophthalmic solution Place 1 drop into the left eye at bedtime.    [provider]  colchicine  0.6 MG tablet Take 0.5 tablets (0.3 mg total) by mouth 2 (two) times daily. Patient taking differently: Take 0.3 mg by mouth 2 (two) times daily as needed (gout flares). 06/20/18   Danford, Lonni SQUIBB, MD  DROPLET PEN NEEDLES 32G X 4 MM MISC  12/28/21    [provider]  fluticasone  (FLONASE ) 50 MCG/ACT nasal spray Place 1 spray into both nostrils daily for 3 days. Patient taking differently: Place 1 spray into both nostrils daily as needed for allergies. 07/25/22 05/04/24  Hazen Darryle BRAVO, FNP  Spacer/Aero-Holding Chambers (AEROCHAMBER PLUS) inhaler Use as instructed 01/18/19   Van Knee, MD  tiZANidine  (ZANAFLEX ) 4 MG tablet Take 4 mg by mouth 2 (two) times daily as needed for muscle spasms. 10/15/23   [provider]  TRELEGY ELLIPTA 100-62.5-25 MCG/ACT AEPB Inhale 1 puff into the lungs daily as needed (shortness of breath). 01/18/23   [provider]  TRUE METRIX BLOOD GLUCOSE TEST test strip 1 each 3 (three) times daily. 06/29/23   [provider]  TRUEplus Lancets 33G MISC Apply 1 each topically 3 (three) times daily. 07/02/23   [provider]    Family History Family History  Problem Relation Age of Onset   Pancreatic cancer Mother    CAD Mother        Open heart surgery 60s-70s   Stroke Father    Heart disease Sister        Unclear details    Social History Social History   Tobacco Use   Smoking status: Never   Smokeless tobacco: Never  Vaping Use   Vaping status: Never Used  Substance Use Topics   Alcohol  use: No   Drug use: No     Allergies   Ace inhibitors, Lisinopril, Augmentin [amoxicillin -pot clavulanate], Mushroom extract complex (obsolete), and Tetanus toxoids   Review of Systems Review of Systems  Constitutional:  Negative for chills and fever.  HENT:  Positive for congestion, sinus pressure and sinus pain. Negative for ear pain and sore throat.   Eyes:  Negative for pain and visual disturbance.  Respiratory:  Negative for cough and shortness of breath.   Cardiovascular:  Negative for chest pain and palpitations.  Gastrointestinal:  Negative for abdominal pain and vomiting.  Genitourinary:  Positive for flank pain and frequency. Negative for dysuria and  hematuria.  Musculoskeletal:  Negative for arthralgias and back pain.  Skin:  Negative for color change and rash.  Neurological:  Positive for  dizziness. Negative for seizures and syncope.  All other systems reviewed and are negative.    Physical Exam Triage Vital Signs ED Triage Vitals  Encounter Vitals Group     BP 07/07/24 1456 121/81     Girls Systolic BP Percentile --      Girls Diastolic BP Percentile --      Boys Systolic BP Percentile --      Boys Diastolic BP Percentile --      Pulse Rate 07/07/24 1456 67     Resp 07/07/24 1456 18     Temp 07/07/24 1456 98.4 F (36.9 C)     Temp Source 07/07/24 1456 Oral     SpO2 07/07/24 1456 96 %     Weight --      Height --      Head Circumference --      Peak Flow --      Pain Score 07/07/24 1453 5     Pain Loc --      Pain Education --      Exclude from Growth Chart --    No data found.  Updated Vital Signs BP 121/81 (BP Location: Left Arm)   Pulse 67   Temp 98.4 F (36.9 C) (Oral)   Resp 18   SpO2 96%   Visual Acuity Right Eye Distance: 20/30 Left Eye Distance: 20/25 Bilateral Distance: 20/25  Right Eye Near:   Left Eye Near:    Bilateral Near:     Physical Exam Vitals and nursing note reviewed.  Constitutional:      General: She is not in acute distress.    Appearance: She is well-developed.  HENT:     Head: Normocephalic and atraumatic.     Right Ear: Tympanic membrane normal.     Left Ear: Tympanic membrane normal.     Nose: Mucosal edema and congestion present.     Right Sinus: No maxillary sinus tenderness or frontal sinus tenderness.     Left Sinus: Frontal sinus tenderness present. No maxillary sinus tenderness.     Mouth/Throat:     Mouth: Mucous membranes are moist.     Pharynx: No oropharyngeal exudate or posterior oropharyngeal erythema.  Eyes:     Extraocular Movements: Extraocular movements intact.     Conjunctiva/sclera: Conjunctivae normal.     Pupils: Pupils are equal, round, and  reactive to light.  Cardiovascular:     Rate and Rhythm: Normal rate and regular rhythm.     Heart sounds: No murmur heard. Pulmonary:     Effort: Pulmonary effort is normal. No respiratory distress.     Breath sounds: Normal breath sounds. No wheezing or rhonchi.  Abdominal:     Palpations: Abdomen is soft.     Tenderness: There is no abdominal tenderness.  Musculoskeletal:        General: No swelling.     Cervical back: Neck supple.  Skin:    General: Skin is warm and dry.     Capillary Refill: Capillary refill takes less than 2 seconds.  Neurological:     General: No focal deficit present.     Mental Status: She is alert and oriented to person, place, and time.     Cranial Nerves: No cranial nerve deficit.     Sensory: No sensory deficit.     Coordination: Coordination normal.     Gait: Gait normal.  Psychiatric:        Mood and Affect: Mood normal.  Behavior: Behavior normal.        Thought Content: Thought content normal.        Judgment: Judgment normal.      UC Treatments / Results  Labs (all labs ordered are listed, but only abnormal results are displayed) Labs Reviewed  POCT URINALYSIS DIP (MANUAL ENTRY)    EKG   Radiology No results found.  Procedures Procedures (including critical care time)  Medications Ordered in UC Medications - No data to display  Initial Impression / Assessment and Plan / UC Course  I have reviewed the triage vital signs and the nursing notes.  Pertinent labs & imaging results that were available during my care of the patient were reviewed by me and considered in my medical decision making (see chart for details).     Acute non-recurrent frontal sinusitis  Dizziness  Urinary frequency   Urinalysis done today.  This is negative for an infectious process.  Symptoms are most consistent with a frontal sinus infection.  Your physical exam findings, vital signs and other symptoms are reassuring.  We will treat with the  following: Doxycycline  100 mg twice daily for 10 days. Take this with food.  Continue Flonase  as previous. May use Tylenol  as needed for pain or fevers Monitor your symptoms, if the dizziness worsens or fails to resolve then would recommend going to the emergency room for further evaluation  Final Clinical Impressions(s) / UC Diagnoses   Final diagnoses:  Acute non-recurrent frontal sinusitis  Dizziness  Urinary frequency     Discharge Instructions      Urinalysis done today.  This is negative for an infectious process.  Symptoms are most consistent with a frontal sinus infection.  Your physical exam findings, vital signs and other symptoms are reassuring.  We will treat with the following: Doxycycline  100 mg twice daily for 10 days. Take this with food.  Continue Flonase  as previous. May use Tylenol  as needed for pain or fevers Monitor your symptoms, if the dizziness worsens or fails to resolve then would recommend going to the emergency room for further evaluation    ED Prescriptions     Medication Sig Dispense Auth. Provider   doxycycline  (VIBRAMYCIN ) 100 MG capsule Take 1 capsule (100 mg total) by mouth 2 (two) times daily. 20 capsule Teresa Almarie LABOR, [place]      PDMP not reviewed this encounter.   Teresa Almarie LABOR, [place] 07/07/24 1634

## 2024-09-11 ENCOUNTER — Other Ambulatory Visit: Payer: Self-pay

## 2024-09-11 ENCOUNTER — Ambulatory Visit (HOSPITAL_COMMUNITY): Payer: Self-pay | Admitting: Physician Assistant

## 2024-09-11 ENCOUNTER — Encounter (HOSPITAL_COMMUNITY): Payer: Self-pay | Admitting: Emergency Medicine

## 2024-09-11 ENCOUNTER — Ambulatory Visit (INDEPENDENT_AMBULATORY_CARE_PROVIDER_SITE_OTHER)

## 2024-09-11 ENCOUNTER — Ambulatory Visit (HOSPITAL_COMMUNITY)
Admission: EM | Admit: 2024-09-11 | Discharge: 2024-09-11 | Disposition: A | Discharge status: Home / Self Care | Attending: Physician Assistant | Admitting: Physician Assistant

## 2024-09-11 DIAGNOSIS — R0781 Pleurodynia: Secondary | ICD-10-CM | POA: Insufficient documentation

## 2024-09-11 DIAGNOSIS — M25552 Pain in left hip: Secondary | ICD-10-CM | POA: Diagnosis not present

## 2024-09-11 DIAGNOSIS — R0981 Nasal congestion: Secondary | ICD-10-CM | POA: Insufficient documentation

## 2024-09-11 DIAGNOSIS — R350 Frequency of micturition: Secondary | ICD-10-CM | POA: Diagnosis present

## 2024-09-11 LAB — POCT URINALYSIS DIP (MANUAL ENTRY)
Bilirubin, UA: NEGATIVE
Blood, UA: NEGATIVE
Glucose, UA: NEGATIVE mg/dL
Ketones, POC UA: NEGATIVE mg/dL
Nitrite, UA: NEGATIVE
Spec Grav, UA: 1.025 (ref 1.010–1.025)
Urobilinogen, UA: 0.2 U/dL
pH, UA: 5.5 (ref 5.0–8.0)

## 2024-09-11 LAB — POC SARS CORONAVIRUS 2 AG -  ED: SARS Coronavirus 2 Ag: NEGATIVE

## 2024-09-11 MED ORDER — LIDOCAINE 5 % EX PTCH: 1.0000 | MEDICATED_PATCH | CUTANEOUS | 0 refills | Status: AC

## 2024-09-11 NOTE — ED Provider Notes (Signed)
 [facility]    CSN: [id number] Arrival date & time: 09/11/24  1055      History   Chief Complaint No chief complaint on file.   HPI Cheryl Gill is a 73 y.o. female.   Patient presents today with several concerns.  Her primary concern today is left-sided hip and back pain.  She reports symptoms have been ongoing for over a week without identifiable trigger or known injury.  She reports that pain is rated 7 on a 0-10 pain scale, worse with ambulation, no alleviating factors notified.  She denies known history of osteo or rheumatoid arthritis.  She denies previous injury or surgery involving her hip.  She is able to ambulate unassisted and denies any numbness or paresthesias in her foot.  She is having difficulty with her daily duties as a result of symptoms.  She has tried Tylenol  without improvement.  She is unable to take NSAIDs due to history of chronic kidney disease.  She denies any bowel/bladder continence, lower extremity weakness, saddle anesthesia.  In addition, she reports a 4 to 5-day history of URI symptoms including congestion, hoarseness, postnasal drainage.  She has had COVID with last episode over a year ago.  She has had COVID vaccines.  She does have a history of allergies and has been taking cetirizine  as well as fluticasone  nasal spray without improvement of symptoms.  She denies any recent antibiotics or steroids.  She does have a history of asthma but has not been using her albuterol  since symptoms began; reports compliance with Trelegy.  Denies any known sick contacts.  In addition, patient reports a prolonged history (several weeks) of urinary frequency.  She does report occasional urgency but denies any dysuria, hematuria, lower abdominal pain, fever, nausea, vomiting.  Denies history of recurrent urinary tract infections or recent antibiotic use.  She does have a history of diabetes but does not take SGLT2 inhibitor.  Patient also reports pain in  her right rib cage.  She reports that she was cleaning the tub at her house when she leaned across the edge of the tub and felt a crack in her rib cage.  She has had ongoing pain since that time.  Reports injury occurred about 5 to 6 days ago.  She does report pleuritic pain in her lateral rib cage but denies any shortness of breath.  She has been taking Tylenol  without improvement of symptoms.    Past Medical History:  Diagnosis Date   Acute gout 06/19/2018   Anaphylactic reaction, due to adverse effect of correct medicinal substance properly administered 01/16/2013   Asthma    CAD (coronary artery disease)    a. 12/2014: NSTEMI with cath showing Ostial LAD ~95%, mLAD 80-90%; EF ~40-45%, LAD disease compromised major D1 branch. CABG recommended and performed with LIMA-LAD and SVG-D1.    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   LIMA-LAD, SVG-D1   Seasonal allergies     Patient Active Problem List   Diagnosis Date Noted   Pyelonephritis 12/13/2023  Acute kidney injury superimposed on stage 3b chronic kidney disease (HCC) - baseline Scr 1.4-1.7 12/13/2023   Type 2 diabetes mellitus with complication, with long-term current use of insulin  (HCC) 12/13/2023   CKD (chronic kidney disease), stage III (HCC) 06/18/2018   Joint pain 06/18/2018   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   Coronary artery disease involving native coronary artery without angina pectoris 12/19/2014   H/O NSTEMI (non-ST elevated myocardial infarction) (HCC) 12/18/2014   Obesity, Class II, BMI 35-39.9 12/18/2014   Metabolic syndrome: DM, HTN, Obesity  as well as HLD 12/18/2014   Frequency of urination 07/17/2014   Nocturia more than twice per night 07/17/2014   Asthma, chronic 01/28/2014   Hyperlipidemia associated with type 2 diabetes mellitus (HCC) 01/28/2014   Normocytic anemia 01/18/2013   Essential hypertension 01/16/2013   Diabetes mellitus type 2, uncontrolled 01/16/2013   Seasonal allergies     Past Surgical History:  Procedure Laterality Date   ABDOMINAL HYSTERECTOMY     CARDIAC SURGERY     CORONARY ARTERY BYPASS GRAFT N/A 12/21/2014   Procedure: CORONARY ARTERY BYPASS GRAFTING (CABG) TIMES TWO USING LEFT INTERNAL MAMMARY AND RIGHT SAPHENOUS LEG VEIN HARVESTED ENDOSCOPICALLY;  Surgeon: Elspeth JAYSON Millers, MD;  Location: [facility] OR;  Service: Open Heart Surgery;  Laterality: N/A;   LEFT HEART CATHETERIZATION WITH CORONARY ANGIOGRAM N/A 12/18/2014   Procedure: LEFT HEART CATHETERIZATION WITH CORONARY ANGIOGRAM;  Surgeon: Peter M Swaziland, MD;  Location: [facility] CATH LAB;  Service: Cardiovascular;  Laterality: N/A;   NM MYOCAR PERF WALL MOTION  04/09/2010   Abnormal study - appears to be a small area of apical infarction. Reversible ischemia is not seen.   NM MYOVIEW  LTD  10/2019   No ischemia or infarction.  Small apical defect likely artifact.  LOW RISK.  EF 60 to 65%.   TEE WITHOUT CARDIOVERSION N/A 12/21/2014   Procedure: TRANSESOPHAGEAL ECHOCARDIOGRAM (TEE);  Surgeon: Elspeth JAYSON Millers, MD;  Location: [facility] OR;  Service: Open Heart Surgery;  Laterality: N/A;   TONSILLECTOMY     TRANSTHORACIC ECHOCARDIOGRAM  12/2014   LVEF 60-65%, normal wall thickness, normal wall motion, diastolic dysfunction, indeterminate LV filling pressure, normal LA size.    OB History     Gravida  5   Para  4   Term  4   Preterm      AB  1   Living  4      SAB  1   IAB      Ectopic      Multiple      Live Births  4            Home Medications    Prior to Admission medications   Medication Sig Start Date End Date Taking?  Authorizing Provider  lidocaine  (LIDODERM ) 5 % Place 1 patch onto the skin daily. Remove & Discard patch within 12 hours or as directed by MD 09/11/24  Yes Klynn Linnemann, Rocky K, PA-C  albuterol  (PROVENTIL  HFA;VENTOLIN  HFA) 108 (90 Base) MCG/ACT inhaler Inhale 2 puffs into the lungs every 4 (four) hours as needed. For shortness of breath or wheezing 01/18/19   Van Knee, MD  allopurinol  (ZYLOPRIM ) 300 MG tablet Take 300 mg by mouth daily.    [provider]  APPLE CIDER VINEGAR-GINGER PO Take 1 capsule by mouth daily.    [provider]  Ascorbic Acid (VITAMIN C PO) Take 1 tablet by mouth daily.    [provider]  aspirin  EC 81 MG tablet Take  81 mg by mouth at bedtime.    [provider]  b complex vitamins capsule Take 1 capsule by mouth daily.    [provider]  Blood Glucose Monitoring Suppl (TRUE METRIX AIR GLUCOSE METER) w/Device KIT  12/28/21   [provider]  carboxymethylcellulose 1 % ophthalmic solution Place 1 drop into the left eye at bedtime.    [provider]  cetirizine  (ZYRTEC ) 10 MG tablet Take 10 mg by mouth daily.    [provider]  colchicine  0.6 MG tablet Take 0.5 tablets (0.3 mg total) by mouth 2 (two) times daily. Patient taking differently: Take 0.3 mg by mouth 2 (two) times daily as needed (gout flares). 06/20/18   Danford, Lonni SQUIBB, MD  DROPLET PEN NEEDLES 32G X 4 MM MISC  12/28/21   [provider]  ELDERBERRY PO Take 1 capsule by mouth daily.    [provider]  fluticasone  (FLONASE ) 50 MCG/ACT nasal spray Place 1 spray into both nostrils daily for 3 days. Patient taking differently: Place 1 spray into both nostrils daily as needed for allergies. 07/25/22 05/04/24  Hazen Darryle BRAVO, FNP  gabapentin  (NEURONTIN ) 100 MG capsule Take 100 mg by mouth 2 (two) times daily. 11/16/23   [provider]  glipiZIDE  (GLUCOTROL ) 10 MG tablet Take 10 mg by mouth daily before breakfast.     [provider]  hydrochlorothiazide  (HYDRODIURIL ) 25 MG tablet Take 25 mg by mouth daily.    [provider]  LANTUS  SOLOSTAR 100 UNIT/ML Solostar Pen Inject 35-70 Units into the skin See admin instructions. Inject 70 units into the skin at bedtime if blood sugar over 200. If blood sugar is over 134, inject 35 units. If blood sugar is lower, do not use. 03/15/18   [provider]  latanoprost  (XALATAN ) 0.005 % ophthalmic solution Place 1 drop into the right eye at bedtime. 10/07/23   [provider]  metoprolol  tartrate (LOPRESSOR ) 25 MG tablet Take 1.5 tablets (37.5 mg total) by mouth 2 (two) times daily. Patient taking differently: Take 25 mg by mouth 2 (two) times daily. 08/05/23 07/07/24  Meng, Hao, PA  MOUNJARO 5 MG/0.5ML Pen Inject 10 mg into the skin once a week. 01/20/23   [provider]  simvastatin  (ZOCOR ) 40 MG tablet Take 40 mg by mouth at bedtime. 02/01/18   [provider]  Spacer/Aero-Holding Chambers (AEROCHAMBER PLUS) inhaler Use as instructed 01/18/19   Van Knee, MD  tiZANidine  (ZANAFLEX ) 4 MG tablet Take 4 mg by mouth 2 (two) times daily as needed for muscle spasms. 10/15/23   [provider]  TRELEGY ELLIPTA 100-62.5-25 MCG/ACT AEPB Inhale 1 puff into the lungs daily as needed (shortness of breath). 01/18/23   [provider]  TRUE METRIX BLOOD GLUCOSE TEST test strip 1 each 3 (three) times daily. 06/29/23   [provider]  TRUEplus Lancets 33G MISC Apply 1 each topically 3 (three) times daily. 07/02/23   [provider]  VITAMIN D PO Take 1 tablet by mouth daily.    [provider]    Family History Family History  Problem Relation Age of Onset   Pancreatic cancer Mother    CAD Mother        Open heart surgery 60s-70s   Stroke Father    Heart disease Sister        Unclear details    Social History Social History   Tobacco Use   Smoking status: Never   Smokeless  tobacco:  Never  Vaping Use   Vaping status: Never Used  Substance Use Topics   Alcohol  use: No   Drug use: No     Allergies   Ace inhibitors, Lisinopril, Augmentin [amoxicillin -pot clavulanate], Mushroom extract complex (obsolete), and Tetanus toxoid-containing vaccines   Review of Systems Review of Systems  Constitutional:  Positive for activity change. Negative for appetite change, fatigue and fever.  Respiratory:  Negative for shortness of breath.   Cardiovascular:  Negative for chest pain, palpitations and leg swelling.  Gastrointestinal:  Negative for abdominal pain, diarrhea, nausea and vomiting.  Genitourinary:  Positive for frequency and urgency. Negative for dysuria, vaginal bleeding, vaginal discharge and vaginal pain.  Musculoskeletal:  Positive for back pain and myalgias. Negative for arthralgias.  Neurological:  Negative for dizziness, light-headedness and headaches.     Physical Exam Triage Vital Signs ED Triage Vitals  Encounter Vitals Group     BP 09/11/24 1204 121/83     Girls Systolic BP Percentile --      Girls Diastolic BP Percentile --      Boys Systolic BP Percentile --      Boys Diastolic BP Percentile --      Pulse Rate 09/11/24 1204 72     Resp 09/11/24 1204 20     Temp 09/11/24 1204 98.5 F (36.9 C)     Temp Source 09/11/24 1204 Oral     SpO2 09/11/24 1204 97 %     Weight --      Height --      Head Circumference --      Peak Flow --      Pain Score 09/11/24 1201 8     Pain Loc --      Pain Education --      Exclude from Growth Chart --    No data found.  Updated Vital Signs BP 121/83 (BP Location: Right Arm) Comment (BP Location): large cuff  Pulse 72   Temp 98.5 F (36.9 C) (Oral)   Resp 20   SpO2 97%   Visual Acuity Right Eye Distance:   Left Eye Distance:   Bilateral Distance:    Right Eye Near:   Left Eye Near:    Bilateral Near:     Physical Exam Vitals reviewed.  Constitutional:      General: She is awake. She is  not in acute distress.    Appearance: Normal appearance. She is well-developed. She is not ill-appearing.     Comments: Very pleasant female appears stated age in no acute distress sitting comfortably in exam room  HENT:     Head: Normocephalic and atraumatic.     Right Ear: Tympanic membrane, ear canal and external ear normal. Tympanic membrane is not erythematous or bulging.     Left Ear: Tympanic membrane, ear canal and external ear normal. Tympanic membrane is not erythematous or bulging.     Nose: Nose normal.     Mouth/Throat:     Pharynx: Uvula midline. Postnasal drip present. No oropharyngeal exudate or posterior oropharyngeal erythema.  Cardiovascular:     Rate and Rhythm: Normal rate and regular rhythm.     Heart sounds: Normal heart sounds, S1 normal and S2 normal. No murmur heard. Pulmonary:     Effort: Pulmonary effort is normal.     Breath sounds: Normal breath sounds. No wheezing, rhonchi or rales.     Comments: Clear to auscultation bilaterally Chest:     Chest wall: Tenderness present. No deformity or swelling.  Comments: Tenderness over right lateral rib cage and mid axillary line without deformity. Musculoskeletal:     Cervical back: Normal range of motion and neck supple. No tenderness or bony tenderness.     Thoracic back: No tenderness or bony tenderness.     Lumbar back: Tenderness present. No bony tenderness. Negative right straight leg raise test and negative left straight leg raise test.     Left hip: Tenderness present. No bony tenderness. Normal range of motion. Normal strength.     Comments: Back: Mild tenderness palpation of left lumbar paraspinal muscles.  No deformity or step-off noted.  No pain with percussion of vertebrae.  Strength 5/5 bilateral lower extremities.  Left hip: Tender to palpation over anterior and lateral left hip without deformity.  Normal active and passive range of motion.  Strength 5/5.  Skin:    Findings: No rash.  Psychiatric:         Behavior: Behavior is cooperative.      UC Treatments / Results  Labs (all labs ordered are listed, but only abnormal results are displayed) Labs Reviewed  POCT URINALYSIS DIP (MANUAL ENTRY) - Abnormal; Notable for the following components:      Result Value   Protein Ur, POC trace (*)    Leukocytes, UA Trace (*)    All other components within normal limits  URINE CULTURE  POC SARS CORONAVIRUS 2 AG -  ED    EKG   Radiology DG Ribs Unilateral W/Chest Right Result Date: 09/11/2024 CLINICAL DATA:  Right rib and upper back pain EXAM: RIGHT RIBS AND CHEST - 3+ VIEW COMPARISON:  03/22/2018 FINDINGS: Frontal view of the chest as well as frontal and oblique views of the right thoracic cage are obtained. Postsurgical changes from previous CABG. The cardiac silhouette is unremarkable. No acute airspace disease, effusion, or pneumothorax. There are no acute or destructive bony abnormalities. No displaced rib fractures. IMPRESSION: 1. No acute intrathoracic process.  No displaced rib fracture. Electronically Signed   By: Ozell Daring M.D.   On: 09/11/2024 13:24   DG Hip Unilat With Pelvis 2-3 Views Left Result Date: 09/11/2024 CLINICAL DATA:  Left leg and buttock pain for 4-5 days EXAM: DG HIP (WITH OR WITHOUT PELVIS) 2-3V LEFT COMPARISON:  06/14/2018 FINDINGS: Frontal view of the pelvis as well as frontal and frogleg lateral views of the left hip are obtained. No acute displaced fracture, subluxation, or dislocation. Mild symmetrical bilateral hip osteoarthritis. The sacroiliac joints are unremarkable. Soft tissues are normal. IMPRESSION: 1. Mild symmetrical bilateral hip osteoarthritis. 2. No acute displaced fracture. Electronically Signed   By: Ozell Daring M.D.   On: 09/11/2024 13:21    Procedures Procedures (including critical care time)  Medications Ordered in UC Medications - No data to display  Initial Impression / Assessment and Plan / UC Course  I have reviewed the triage  vital signs and the nursing notes.  Pertinent labs & imaging results that were available during my care of the patient were reviewed by me and considered in my medical decision making (see chart for details).     Patient is well-appearing, afebrile, nontoxic, nontachycardic.  She denies any alarm symptoms that warrant emergent evaluation or imaging of her lumbar spine.  X-ray of hip was obtained as this was the center of tenderness on exam that showed osteoarthritis without acute abnormality.  We discussed that this could be contributing to her symptoms and recommend she continue Tylenol  over-the-counter for pain relief.  She was encouraged to  follow-up with sports medicine if her symptoms or not improving was given the contact information for local provider.  We discussed that if she has any worsening or changing symptoms she needs to be seen immediately.  Strict return precautions given.  Given her nasal congestion has only been present for 4 to 5 days I did test her for COVID and this was negative.  We discussed that symptoms are likely related to viral illness versus seasonal allergies.  Recommend she continue the allergy medication as well as use nasal saline sinus rinses as well as gargle with warm salt water  for additional symptom relief.  If her symptoms are worsening in any way she needs to be seen immediately.  Strict return precautions given.  All questions answered to patient satisfaction.  Her UA did have trace leuks so we will send this for culture but defer antibiotics until culture results are available.  Recommend that she push fluids and if she continues to have urinary symptoms with a negative culture she should follow-up with her primary care for additional evaluation.  Discussed that if anything worsens she needs to return for reevaluation.  X-ray of the ribs and chest were obtained that showed no acute osseous abnormality.  Suspect contusion of ribs as etiology of symptoms and she  was started on lidocaine  patches for additional symptom relief.  She can use Tylenol  for pain as well.  We discussed the importance of breathing exercises for appropriate pulmonary hygiene and if she has any worsening or changing symptoms she is to return for reevaluation.  Strict turn precautions given.  Recommend close follow-up with her primary care.  Encouraged her to call to schedule appoint within a week to ensure that she is improving with our conservative treatment measures and to arrange additional follow-up.  All questions were answered patient satisfaction.    Final Clinical Impressions(s) / UC Diagnoses   Final diagnoses:  Nasal congestion  Urinary frequency  Left hip pain  Rib pain on right side     Discharge Instructions      You were negative for COVID.  I suspect your symptoms are related to allergies.  Continue your allergy medication and gargle with warm salt water  as as well as use nasal saline/sinus rinses for additional symptom relief.  If your symptoms have not gone away within 3 to 5 days or if anything worsens please return for reevaluation.  Your urine was not convincing for a urinary tract infection.  We will send this for culture and contact you if needed start antibiotic.  Make sure you drink plenty of fluid.  If you develop any abdominal pain, fever, burning when you pee, blood in your urine you need to be seen immediately.  I saw some arthritis on your x-ray but nothing that was broken or out of place.  This is good news.  I will contact you if the radiologist sees something that I did not.  Continue your Tylenol  for pain relief.  Follow-up with sports medicine if your symptoms are not improving.  I did not see any broken ribs on your x-ray.  I will contact you if the radiologist sees something I did not.  Apply lidocaine  patch for 12 hours during the day and then remove this for 12 hours at night using only 1 patch for 24 hours.  Make sure that you are taking  big deep breaths to prevent issues with your lungs related to the pain.  If anything worsens or changes please return  for reevaluation including fever, cough, shortness of breath, additional pain.  Follow-up with your primary care within a week.     ED Prescriptions     Medication Sig Dispense Auth. Provider   lidocaine  (LIDODERM ) 5 % Place 1 patch onto the skin daily. Remove & Discard patch within 12 hours or as directed by MD 10 patch Vasil Juhasz K, PA-C      PDMP not reviewed this encounter.   Sherrell Rocky POUR, PA-C 09/11/24 1356

## 2024-09-11 NOTE — Discharge Instructions (Signed)
 You were negative for COVID.  I suspect your symptoms are related to allergies.  Continue your allergy medication and gargle with warm salt water  as as well as use nasal saline/sinus rinses for additional symptom relief.  If your symptoms have not gone away within 3 to 5 days or if anything worsens please return for reevaluation.  Your urine was not convincing for a urinary tract infection.  We will send this for culture and contact you if needed start antibiotic.  Make sure you drink plenty of fluid.  If you develop any abdominal pain, fever, burning when you pee, blood in your urine you need to be seen immediately.  I saw some arthritis on your x-ray but nothing that was broken or out of place.  This is good news.  I will contact you if the radiologist sees something that I did not.  Continue your Tylenol  for pain relief.  Follow-up with sports medicine if your symptoms are not improving.  I did not see any broken ribs on your x-ray.  I will contact you if the radiologist sees something I did not.  Apply lidocaine  patch for 12 hours during the day and then remove this for 12 hours at night using only 1 patch for 24 hours.  Make sure that you are taking big deep breaths to prevent issues with your lungs related to the pain.  If anything worsens or changes please return for reevaluation including fever, cough, shortness of breath, additional pain.  Follow-up with your primary care within a week.

## 2024-09-11 NOTE — ED Triage Notes (Signed)
 Reports pain in left leg where leg joins body on left side.  Now pain is in left buttocks.  Pain in mid to upper back and radiate down left leg.  Onset 4-5 days ago.  Has had random days of urinary symptoms: burning and frequency.    Has had tylenol 

## 2024-09-12 LAB — URINE CULTURE: Culture: NO GROWTH

## 2024-11-03 ENCOUNTER — Encounter (HOSPITAL_COMMUNITY): Payer: Self-pay

## 2024-11-03 ENCOUNTER — Ambulatory Visit (HOSPITAL_COMMUNITY)
Admission: EM | Admit: 2024-11-03 | Discharge: 2024-11-03 | Disposition: A | Discharge status: Home / Self Care | Attending: Physician Assistant | Admitting: Physician Assistant

## 2024-11-03 DIAGNOSIS — K0889 Other specified disorders of teeth and supporting structures: Secondary | ICD-10-CM | POA: Diagnosis not present

## 2024-11-03 DIAGNOSIS — K047 Periapical abscess without sinus: Secondary | ICD-10-CM

## 2024-11-03 MED ORDER — CLINDAMYCIN HCL 300 MG PO CAPS: 300.0000 mg | ORAL_CAPSULE | Freq: Three times a day (TID) | ORAL | 0 refills | Status: AC

## 2024-11-03 NOTE — ED Triage Notes (Signed)
 Patient here today with c/o right upper dental pain X 2 weeks. Patient has been taking Tylenol  and rinsing her mouth out with peroxide with some relief. Patient states that she has a tooth that is rotten.

## 2024-11-03 NOTE — ED Provider Notes (Signed)
 [facility]    CSN: [id number] Arrival date & time: 11/03/24  0807      History   Chief Complaint Chief Complaint  Patient presents with   Dental Pain    HPI Cheryl Gill is a 73 y.o. female.   Patient presents today with a several week history of right upper tooth discomfort.  She has a known broken tooth in this region and has not seen a dentist about this recently.  Over the past few weeks it has become more sore and she is having difficulty eating on the side of her mouth.  She is eating and drinking appropriately and has no concern for dehydration.  She has had some mild nausea but denies any vomiting, fever, swelling of her throat, shortness of breath, muffled voice.  Denies any recent antibiotic use.  She has been taking Tylenol  with minimal improvement of symptoms.  She denies any recent dental procedure or extraction and does not currently follow with a dentist.  Her pain is rated 7 on a 0-10 pain scale, described as aching, no alleviating factors identified.   Dental Pain Associated symptoms: no fever     Past Medical History:  Diagnosis Date   Acute gout 06/19/2018   Anaphylactic reaction, due to adverse effect of correct medicinal substance properly administered 01/16/2013   Asthma    CAD (coronary artery disease)    a. 12/2014: NSTEMI with cath showing Ostial LAD ~95%, mLAD 80-90%; EF ~40-45%, LAD disease compromised major D1 branch. CABG recommended and performed with LIMA-LAD and SVG-D1.    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   LIMA-LAD, SVG-D1   Seasonal  allergies     Patient Active Problem List   Diagnosis Date Noted   Pyelonephritis 12/13/2023   Acute kidney injury superimposed on stage 3b chronic kidney disease (HCC) - baseline Scr 1.4-1.7 12/13/2023   Type 2 diabetes mellitus with complication, with long-term current use of insulin  (HCC) 12/13/2023   CKD (chronic kidney disease), stage III (HCC) 06/18/2018   Joint pain 06/18/2018   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   Coronary artery disease involving native coronary artery without angina pectoris 12/19/2014   H/O NSTEMI (non-ST elevated myocardial infarction) (HCC) 12/18/2014   Obesity, Class II, BMI 35-39.9 12/18/2014   Metabolic syndrome: DM, HTN, Obesity as well as HLD 12/18/2014   Frequency of urination 07/17/2014   Nocturia more than twice per night 07/17/2014   Asthma, chronic 01/28/2014   Hyperlipidemia associated with type 2 diabetes mellitus (HCC) 01/28/2014   Normocytic anemia 01/18/2013   Essential hypertension 01/16/2013   Diabetes mellitus type 2, uncontrolled 01/16/2013   Seasonal allergies     Past Surgical History:  Procedure Laterality Date   ABDOMINAL HYSTERECTOMY     CARDIAC SURGERY     CORONARY ARTERY BYPASS GRAFT N/A 12/21/2014   Procedure: CORONARY ARTERY BYPASS GRAFTING (CABG) TIMES TWO USING LEFT INTERNAL MAMMARY AND RIGHT SAPHENOUS LEG VEIN HARVESTED ENDOSCOPICALLY;  Surgeon: Elspeth JAYSON Millers, MD;  Location: [facility] OR;  Service: Open Heart  Surgery;  Laterality: N/A;   LEFT HEART CATHETERIZATION WITH CORONARY ANGIOGRAM N/A 12/18/2014   Procedure: LEFT HEART CATHETERIZATION WITH CORONARY ANGIOGRAM;  Surgeon: Peter M Jordan, MD;  Location: [facility] CATH LAB;  Service: Cardiovascular;  Laterality: N/A;   NM MYOCAR PERF WALL MOTION  04/09/2010   Abnormal study - appears to be a small area of apical infarction. Reversible ischemia is not seen.   NM MYOVIEW  LTD  10/2019   No ischemia or infarction.  Small apical defect likely artifact.  LOW RISK.  EF 60 to 65%.   TEE  WITHOUT CARDIOVERSION N/A 12/21/2014   Procedure: TRANSESOPHAGEAL ECHOCARDIOGRAM (TEE);  Surgeon: Elspeth JAYSON Millers, MD;  Location: [facility] OR;  Service: Open Heart Surgery;  Laterality: N/A;   TONSILLECTOMY     TRANSTHORACIC ECHOCARDIOGRAM  12/2014   LVEF 60-65%, normal wall thickness, normal wall motion, diastolic dysfunction, indeterminate LV filling pressure, normal LA size.    OB History     Gravida  5   Para  4   Term  4   Preterm      AB  1   Living  4      SAB  1   IAB      Ectopic      Multiple      Live Births  4            Home Medications    Prior to Admission medications   Medication Sig Start Date End Date Taking? Authorizing Provider  clindamycin  (CLEOCIN ) 300 MG capsule Take 1 capsule (300 mg total) by mouth 3 (three) times daily. 11/03/24  Yes Sherrye Puga K, PA-C  albuterol  (PROVENTIL  HFA;VENTOLIN  HFA) 108 (90 Base) MCG/ACT inhaler Inhale 2 puffs into the lungs every 4 (four) hours as needed. For shortness of breath or wheezing 01/18/19   Van Knee, MD  allopurinol  (ZYLOPRIM ) 300 MG tablet Take 300 mg by mouth daily.    [provider]  APPLE CIDER VINEGAR-GINGER PO Take 1 capsule by mouth daily.    [provider]  Ascorbic Acid (VITAMIN C PO) Take 1 tablet by mouth daily.    [provider]  aspirin  EC 81 MG tablet Take 81 mg by mouth at bedtime.    [provider]  b complex vitamins capsule Take 1 capsule by mouth daily.    [provider]  Blood Glucose Monitoring Suppl (TRUE METRIX AIR GLUCOSE METER) w/Device KIT  12/28/21   [provider]  carboxymethylcellulose 1 % ophthalmic solution Place 1 drop into the left eye at bedtime.    [provider]  cetirizine  (ZYRTEC ) 10 MG tablet Take 10 mg by mouth daily.    [provider]  colchicine  0.6 MG tablet Take 0.5 tablets (0.3 mg total) by mouth 2 (two) times daily. Patient taking differently: Take 0.3 mg by mouth 2  (two) times daily as needed (gout flares). 06/20/18   Danford, Lonni SQUIBB, MD  DROPLET PEN NEEDLES 32G X 4 MM MISC  12/28/21   [provider]  ELDERBERRY PO Take 1 capsule by mouth daily.    [provider]  fluticasone  (FLONASE ) 50 MCG/ACT nasal spray Place 1 spray into both nostrils daily for 3 days. Patient taking differently: Place 1 spray into both nostrils daily as needed for allergies. 07/25/22 05/04/24  Hazen Darryle BRAVO, FNP  gabapentin  (NEURONTIN ) 100 MG capsule Take 100 mg by mouth 2 (two) times daily. 11/16/23   [provider]  glipiZIDE  (GLUCOTROL ) 10 MG  tablet Take 10 mg by mouth daily before breakfast.    [provider]  hydrochlorothiazide  (HYDRODIURIL ) 25 MG tablet Take 25 mg by mouth daily.    [provider]  LANTUS  SOLOSTAR 100 UNIT/ML Solostar Pen Inject 35-70 Units into the skin See admin instructions. Inject 70 units into the skin at bedtime if blood sugar over 200. If blood sugar is over 134, inject 35 units. If blood sugar is lower, do not use. 03/15/18   [provider]  latanoprost  (XALATAN ) 0.005 % ophthalmic solution Place 1 drop into the right eye at bedtime. 10/07/23   [provider]  lidocaine  (LIDODERM ) 5 % Place 1 patch onto the skin daily. Remove & Discard patch within 12 hours or as directed by MD 09/11/24   Blimy Napoleon, Rocky POUR, PA-C  metoprolol  tartrate (LOPRESSOR ) 25 MG tablet Take 1.5 tablets (37.5 mg total) by mouth 2 (two) times daily. Patient taking differently: Take 25 mg by mouth 2 (two) times daily. 08/05/23 07/07/24  Meng, Hao, PA  MOUNJARO 5 MG/0.5ML Pen Inject 10 mg into the skin once a week. 01/20/23   [provider]  simvastatin  (ZOCOR ) 40 MG tablet Take 40 mg by mouth at bedtime. 02/01/18   [provider]  Spacer/Aero-Holding Chambers (AEROCHAMBER PLUS) inhaler Use as instructed 01/18/19   Van Knee, MD  tiZANidine  (ZANAFLEX ) 4 MG tablet Take 4 mg by mouth 2 (two) times daily  as needed for muscle spasms. 10/15/23   [provider]  TRELEGY ELLIPTA 100-62.5-25 MCG/ACT AEPB Inhale 1 puff into the lungs daily as needed (shortness of breath). 01/18/23   [provider]  TRUE METRIX BLOOD GLUCOSE TEST test strip 1 each 3 (three) times daily. 06/29/23   [provider]  TRUEplus Lancets 33G MISC Apply 1 each topically 3 (three) times daily. 07/02/23   [provider]  VITAMIN D PO Take 1 tablet by mouth daily.    [provider]    Family History Family History  Problem Relation Age of Onset   Pancreatic cancer Mother    CAD Mother        Open heart surgery 60s-70s   Stroke Father    Heart disease Sister        Unclear details    Social History Social History   Tobacco Use   Smoking status: Never   Smokeless tobacco: Never  Vaping Use   Vaping status: Never Used  Substance Use Topics   Alcohol  use: No   Drug use: No     Allergies   Ace inhibitors, Lisinopril, Augmentin [amoxicillin -pot clavulanate], Mushroom extract complex (obsolete), and Tetanus toxoid-containing vaccines   Review of Systems Review of Systems  Constitutional:  Positive for activity change. Negative for appetite change, fatigue and fever.  HENT:  Positive for dental problem. Negative for trouble swallowing and voice change.   Respiratory:  Negative for shortness of breath.   Gastrointestinal:  Positive for nausea. Negative for vomiting.     Physical Exam Triage Vital Signs ED Triage Vitals  Encounter Vitals Group     BP 11/03/24 0838 111/70     Girls Systolic BP Percentile --      Girls Diastolic BP Percentile --      Boys Systolic BP Percentile --      Boys Diastolic BP Percentile --      Pulse Rate 11/03/24 0838 67     Resp 11/03/24 0838 16     Temp 11/03/24 0838 98.3 F (36.8 C)  Temp Source 11/03/24 0838 Oral     SpO2 11/03/24 0838 96 %     Weight --      Height --      Head Circumference --      Peak Flow --       Pain Score 11/03/24 0836 7     Pain Loc --      Pain Education --      Exclude from Growth Chart --    No data found.  Updated Vital Signs BP 111/70 (BP Location: Left Arm)   Pulse 67   Temp 98.3 F (36.8 C) (Oral)   Resp 16   SpO2 96%   Visual Acuity Right Eye Distance:   Left Eye Distance:   Bilateral Distance:    Right Eye Near:   Left Eye Near:    Bilateral Near:     Physical Exam Vitals reviewed.  Constitutional:      General: She is awake. She is not in acute distress.    Appearance: Normal appearance. She is well-developed. She is not ill-appearing.     Comments: Very pleasant female appears stated age in no acute distress sitting comfortably in exam room  HENT:     Head: Normocephalic and atraumatic.     Mouth/Throat:     Dentition: Abnormal dentition. Gingival swelling and dental abscesses present.     Pharynx: Uvula midline. No oropharyngeal exudate or posterior oropharyngeal erythema.     Comments: No evidence of Ludwig angina on physical exam. Cardiovascular:     Rate and Rhythm: Normal rate and regular rhythm.     Heart sounds: Normal heart sounds, S1 normal and S2 normal. No murmur heard. Pulmonary:     Effort: Pulmonary effort is normal.     Breath sounds: Normal breath sounds. No wheezing, rhonchi or rales.     Comments: Clear to auscultation bilaterally Psychiatric:        Behavior: Behavior is cooperative.      UC Treatments / Results  Labs (all labs ordered are listed, but only abnormal results are displayed) Labs Reviewed - No data to display  EKG   Radiology No results found.  Procedures Procedures (including critical care time)  Medications Ordered in UC Medications - No data to display  Initial Impression / Assessment and Plan / UC Course  I have reviewed the triage vital signs and the nursing notes.  Pertinent labs & imaging results that were available during my care of the patient were reviewed by me and considered in my  medical decision making (see chart for details).     Patient is well-appearing, afebrile, nontoxic, nontachycardic.  No indication for emergent evaluation or imaging.  Patient has an allergy to Augmentin and so was given clindamycin  3 times daily x 10 days.  No indication for dose adjustment based on metabolic panel from January 2025 with a creatinine of 1.95 and calculated creatinine clearance of 34 mL/min.  Patient can use Tylenol  for breakthrough pain.  Recommend she gargle with warm salt water  for additional symptom relief.  Discussed that ultimately she will need to see dentist to address underlying tooth.  She was provided low-cost dental resources in the area with after visit summary.  If she develops any worsening symptoms including fever, nausea, vomiting, swelling of her throat, muffled voice, dysphagia she needs to go to the emergency room to which she expressed understanding.     Final Clinical Impressions(s) / UC Diagnoses   Final diagnoses:  Dental infection  Pain, dental     Discharge Instructions      Start clindamycin  3 times daily for 10 days.  Take this medication with food to help avoid an upset stomach.  If you develop any severe diarrhea stop the medication and be seen immediately.  You can use Tylenol  for breakthrough pain.  Gargle with warm salt water  for additional symptom relief.  You should follow-up with dentist; call to schedule an appointment.  If you develop any worsening symptoms including difficulty swallowing, difficulty speaking, swelling of your throat, high fever, change in your voice you need to be seen immediately.      ED Prescriptions     Medication Sig Dispense Auth. Provider   clindamycin  (CLEOCIN ) 300 MG capsule Take 1 capsule (300 mg total) by mouth 3 (three) times daily. 30 capsule Caitlinn Klinker K, PA-C      PDMP not reviewed this encounter.   Sherrell Rocky POUR, PA-C 11/03/24 [id number]

## 2024-11-03 NOTE — Discharge Instructions (Signed)
 Start clindamycin  3 times daily for 10 days.  Take this medication with food to help avoid an upset stomach.  If you develop any severe diarrhea stop the medication and be seen immediately.  You can use Tylenol  for breakthrough pain.  Gargle with warm salt water  for additional symptom relief.  You should follow-up with dentist; call to schedule an appointment.  If you develop any worsening symptoms including difficulty swallowing, difficulty speaking, swelling of your throat, high fever, change in your voice you need to be seen immediately.

## 2024-11-21 ENCOUNTER — Other Ambulatory Visit: Payer: Self-pay | Admitting: Internal Medicine

## 2024-11-21 DIAGNOSIS — Z1231 Encounter for screening mammogram for malignant neoplasm of breast: Secondary | ICD-10-CM

## 2024-12-09 ENCOUNTER — Ambulatory Visit (HOSPITAL_COMMUNITY)
Admission: EM | Admit: 2024-12-09 | Discharge: 2024-12-09 | Disposition: A | Discharge status: Home / Self Care | Attending: Emergency Medicine | Admitting: Emergency Medicine

## 2024-12-09 ENCOUNTER — Encounter (HOSPITAL_COMMUNITY): Payer: Self-pay

## 2024-12-09 ENCOUNTER — Ambulatory Visit (HOSPITAL_COMMUNITY)

## 2024-12-09 DIAGNOSIS — U071 COVID-19: Secondary | ICD-10-CM | POA: Diagnosis not present

## 2024-12-09 DIAGNOSIS — R051 Acute cough: Secondary | ICD-10-CM

## 2024-12-09 LAB — POC COVID19/FLU A&B COMBO
Covid Antigen, POC: POSITIVE — AB
Influenza A Antigen, POC: NEGATIVE
Influenza B Antigen, POC: NEGATIVE

## 2024-12-09 MED ORDER — BENZONATATE 100 MG PO CAPS: 100.0000 mg | ORAL_CAPSULE | Freq: Three times a day (TID) | ORAL | 0 refills | Status: AC

## 2024-12-09 MED ORDER — ALBUTEROL SULFATE HFA 108 (90 BASE) MCG/ACT IN AERS: 1.0000 | INHALATION_SPRAY | Freq: Four times a day (QID) | RESPIRATORY_TRACT | 0 refills | Status: AC | PRN

## 2024-12-09 NOTE — ED Triage Notes (Signed)
 Patient presents to the office for cough,fever and body aches x 1 week.

## 2024-12-09 NOTE — Discharge Instructions (Addendum)
 Your chest x-ray did not reveal any underlying pneumonia.  You did test positive for COVID today.  I believe your symptoms are likely related to this. I have prescribed Tessalon  that you can take every 8 hours as needed for cough. You can continue to take Coricidin for cough and congestion. I prescribed albuterol  inhaler that you can use 1 to 2 puffs every 6 hours as needed for wheezing or shortness of breath. Follow-up with your primary care provider or return here as needed. If you develop worsening shortness of breath, severe chest pain, persistent fevers, or severe weakness please seek immediate medical treatment in the emergency department.

## 2024-12-09 NOTE — ED Provider Notes (Signed)
 " [facility]    CSN: [id number] Arrival date & time: 12/09/24  9076      History   Chief Complaint No chief complaint on file.   HPI Cheryl Gill is a 73 y.o. female.   Patient presents with cough, congestion, body aches, mild shortness of breath, and chest discomfort with coughing that began on 12/22.  Denies nausea, vomiting, diarrhea, and abdominal pain.  Denies any known fever.  Denies any known sick exposures.  Patient reports that she has had COVID in the past and recovered well from this and did not develop any pneumonia or become hospitalized related to COVID.  Past medical history includes asthma, type 2 diabetes, hypertension, CKD (latest GFR from 12/2023 was 27), CAD, CABG x 2, and hyperlipidemia.  The history is provided by the patient and medical records.    Past Medical History:  Diagnosis Date   Acute gout 06/19/2018   Anaphylactic reaction, due to adverse effect of correct medicinal substance properly administered 01/16/2013   Asthma    CAD (coronary artery disease)    a. 12/2014: NSTEMI with cath showing Ostial LAD ~95%, mLAD 80-90%; EF ~40-45%, LAD disease compromised major D1 branch. CABG recommended and performed with LIMA-LAD and SVG-D1.    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   LIMA-LAD, SVG-D1   Seasonal allergies     Patient Active Problem List   Diagnosis Date Noted   Pyelonephritis 12/13/2023   Acute kidney injury superimposed on stage 3b chronic kidney disease (HCC) - baseline Scr 1.4-1.7 12/13/2023   Type 2 diabetes mellitus with  complication, with long-term current use of insulin  (HCC) 12/13/2023   CKD (chronic kidney disease), stage III (HCC) 06/18/2018   Joint pain 06/18/2018   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   Coronary artery disease involving native coronary artery without angina pectoris 12/19/2014   H/O NSTEMI (non-ST elevated myocardial infarction) (HCC) 12/18/2014   Obesity, Class II, BMI 35-39.9 12/18/2014   Metabolic syndrome: DM, HTN, Obesity as well as HLD 12/18/2014   Frequency of urination 07/17/2014   Nocturia more than twice per night 07/17/2014   Asthma, chronic 01/28/2014   Hyperlipidemia associated with type 2 diabetes mellitus (HCC) 01/28/2014   Normocytic anemia 01/18/2013   Essential hypertension 01/16/2013   Diabetes mellitus type 2, uncontrolled 01/16/2013   Seasonal allergies     Past Surgical History:  Procedure Laterality Date   ABDOMINAL HYSTERECTOMY     CARDIAC SURGERY     CORONARY ARTERY BYPASS GRAFT N/A 12/21/2014   Procedure: CORONARY ARTERY BYPASS GRAFTING (CABG) TIMES TWO USING LEFT INTERNAL MAMMARY AND RIGHT SAPHENOUS LEG VEIN HARVESTED ENDOSCOPICALLY;  Surgeon: Elspeth JAYSON Millers, MD;  Location: [facility] OR;  Service: Open Heart Surgery;  Laterality: N/A;   LEFT HEART CATHETERIZATION WITH CORONARY ANGIOGRAM N/A 12/18/2014   Procedure: LEFT HEART CATHETERIZATION WITH CORONARY ANGIOGRAM;  Surgeon: Peter M Jordan, MD;  Location: [facility] CATH LAB;  Service: Cardiovascular;  Laterality: N/A;   NM MYOCAR PERF WALL MOTION  04/09/2010   Abnormal study -  appears to be a small area of apical infarction. Reversible ischemia is not seen.   NM MYOVIEW  LTD  10/2019   No ischemia or infarction.  Small apical defect likely artifact.  LOW RISK.  EF 60 to 65%.   TEE WITHOUT CARDIOVERSION N/A 12/21/2014   Procedure: TRANSESOPHAGEAL ECHOCARDIOGRAM (TEE);  Surgeon: Elspeth JAYSON Millers, MD;  Location: [facility] OR;  Service: Open Heart Surgery;  Laterality: N/A;   TONSILLECTOMY     TRANSTHORACIC ECHOCARDIOGRAM   12/2014   LVEF 60-65%, normal wall thickness, normal wall motion, diastolic dysfunction, indeterminate LV filling pressure, normal LA size.    OB History     Gravida  5   Para  4   Term  4   Preterm      AB  1   Living  4      SAB  1   IAB      Ectopic      Multiple      Live Births  4            Home Medications    Prior to Admission medications  Medication Sig Start Date End Date Taking? Authorizing Provider  albuterol  (VENTOLIN  HFA) 108 (90 Base) MCG/ACT inhaler Inhale 1-2 puffs into the lungs every 6 (six) hours as needed for wheezing or shortness of breath. 12/09/24  Yes Johnie, Chesky Heyer A, NP  benzonatate  (TESSALON ) 100 MG capsule Take 1 capsule (100 mg total) by mouth every 8 (eight) hours. 12/09/24  Yes Johnie, Terrisha Lopata A, NP  allopurinol  (ZYLOPRIM ) 300 MG tablet Take 300 mg by mouth daily.    [provider]  APPLE CIDER VINEGAR-GINGER PO Take 1 capsule by mouth daily.    [provider]  Ascorbic Acid (VITAMIN C PO) Take 1 tablet by mouth daily.    [provider]  aspirin  EC 81 MG tablet Take 81 mg by mouth at bedtime.    [provider]  b complex vitamins capsule Take 1 capsule by mouth daily.    [provider]  Blood Glucose Monitoring Suppl (TRUE METRIX AIR GLUCOSE METER) w/Device KIT  12/28/21   [provider]  carboxymethylcellulose 1 % ophthalmic solution Place 1 drop into the left eye at bedtime.    [provider]  cetirizine  (ZYRTEC ) 10 MG tablet Take 10 mg by mouth daily.    [provider]  clindamycin  (CLEOCIN ) 300 MG capsule Take 1 capsule (300 mg total) by mouth 3 (three) times daily. 11/03/24   Raspet, Erin K, PA-C  [Paused] colchicine  0.6 MG tablet Take 0.5 tablets (0.3 mg total) by mouth 2 (two) times daily. Patient taking differently: Take 0.3 mg by mouth 2 (two) times daily as needed (gout flares). Wait to take this until your doctor or other care provider tells  you to start again. 06/20/18   Danford, Lonni SQUIBB, MD  DROPLET PEN NEEDLES 32G X 4 MM MISC  12/28/21   [provider]  ELDERBERRY PO Take 1 capsule by mouth daily.    [provider]  fluticasone  (FLONASE ) 50 MCG/ACT nasal spray Place 1 spray into both nostrils daily for 3 days. Patient taking differently: Place 1 spray into both nostrils daily as needed for allergies. 07/25/22 05/04/24  Hazen Darryle BRAVO, FNP  gabapentin  (NEURONTIN ) 100 MG capsule Take 100 mg by mouth 2 (two) times daily. 11/16/23   [provider]  glipiZIDE  (GLUCOTROL ) 10 MG tablet Take 10 mg by mouth daily before breakfast.    [provider]  [Paused] hydrochlorothiazide  (HYDRODIURIL ) 25 MG tablet Take 25 mg by mouth daily. Wait to take this until your doctor or other care provider tells you to start again.    [provider]  LANTUS  SOLOSTAR 100 UNIT/ML Solostar Pen Inject 35-70 Units into the skin See admin instructions. Inject 70 units into the skin at bedtime if blood sugar over 200. If blood sugar is over 134, inject 35 units. If blood sugar is lower, do not use. 03/15/18   [provider]  latanoprost  (XALATAN ) 0.005 % ophthalmic solution Place 1 drop into the right eye at bedtime. 10/07/23   [provider]  lidocaine  (LIDODERM ) 5 % Place 1 patch onto the skin daily. Remove & Discard patch within 12 hours or as directed by MD 09/11/24   Raspet, Rocky K, PA-C  metoprolol  tartrate (LOPRESSOR ) 25 MG tablet Take 1.5 tablets (37.5 mg total) by mouth 2 (two) times daily. Patient taking differently: Take 25 mg by mouth 2 (two) times daily. 08/05/23 07/07/24  Meng, Hao, PA  MOUNJARO 5 MG/0.5ML Pen Inject 10 mg into the skin once a week. 01/20/23   [provider]  simvastatin  (ZOCOR ) 40 MG tablet Take 40 mg by mouth at bedtime. 02/01/18   [provider]  Spacer/Aero-Holding Chambers (AEROCHAMBER PLUS) inhaler Use as instructed 01/18/19   Van Knee, MD   tiZANidine  (ZANAFLEX ) 4 MG tablet Take 4 mg by mouth 2 (two) times daily as needed for muscle spasms. 10/15/23   [provider]  TRELEGY ELLIPTA 100-62.5-25 MCG/ACT AEPB Inhale 1 puff into the lungs daily as needed (shortness of breath). 01/18/23   [provider]  TRUE METRIX BLOOD GLUCOSE TEST test strip 1 each 3 (three) times daily. 06/29/23   [provider]  TRUEplus Lancets 33G MISC Apply 1 each topically 3 (three) times daily. 07/02/23   [provider]  VITAMIN D PO Take 1 tablet by mouth daily.    [provider]    Family History Family History  Problem Relation Age of Onset   Pancreatic cancer Mother    CAD Mother        Open heart surgery 60s-70s   Stroke Father    Heart disease Sister        Unclear details    Social History Social History[1]   Allergies   Ace inhibitors, Lisinopril, Augmentin [amoxicillin -pot clavulanate], Mushroom extract complex (obsolete), and Tetanus toxoid-containing vaccines   Review of Systems Review of Systems  Per HPI  Physical Exam Triage Vital Signs ED Triage Vitals [12/09/24 1145]  Encounter Vitals Group     BP (!) 157/91     Girls Systolic BP Percentile      Girls Diastolic BP Percentile      Boys Systolic BP Percentile      Boys Diastolic BP Percentile      Pulse Rate 72     Resp 18     Temp 98.5 F (36.9 C)     Temp Source Oral     SpO2 97 %     Weight      Height      Head Circumference      Peak Flow      Pain Score      Pain Loc      Pain Education      Exclude from Growth Chart    No data found.  Updated Vital Signs BP (!) 157/91 (BP Location: Left Arm)   Pulse 72  Temp 98.5 F (36.9 C) (Oral)   Resp 18   SpO2 97%   Visual Acuity Right Eye Distance:   Left Eye Distance:   Bilateral Distance:    Right Eye Near:   Left Eye Near:    Bilateral Near:     Physical Exam Vitals and nursing note reviewed.  Constitutional:      General: She is awake. She is  not in acute distress.    Appearance: Normal appearance. She is well-developed and well-groomed. She is not ill-appearing.  HENT:     Right Ear: Tympanic membrane, ear canal and external ear normal.     Left Ear: Tympanic membrane, ear canal and external ear normal.     Nose: Congestion and rhinorrhea present.     Mouth/Throat:     Mouth: Mucous membranes are moist.     Pharynx: Posterior oropharyngeal erythema and postnasal drip present. No oropharyngeal exudate.  Cardiovascular:     Rate and Rhythm: Normal rate and regular rhythm.  Pulmonary:     Effort: Pulmonary effort is normal.     Breath sounds: Normal breath sounds.  Skin:    General: Skin is warm and dry.  Neurological:     General: No focal deficit present.     Mental Status: She is alert and oriented to person, place, and time. Mental status is at baseline.  Psychiatric:        Behavior: Behavior is cooperative.      UC Treatments / Results  Labs (all labs ordered are listed, but only abnormal results are displayed) Labs Reviewed  POC COVID19/FLU A&B COMBO - Abnormal; Notable for the following components:      Result Value   Covid Antigen, POC Positive (*)    All other components within normal limits    EKG   Radiology DG Chest 2 View Result Date: 12/09/2024 CLINICAL DATA:  Cough with fever and body aches x1 week. EXAM: CHEST - 2 VIEW COMPARISON:  September 11, 2024 FINDINGS: Multiple sternal wires and vascular clips are seen. The heart size and mediastinal contours are within normal limits. Both lungs are clear. The visualized skeletal structures are unremarkable. IMPRESSION: 1. Evidence of prior median sternotomy/CABG. 2. No acute cardiopulmonary disease. Electronically Signed   By: Suzen Dials M.D.   On: 12/09/2024 13:31    Procedures Procedures (including critical care time)  Medications Ordered in UC Medications - No data to display  Initial Impression / Assessment and Plan / UC Course  I have  reviewed the triage vital signs and the nursing notes.  Pertinent labs & imaging results that were available during my care of the patient were reviewed by me and considered in my medical decision making (see chart for details).     Patient is overall well-appearing.  Vitals are stable.  Tested positive for COVID today.  Chest x-ray ordered to rule out development of pneumonia with COVID due to presence of mild shortness of breath and some chest discomfort with coughing.  I independently interpreted these images and there is no active cardiopulmonary disease at this time.  Radiology report confirms this.  Patient is outside the window for Paxlovid treatment and GFR is 27 therefore she does not qualify for this at this time.  Prescribed Tessalon  as needed for cough.  Discussed over-the-counter medications as needed for cough.  Prescribed albuterol  inhaler to use as needed for shortness of breath and wheezing.  Discussed follow-up, return, and strict ER precautions. Final Clinical Impressions(s) / UC  Diagnoses   Final diagnoses:  Acute cough  COVID-19     Discharge Instructions      Your chest x-ray did not reveal any underlying pneumonia.  You did test positive for COVID today.  I believe your symptoms are likely related to this. I have prescribed Tessalon  that you can take every 8 hours as needed for cough. You can continue to take Coricidin for cough and congestion. I prescribed albuterol  inhaler that you can use 1 to 2 puffs every 6 hours as needed for wheezing or shortness of breath. Follow-up with your primary care provider or return here as needed. If you develop worsening shortness of breath, severe chest pain, persistent fevers, or severe weakness please seek immediate medical treatment in the emergency department.     ED Prescriptions     Medication Sig Dispense Auth. Provider   albuterol  (VENTOLIN  HFA) 108 (90 Base) MCG/ACT inhaler Inhale 1-2 puffs into the lungs every 6  (six) hours as needed for wheezing or shortness of breath. 17 g Johnie Flaming A, NP   benzonatate  (TESSALON ) 100 MG capsule Take 1 capsule (100 mg total) by mouth every 8 (eight) hours. 21 capsule Johnie Flaming A, NP      PDMP not reviewed this encounter.     [1]  Social History Tobacco Use   Smoking status: Never   Smokeless tobacco: Never  Vaping Use   Vaping status: Never Used  Substance Use Topics   Alcohol  use: No   Drug use: No     Johnie Flaming LABOR, NP 12/09/24 1407  "

## 2024-12-11 ENCOUNTER — Ambulatory Visit (HOSPITAL_COMMUNITY): Payer: Self-pay

## 2025-01-03 ENCOUNTER — Ambulatory Visit
Admission: RE | Admit: 2025-01-03 | Discharge: 2025-01-03 | Disposition: A | Source: Ambulatory Visit | Attending: Internal Medicine | Admitting: Internal Medicine

## 2025-01-03 DIAGNOSIS — Z1231 Encounter for screening mammogram for malignant neoplasm of breast: Secondary | ICD-10-CM
